# Patient Record
Sex: Female | Born: 1945 | Race: Black or African American | Hispanic: No | Smoking: Former smoker
Health system: Southern US, Community
[De-identification: ages and names within clinical notes are randomized; demographics above are authoritative.]

## PROBLEM LIST (undated history)

## (undated) DIAGNOSIS — I219 Acute myocardial infarction, unspecified: Secondary | ICD-10-CM

## (undated) DIAGNOSIS — I2510 Atherosclerotic heart disease of native coronary artery without angina pectoris: Secondary | ICD-10-CM

## (undated) DIAGNOSIS — I10 Essential (primary) hypertension: Secondary | ICD-10-CM

## (undated) DIAGNOSIS — I5020 Unspecified systolic (congestive) heart failure: Secondary | ICD-10-CM

## (undated) DIAGNOSIS — U071 COVID-19: Secondary | ICD-10-CM

## (undated) DIAGNOSIS — M359 Systemic involvement of connective tissue, unspecified: Secondary | ICD-10-CM

## (undated) DIAGNOSIS — R600 Localized edema: Secondary | ICD-10-CM

## (undated) DIAGNOSIS — I509 Heart failure, unspecified: Secondary | ICD-10-CM

## (undated) DIAGNOSIS — R0600 Dyspnea, unspecified: Secondary | ICD-10-CM

## (undated) DIAGNOSIS — K219 Gastro-esophageal reflux disease without esophagitis: Secondary | ICD-10-CM

## (undated) DIAGNOSIS — J45909 Unspecified asthma, uncomplicated: Secondary | ICD-10-CM

## (undated) DIAGNOSIS — M069 Rheumatoid arthritis, unspecified: Secondary | ICD-10-CM

## (undated) DIAGNOSIS — R7303 Prediabetes: Secondary | ICD-10-CM

## (undated) DIAGNOSIS — G4733 Obstructive sleep apnea (adult) (pediatric): Secondary | ICD-10-CM

## (undated) DIAGNOSIS — I700 Atherosclerosis of aorta: Secondary | ICD-10-CM

## (undated) HISTORY — DX: Heart failure, unspecified: I50.9

## (undated) HISTORY — PX: ESOPHAGOGASTRODUODENOSCOPY: SHX1529

## (undated) HISTORY — PX: TUBAL LIGATION: SHX77

## (undated) HISTORY — PX: COLONOSCOPY: SHX174

## (undated) HISTORY — PX: BREAST SURGERY: SHX581

## (undated) HISTORY — PX: CHOLECYSTECTOMY: SHX55

---

## 2004-09-28 ENCOUNTER — Ambulatory Visit: Payer: Self-pay | Admitting: Unknown Physician Specialty

## 2004-12-06 ENCOUNTER — Emergency Department: Payer: Self-pay | Admitting: Emergency Medicine

## 2005-03-03 ENCOUNTER — Emergency Department: Payer: Self-pay | Admitting: Emergency Medicine

## 2005-04-29 ENCOUNTER — Emergency Department: Payer: Self-pay | Admitting: Emergency Medicine

## 2005-04-29 ENCOUNTER — Other Ambulatory Visit: Payer: Self-pay

## 2005-06-21 ENCOUNTER — Ambulatory Visit: Payer: Self-pay | Admitting: Cardiovascular Disease

## 2005-06-21 HISTORY — PX: CARDIAC CATHETERIZATION: SHX172

## 2006-03-06 ENCOUNTER — Ambulatory Visit: Payer: Self-pay | Admitting: Family Medicine

## 2006-08-01 ENCOUNTER — Ambulatory Visit: Payer: Self-pay | Admitting: Rheumatology

## 2007-04-02 ENCOUNTER — Ambulatory Visit: Payer: Self-pay | Admitting: Specialist

## 2007-10-25 ENCOUNTER — Ambulatory Visit: Payer: Self-pay | Admitting: Family Medicine

## 2007-11-05 ENCOUNTER — Ambulatory Visit: Payer: Self-pay | Admitting: Specialist

## 2007-12-04 ENCOUNTER — Ambulatory Visit: Payer: Self-pay | Admitting: Unknown Physician Specialty

## 2008-05-15 ENCOUNTER — Ambulatory Visit: Payer: Self-pay | Admitting: Specialist

## 2008-05-19 ENCOUNTER — Inpatient Hospital Stay: Payer: Self-pay | Admitting: Internal Medicine

## 2008-05-19 ENCOUNTER — Other Ambulatory Visit: Payer: Self-pay

## 2008-05-20 HISTORY — PX: CARDIAC CATHETERIZATION: SHX172

## 2008-12-16 ENCOUNTER — Ambulatory Visit: Payer: Self-pay | Admitting: Family Medicine

## 2009-12-20 ENCOUNTER — Ambulatory Visit: Payer: Self-pay | Admitting: Family Medicine

## 2010-04-05 ENCOUNTER — Ambulatory Visit: Payer: Self-pay | Admitting: Cardiovascular Disease

## 2010-04-05 ENCOUNTER — Observation Stay: Payer: Self-pay | Admitting: Internal Medicine

## 2010-11-25 ENCOUNTER — Ambulatory Visit: Payer: Self-pay | Admitting: Family Medicine

## 2011-03-05 DEATH — deceased

## 2011-06-09 DIAGNOSIS — I10 Essential (primary) hypertension: Secondary | ICD-10-CM | POA: Insufficient documentation

## 2011-06-09 DIAGNOSIS — M069 Rheumatoid arthritis, unspecified: Secondary | ICD-10-CM | POA: Insufficient documentation

## 2011-06-09 DIAGNOSIS — J449 Chronic obstructive pulmonary disease, unspecified: Secondary | ICD-10-CM | POA: Insufficient documentation

## 2011-07-25 ENCOUNTER — Ambulatory Visit: Payer: Self-pay | Admitting: Family Medicine

## 2012-01-16 ENCOUNTER — Emergency Department: Payer: Self-pay | Admitting: *Deleted

## 2012-01-16 LAB — BASIC METABOLIC PANEL
Anion Gap: 11 (ref 7–16)
Calcium, Total: 8 mg/dL — ABNORMAL LOW (ref 8.5–10.1)
Creatinine: 0.93 mg/dL (ref 0.60–1.30)
EGFR (African American): >60
EGFR (Non-African Amer.): >60
Glucose: 137 mg/dL — ABNORMAL HIGH (ref 65–99)
Osmolality: 288 (ref 275–301)
Sodium: 143 mmol/L (ref 136–145)

## 2012-01-16 LAB — TROPONIN I: Troponin-I: <0.02 ng/mL

## 2012-01-16 LAB — CBC WITH DIFFERENTIAL/PLATELET
Basophil #: 0 10*3/uL (ref 0.0–0.1)
Basophil %: 0.4 %
Eosinophil %: 6 %
HGB: 13.3 g/dL (ref 12.0–16.0)
Lymphocyte #: 1.7 10*3/uL (ref 1.0–3.6)
MCH: 31.2 pg (ref 26.0–34.0)
MCHC: 33.8 g/dL (ref 32.0–36.0)
MCV: 92 fL (ref 80–100)
Monocyte #: 0.7 x10 3/mm (ref 0.2–0.9)
Monocyte %: 11.1 %
Platelet: 240 10*3/uL (ref 150–440)
RBC: 4.27 10*6/uL (ref 3.80–5.20)
RDW: 13.9 % (ref 11.5–14.5)

## 2012-01-16 LAB — URINALYSIS, COMPLETE
Glucose,UR: NEGATIVE mg/dL (ref 0–75)
Leukocyte Esterase: NEGATIVE
Nitrite: NEGATIVE
Ph: 6 (ref 4.5–8.0)
Protein: NEGATIVE
Specific Gravity: 1.009 (ref 1.003–1.030)
Squamous Epithelial: <1

## 2012-01-16 LAB — CK TOTAL AND CKMB (NOT AT ARMC): CK-MB: 1.6 ng/mL (ref 0.5–3.6)

## 2012-08-10 ENCOUNTER — Emergency Department: Payer: Self-pay | Admitting: Internal Medicine

## 2012-08-20 ENCOUNTER — Ambulatory Visit: Payer: Self-pay | Admitting: Family Medicine

## 2013-03-15 ENCOUNTER — Emergency Department: Payer: Self-pay | Admitting: Emergency Medicine

## 2013-03-15 LAB — COMPREHENSIVE METABOLIC PANEL
Albumin: 3.1 g/dL — ABNORMAL LOW (ref 3.4–5.0)
Alkaline Phosphatase: 108 U/L (ref 50–136)
Anion Gap: 4 — ABNORMAL LOW (ref 7–16)
Bilirubin,Total: 0.3 mg/dL (ref 0.2–1.0)
Chloride: 107 mmol/L (ref 98–107)
Creatinine: 1.05 mg/dL (ref 0.60–1.30)
EGFR (Non-African Amer.): 54 — ABNORMAL LOW
Osmolality: 285 (ref 275–301)
Potassium: 3.5 mmol/L (ref 3.5–5.1)
SGOT(AST): 18 U/L (ref 15–37)
SGPT (ALT): 16 U/L (ref 12–78)
Sodium: 141 mmol/L (ref 136–145)
Total Protein: 7.7 g/dL (ref 6.4–8.2)

## 2013-03-15 LAB — SEDIMENTATION RATE: Erythrocyte Sed Rate: 47 mm/hr — ABNORMAL HIGH (ref 0–30)

## 2013-03-15 LAB — TROPONIN I: Troponin-I: <0.02 ng/mL

## 2013-03-15 LAB — CBC
HCT: 38.9 % (ref 35.0–47.0)
HGB: 13.1 g/dL (ref 12.0–16.0)
MCH: 30.2 pg (ref 26.0–34.0)
MCV: 90 fL (ref 80–100)
Platelet: 246 10*3/uL (ref 150–440)
RBC: 4.33 10*6/uL (ref 3.80–5.20)
WBC: 6.5 10*3/uL (ref 3.6–11.0)

## 2013-03-15 LAB — CK TOTAL AND CKMB (NOT AT ARMC): CK-MB: 1.3 ng/mL (ref 0.5–3.6)

## 2013-07-09 ENCOUNTER — Ambulatory Visit: Payer: Self-pay | Admitting: Family Medicine

## 2014-05-21 DIAGNOSIS — R911 Solitary pulmonary nodule: Secondary | ICD-10-CM | POA: Insufficient documentation

## 2015-01-09 ENCOUNTER — Encounter: Payer: Self-pay | Admitting: *Deleted

## 2015-01-09 ENCOUNTER — Emergency Department: Payer: Medicare Other

## 2015-01-09 ENCOUNTER — Other Ambulatory Visit: Payer: Self-pay

## 2015-01-09 DIAGNOSIS — N3000 Acute cystitis without hematuria: Secondary | ICD-10-CM | POA: Insufficient documentation

## 2015-01-09 DIAGNOSIS — Z91040 Latex allergy status: Secondary | ICD-10-CM | POA: Diagnosis not present

## 2015-01-09 DIAGNOSIS — R200 Anesthesia of skin: Secondary | ICD-10-CM | POA: Diagnosis present

## 2015-01-09 DIAGNOSIS — R202 Paresthesia of skin: Secondary | ICD-10-CM | POA: Diagnosis not present

## 2015-01-09 DIAGNOSIS — M25512 Pain in left shoulder: Secondary | ICD-10-CM | POA: Diagnosis not present

## 2015-01-09 DIAGNOSIS — Z87891 Personal history of nicotine dependence: Secondary | ICD-10-CM | POA: Diagnosis not present

## 2015-01-09 DIAGNOSIS — I10 Essential (primary) hypertension: Secondary | ICD-10-CM | POA: Diagnosis not present

## 2015-01-09 DIAGNOSIS — M546 Pain in thoracic spine: Secondary | ICD-10-CM | POA: Diagnosis not present

## 2015-01-09 DIAGNOSIS — M542 Cervicalgia: Secondary | ICD-10-CM | POA: Diagnosis not present

## 2015-01-09 LAB — DIFFERENTIAL
Basophils Absolute: 0 10*3/uL (ref 0–0.1)
Basophils Relative: 1 %
Eosinophils Absolute: 0.3 10*3/uL (ref 0–0.7)
Eosinophils Relative: 5 %
LYMPHS ABS: 1.4 10*3/uL (ref 1.0–3.6)
Lymphocytes Relative: 25 %
MONO ABS: 0.6 10*3/uL (ref 0.2–0.9)
MONOS PCT: 10 %
NEUTROS ABS: 3.5 10*3/uL (ref 1.4–6.5)
NEUTROS PCT: 59 %

## 2015-01-09 LAB — URINALYSIS COMPLETE WITH MICROSCOPIC (ARMC ONLY)
Bilirubin Urine: NEGATIVE
Glucose, UA: NEGATIVE mg/dL
KETONES UR: NEGATIVE mg/dL
NITRITE: NEGATIVE
PROTEIN: 30 mg/dL — AB
SPECIFIC GRAVITY, URINE: 1.025 (ref 1.005–1.030)
pH: 5 (ref 5.0–8.0)

## 2015-01-09 LAB — APTT: aPTT: 28 seconds (ref 24–36)

## 2015-01-09 LAB — COMPREHENSIVE METABOLIC PANEL
ALBUMIN: 3.6 g/dL (ref 3.5–5.0)
ALT: 17 U/L (ref 14–54)
ANION GAP: 8 (ref 5–15)
AST: 21 U/L (ref 15–41)
Alkaline Phosphatase: 113 U/L (ref 38–126)
BILIRUBIN TOTAL: 0.4 mg/dL (ref 0.3–1.2)
BUN: 19 mg/dL (ref 6–20)
CALCIUM: 8.9 mg/dL (ref 8.9–10.3)
CO2: 27 mmol/L (ref 22–32)
Chloride: 104 mmol/L (ref 101–111)
Creatinine, Ser: 0.87 mg/dL (ref 0.44–1.00)
GFR calc Af Amer: >60 mL/min (ref >60)
GFR calc non Af Amer: >60 mL/min (ref >60)
Glucose, Bld: 122 mg/dL — ABNORMAL HIGH (ref 65–99)
Potassium: 3.7 mmol/L (ref 3.5–5.1)
Sodium: 139 mmol/L (ref 135–145)
Total Protein: 7.8 g/dL (ref 6.5–8.1)

## 2015-01-09 LAB — TROPONIN I: Troponin I: <0.03 ng/mL (ref <0.031)

## 2015-01-09 LAB — CBC
HCT: 40.2 % (ref 35.0–47.0)
Hemoglobin: 13.4 g/dL (ref 12.0–16.0)
MCH: 30.3 pg (ref 26.0–34.0)
MCHC: 33.3 g/dL (ref 32.0–36.0)
MCV: 90.9 fL (ref 80.0–100.0)
PLATELETS: 237 10*3/uL (ref 150–440)
RBC: 4.42 MIL/uL (ref 3.80–5.20)
RDW: 14.4 % (ref 11.5–14.5)
WBC: 5.8 10*3/uL (ref 3.6–11.0)

## 2015-01-09 LAB — PROTIME-INR
INR: 0.91
Prothrombin Time: 12.5 seconds (ref 11.4–15.0)

## 2015-01-09 NOTE — ED Notes (Signed)
Pt woke w/ L sided facial numbness x 4 days ago. Pt states numbness has been intermittent until today. Pt's sensation to light pressure intact, smile even, no tongue deviation, grips and push-pulls equal and strong. No evidence of neglect. Speech is clear and sensible.

## 2015-01-10 ENCOUNTER — Emergency Department
Admission: EM | Admit: 2015-01-10 | Discharge: 2015-01-10 | Disposition: A | Discharge status: Home / Self Care | Payer: Medicare Other | Attending: Emergency Medicine | Admitting: Emergency Medicine

## 2015-01-10 DIAGNOSIS — R202 Paresthesia of skin: Secondary | ICD-10-CM

## 2015-01-10 DIAGNOSIS — N3000 Acute cystitis without hematuria: Secondary | ICD-10-CM

## 2015-01-10 HISTORY — DX: Essential (primary) hypertension: I10

## 2015-01-10 HISTORY — DX: Unspecified asthma, uncomplicated: J45.909

## 2015-01-10 HISTORY — DX: Localized edema: R60.0

## 2015-01-10 HISTORY — DX: Gastro-esophageal reflux disease without esophagitis: K21.9

## 2015-01-10 MED ORDER — CIPROFLOXACIN HCL 500 MG PO TABS
ORAL_TABLET | ORAL | Status: AC
  Filled 2015-01-10: qty 1

## 2015-01-10 MED ORDER — CIPROFLOXACIN HCL 500 MG PO TABS
500.0000 mg | ORAL_TABLET | Freq: Once | ORAL | Status: AC
  Administered 2015-01-10: 500 mg via ORAL

## 2015-01-10 MED ORDER — CIPROFLOXACIN HCL 500 MG PO TABS: 500.0000 mg | ORAL_TABLET | Freq: Two times a day (BID) | ORAL | Status: AC

## 2015-01-10 NOTE — Discharge Instructions (Signed)
He did have a urinary tract infection. It is not clear if this is contributing to the numbness near face. The numbness in her face may be directly related to the discomfort in your neck and shoulder. You are neurologically intact on exam here and your CT scan looked okay. Take Cipro for urinary tract infection area follow-up with her regular doctor this coming week.  Paresthesia Paresthesia is an abnormal burning or prickling sensation. This sensation is generally felt in the hands, arms, legs, or feet. However, it may occur in any part of the body. It is usually not painful. The feeling may be described as:  Tingling or numbness.  "Pins and needles."  Skin crawling.  Buzzing.  Limbs "falling asleep."  Itching. Most people experience temporary (transient) paresthesia at some time in their lives. CAUSES  Paresthesia may occur when you breathe too quickly (hyperventilation). It can also occur without any apparent cause. Commonly, paresthesia occurs when pressure is placed on a nerve. The feeling quickly goes away once the pressure is removed. For some people, however, paresthesia is a long-lasting (chronic) condition caused by an underlying disorder. The underlying disorder may be:  A traumatic, direct injury to nerves. Examples include a:  Broken (fractured) neck.  Fractured skull.  A disorder affecting the brain and spinal cord (central nervous system). Examples include:  Transverse myelitis.  Encephalitis.  Transient ischemic attack.  Multiple sclerosis.  Stroke.  Tumor or blood vessel problems, such as an arteriovenous malformation pressing against the brain or spinal cord.  A condition that damages the peripheral nerves (peripheral neuropathy). Peripheral nerves are not part of the brain and spinal cord. These conditions include:  Diabetes.  Peripheral vascular disease.  Nerve entrapment syndromes, such as carpal tunnel  syndrome.  Shingles.  Hypothyroidism.  Vitamin B12 deficiencies.  Alcoholism.  Heavy metal poisoning (lead, arsenic).  Rheumatoid arthritis.  Systemic lupus erythematosus. DIAGNOSIS  Your caregiver will attempt to find the underlying cause of your paresthesia. Your caregiver may:  Take your medical history.  Perform a physical exam.  Order various lab tests.  Order imaging tests. TREATMENT  Treatment for paresthesia depends on the underlying cause. HOME CARE INSTRUCTIONS  Avoid drinking alcohol.  You may consider massage or acupuncture to help relieve your symptoms.  Keep all follow-up appointments as directed by your caregiver. SEEK IMMEDIATE MEDICAL CARE IF:   You feel weak.  You have trouble walking or moving.  You have problems with speech or vision.  You feel confused.  You cannot control your bladder or bowel movements.  You feel numbness after an injury.  You faint.  Your burning or prickling feeling gets worse when walking.  You have pain, cramps, or dizziness.  You develop a rash. MAKE SURE YOU:  Understand these instructions.  Will watch your condition.  Will get help right away if you are not doing well or get worse. Document Released: 08/11/2002 Document Revised: 11/13/2011 Document Reviewed: 05/12/2011 [facility] Patient Information 2015 [place], [place]. This information is not intended to replace advice given to you by your health care provider. Make sure you discuss any questions you have with your health care provider.

## 2015-01-10 NOTE — ED Notes (Signed)
Pt up ambulatory around room without difficulty. Pt continues to deny facial numbness at this time.

## 2015-01-10 NOTE — ED Provider Notes (Signed)
[facility] Emergency Department Provider Note  ____________________________________________  Time seen: 4 AM  I have reviewed the triage vital signs and the nursing notes.   HISTORY  Chief Complaint Numbness  neck pain, left facial numbness    HPI Tina Fields is a 69 y.o. female who presents complaining of a numbness in her face over the past few days. She reports that initially she was having some pain in her left neck and upper back. She denies any weakness in either face or extremities. There is no facial asymmetry per her report. She has not had experiences like this before.     Past Medical History  Diagnosis Date  . Hypertension   . Acid reflux   . Edema of both legs   . Asthma     There are no active problems to display for this patient.   History reviewed. No pertinent past surgical history.  Current Outpatient Rx  Name  Route  Sig  Dispense  Refill  . ciprofloxacin (CIPRO) 500 MG tablet   Oral   Take 1 tablet (500 mg total) by mouth 2 (two) times daily.   14 tablet   0     Allergies Latex  History reviewed. No pertinent family history.  Social History History  Substance Use Topics  . Smoking status: Former Smoker    Quit date: 01/09/1995  . Smokeless tobacco: Never Used  . Alcohol Use: No    Review of Systems Constitutional: Negative for fever. ENT: Negative for sore throat. Notable for numbness of the face. See history of present illness. Cardiovascular: Negative for chest pain. Respiratory: Negative for shortness of breath. Gastrointestinal: Negative for abdominal pain, vomiting and diarrhea. Genitourinary: Negative for dysuria. Musculoskeletal: Upper left back pain and left neck pain. Skin: Negative for rash. Neurological: Numbness of the face. See history of present illness area 10-point ROS otherwise negative.  ____________________________________________   PHYSICAL EXAM:  VITAL SIGNS: ED  Triage Vitals  Enc Vitals Group     BP 01/09/15 2049 147/74 mmHg     Pulse Rate 01/09/15 2049 76     Resp 01/09/15 2049 20     Temp 01/09/15 2049 98.2 F (36.8 C)     Temp Source 01/09/15 2049 Oral     SpO2 01/09/15 2049 96 %     Weight 01/09/15 2049 216 lb 9.6 oz (98.249 kg)     Height 01/09/15 2049 5\' 6"  (1.676 m)     Head Cir --      Peak Flow --      Pain Score 01/09/15 2050 7     Pain Loc --      Pain Edu? --      Excl. in GC? --     Constitutional: Alert and oriented. Well appearing and in no distress. ENT   Head: Normocephalic and atraumatic.   Nose: No congestion/rhinnorhea.   Mouth/Throat: Mucous membranes are moist. Cardiovascular: Normal rate, regular rhythm. Respiratory: Normal respiratory effort without tachypnea. Breath sounds are clear and equal bilaterally. No wheezes/rales/rhonchi. Gastrointestinal: Soft and nontender. No distention.  Back: There is no CVA tenderness. Musculoskeletal: Nontender with normal range of motion in all extremities.  No noted edema. Neurologic:  Normal speech and language. No gross focal neurologic deficits are appreciated. Cranial nerves II through XII are intact. Negative Romberg, negative pronator drift, normal finger to nose movement. Skin:  Skin is warm, dry. No rash noted. Psychiatric: Mood and affect are normal. Speech and behavior are normal.  ____________________________________________     LABS (pertinent positives/negatives)  Blood tests are unremarkable with white blood cell count 5.8. Electrolytes are normal with normal renal function. Urinalysis is abnormal with white blood cells too numerous to count.  ____________________________________________   EKG  EKG at 2105 shows normal sinus rhythm with one PVC at the beginning of the 12 second strip. Intervals are normal axis is normal. T waves are generally flat  ____________________________________________    RADIOLOGY  Head CT was ordered by the nurses  due to their concern for possible CVA. The CT does not show any acute finding. 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  ____________________________________________   PROCEDURES  Procedure(s) performed: None  Critical Care performed: No  ____________________________________________   INITIAL IMPRESSION / ASSESSMENT AND PLAN / ED COURSE  The patient has a notable urinary tract infection. It is unclear if this is contributing to the numbness in her face due to physiological stress or not. I do think that the numbness and pain in her left shoulder and neck are associated. She has a normal neurologic exam without any facial weakness. We will treat the urinary tract infection and have the patient follow up with her regular doctor at [facility] this week.  ____________________________________________   FINAL CLINICAL IMPRESSION(S) / ED DIAGNOSES  Final diagnoses:  Paresthesia  Acute cystitis without hematuria      Darien Ramus, MD 01/10/15 [phone]

## 2015-01-10 NOTE — ED Notes (Signed)
E signature not taking pt's signature. Pt verbalizes understanding of discharge instructions, prescription and follow up care.

## 2016-07-04 ENCOUNTER — Other Ambulatory Visit: Payer: Self-pay | Admitting: Family Medicine

## 2016-07-04 DIAGNOSIS — Z1239 Encounter for other screening for malignant neoplasm of breast: Secondary | ICD-10-CM

## 2016-08-09 ENCOUNTER — Ambulatory Visit: Payer: Medicare Other | Attending: Family Medicine

## 2016-10-02 ENCOUNTER — Ambulatory Visit
Admission: RE | Admit: 2016-10-02 | Discharge: 2016-10-02 | Disposition: A | Discharge status: Home / Self Care | Payer: Medicare Other | Source: Ambulatory Visit | Attending: Family Medicine | Admitting: Family Medicine

## 2016-10-02 ENCOUNTER — Other Ambulatory Visit: Payer: Self-pay | Admitting: Family Medicine

## 2016-10-02 DIAGNOSIS — R05 Cough: Secondary | ICD-10-CM | POA: Diagnosis present

## 2016-10-02 DIAGNOSIS — R059 Cough, unspecified: Secondary | ICD-10-CM

## 2016-10-02 DIAGNOSIS — I700 Atherosclerosis of aorta: Secondary | ICD-10-CM | POA: Diagnosis not present

## 2016-12-19 ENCOUNTER — Encounter: Payer: Self-pay | Admitting: *Deleted

## 2016-12-19 DIAGNOSIS — I10 Essential (primary) hypertension: Secondary | ICD-10-CM | POA: Insufficient documentation

## 2016-12-19 DIAGNOSIS — Z87891 Personal history of nicotine dependence: Secondary | ICD-10-CM | POA: Diagnosis not present

## 2016-12-19 DIAGNOSIS — J45909 Unspecified asthma, uncomplicated: Secondary | ICD-10-CM | POA: Diagnosis not present

## 2016-12-19 DIAGNOSIS — R200 Anesthesia of skin: Secondary | ICD-10-CM | POA: Diagnosis present

## 2016-12-19 DIAGNOSIS — Z79899 Other long term (current) drug therapy: Secondary | ICD-10-CM | POA: Insufficient documentation

## 2016-12-19 NOTE — ED Triage Notes (Signed)
Ambulatory to triage with c/o high blood pressure. Pt says this morning she had an episode of right hand cramping and numbness in the right arm that went through the mid forearm that lasted about 10 minutes. She took her BP and it was 180/90. She took it again this evening and it was 200's/112. Pt says that her physicians have been been changing around her BP meds (Last change to med yesterday). No CP or dizziness. Intermittent headache.

## 2016-12-20 ENCOUNTER — Telehealth: Payer: Self-pay | Admitting: Emergency Medicine

## 2016-12-20 ENCOUNTER — Emergency Department
Admission: EM | Admit: 2016-12-20 | Discharge: 2016-12-20 | Disposition: A | Discharge status: Home / Self Care | Payer: Medicare Other | Attending: Emergency Medicine | Admitting: Emergency Medicine

## 2016-12-20 DIAGNOSIS — I10 Essential (primary) hypertension: Secondary | ICD-10-CM

## 2016-12-20 LAB — BASIC METABOLIC PANEL
ANION GAP: 9 (ref 5–15)
BUN: 17 mg/dL (ref 6–20)
CALCIUM: 9.1 mg/dL (ref 8.9–10.3)
CO2: 25 mmol/L (ref 22–32)
CREATININE: 0.93 mg/dL (ref 0.44–1.00)
Chloride: 105 mmol/L (ref 101–111)
GFR calc non Af Amer: 60 mL/min — ABNORMAL LOW (ref >60)
Glucose, Bld: 182 mg/dL — ABNORMAL HIGH (ref 65–99)
Potassium: 3.5 mmol/L (ref 3.5–5.1)
SODIUM: 139 mmol/L (ref 135–145)

## 2016-12-20 LAB — URINALYSIS, COMPLETE (UACMP) WITH MICROSCOPIC
BILIRUBIN URINE: NEGATIVE
GLUCOSE, UA: NEGATIVE mg/dL
KETONES UR: NEGATIVE mg/dL
NITRITE: NEGATIVE
PH: 5 (ref 5.0–8.0)
Protein, ur: NEGATIVE mg/dL
SPECIFIC GRAVITY, URINE: 1.011 (ref 1.005–1.030)

## 2016-12-20 LAB — CBC
HCT: 38.9 % (ref 35.0–47.0)
Hemoglobin: 13.1 g/dL (ref 12.0–16.0)
MCH: 30.8 pg (ref 26.0–34.0)
MCHC: 33.7 g/dL (ref 32.0–36.0)
MCV: 91.2 fL (ref 80.0–100.0)
PLATELETS: 255 10*3/uL (ref 150–440)
RBC: 4.26 MIL/uL (ref 3.80–5.20)
RDW: 14.6 % — ABNORMAL HIGH (ref 11.5–14.5)
WBC: 7.2 10*3/uL (ref 3.6–11.0)

## 2016-12-20 LAB — TROPONIN I

## 2016-12-20 MED ORDER — CLONIDINE HCL 0.1 MG PO TABS: 0.1000 mg | ORAL_TABLET | Freq: Two times a day (BID) | ORAL | 0 refills | Status: DC | PRN

## 2016-12-20 NOTE — Telephone Encounter (Signed)
Called patient per dr sung request to inform her of urine result and need for antibiotics.  I called in keflex 250 mg po 3 times a day for 7 days to [facility] [address].

## 2016-12-20 NOTE — ED Notes (Signed)
Pt sitting in lobby with no distress noted, no c/o voiced; pt updated on wait times--voices good understanding; vs retaken

## 2016-12-20 NOTE — ED Provider Notes (Signed)
[facility] Emergency Department Provider Note   ____________________________________________   First MD Initiated Contact with Patient 12/20/16 [phone]     (approximate)  I have reviewed the triage vital signs and the nursing notes.   HISTORY  Chief Complaint Hypertension    HPI Tina Fields is a 71 y.o. female who presents to the ED from home with a chief complaint of elevated blood pressure. Patient has a history of high blood pressure. Tells me her cardiologist's took her off of one agent and increased another one back in September 2017. Her PCP added an agent but patient was unsure whether or not she should be taking all 3. Saw her PCP yesterday who did instruct her to take all 3 blood pressure agents. States her blood pressures have been fluctuating since 2017 and that it is not uncommon for her diastolic BP to be 110-120. 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 Felt numbness from her right hand to her right mid forearm. Denies headache, slurred speech, extremity weakness, confusion, vision changes associated with the events. Symptoms resolved after 10 minutes and she has not had recurrent symptoms. Denies fever, chills, headache, vision changes, neck pain, chest pain, shortness of breath, abdominal pain, nausea, vomiting, diarrhea. Denies recent travel or trauma.   Past Medical History:  Diagnosis Date  . Acid reflux   . Asthma   . Edema of both legs   . Hypertension     There are no active problems to display for this patient.   History reviewed. No pertinent surgical history.  Prior to Admission medications   Medication Sig Start Date End Date Taking? Authorizing Provider  diltiazem (CARDIZEM SR) 60 MG 12 hr capsule Take 60 mg by mouth 2 (two) times daily.   Yes Historical Provider, MD  losartan (COZAAR) 100 MG tablet Take 100 mg by mouth daily.   Yes  Historical Provider, MD  metoprolol succinate (TOPROL-XL) 25 MG 24 hr tablet Take 25 mg by mouth daily.   Yes Historical Provider, MD  cloNIDine (CATAPRES) 0.1 MG tablet Take 1 tablet (0.1 mg total) by mouth 2 (two) times daily as needed. 12/20/16   Irean Hong, MD    Allergies Azithromycin and Latex  No family history on file.  Social History Social History  Substance Use Topics  . Smoking status: Former Smoker    Quit date: 01/09/1995  . Smokeless tobacco: Never Used  . Alcohol use No    Review of Systems  Constitutional: No fever/chills. Eyes: No visual changes. ENT: No sore throat. Cardiovascular: Denies chest pain. Respiratory: Denies shortness of breath. Gastrointestinal: No abdominal pain.  No nausea, no vomiting.  No diarrhea.  No constipation. Genitourinary: Negative for dysuria. Musculoskeletal: Negative for back pain. Skin: Negative for rash. Neurological: Negative for headaches or focal weakness. Positive for right hand and mid forearm cramping/numbness.  10-point ROS otherwise negative.  ____________________________________________   PHYSICAL EXAM:  VITAL SIGNS: ED Triage Vitals  Enc Vitals Group     BP 12/19/16 2347 (!) 202/98     Pulse Rate 12/19/16 2347 86     Resp 12/19/16 2347 18     Temp 12/19/16 2347 98.7 F (37.1 C)     Temp Source 12/19/16 2347 Oral     SpO2 12/19/16 2347 97 %     Weight 12/19/16 2352 207 lb (93.9 kg)     Height 12/19/16 2352 5'  6" (1.676 m)     Head Circumference --      Peak Flow --      Pain Score 12/19/16 2352 2     Pain Loc --      Pain Edu? --      Excl. in GC? --     Constitutional: Alert and oriented. Well appearing and in no acute distress. Eyes: Conjunctivae are normal. PERRL. EOMI. Head: Atraumatic. Nose: No congestion/rhinnorhea. Mouth/Throat: Mucous membranes are moist.  Oropharynx non-erythematous. Neck: No stridor.  No carotid bruits. Cardiovascular: Normal rate, regular rhythm. Grossly normal heart  sounds.  Good peripheral circulation. Respiratory: Normal respiratory effort.  No retractions. Lungs CTAB. Gastrointestinal: Soft and nontender. No distention. No abdominal bruits. No CVA tenderness. Musculoskeletal: No lower extremity tenderness nor edema.  No joint effusions. Neurologic:  CN II-XII grossly intact. Normal speech and language. No gross focal neurologic deficits are appreciated. No gait instability. Skin:  Skin is warm, dry and intact. No rash noted. Psychiatric: Mood and affect are normal. Speech and behavior are normal.  ____________________________________________   LABS (all labs ordered are listed, but only abnormal results are displayed)  Labs Reviewed  BASIC METABOLIC PANEL - Abnormal; Notable for the following:       Result Value   Glucose, Bld 182 (*)    GFR calc non Af Amer 60 (*)    All other components within normal limits  CBC - Abnormal; Notable for the following:    RDW 14.6 (*)    All other components within normal limits  URINALYSIS, COMPLETE (UACMP) WITH MICROSCOPIC - Abnormal; Notable for the following:    Color, Urine YELLOW (*)    APPearance CLOUDY (*)    Hgb urine dipstick SMALL (*)    Leukocytes, UA LARGE (*)    Bacteria, UA RARE (*)    Squamous Epithelial / LPF 6-30 (*)    All other components within normal limits  URINE CULTURE  TROPONIN I   ____________________________________________  EKG  ED ECG REPORT I, SUNG,JADE J, the attending physician, personally viewed and interpreted this ECG.   Date: 12/20/2016  EKG Time: 2355  Rate: 83  Rhythm: normal EKG, normal sinus rhythm  Axis: Normal  Intervals:none  ST&T Change: Nonspecific  ____________________________________________  RADIOLOGY  None ____________________________________________   PROCEDURES  Procedure(s) performed: None  Procedures  Critical Care performed: No  ____________________________________________   INITIAL IMPRESSION / ASSESSMENT AND PLAN / ED  COURSE  Pertinent labs & imaging results that were available during my care of the patient were reviewed by me and considered in my medical decision making (see chart for details).  71 year old female who presents with labile blood pressures. Saw her PCP yesterday who clarified instructions for patient to take all 3 blood pressure agents. Patient does not know the names of her antihypertensives. She is inpatient for discharge as she had a long wait time. She will be notified of any positive urinalysis results. She is going to call back to the ED once she gets home to give [place] the names and dosages of her blood pressure agents. As long as she is not already on clonidine, have written her prescription for clonidine 0.1 mg twice daily as needed with clear instructions for use according to blood pressure measurements. I have also encouraged her to keep a blood pressure log and to take this to her doctor next week. She is neurologically intact without focal neurological deficits. Does not sound like her episode yesterday morning was a TIA  but rather hand cramping. Strict return precautions given. Patient verbalizes understanding and agrees with plan of care.  Clinical Course as of Dec 21 618  Wed Dec 20, 2016  0615 Patient call back with her medications which were recorded by the nurse (metoprolol, diltiazem and losartan). Reviewed urinalysis which demonstrates UTI. Urine culture added. Will have nurse navigator call the patient in the morning to notify her and to call in a prescription for Keflex.  [JS]    Clinical Course User Index [JS] Irean Hong, MD     ____________________________________________   FINAL CLINICAL IMPRESSION(S) / ED DIAGNOSES  Final diagnoses:  Essential hypertension      NEW MEDICATIONS STARTED DURING THIS VISIT:  Discharge Medication List as of 12/20/2016  4:09 AM    START taking these medications   Details  cloNIDine (CATAPRES) 0.1 MG tablet Take 1 tablet (0.1 mg  total) by mouth 2 (two) times daily as needed., Starting Wed 12/20/2016, Print         Note:  This document was prepared using Dragon voice recognition software and may include unintentional dictation errors.    Irean Hong, MD 12/20/16 [phone]

## 2016-12-20 NOTE — ED Notes (Signed)
Patient wanting to leave if her pressure is down and give the room to someone that needs it. Patient's pressure is still elevated. EDP aware.

## 2016-12-20 NOTE — Discharge Instructions (Signed)
1. Please call us back with the name and dosage of your blood pressure medicines. Call [phone]. I will then let you know if you can take Clonidine 0.1 mg twice daily as needed for blood pressure top number greater than 180 or bottom number greater than 100. 2. You will be notified of any positive urinalysis results. 3. Return to the ER for worsening symptoms, persistent vomiting, difficulty breathing or other concerns.

## 2017-01-10 ENCOUNTER — Ambulatory Visit: Payer: Medicare Other | Attending: Neurology

## 2017-06-26 ENCOUNTER — Encounter: Payer: Self-pay | Admitting: Emergency Medicine

## 2017-06-26 ENCOUNTER — Emergency Department: Payer: Medicare Other

## 2017-06-26 DIAGNOSIS — I10 Essential (primary) hypertension: Secondary | ICD-10-CM | POA: Insufficient documentation

## 2017-06-26 DIAGNOSIS — Y998 Other external cause status: Secondary | ICD-10-CM | POA: Insufficient documentation

## 2017-06-26 DIAGNOSIS — W01190A Fall on same level from slipping, tripping and stumbling with subsequent striking against furniture, initial encounter: Secondary | ICD-10-CM | POA: Diagnosis not present

## 2017-06-26 DIAGNOSIS — Y92009 Unspecified place in unspecified non-institutional (private) residence as the place of occurrence of the external cause: Secondary | ICD-10-CM | POA: Diagnosis not present

## 2017-06-26 DIAGNOSIS — S0990XA Unspecified injury of head, initial encounter: Secondary | ICD-10-CM | POA: Diagnosis present

## 2017-06-26 DIAGNOSIS — J45909 Unspecified asthma, uncomplicated: Secondary | ICD-10-CM | POA: Insufficient documentation

## 2017-06-26 DIAGNOSIS — Z87891 Personal history of nicotine dependence: Secondary | ICD-10-CM | POA: Diagnosis not present

## 2017-06-26 DIAGNOSIS — Z79899 Other long term (current) drug therapy: Secondary | ICD-10-CM | POA: Insufficient documentation

## 2017-06-26 DIAGNOSIS — Y9389 Activity, other specified: Secondary | ICD-10-CM | POA: Insufficient documentation

## 2017-06-26 DIAGNOSIS — Z91040 Latex allergy status: Secondary | ICD-10-CM | POA: Insufficient documentation

## 2017-06-26 DIAGNOSIS — S0003XA Contusion of scalp, initial encounter: Secondary | ICD-10-CM | POA: Diagnosis not present

## 2017-06-26 NOTE — ED Triage Notes (Signed)
Patient to ER after fall tonight in her kitchen. States she was sweeping and thought she saw a mouse. States she got frightened and tripped and fell. Patient c/o pain to back of head. Patient ambulatory to triage without difficulty. Denies LOC. States she takes ASA sometimes, but not regularly and no other blood thinners. Patient with no neuro deficits at this time.

## 2017-06-27 ENCOUNTER — Emergency Department
Admission: EM | Admit: 2017-06-27 | Discharge: 2017-06-27 | Disposition: A | Discharge status: Home / Self Care | Payer: Medicare Other | Attending: Emergency Medicine | Admitting: Emergency Medicine

## 2017-06-27 DIAGNOSIS — S0990XA Unspecified injury of head, initial encounter: Secondary | ICD-10-CM

## 2017-06-27 DIAGNOSIS — S0003XA Contusion of scalp, initial encounter: Secondary | ICD-10-CM

## 2017-06-27 NOTE — ED Provider Notes (Signed)
[facility] Emergency Department Provider Note   ____________________________________________   First MD Initiated Contact with Patient 06/27/17 0017     (approximate)  I have reviewed the triage vital signs and the nursing notes.   HISTORY  Chief Complaint Fall and Head Injury    HPI Tina Fields is a 71 y.o. female Who comes into the hospital today after a fall. The patient reports that she has a nylon bring him to catch his everything and she wasn't really paying much attention. She was sweeping up some wasps that had gotten into her home and she saw something come at her. She states it was probably just from the broom but she thought it was a mouse. The patient became frightened and turned. When she turned quickly she lost balance and fell and hit her head on a table. The patient reports that she had some head pain and some right hip pain. She reports that she thinks the hip pain is just from falling and she does not need any x-rays. She reports though that she couldn't get up immediately and she got a little dizzy so she decided to come in to get checked out. She reports that a friend of her friend fell and hit his head and then passed away while sleeping so she wanted to get checked out. The patient reports her pain as a 2 out of 10 in intensity. She did not take any pain medicine before coming in. She just wanted to get checked out.   Past Medical History:  Diagnosis Date  . Acid reflux   . Asthma   . Edema of both legs   . Hypertension     There are no active problems to display for this patient.   History reviewed. No pertinent surgical history.  Prior to Admission medications   Medication Sig Start Date End Date Taking? Authorizing Provider  cloNIDine (CATAPRES) 0.1 MG tablet Take 1 tablet (0.1 mg total) by mouth 2 (two) times daily as needed. 12/20/16   Irean Hong, MD  diltiazem (CARDIZEM SR) 60 MG 12 hr capsule Take 60 mg by mouth  2 (two) times daily.    [provider]  losartan (COZAAR) 100 MG tablet Take 100 mg by mouth daily.    [provider]  metoprolol succinate (TOPROL-XL) 25 MG 24 hr tablet Take 25 mg by mouth daily.    [provider]    Allergies Azithromycin and Latex  No family history on file.  Social History Social History  Substance Use Topics  . Smoking status: Former Smoker    Quit date: 01/09/1995  . Smokeless tobacco: Never Used  . Alcohol use No    Review of Systems  Constitutional: No fever/chills Eyes: No visual changes. ENT: No sore throat. Cardiovascular: Denies chest pain. Respiratory: Denies shortness of breath. Gastrointestinal: No abdominal pain.  No nausea, no vomiting.  No diarrhea.  No constipation. Genitourinary: Negative for dysuria. Musculoskeletal: right hip pain Skin: Negative for rash. Neurological: headache, dizziness   ____________________________________________   PHYSICAL EXAM:  VITAL SIGNS: ED Triage Vitals  Enc Vitals Group     BP 06/26/17 2121 (!) 140/93     Pulse Rate 06/26/17 2121 74     Resp 06/26/17 2121 18     Temp 06/26/17 2121 98.1 F (36.7 C)     Temp Source 06/26/17 2121 Oral     SpO2 06/26/17 2121 96 %     Weight 06/26/17 2125 198  lb (89.8 kg)     Height 06/26/17 2125 5\' 6"  (1.676 m)     Head Circumference --      Peak Flow --      Pain Score 06/26/17 2121 8     Pain Loc --      Pain Edu? --      Excl. in GC? --     Constitutional: Alert and oriented. Well appearing and in mild distress. Eyes: Conjunctivae are normal. PERRL. EOMI. Head: small contusion to right occiput. Nose: No congestion/rhinnorhea. Mouth/Throat: Mucous membranes are moist.  Oropharynx non-erythematous. Cardiovascular: Normal rate, regular rhythm. Grossly normal heart sounds.  Good peripheral circulation. Respiratory: Normal respiratory effort.  No retractions. Lungs CTAB. Gastrointestinal: Soft and nontender. No distention.  Positive bowel sounds Musculoskeletal: No lower extremity tenderness nor edema.   Neurologic:  Normal speech and language.  Skin:  Skin is warm, dry and intact.  Psychiatric: Mood and affect are normal.   ____________________________________________   LABS (all labs ordered are listed, but only abnormal results are displayed)  Labs Reviewed - No data to display ____________________________________________  EKG  none ____________________________________________  RADIOLOGY  Ct Head Wo Contrast  Result Date: 06/26/2017 CLINICAL DATA:  Posterior headache after falling tonight in her kitchen. EXAM: CT HEAD WITHOUT CONTRAST CT CERVICAL SPINE WITHOUT CONTRAST TECHNIQUE: Multidetector CT imaging of the head and cervical spine was performed following the standard protocol without intravenous contrast. Multiplanar CT image reconstructions of the cervical spine were also generated. COMPARISON:  Head CT dated 01/09/2015. FINDINGS: CT HEAD FINDINGS Brain: Diffusely enlarged ventricles and subarachnoid spaces. Patchy white matter low density in both cerebral hemispheres. No intracranial hemorrhage, mass lesion or CT evidence of acute infarction. Vascular: No hyperdense vessel or unexpected calcification. Skull: Normal. Negative for fracture or focal lesion. Sinuses/Orbits: Unremarkable. Other: Small right posterior scalp hematoma. CT CERVICAL SPINE FINDINGS Alignment: Reversal of the normal cervical lordosis. No subluxations. Skull base and vertebrae: No acute fracture. No primary bone lesion or focal pathologic process. Soft tissues and spinal canal: No prevertebral fluid or swelling. No visible canal hematoma. Disc levels: Multilevel degenerative changes, including the C1-2 level with stable partially fragmented spur formation. Upper chest: Clear lung apices. Other: Bilateral carotid artery calcifications. Bilateral temporomandibular joint degenerative changes. IMPRESSION: 1. Small right posterior scalp  hematoma without skull fracture or intracranial hemorrhage. 2. No cervical spine fracture or subluxation. 3. Mild diffuse cerebral and cerebellar atrophy with mild progression. 4. Moderate chronic small vessel white matter ischemic changes in both cerebral hemispheres with mild progression. 5. Reversal the normal cervical lordosis and multilevel cervical spine degenerative changes. 6. Bilateral carotid artery atheromatous calcifications. Electronically Signed   By: 03/11/2015 M.D.   On: 06/26/2017 22:00   Ct Cervical Spine Wo Contrast  Result Date: 06/26/2017 CLINICAL DATA:  Posterior headache after falling tonight in her kitchen. EXAM: CT HEAD WITHOUT CONTRAST CT CERVICAL SPINE WITHOUT CONTRAST TECHNIQUE: Multidetector CT imaging of the head and cervical spine was performed following the standard protocol without intravenous contrast. Multiplanar CT image reconstructions of the cervical spine were also generated. COMPARISON:  Head CT dated 01/09/2015. FINDINGS: CT HEAD FINDINGS Brain: Diffusely enlarged ventricles and subarachnoid spaces. Patchy white matter low density in both cerebral hemispheres. No intracranial hemorrhage, mass lesion or CT evidence of acute infarction. Vascular: No hyperdense vessel or unexpected calcification. Skull: Normal. Negative for fracture or focal lesion. Sinuses/Orbits: Unremarkable. Other: Small right posterior scalp hematoma. CT CERVICAL SPINE FINDINGS Alignment: Reversal of the normal cervical lordosis.  No subluxations. Skull base and vertebrae: No acute fracture. No primary bone lesion or focal pathologic process. Soft tissues and spinal canal: No prevertebral fluid or swelling. No visible canal hematoma. Disc levels: Multilevel degenerative changes, including the C1-2 level with stable partially fragmented spur formation. Upper chest: Clear lung apices. Other: Bilateral carotid artery calcifications. Bilateral temporomandibular joint degenerative changes. IMPRESSION: 1.  Small right posterior scalp hematoma without skull fracture or intracranial hemorrhage. 2. No cervical spine fracture or subluxation. 3. Mild diffuse cerebral and cerebellar atrophy with mild progression. 4. Moderate chronic small vessel white matter ischemic changes in both cerebral hemispheres with mild progression. 5. Reversal the normal cervical lordosis and multilevel cervical spine degenerative changes. 6. Bilateral carotid artery atheromatous calcifications. Electronically Signed   By: Beckie Salts M.D.   On: 06/26/2017 22:00    ____________________________________________   PROCEDURES  Procedure(s) performed: None  Procedures  Critical Care performed: No  ____________________________________________   INITIAL IMPRESSION / ASSESSMENT AND PLAN / ED COURSE  As part of my medical decision making, I reviewed the following data within the electronic MEDICAL RECORD NUMBER Notes from prior ED visits and [place] Controlled Substance Database   This is a 71 year old female who comes into the hospital today after a fall. The patient was concerned and wanted to get checked out.  My differential diagnosis is contusion, concussion, skull fracture, intracranial hemorrhage.  We did send the patient for a CT scan of her head and cervical spine. The patient has a small right posterior scalp hematoma but doesn't have any fracture or intracranial hemorrhage. The patient reports her pain as a 2 out of 10 at this time she feels well. I did ask her about her hip and she reports that it does not hurt very much and she does not want [place] to perform an x-ray. As the patient's scans are unremarkable I will discharge her to home to have her follow-up with her primary care physician. The patient a further questions at this time. She will be discharged home.      ____________________________________________   FINAL CLINICAL IMPRESSION(S) / ED DIAGNOSES  Final diagnoses:  Injury of head, initial encounter    Contusion of scalp, initial encounter      NEW MEDICATIONS STARTED DURING THIS VISIT:  New Prescriptions   No medications on file     Note:  This document was prepared using Dragon voice recognition software and may include unintentional dictation errors.    Rebecka Apley, MD 06/27/17 [phone]

## 2017-06-27 NOTE — Discharge Instructions (Signed)
Please follow up with your primary care physician.

## 2017-08-20 ENCOUNTER — Other Ambulatory Visit: Payer: Self-pay

## 2017-08-20 ENCOUNTER — Emergency Department: Payer: Medicare Other

## 2017-08-20 ENCOUNTER — Encounter: Payer: Self-pay | Admitting: Emergency Medicine

## 2017-08-20 ENCOUNTER — Observation Stay
Admission: EM | Admit: 2017-08-20 | Discharge: 2017-08-21 | Disposition: A | Discharge status: Home / Self Care | Payer: Medicare Other | Attending: Specialist | Admitting: Specialist

## 2017-08-20 DIAGNOSIS — Z87891 Personal history of nicotine dependence: Secondary | ICD-10-CM | POA: Insufficient documentation

## 2017-08-20 DIAGNOSIS — I2510 Atherosclerotic heart disease of native coronary artery without angina pectoris: Secondary | ICD-10-CM | POA: Diagnosis not present

## 2017-08-20 DIAGNOSIS — K219 Gastro-esophageal reflux disease without esophagitis: Secondary | ICD-10-CM | POA: Insufficient documentation

## 2017-08-20 DIAGNOSIS — G3189 Other specified degenerative diseases of nervous system: Secondary | ICD-10-CM | POA: Insufficient documentation

## 2017-08-20 DIAGNOSIS — Z79899 Other long term (current) drug therapy: Secondary | ICD-10-CM | POA: Diagnosis not present

## 2017-08-20 DIAGNOSIS — I6523 Occlusion and stenosis of bilateral carotid arteries: Secondary | ICD-10-CM | POA: Diagnosis not present

## 2017-08-20 DIAGNOSIS — I6502 Occlusion and stenosis of left vertebral artery: Secondary | ICD-10-CM | POA: Diagnosis not present

## 2017-08-20 DIAGNOSIS — M069 Rheumatoid arthritis, unspecified: Secondary | ICD-10-CM | POA: Diagnosis not present

## 2017-08-20 DIAGNOSIS — E1151 Type 2 diabetes mellitus with diabetic peripheral angiopathy without gangrene: Secondary | ICD-10-CM | POA: Insufficient documentation

## 2017-08-20 DIAGNOSIS — Z8249 Family history of ischemic heart disease and other diseases of the circulatory system: Secondary | ICD-10-CM | POA: Diagnosis not present

## 2017-08-20 DIAGNOSIS — J45909 Unspecified asthma, uncomplicated: Secondary | ICD-10-CM | POA: Diagnosis not present

## 2017-08-20 DIAGNOSIS — Z91040 Latex allergy status: Secondary | ICD-10-CM | POA: Diagnosis not present

## 2017-08-20 DIAGNOSIS — I10 Essential (primary) hypertension: Secondary | ICD-10-CM | POA: Diagnosis not present

## 2017-08-20 DIAGNOSIS — Z7982 Long term (current) use of aspirin: Secondary | ICD-10-CM | POA: Diagnosis not present

## 2017-08-20 DIAGNOSIS — Z881 Allergy status to other antibiotic agents status: Secondary | ICD-10-CM | POA: Insufficient documentation

## 2017-08-20 DIAGNOSIS — G459 Transient cerebral ischemic attack, unspecified: Secondary | ICD-10-CM

## 2017-08-20 DIAGNOSIS — R600 Localized edema: Secondary | ICD-10-CM | POA: Insufficient documentation

## 2017-08-20 DIAGNOSIS — I959 Hypotension, unspecified: Secondary | ICD-10-CM | POA: Diagnosis present

## 2017-08-20 DIAGNOSIS — R29818 Other symptoms and signs involving the nervous system: Secondary | ICD-10-CM

## 2017-08-20 HISTORY — DX: Rheumatoid arthritis, unspecified: M06.9

## 2017-08-20 HISTORY — DX: Systemic involvement of connective tissue, unspecified: M35.9

## 2017-08-20 LAB — DIFFERENTIAL
BASOS ABS: 0.1 10*3/uL (ref 0–0.1)
BASOS PCT: 1 %
Eosinophils Absolute: 0.2 10*3/uL (ref 0–0.7)
Eosinophils Relative: 3 %
Lymphocytes Relative: 30 %
Lymphs Abs: 2 10*3/uL (ref 1.0–3.6)
MONOS PCT: 10 %
Monocytes Absolute: 0.7 10*3/uL (ref 0.2–0.9)
NEUTROS ABS: 3.8 10*3/uL (ref 1.4–6.5)
Neutrophils Relative %: 56 %

## 2017-08-20 LAB — COMPREHENSIVE METABOLIC PANEL
ALT: 13 U/L — AB (ref 14–54)
AST: 19 U/L (ref 15–41)
Albumin: 3.3 g/dL — ABNORMAL LOW (ref 3.5–5.0)
Alkaline Phosphatase: 82 U/L (ref 38–126)
Anion gap: 10 (ref 5–15)
BUN: 20 mg/dL (ref 6–20)
CHLORIDE: 105 mmol/L (ref 101–111)
CO2: 24 mmol/L (ref 22–32)
CREATININE: 1.09 mg/dL — AB (ref 0.44–1.00)
Calcium: 8.7 mg/dL — ABNORMAL LOW (ref 8.9–10.3)
GFR calc Af Amer: 57 mL/min — ABNORMAL LOW (ref >60)
GFR calc non Af Amer: 49 mL/min — ABNORMAL LOW (ref >60)
GLUCOSE: 115 mg/dL — AB (ref 65–99)
Potassium: 3.3 mmol/L — ABNORMAL LOW (ref 3.5–5.1)
SODIUM: 139 mmol/L (ref 135–145)
Total Bilirubin: 0.7 mg/dL (ref 0.3–1.2)
Total Protein: 7.6 g/dL (ref 6.5–8.1)

## 2017-08-20 LAB — TROPONIN I: Troponin I: <0.03 ng/mL (ref <0.03)

## 2017-08-20 LAB — CBC
HEMATOCRIT: 39.5 % (ref 35.0–47.0)
Hemoglobin: 13.1 g/dL (ref 12.0–16.0)
MCH: 30.7 pg (ref 26.0–34.0)
MCHC: 33.2 g/dL (ref 32.0–36.0)
MCV: 92.7 fL (ref 80.0–100.0)
PLATELETS: 247 10*3/uL (ref 150–440)
RBC: 4.26 MIL/uL (ref 3.80–5.20)
RDW: 14.2 % (ref 11.5–14.5)
WBC: 6.7 10*3/uL (ref 3.6–11.0)

## 2017-08-20 LAB — PROTIME-INR
INR: 1
Prothrombin Time: 13.1 seconds (ref 11.4–15.2)

## 2017-08-20 LAB — APTT: APTT: 28 s (ref 24–36)

## 2017-08-20 MED ORDER — ASPIRIN 81 MG PO CHEW
324.0000 mg | CHEWABLE_TABLET | Freq: Once | ORAL | Status: AC
  Administered 2017-08-20: 324 mg via ORAL
  Filled 2017-08-20: qty 4

## 2017-08-20 NOTE — ED Notes (Signed)
Pt to CT via w/c accomp by nurse

## 2017-08-20 NOTE — ED Notes (Signed)
Ret to room; placed in hosp gown & on card monitor for further eval; report given to Dr Jacqualine Code

## 2017-08-20 NOTE — ED Notes (Signed)
Tele neuro initiated at bedside per Dr Fanny Bien

## 2017-08-20 NOTE — ED Triage Notes (Addendum)
Patient ambulatory to triage with steady gait, without difficulty or distress noted; pt reports at home, taking clothes out of washer and had onset left hand numbness that resolved and numbness to left side of face remains; denies hx of same, denies any c/o pain;A&Ox3, PERRL, speech clear, MAEW, grips + & strong

## 2017-08-20 NOTE — ED Notes (Signed)
Assisted patient to the restroom.  

## 2017-08-20 NOTE — Consult Note (Signed)
[facility] TeleNeurology Consult Services  Asked to see this patient in telemedicine consultation. Consultation was performed with assistance of ancillary/medical staff at bedside.  Comments: Last Known normal 2100 Door Time: 2117 [facility] Contacted: 2155 [facility] first log in: 2202 NIHSS assessment time: 2207 Needle Time: no iv tpa Call back time: 2208  HPI:  74 yof with hx of hypertension, CAD, peripheral edema in lower extremities presents to ED with concern for stroke sxs. She took a nap and woke up around 2100 to do some chores.  She developed numbness of her face and hand on the left. She also complains of mild headache as well.  She denies chest pain, vision change, altered speech, or weakness. CT scan head was negative for acute process. Of note, she takes asa but every other day.  VSS  Gen Wn/Wd in Nad  TeleStroke Assessment: LOC:   0 LOC questions:  0 LOC Commands :   0 Gaze : 0 Visual fields :  0  Facial movements : 0 Upper limb Motor  0 Lower limb Motor  0 Limb Coordination  - 0 Sensory -  - 0 Language -  0 Speech -   0 Neglect / extinction -  0  NIHSS Score: 0    IMPRESSION  TIA RO Stroke   Blood Pressure and Blood Glucose within acceptable parameters..   Medical Decision Making:   Patient is not candidate for alteplase due to non focal / localizing exam  Not an IR candidate as low clinical suspicion for LVO by neurologic assessment.   Recommendations: 1- Daily antithrombotics to initiate now if no contraindication.  2- Further work up with Stroke labs, MRI brain, ECHO, NIVS Carotid will be deferred to inpt neurology service 3-  Needs Inpatient Neurology consultation and follow up  4- Thank you for allowing [place] to participate in the care of your patient, if there are any questions please don't hesitate to contact us  Discussed plan of care with patient/family/hospital staff   Physician: Terrace Arabia, DO    [facility]

## 2017-08-21 ENCOUNTER — Observation Stay: Payer: Medicare Other

## 2017-08-21 ENCOUNTER — Other Ambulatory Visit: Payer: Self-pay

## 2017-08-21 ENCOUNTER — Encounter: Payer: Self-pay | Admitting: Internal Medicine

## 2017-08-21 DIAGNOSIS — G459 Transient cerebral ischemic attack, unspecified: Secondary | ICD-10-CM

## 2017-08-21 DIAGNOSIS — I959 Hypotension, unspecified: Secondary | ICD-10-CM | POA: Diagnosis present

## 2017-08-21 DIAGNOSIS — K219 Gastro-esophageal reflux disease without esophagitis: Secondary | ICD-10-CM | POA: Diagnosis present

## 2017-08-21 DIAGNOSIS — I10 Essential (primary) hypertension: Secondary | ICD-10-CM | POA: Diagnosis present

## 2017-08-21 DIAGNOSIS — R29818 Other symptoms and signs involving the nervous system: Secondary | ICD-10-CM | POA: Diagnosis present

## 2017-08-21 LAB — CBC
HEMATOCRIT: 37.8 % (ref 35.0–47.0)
Hemoglobin: 12.8 g/dL (ref 12.0–16.0)
MCH: 31.3 pg (ref 26.0–34.0)
MCHC: 33.7 g/dL (ref 32.0–36.0)
MCV: 92.9 fL (ref 80.0–100.0)
Platelets: 217 10*3/uL (ref 150–440)
RBC: 4.07 MIL/uL (ref 3.80–5.20)
RDW: 14.3 % (ref 11.5–14.5)
WBC: 6.2 10*3/uL (ref 3.6–11.0)

## 2017-08-21 LAB — LIPID PANEL
CHOLESTEROL: 174 mg/dL (ref 0–200)
HDL: 28 mg/dL — AB (ref >40)
LDL Cholesterol: 108 mg/dL — ABNORMAL HIGH (ref 0–99)
TRIGLYCERIDES: 190 mg/dL — AB (ref <150)
Total CHOL/HDL Ratio: 6.2 RATIO
VLDL: 38 mg/dL (ref 0–40)

## 2017-08-21 LAB — CREATININE, SERUM
Creatinine, Ser: 0.85 mg/dL (ref 0.44–1.00)
GFR calc Af Amer: >60 mL/min (ref >60)
GFR calc non Af Amer: >60 mL/min (ref >60)

## 2017-08-21 MED ORDER — STROKE: EARLY STAGES OF RECOVERY BOOK
Freq: Once | Status: AC
  Administered 2017-08-21: 03:00:00

## 2017-08-21 MED ORDER — ACETAMINOPHEN 325 MG PO TABS: 650.0000 mg | ORAL_TABLET | ORAL | Status: DC | PRN

## 2017-08-21 MED ORDER — ACETAMINOPHEN 650 MG RE SUPP: 650.0000 mg | RECTAL | Status: DC | PRN

## 2017-08-21 MED ORDER — PANTOPRAZOLE SODIUM 40 MG PO TBEC
40.0000 mg | DELAYED_RELEASE_TABLET | Freq: Two times a day (BID) | ORAL | Status: DC
  Administered 2017-08-21 (×2): 40 mg via ORAL
  Filled 2017-08-21 (×2): qty 1

## 2017-08-21 MED ORDER — ASPIRIN EC 81 MG PO TBEC: 81.0000 mg | DELAYED_RELEASE_TABLET | Freq: Every day | ORAL | 1 refills | Status: DC

## 2017-08-21 MED ORDER — METOPROLOL SUCCINATE ER 25 MG PO TB24
25.0000 mg | ORAL_TABLET | Freq: Every day | ORAL | Status: DC
  Administered 2017-08-21: 25 mg via ORAL
  Filled 2017-08-21: qty 1

## 2017-08-21 MED ORDER — DILTIAZEM HCL ER 60 MG PO CP12
60.0000 mg | ORAL_CAPSULE | Freq: Two times a day (BID) | ORAL | Status: DC
  Administered 2017-08-21: 60 mg via ORAL
  Filled 2017-08-21 (×2): qty 1

## 2017-08-21 MED ORDER — IOPAMIDOL (ISOVUE-370) INJECTION 76%
75.0000 mL | Freq: Once | INTRAVENOUS | Status: AC | PRN
  Administered 2017-08-21: 75 mL via INTRAVENOUS

## 2017-08-21 MED ORDER — POTASSIUM CHLORIDE 20 MEQ PO PACK
40.0000 meq | PACK | Freq: Once | ORAL | Status: DC
  Filled 2017-08-21: qty 2

## 2017-08-21 MED ORDER — LOSARTAN POTASSIUM 50 MG PO TABS
100.0000 mg | ORAL_TABLET | Freq: Every day | ORAL | Status: DC
  Administered 2017-08-21: 100 mg via ORAL
  Filled 2017-08-21: qty 2

## 2017-08-21 MED ORDER — ACETAMINOPHEN 160 MG/5ML PO SOLN
650.0000 mg | ORAL | Status: DC | PRN
  Filled 2017-08-21: qty 20.3

## 2017-08-21 MED ORDER — ENOXAPARIN SODIUM 40 MG/0.4ML ~~LOC~~ SOLN
40.0000 mg | SUBCUTANEOUS | Status: DC
  Administered 2017-08-21: 03:00:00 40 mg via SUBCUTANEOUS
  Filled 2017-08-21: qty 0.4

## 2017-08-21 MED ORDER — POTASSIUM CHLORIDE CRYS ER 20 MEQ PO TBCR
40.0000 meq | EXTENDED_RELEASE_TABLET | Freq: Once | ORAL | Status: AC
  Administered 2017-08-21: 40 meq via ORAL
  Filled 2017-08-21: qty 2

## 2017-08-21 NOTE — Progress Notes (Signed)
OT Cancellation Note  Patient Details Name: Jakaiya Netherland MRN: [id number] DOB: 09-15-1945   Cancelled Treatment:    Reason Eval/Treat Not Completed: Patient at procedure or test/ unavailable. On 2nd attempt, pt out of room for testing again. Will re-attempt at later time as pt is available.   Richrd Prime, MPH, MS, OTR/L ascom [phone] 08/21/17, 11:58 AM

## 2017-08-21 NOTE — Discharge Summary (Signed)
[facility] - [place] at [facility]   PATIENT NAME: Tina Fields    MR#:  [id number]  DATE OF BIRTH:  [birth year]  DATE OF ADMISSION:  08/20/2017 ADMITTING PHYSICIAN: Oralia Manis, MD  DATE OF DISCHARGE: 08/21/2017  PRIMARY CARE PHYSICIAN: Hillery Aldo, MD    ADMISSION DIAGNOSIS:  TIA (transient ischemic attack) [G45.9]  DISCHARGE DIAGNOSIS:  Principal Problem:   Focal neurological deficit Active Problems:   HTN (hypertension)   GERD (gastroesophageal reflux disease)   SECONDARY DIAGNOSIS:   Past Medical History:  Diagnosis Date  . Acid reflux   . Asthma   . Edema of both legs   . Hypertension   . RA (rheumatoid arthritis) [facility])     HOSPITAL COURSE:   71 year old female with past medical history of hypertension, rheumatoid arthritis, asthma, GERD who presented to the hospital due to right upper extremity numbness and tingling.  1. TIA/CVA-this was a working diagnosis given patient's transient neurologic symptoms of right upper extremity numbness and tingling which had subsequently resolved prior to admission. Patient underwent extensive neurologic workup in the hospital including CT head, MRI/MRA of the brain which were negative for any acute pathology. A carotid duplex also shows no evidence of hemodynamically significant carotid artery stenosis. -Patient is being discharged on a baby aspirin daily. She does not require any further services. She has no clinical symptoms. Next  2. Essential hypertension-patient will continue her Cardizem, metoprolol, losartan.  3. GERD-patient will continue omeprazole.  DISCHARGE CONDITIONS:   Stable.   CONSULTS OBTAINED:  Treatment Team:  Thana Farr, MD  DRUG ALLERGIES:   Allergies  Allergen Reactions  . Azithromycin Rash  . Latex Hives    DISCHARGE MEDICATIONS:   Allergies as of 08/21/2017      Reactions   Azithromycin Rash   Latex Hives      Medication List    TAKE these medications    aspirin EC 81 MG tablet Take 1 tablet (81 mg total) by mouth daily.   diltiazem 60 MG 12 hr capsule Commonly known as:  CARDIZEM SR Take 60 mg by mouth 2 (two) times daily.   fluticasone 50 MCG/ACT nasal spray Commonly known as:  FLONASE Place 2 sprays into both nostrils daily as needed for congestion.   losartan 100 MG tablet Commonly known as:  COZAAR Take 100 mg by mouth daily.   metoprolol succinate 25 MG 24 hr tablet Commonly known as:  TOPROL-XL Take 25 mg by mouth daily.   omeprazole 20 MG capsule Commonly known as:  PRILOSEC Take 20 mg by mouth 2 (two) times daily.         DISCHARGE INSTRUCTIONS:   DIET:  Cardiac diet  DISCHARGE CONDITION:  Stable  ACTIVITY:  Activity as tolerated  OXYGEN:  Home Oxygen: No.   Oxygen Delivery: room air  DISCHARGE LOCATION:  home   If you experience worsening of your admission symptoms, develop shortness of breath, life threatening emergency, suicidal or homicidal thoughts you must seek medical attention immediately by calling 911 or calling your MD immediately  if symptoms less severe.  You Must read complete instructions/literature along with all the possible adverse reactions/side effects for all the Medicines you take and that have been prescribed to you. Take any new Medicines after you have completely understood and accpet all the possible adverse reactions/side effects.   Please note  You were cared for by a hospitalist during your hospital stay. If you have any questions about your discharge medications or the  care you received while you were in the hospital after you are discharged, you can call the unit and asked to speak with the hospitalist on call if the hospitalist that took care of you is not available. Once you are discharged, your primary care physician will handle any further medical issues. Please note that NO REFILLS for any discharge medications will be authorized once you are discharged, as it is  imperative that you return to your primary care physician (or establish a relationship with a primary care physician if you do not have one) for your aftercare needs so that they can reassess your need for medications and monitor your lab values.     Today   No acute complaints presently. Numbness resolved and no focal neuro deficits.   VITAL SIGNS:  Blood pressure (!) 184/80, pulse 68, temperature 97.7 F (36.5 C), temperature source Oral, resp. rate 18, height 5\' 6"  (1.676 m), weight 92.5 kg (203 lb 14.4 oz), SpO2 96 %.  I/O:    Intake/Output Summary (Last 24 hours) at 08/21/2017 1431 Last data filed at 08/21/2017 1300 Gross per 24 hour  Intake 360 ml  Output -  Net 360 ml    PHYSICAL EXAMINATION:  GENERAL:  71 y.o.-year-old patient lying in the bed with no acute distress.  EYES: Pupils equal, round, reactive to light and accommodation. No scleral icterus. Extraocular muscles intact.  HEENT: Head atraumatic, normocephalic. Oropharynx and nasopharynx clear.  NECK:  Supple, no jugular venous distention. No thyroid enlargement, no tenderness.  LUNGS: Normal breath sounds bilaterally, no wheezing, rales,rhonchi. No use of accessory muscles of respiration.  CARDIOVASCULAR: S1, S2 normal. No murmurs, rubs, or gallops.  ABDOMEN: Soft, non-tender, non-distended. Bowel sounds present. No organomegaly or mass.  EXTREMITIES: No pedal edema, cyanosis, or clubbing.  NEUROLOGIC: Cranial nerves II through XII are intact. No focal motor or sensory defecits b/l.  PSYCHIATRIC: The patient is alert and oriented x 3. Good affect.  SKIN: No obvious rash, lesion, or ulcer.   DATA REVIEW:   CBC Recent Labs  Lab 08/21/17 0546  WBC 6.2  HGB 12.8  HCT 37.8  PLT 217    Chemistries  Recent Labs  Lab 08/20/17 2157 08/21/17 0546  NA 139  --   K 3.3*  --   CL 105  --   CO2 24  --   GLUCOSE 115*  --   BUN 20  --   CREATININE 1.09* 0.85  CALCIUM 8.7*  --   AST 19  --   ALT 13*  --    ALKPHOS 82  --   BILITOT 0.7  --     Cardiac Enzymes Recent Labs  Lab 08/20/17 2157  TROPONINI <0.03    Microbiology Results  No results found for this or any previous visit.  RADIOLOGY:  Ct Head Wo Contrast  Result Date: 08/20/2017 CLINICAL DATA:  Left-sided facial and hand numbness EXAM: CT HEAD WITHOUT CONTRAST TECHNIQUE: Contiguous axial images were obtained from the base of the skull through the vertex without intravenous contrast. COMPARISON:  June 26, 2017 FINDINGS: Brain: There is mild diffuse atrophy. There is no intracranial mass, hemorrhage, extra-axial fluid collection, or midline shift. There is patchy small vessel disease throughout the centra semiovale bilaterally. Elsewhere gray-white compartments appear normal. No acute infarct evident. Vascular: There is no hyperdense vessel. There are foci of calcification in each carotid siphon. There is calcification in the distal left vertebral artery. Skull: The bony calvarium appears intact. Sinuses/Orbits: There is mucosal  thickening in several ethmoid air cells. Other visualized paranasal sinuses are clear. Visualized orbits appear symmetric bilaterally. Other: Mastoid air cells are clear. 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 No acute infarct. No mass or hemorrhage. There are foci of arterial vascular calcification. There is mucosal thickening in several ethmoid air cells. Electronically Signed   By: Bretta Bang III M.D.   On: 08/20/2017 22:05   Mr Brain Wo Contrast  Result Date: 08/21/2017 CLINICAL DATA:  Left hand tingling and left facial numbness beginning yesterday. EXAM: MRI HEAD WITHOUT CONTRAST MRA HEAD WITHOUT CONTRAST TECHNIQUE: Multiplanar, multiecho pulse sequences of the brain and surrounding structures were obtained without intravenous contrast. Angiographic images of the head were obtained using MRA technique without contrast. COMPARISON:  CT 08/20/2017 FINDINGS: MRI  HEAD FINDINGS Brain: Diffusion imaging does not show any acute or subacute infarction. There are extensive chronic small-vessel ischemic changes throughout the pons. No focal cerebellar insult. Cerebral hemispheres show advanced chronic small-vessel ischemic changes affecting the deep and subcortical white matter. No cortical or large vessel territory infarction. Small-vessel changes are present in the basal ganglia and thalami. No mass lesion, hemorrhage, hydrocephalus or extra-axial collection. Vascular: Major vessels at the base of the brain show flow. Skull and upper cervical spine: Negative Sinuses/Orbits: Clear/normal Other: None MRA HEAD FINDINGS Both internal carotid arteries are patent through the skullbase. There is pronounced tortuosity and atherosclerotic irregularity in the siphon regions, right worse than left. Particularly on the right, there could be flow limiting stenosis. The anterior and middle cerebral vessels are patent without proximal stenosis, aneurysm or vascular malformation. More distal branch vessels show some atherosclerotic irregularity. Both vertebral arteries are patent to the basilar. There is 50% stenosis of the distal left vertebral artery. No basilar stenosis. Superior cerebellar and posterior cerebral arteries are patent. There is some distal vessel irregularity of the PCA branches. IMPRESSION: No acute finding by MRI. 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 Possible flow limiting stenosis in the right siphon region. Distal vessel narrowing and irregularity of the intracranial vessels. Electronically Signed   By: Paulina Fusi M.D.   On: 08/21/2017 13:12   US Carotid Bilateral (at [facility] Only)  Result Date: 08/21/2017 CLINICAL DATA:  Focal neurological deficits EXAM: BILATERAL CAROTID DUPLEX ULTRASOUND TECHNIQUE: Wallace Cullens scale imaging, color Doppler and duplex ultrasound were  performed of bilateral carotid and vertebral arteries in the neck. COMPARISON:  None. FINDINGS: Criteria: Quantification of carotid stenosis is based on velocity parameters that correlate the residual internal carotid diameter with NASCET-based stenosis levels, using the diameter of the distal internal carotid lumen as the denominator for stenosis measurement. The following velocity measurements were obtained: RIGHT ICA:  70 cm/sec CCA:  77 cm/sec SYSTOLIC ICA/CCA RATIO:  0.9 DIASTOLIC ICA/CCA RATIO:  1.5 ECA:  75 cm/sec LEFT ICA:  74 cm/sec CCA:  81 cm/sec SYSTOLIC ICA/CCA RATIO:  0.9 DIASTOLIC ICA/CCA RATIO:  0.6 ECA:  70 cm/sec RIGHT CAROTID ARTERY: There is mild calcified plaque in the bulb. Low resistance internal carotid Doppler pattern is preserved. Mild tortuosity in the internal carotid artery. Mild scattered mixed plaque in the internal carotid. RIGHT VERTEBRAL ARTERY:  Antegrade. LEFT CAROTID ARTERY: Mild scattered calcified plaque in the bulb and internal carotid artery. Low resistance internal carotid Doppler pattern is preserved. LEFT VERTEBRAL ARTERY:  Antegrade. IMPRESSION: Less than 50% stenosis in the right and left internal carotid arteries. Electronically Signed   By: Merton Border  Hoss M.D.   On: 08/21/2017 09:49   Mr Maxine Glenn Head/brain GN Cm  Result Date: 08/21/2017 CLINICAL DATA:  Left hand tingling and left facial numbness beginning yesterday. EXAM: MRI HEAD WITHOUT CONTRAST MRA HEAD WITHOUT CONTRAST TECHNIQUE: Multiplanar, multiecho pulse sequences of the brain and surrounding structures were obtained without intravenous contrast. Angiographic images of the head were obtained using MRA technique without contrast. COMPARISON:  CT 08/20/2017 FINDINGS: MRI HEAD FINDINGS Brain: Diffusion imaging does not show any acute or subacute infarction. There are extensive chronic small-vessel ischemic changes throughout the pons. No focal cerebellar insult. Cerebral hemispheres show advanced chronic  small-vessel ischemic changes affecting the deep and subcortical white matter. No cortical or large vessel territory infarction. Small-vessel changes are present in the basal ganglia and thalami. No mass lesion, hemorrhage, hydrocephalus or extra-axial collection. Vascular: Major vessels at the base of the brain show flow. Skull and upper cervical spine: Negative Sinuses/Orbits: Clear/normal Other: None MRA HEAD FINDINGS Both internal carotid arteries are patent through the skullbase. There is pronounced tortuosity and atherosclerotic irregularity in the siphon regions, right worse than left. Particularly on the right, there could be flow limiting stenosis. The anterior and middle cerebral vessels are patent without proximal stenosis, aneurysm or vascular malformation. More distal branch vessels show some atherosclerotic irregularity. Both vertebral arteries are patent to the basilar. There is 50% stenosis of the distal left vertebral artery. No basilar stenosis. Superior cerebellar and posterior cerebral arteries are patent. There is some distal vessel irregularity of the PCA branches. IMPRESSION: No acute finding by MRI. 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 Possible flow limiting stenosis in the right siphon region. Distal vessel narrowing and irregularity of the intracranial vessels. Electronically Signed   By: Paulina Fusi M.D.   On: 08/21/2017 13:12      Management plans discussed with the patient, family and they are in agreement.  CODE STATUS:     Code Status Orders  (From admission, onward)        Start     Ordered   08/21/17 0149  Full code  Continuous     08/21/17 0148    Code Status History    Date Active Date Inactive Code Status Order ID Comments User Context   This patient has a current code status but no historical code status.      TOTAL TIME TAKING CARE OF THIS PATIENT: 40  minutes.    Houston Siren M.D on 08/21/2017 at 2:31 PM  Between 7am to 6pm - Pager - [phone]  After 6pm go to [email or web address] - Social research officer, government  [facility]  Hospitalists  Office  [phone]  CC: Primary care physician; Hillery Aldo, MD

## 2017-08-21 NOTE — Progress Notes (Signed)
OT Cancellation Note  Patient Details Name: Tina Fields MRN: [id number] DOB: 10-18-1945   Cancelled Treatment:    Reason Eval/Treat Not Completed: Patient at procedure or test/ unavailable. Order received, chart reviewed. Pt out of room for testing. Will re-attempt OT evaluation at later date/time as pt is available and medically appropriate.  Richrd Prime, MPH, MS, OTR/L ascom [phone] 08/21/17, 9:25 AM

## 2017-08-21 NOTE — Care Management Obs Status (Signed)
MEDICARE OBSERVATION STATUS NOTIFICATION   Patient Details  Name: Tina Fields MRN: [id number] Date of Birth: 1946/03/20   Medicare Observation Status Notification Given:  Yes    Gwenette Greet, RN 08/21/2017, 10:06 AM

## 2017-08-21 NOTE — Evaluation (Signed)
Physical Therapy Evaluation Patient Details Name: Tina Fields MRN: [id number] DOB: [birth year] Today's Date: 08/21/2017   History of Present Illness  Tina Fields  is a 71 y.o. female who presents with sort of numbness and tingling right hand and forearm, and then subsequently in her right face.    Clinical Impression  Pt presents to PT at baseline functional strength, balance, and gait and has no further s/s of numbness of tingling in face or R UE.  Pt's chief complaint is that she is tired from not sleeping last night.  Pt lives alone and her son is available PRN.  Pt currently works and drives and takes care of all her personal needs independently.  Educated pt on stroke symptoms and to seek medical care if they return.  No further acute PT needs at this time and and post acute needs identified.    Follow Up Recommendations No PT follow up    Equipment Recommendations  None recommended by PT    Recommendations for Other Services       Precautions / Restrictions Precautions Precautions: Fall Precaution Comments: Moderate Restrictions Weight Bearing Restrictions: No      Mobility  Bed Mobility Overal bed mobility: Independent      General bed mobility comments: Transfers supine<>sit without difficulty and without physical assistance.  Transfers Overall transfer level: Independent Equipment used: None      General transfer comment: Sit<>stand indpendently wihtout device or assist and rising on fist attempt easily.  Ambulation/Gait Ambulation/Gait assistance: Independent Ambulation Distance (Feet): 150 Feet Assistive device: None Gait Pattern/deviations: WFL(Within Functional Limits);Step-through pattern     General Gait Details: Steady gait wihtout balance deviations and normal cadence on level surfaces.  Stairs     Wheelchair Mobility    Modified Rankin (Stroke Patients Only) Modified Rankin (Stroke Patients Only) Modified Rankin: No symptoms      Balance Overall balance assessment: Independent          Pertinent Vitals/Pain Pain Assessment: No/denies pain    Home Living Family/patient expects to be discharged to:: Private residence Living Arrangements: Children Available Help at Discharge: Available PRN/intermittently Type of Home: House Home Access: Stairs to enter Entrance Stairs-Rails: None Entrance Stairs-Number of Steps: 2 Home Layout: Two level;Able to live on main level with bedroom/bathroom Home Equipment: None      Prior Function Level of Independence: Independent       Comments: Pt independent with all ADL's and 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     Hand Dominance        Extremity/Trunk Assessment   Upper Extremity Assessment Upper Extremity Assessment: Overall WFL for tasks assessed    Lower Extremity Assessment Lower Extremity Assessment: Overall WFL for tasks assessed    Cervical / Trunk Assessment Cervical / Trunk Assessment: Normal  Communication   Communication: No difficulties  Cognition Arousal/Alertness: Awake/alert Behavior During Therapy: WFL for tasks assessed/performed Overall Cognitive Status: Within Functional Limits for tasks assessed        General Comments: Reports feeling tired from not sleeping last night.      General Comments General comments (skin integrity, edema, etc.): visible areas intact    Exercises     Assessment/Plan    PT Assessment Patent does not need any further PT services  PT Problem List         PT Treatment Interventions      PT Goals (Current goals can be found in the Care  Plan section)  Acute Rehab PT Goals Patient Stated Goal: To go home. PT Goal Formulation: With patient Time For Goal Achievement: 08/21/17 Potential to Achieve Goals: Good    Frequency     Barriers to discharge  none      Co-evaluation    NA    AM-PAC PT "6 Clicks" Daily Activity   Outcome Measure Difficulty turning over in bed (including adjusting bedclothes, sheets and blankets)?: None Difficulty moving from lying on back to sitting on the side of the bed? : None Difficulty sitting down on and standing up from a chair with arms (e.g., wheelchair, bedside commode, etc,.)?: None Help needed moving to and from a bed to chair (including a wheelchair)?: None Help needed walking in hospital room?: None Help needed climbing 3-5 steps with a railing? : None 6 Click Score: 24    End of Session   Activity Tolerance: Patient tolerated treatment well Patient left: in bed;with call bell/phone within reach;with family/visitor present Nurse Communication: Mobility status PT Visit Diagnosis: Other symptoms and signs involving the nervous system (R29.898)    Time: 8119-1478 PT Time Calculation (min) (ACUTE ONLY): 16 min   Charges:   PT Evaluation $PT Eval Low Complexity: 1 Low     PT G Codes:   PT G-Codes **NOT FOR INPATIENT CLASS** Functional Assessment Tool Used: AM-PAC 6 Clicks Basic Mobility Functional Limitation: Mobility: Walking and moving around Mobility: Walking and Moving Around Current Status (G9562): 0 percent impaired, limited or restricted Mobility: Walking and Moving Around Goal Status (Z3086): 0 percent impaired, limited or restricted Mobility: Walking and Moving Around Discharge Status (V7846): 0 percent impaired, limited or restricted    Jadeyn Hargett A Camdyn Laden, PT 08/21/2017, 11:41 AM

## 2017-08-21 NOTE — Progress Notes (Signed)
OT Cancellation Note  Patient Details Name: Tina Fields MRN: [id number] DOB: [birth year]   Cancelled Treatment:    Reason Eval/Treat Not Completed: OT screened, no needs identified, will sign off. On 3rd attempt, pt screened, reports feeling back to baseline independence. No strength/sensory/visual/cognitive deficits noted. Will sign off. Please re-consult if additional needs arise.   Richrd Prime, MPH, MS, OTR/L ascom [phone] 08/21/17, 1:50 PM

## 2017-08-21 NOTE — Consult Note (Signed)
Referring Physician: Cherlynn Kaiser    Chief Complaint: Left sided paresthesias  HPI: Tina Fields is an 71 y.o. 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  The patient reports that she noticed that her left fingers on her hand her left wrist tingly and numb but did not feel weak about an hour prior to her presentation.  She started coming the hospital and the symptoms started to improve, however she then noticed on arrival to the ER that she felt as though her left side of her face had tingling and numbness.  Symptoms resolved.  BP was elevated and patient was admitted for further evaluation. Initial NIHSS of 0.   Date last known well: Date: 08/20/2017 Time last known well: Time: 20:00 tPA Given: No: Symptoms resolved.    Past Medical History:  Diagnosis Date  . Acid reflux   . Asthma   . Edema of both legs   . Hypertension   . RA (rheumatoid arthritis) (HCC)     Past Surgical History:  Procedure Laterality Date  . TUBAL LIGATION      Family History  Problem Relation Age of Onset  . CAD Mother   . Diabetes Mother   . Hypertension Mother   . CAD Father   . Diabetes Father   . Hypertension Father   . Stroke Father    Social History:  reports that she quit smoking about 22 years ago. she has never used smokeless tobacco. She reports that she does not drink alcohol or use drugs.  Allergies:  Allergies  Allergen Reactions  . Azithromycin Rash  . Latex Hives    Medications:  I have reviewed the patient's current medications. Prior to Admission:  Medications Prior to Admission  Medication Sig Dispense Refill Last Dose  . diltiazem (CARDIZEM SR) 60 MG 12 hr capsule Take 60 mg by mouth 2 (two) times daily.   08/20/2017 at Unknown time  . fluticasone (FLONASE) 50 MCG/ACT nasal spray Place 2 sprays into both nostrils daily as needed for congestion.  4 prn at prn  . losartan (COZAAR) 100 MG tablet Take 100 mg by mouth daily.   08/20/2017  at Unknown time  . metoprolol succinate (TOPROL-XL) 25 MG 24 hr tablet Take 25 mg by mouth daily.   08/20/2017 at 1300  . omeprazole (PRILOSEC) 20 MG capsule Take 20 mg by mouth 2 (two) times daily.  1 08/20/2017 at Unknown time   Scheduled: . diltiazem  60 mg Oral Q12H  . enoxaparin (LOVENOX) injection  40 mg Subcutaneous Q24H  . losartan  100 mg Oral Daily  . metoprolol succinate  25 mg Oral Daily  . pantoprazole  40 mg Oral BID    ROS: History obtained from the patient  General ROS: negative for - chills, fatigue, fever, night sweats, weight gain or weight loss Psychological ROS: negative for - behavioral disorder, hallucinations, memory difficulties, mood swings or suicidal ideation Ophthalmic ROS: negative for - blurry vision, double vision, eye pain or loss of vision ENT ROS: negative for - epistaxis, nasal discharge, oral lesions, sore throat, tinnitus or vertigo Allergy and Immunology ROS: negative for - hives or itchy/watery eyes Hematological and Lymphatic ROS: negative for - bleeding problems, bruising or swollen lymph nodes Endocrine ROS: negative for - galactorrhea, hair pattern changes, polydipsia/polyuria or temperature intolerance Respiratory ROS: negative for - cough, hemoptysis, shortness of breath or wheezing Cardiovascular ROS: negative for - chest pain, dyspnea on exertion, edema or irregular heartbeat  Gastrointestinal ROS: negative for - abdominal pain, diarrhea, hematemesis, nausea/vomiting or stool incontinence Genito-Urinary ROS: negative for - dysuria, hematuria, incontinence or urinary frequency/urgency Musculoskeletal ROS: negative for - joint swelling or muscular weakness Neurological ROS: as noted in HPI Dermatological ROS: negative for rash and skin lesion changes  Physical Examination: Blood pressure (!) 175/71, pulse 71, temperature (!) 97.5 F (36.4 C), temperature source Oral, resp. rate 16, height 5\' 6"  (1.676 m), weight 92.5 kg (203 lb 14.4 oz),  SpO2 97 %.  HEENT-  Normocephalic, no lesions, without obvious abnormality.  Normal external eye and conjunctiva.  Normal TM's bilaterally.  Normal auditory canals and external ears. Normal external nose, mucus membranes and septum.  Normal pharynx. Cardiovascular- S1, S2 normal, pulses palpable throughout   Lungs- chest clear, no wheezing, rales, normal symmetric air entry Abdomen- soft, non-tender; bowel sounds normal; no masses,  no organomegaly Extremities- mild LE edema Lymph-no adenopathy palpable Musculoskeletal-no joint tenderness, deformity or swelling Skin-warm and dry, no hyperpigmentation, vitiligo, or suspicious lesions  Neurological Examination   Mental Status: Alert, oriented, thought content appropriate.  Speech fluent without evidence of aphasia.  Able to follow 3 step commands without difficulty. Cranial Nerves: II: Discs flat bilaterally; Visual fields grossly normal, pupils equal, round, reactive to light and accommodation III,IV, VI: ptosis not present, extra-ocular motions intact bilaterally V,VII: smile symmetric, facial light touch sensation normal bilaterally VIII: hearing normal bilaterally IX,X: gag reflex present XI: bilateral shoulder shrug XII: midline tongue extension Motor: Right : Upper extremity   5/5    Left:     Upper extremity   5/5  Lower extremity   5/5     Lower extremity   5/5 Tone and bulk:normal tone throughout; no atrophy noted Sensory: Pinprick and light touch intact throughout, bilaterally Deep Tendon Reflexes: 2+ and symmetric with absent AJ's bilaterally Plantars: Right: downgoing   Left: downgoing Cerebellar: Normal finger-to-nose and normal heel-to-shin testing bilaterally Gait: not tested due to safety concerns    Laboratory Studies:  Basic Metabolic Panel: Recent Labs  Lab 08/20/17 22-Jan-2156 08/21/17 0546  NA 139  --   K 3.3*  --   CL 105  --   CO2 24  --   GLUCOSE 115*  --   BUN 20  --   CREATININE 1.09* 0.85  CALCIUM  8.7*  --     Liver Function Tests: Recent Labs  Lab 08/20/17 2156-01-22  AST 19  ALT 13*  ALKPHOS 82  BILITOT 0.7  PROT 7.6  ALBUMIN 3.3*   No results for input(s): LIPASE, AMYLASE in the last 168 hours. No results for input(s): AMMONIA in the last 168 hours.  CBC: Recent Labs  Lab 08/20/17 01-22-56 08/21/17 0546  WBC 6.7 6.2  NEUTROABS 3.8  --   HGB 13.1 12.8  HCT 39.5 37.8  MCV 92.7 92.9  PLT 247 217    Cardiac Enzymes: Recent Labs  Lab 08/20/17 01-22-56  TROPONINI <0.03    BNP: Invalid input(s): POCBNP  CBG: No results for input(s): GLUCAP in the last 168 hours.  Microbiology: No results found for this or any previous visit.  Coagulation Studies: Recent Labs    08/20/17 22-Jan-2156  LABPROT 13.1  INR 1.00    Urinalysis: No results for input(s): COLORURINE, LABSPEC, PHURINE, GLUCOSEU, HGBUR, BILIRUBINUR, KETONESUR, PROTEINUR, UROBILINOGEN, NITRITE, LEUKOCYTESUR in the last 168 hours.  Invalid input(s): APPERANCEUR  Lipid Panel:    Component Value Date/Time   CHOL 174 08/21/2017 0546   TRIG 190 (H) 08/21/2017  0546   HDL 28 (L) 08/21/2017 0546   CHOLHDL 6.2 08/21/2017 0546   VLDL 38 08/21/2017 0546   LDLCALC 108 (H) 08/21/2017 0546    HgbA1C: No results found for: HGBA1C  Urine Drug Screen:  No results found for: LABOPIA, COCAINSCRNUR, LABBENZ, AMPHETMU, THCU, LABBARB  Alcohol Level: No results for input(s): ETH in the last 168 hours.  Other results: EKG: normal sinus rhythm at 67 bpm.  Imaging: Ct Head Wo Contrast  Result Date: 08/20/2017 CLINICAL DATA:  Left-sided facial and hand numbness EXAM: CT HEAD WITHOUT CONTRAST TECHNIQUE: Contiguous axial images were obtained from the base of the skull through the vertex without intravenous contrast. COMPARISON:  June 26, 2017 FINDINGS: Brain: There is mild diffuse atrophy. There is no intracranial mass, hemorrhage, extra-axial fluid collection, or midline shift. There is patchy small vessel disease throughout  the centra semiovale bilaterally. Elsewhere gray-white compartments appear normal. No acute infarct evident. Vascular: There is no hyperdense vessel. There are foci of calcification in each carotid siphon. There is calcification in the distal left vertebral artery. Skull: The bony calvarium appears intact. Sinuses/Orbits: There is mucosal thickening in several ethmoid air cells. Other visualized paranasal sinuses are clear. Visualized orbits appear symmetric bilaterally. Other: Mastoid air cells are clear. 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 No acute infarct. No mass or hemorrhage. There are foci of arterial vascular calcification. There is mucosal thickening in several ethmoid air cells. Electronically Signed   By: Bretta Bang III M.D.   On: 08/20/2017 22:05   US Carotid Bilateral (at [facility] Only)  Result Date: 08/21/2017 CLINICAL DATA:  Focal neurological deficits EXAM: BILATERAL CAROTID DUPLEX ULTRASOUND TECHNIQUE: Wallace Cullens scale imaging, color Doppler and duplex ultrasound were performed of bilateral carotid and vertebral arteries in the neck. COMPARISON:  None. FINDINGS: Criteria: Quantification of carotid stenosis is based on velocity parameters that correlate the residual internal carotid diameter with NASCET-based stenosis levels, using the diameter of the distal internal carotid lumen as the denominator for stenosis measurement. The following velocity measurements were obtained: RIGHT ICA:  70 cm/sec CCA:  77 cm/sec SYSTOLIC ICA/CCA RATIO:  0.9 DIASTOLIC ICA/CCA RATIO:  1.5 ECA:  75 cm/sec LEFT ICA:  74 cm/sec CCA:  81 cm/sec SYSTOLIC ICA/CCA RATIO:  0.9 DIASTOLIC ICA/CCA RATIO:  0.6 ECA:  70 cm/sec RIGHT CAROTID ARTERY: There is mild calcified plaque in the bulb. Low resistance internal carotid Doppler pattern is preserved. Mild tortuosity in the internal carotid artery. Mild scattered mixed plaque in the internal carotid. RIGHT VERTEBRAL ARTERY:   Antegrade. LEFT CAROTID ARTERY: Mild scattered calcified plaque in the bulb and internal carotid artery. Low resistance internal carotid Doppler pattern is preserved. LEFT VERTEBRAL ARTERY:  Antegrade. IMPRESSION: Less than 50% stenosis in the right and left internal carotid arteries. Electronically Signed   By: Jolaine Click M.D.   On: 08/21/2017 09:49    Assessment: 71 y.o. female presenting with left sided paresthesias that have resolved.  BP elevated.  MRI of the brain reviewed and shows no acute changes.  Extensive small vessel disease present.  TIA likely.  Patient on no antiplatelet therapy due to bruising.  MRA shows possible flow limiting right siphon disease.  Carotid dopplers show no evidence of hemodynamically significant stenosis.  Echocardiogram pending.  A1c pending, LDL 157.  Stroke Risk Factors - hypertension  Plan: 1. CTA of the head and neck 2. Statin for lipid management with target LDL<70. 3. PT consult, OT consult, Speech consult 4.  Echocardiogram pending 5. Prophylactic therapy-Antiplatelet med: Aspirin - dose 81mg  daily 6. NPO until RN stroke swallow screen 7. Telemetry monitoring 8. Frequent neuro checks   Thana FarrLeslie Stephannie Broner, MD Neurology [phone] 08/21/2017, 10:36 AM

## 2017-08-21 NOTE — H&P (Signed)
[facility] [place] [facility]   PATIENT NAME: Tina Fields    MR#:  [id number]  DATE OF BIRTH:  [birth year]  DATE OF ADMISSION:  08/20/2017  PRIMARY CARE PHYSICIAN: Hillery Aldo, MD   REQUESTING/REFERRING PHYSICIAN: Fanny Bien, MD  CHIEF COMPLAINT:   Chief Complaint  Patient presents with  . Code Stroke    HISTORY OF PRESENT ILLNESS:  Tina Fields  is a 71 y.o. female who presents with sort of numbness and tingling right hand and forearm, and then subsequently in her right face.  Patient states that initially she had right distal extremity thumb and forefinger numbness and tingling which slowly progressed up to about the middle of her forearm laterally, and then receded.  This occurred over several minutes.  Then she developed left facial numbness and tingling, as well as some sensation of the same.  This lasted quite a bit longer, all the way up until she was on her way to the ED and then started to subside, but did not completely resolve until after she was already here in the ED.  Initial workup is largely within normal limits, but there is concern for possible neurologic event.  Hospitalist were called for admission and further evaluation  PAST MEDICAL HISTORY:   Past Medical History:  Diagnosis Date  . Acid reflux   . Asthma   . Edema of both legs   . Hypertension   . RA (rheumatoid arthritis) (HCC)     PAST SURGICAL HISTORY:   Past Surgical History:  Procedure Laterality Date  . TUBAL LIGATION      SOCIAL HISTORY:   Social History   Tobacco Use  . Smoking status: Former Smoker    Last attempt to quit: 01/09/1995    Years since quitting: 22.6  . Smokeless tobacco: Never Used  Substance Use Topics  . Alcohol use: No    FAMILY HISTORY:   Family History  Problem Relation Age of Onset  . CAD Mother   . Diabetes Mother   . Hypertension Mother   . CAD Father   . Diabetes Father   . Hypertension Father   . Stroke Father     DRUG  ALLERGIES:   Allergies  Allergen Reactions  . Azithromycin Rash  . Latex Hives    MEDICATIONS AT HOME:   Prior to Admission medications   Medication Sig Start Date End Date Taking? Authorizing Provider  diltiazem (CARDIZEM SR) 60 MG 12 hr capsule Take 60 mg by mouth 2 (two) times daily.    [provider]  fluticasone (FLONASE) 50 MCG/ACT nasal spray Place 2 sprays into both nostrils daily as needed for congestion. 06/06/17   [provider]  losartan (COZAAR) 100 MG tablet Take 100 mg by mouth daily.    [provider]  metoprolol succinate (TOPROL-XL) 25 MG 24 hr tablet Take 25 mg by mouth daily.    [provider]  omeprazole (PRILOSEC) 20 MG capsule Take 20 mg by mouth 2 (two) times daily. 05/26/17   [provider]    REVIEW OF SYSTEMS:  Review of Systems  Constitutional: Negative for chills, fever, malaise/fatigue and weight loss.  HENT: Negative for ear pain, hearing loss and tinnitus.   Eyes: Negative for blurred vision, double vision, pain and redness.  Respiratory: Negative for cough, hemoptysis and shortness of breath.   Cardiovascular: Negative for chest pain, palpitations, orthopnea and leg swelling.  Gastrointestinal: Negative for abdominal pain, constipation, diarrhea, nausea and vomiting.  Genitourinary: Negative for dysuria, frequency and hematuria.  Musculoskeletal: Negative for back pain, joint pain and neck pain.  Skin:       No acne, rash, or lesions  Neurological: Positive for sensory change. Negative for dizziness, tremors, focal weakness and weakness.  Endo/Heme/Allergies: Negative for polydipsia. Does not bruise/bleed easily.  Psychiatric/Behavioral: Negative for depression. The patient is not nervous/anxious and does not have insomnia.      VITAL SIGNS:   Vitals:   08/21/17 0000 08/21/17 0015 08/21/17 0030 08/21/17 0045  BP: (!) 175/81 (!) 174/80 (!) 181/80 (!) 169/61  Pulse: 72 67 (!) 59 (!) 59  Resp: (!)  34 (!) 38 (!) 32 (!) 22  Temp:      TempSrc:      SpO2: 93% 93% 96% 94%  Weight:      Height:       Wt Readings from Last 3 Encounters:  08/20/17 90.7 kg (200 lb)  06/26/17 89.8 kg (198 lb)  12/19/16 93.9 kg (207 lb)    PHYSICAL EXAMINATION:  Physical Exam  Vitals reviewed. Constitutional: She is oriented to person, place, and time. She appears well-developed and well-nourished. No distress.  HENT:  Head: Normocephalic and atraumatic.  Mouth/Throat: Oropharynx is clear and moist.  Eyes: Conjunctivae and EOM are normal. Pupils are equal, round, and reactive to light. No scleral icterus.  Neck: Normal range of motion. Neck supple. No JVD present. No thyromegaly present.  Cardiovascular: Normal rate, regular rhythm and intact distal pulses. Exam reveals no gallop and no friction rub.  No murmur heard. Respiratory: Effort normal and breath sounds normal. No respiratory distress. She has no wheezes. She has no rales.  GI: Soft. Bowel sounds are normal. She exhibits no distension. There is no tenderness.  Musculoskeletal: Normal range of motion. She exhibits no edema.  No arthritis, no gout  Lymphadenopathy:    She has no cervical adenopathy.  Neurological: She is alert and oriented to person, place, and time. No cranial nerve deficit.  Neurologic: Cranial nerves II-XII intact, Sensation intact to light touch/pinprick, 5/5 strength in all extremities, no dysarthria, no aphasia, no dysphagia, memory intact, finger to nose testing showed no abnormality, no pronator drift  Skin: Skin is warm and dry. No rash noted. No erythema.  Psychiatric: She has a normal mood and affect. Her behavior is normal. Judgment and thought content normal.    LABORATORY PANEL:   CBC Recent Labs  Lab 08/20/17 2157  WBC 6.7  HGB 13.1  HCT 39.5  PLT 247   ------------------------------------------------------------------------------------------------------------------  Chemistries  Recent Labs  Lab  08/20/17 2157  NA 139  K 3.3*  CL 105  CO2 24  GLUCOSE 115*  BUN 20  CREATININE 1.09*  CALCIUM 8.7*  AST 19  ALT 13*  ALKPHOS 82  BILITOT 0.7   ------------------------------------------------------------------------------------------------------------------  Cardiac Enzymes Recent Labs  Lab 08/20/17 2157  TROPONINI <0.03   ------------------------------------------------------------------------------------------------------------------  RADIOLOGY:  Ct Head Wo Contrast  Result Date: 08/20/2017 CLINICAL DATA:  Left-sided facial and hand numbness EXAM: CT HEAD WITHOUT CONTRAST TECHNIQUE: Contiguous axial images were obtained from the base of the skull through the vertex without intravenous contrast. COMPARISON:  June 26, 2017 FINDINGS: Brain: There is mild diffuse atrophy. There is no intracranial mass, hemorrhage, extra-axial fluid collection, or midline shift. There is patchy small vessel disease throughout the centra semiovale bilaterally. Elsewhere gray-white compartments appear normal. No acute infarct evident. Vascular: There is no hyperdense vessel. There are foci of calcification in each carotid  siphon. There is calcification in the distal left vertebral artery. Skull: The bony calvarium appears intact. Sinuses/Orbits: There is mucosal thickening in several ethmoid air cells. Other visualized paranasal sinuses are clear. Visualized orbits appear symmetric bilaterally. Other: Mastoid air cells are clear. 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 No acute infarct. No mass or hemorrhage. There are foci of arterial vascular calcification. There is mucosal thickening in several ethmoid air cells. Electronically Signed   By: Bretta Bang III M.D.   On: 08/20/2017 22:05    EKG:   Orders placed or performed during the hospital encounter of 08/20/17  . EKG 12-Lead  . EKG 12-Lead  . ED EKG  . ED EKG    IMPRESSION AND PLAN:  Principal  Problem:   Focal neurological deficit -see HPI for details, admit patient per stroke/TIA admission order set with appropriate imaging, labs, consults Active Problems:   HTN (hypertension) -permissive hypertension for now until MRI is done to either rule in or rule out stroke.  Blood pressure goal less than 220/120   GERD (gastroesophageal reflux disease) -home dose PPI  All the records are reviewed and case discussed with ED provider. Management plans discussed with the patient and/or family.  DVT PROPHYLAXIS: SubQ lovenox  GI PROPHYLAXIS: PPI  ADMISSION STATUS: Observation  CODE STATUS: Full Code Status History    This patient does not have a recorded code status. Please follow your organizational policy for patients in this situation.      TOTAL TIME TAKING CARE OF THIS PATIENT: 40 minutes.   Shakelia Scrivner FIELDING 08/21/2017, 12:57 AM  [facility]  Office  [phone]  CC: Primary care physician; Hillery Aldo, MD  Note:  This document was prepared using Dragon voice recognition software and may include unintentional dictation errors.

## 2017-08-21 NOTE — ED Provider Notes (Signed)
[facility] Emergency Department Provider Note   ____________________________________________   First MD Initiated Contact with Patient 08/20/17 2158     (approximate)  I have reviewed the triage vital signs and the nursing notes.   HISTORY  Chief Complaint Code Stroke    HPI Ruhi Kopke is a 71 y.o. female presents for evaluation of left hand tingling and left facial numbness.  Patient reports about 1 hour prior to arrival she noticed that her left fingers on her hand her left wrist tingly and numb but did not feel weak.  She started coming the hospital and the symptoms started to improve, however she then noticed on arrival to the ER that she felt as though her left side of her face had tingling and numbness which is since gone away in the last several minutes.  She reports no ongoing symptoms.  No trouble breathing, no weakness, no trouble speaking, no ongoing numbness or weakness in the arms or legs.  She reports this is never happened before.  And she is on 3 blood pressure medicines, note her blood pressure is elevated today  Denies previous history of any stroke.  No nausea or vomiting.  She did have a very mild headache which has since gone away.  No neck pain  Past Medical History:  Diagnosis Date  . Acid reflux   . Asthma   . Edema of both legs   . Hypertension     There are no active problems to display for this patient.   History reviewed. No pertinent surgical history.  Prior to Admission medications   Medication Sig Start Date End Date Taking? Authorizing Provider  cloNIDine (CATAPRES) 0.1 MG tablet Take 1 tablet (0.1 mg total) by mouth 2 (two) times daily as needed. 12/20/16   Irean Hong, MD  diltiazem (CARDIZEM SR) 60 MG 12 hr capsule Take 60 mg by mouth 2 (two) times daily.    [provider]  losartan (COZAAR) 100 MG tablet Take 100 mg by mouth daily.    [provider]  metoprolol succinate  (TOPROL-XL) 25 MG 24 hr tablet Take 25 mg by mouth daily.    [provider]    Allergies Azithromycin and Latex  No family history on file.  Social History Social History   Tobacco Use  . Smoking status: Former Smoker    Last attempt to quit: 01/09/1995    Years since quitting: 22.6  . Smokeless tobacco: Never Used  Substance Use Topics  . Alcohol use: No  . Drug use: No    Review of Systems Constitutional: No fever/chills Eyes: No visual changes. ENT: No sore throat. Cardiovascular: Denies chest pain. Respiratory: Denies shortness of breath. Gastrointestinal: No abdominal pain.  No nausea, no vomiting.  No diarrhea.  No constipation. Genitourinary: Negative for dysuria. Musculoskeletal: Negative for back pain. Skin: Negative for rash. Neurological: Negative for headaches or focal weakness.    ____________________________________________   PHYSICAL EXAM:  VITAL SIGNS: ED Triage Vitals  Enc Vitals Group     BP 08/20/17 2123 (!) 194/100     Pulse Rate 08/20/17 2123 73     Resp 08/20/17 2123 18     Temp 08/20/17 2123 (!) 97.5 F (36.4 C)     Temp Source 08/20/17 2123 Oral     SpO2 08/20/17 2123 97 %     Weight 08/20/17 2120 200 lb (90.7 kg)     Height 08/20/17 2120 5\' 6"  (1.676 m)  Head Circumference --      Peak Flow --      Pain Score --      Pain Loc --      Pain Edu? --      Excl. in GC? --     Constitutional: Alert and oriented. Well appearing and in no acute distress. Eyes: Conjunctivae are normal. Head: Atraumatic. Nose: No congestion/rhinnorhea. Mouth/Throat: Mucous membranes are moist. Neck: No stridor.   Cardiovascular: Normal rate, regular rhythm. Grossly normal heart sounds.  Good peripheral circulation. Respiratory: Normal respiratory effort.  No retractions. Lungs CTAB. Gastrointestinal: Soft and nontender. No distention. Musculoskeletal: No lower extremity tenderness nor edema. Neurologic:  Normal speech and language. No  gross focal neurologic deficits are appreciated.   NIH score equals 0, performed by me at bedside. The patient has no pronator drift. The patient has normal cranial nerve exam. Extraocular movements are normal. Visual fields are normal. Patient has 5 out of 5 strength in all extremities. There is no numbness or gross, acute sensory abnormality in the extremities bilaterally. No speech disturbance. No dysarthria. No aphasia. No ataxia. Normal finger nose finger bilat. Patient speaking in full and clear sentences.   Skin:  Skin is warm, dry and intact. No rash noted. Psychiatric: Mood and affect are normal. Speech and behavior are normal.  ____________________________________________   LABS (all labs ordered are listed, but only abnormal results are displayed)  Labs Reviewed  COMPREHENSIVE METABOLIC PANEL - Abnormal; Notable for the following components:      Result Value   Potassium 3.3 (*)    Glucose, Bld 115 (*)    Creatinine, Ser 1.09 (*)    Calcium 8.7 (*)    Albumin 3.3 (*)    ALT 13 (*)    GFR calc non Af Amer 49 (*)    GFR calc Af Amer 57 (*)    All other components within normal limits  PROTIME-INR  APTT  CBC  DIFFERENTIAL  TROPONIN I  CBG MONITORING, ED   ____________________________________________  EKG  Reviewed and interpreted by me at 2130 Heart rate 70 QRS 80 QTC 430 Normal sinus rhythm, minimal nonspecific T wave noted primarily in the lateral precordial leads ____________________________________________  RADIOLOGY  Ct Head Wo Contrast  Result Date: 08/20/2017 CLINICAL DATA:  Left-sided facial and hand numbness EXAM: CT HEAD WITHOUT CONTRAST TECHNIQUE: Contiguous axial images were obtained from the base of the skull through the vertex without intravenous contrast. COMPARISON:  June 26, 2017 FINDINGS: Brain: There is mild diffuse atrophy. There is no intracranial mass, hemorrhage, extra-axial fluid collection, or midline shift. There is  patchy small vessel disease throughout the centra semiovale bilaterally. Elsewhere gray-white compartments appear normal. No acute infarct evident. Vascular: There is no hyperdense vessel. There are foci of calcification in each carotid siphon. There is calcification in the distal left vertebral artery. Skull: The bony calvarium appears intact. Sinuses/Orbits: There is mucosal thickening in several ethmoid air cells. Other visualized paranasal sinuses are clear. Visualized orbits appear symmetric bilaterally. Other: Mastoid air cells are clear. 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 No acute infarct. No mass or hemorrhage. There are foci of arterial vascular calcification. There is mucosal thickening in several ethmoid air cells. Electronically Signed   By: Bretta Bang III M.D.   On: 08/20/2017 22:05    CT of the head reviewed, negative for acute except for some mucosal thickening in the ethmoid ____________________________________________   PROCEDURES  Procedure(s) performed: None  Procedures  Critical Care performed: Yes, see critical care note(s)  CRITICAL CARE Performed by: Sharyn Creamer   Total critical care time: 35 minutes  Critical care time was exclusive of separately billable procedures and treating other patients.  Critical care was necessary to treat or prevent imminent or life-threatening deterioration.  Critical care was time spent personally by me on the following activities: development of treatment plan with patient and/or surrogate as well as nursing, discussions with consultants, evaluation of patient's response to treatment, examination of patient, obtaining history from patient or surrogate, ordering and performing treatments and interventions, ordering and review of laboratory studies, ordering and review of radiographic studies, pulse oximetry and re-evaluation of patient's condition.  Seen and evaluated rapidly due to  left-sided facial numbness proceed with left hand numbness.  Patient initiated as a code stroke ____________________________________________   INITIAL IMPRESSION / ASSESSMENT AND PLAN / ED COURSE  Pertinent labs & imaging results that were available during my care of the patient were reviewed by me and considered in my medical decision making (see chart for details).  Patient seen rapidly, no evidence of objective finding with NIH exam of 0.  Patient does report her symptoms have improved since she arrived in the ER.  She does report unilateral numbness which is since abated.  No other associated symptomatology.  Blood pressure is notably elevated, in discussion with neurologist recommend observation and permissive hypertension as she appears to have had symptoms of a possible TIA.  Will provide aspirin, denies any cardiac or pulmonary symptoms.  No other neurologic symptoms.  Will admit to the hospital for further workup under the hospitalist service.  Case discussed with Dr. Anne Hahn  Clinical Course as of Aug 21 2  Mon Aug 20, 2017  2200 NIH 0 on my exam at this time. Patient reports symptoms have now resolved.  [MQ]  2200 VAN negative.  [MQ]    Clinical Course User Index [MQ] Sharyn Creamer, MD     ____________________________________________   FINAL CLINICAL IMPRESSION(S) / ED DIAGNOSES  Final diagnoses:  TIA (transient ischemic attack)      NEW MEDICATIONS STARTED DURING THIS VISIT:  This SmartLink is deprecated. Use AVSMEDLIST instead to display the medication list for a patient.   Note:  This document was prepared using Dragon voice recognition software and may include unintentional dictation errors.     Sharyn Creamer, MD 08/21/17 [phone]

## 2017-08-21 NOTE — Care Management Note (Signed)
Case Management Note  Patient Details  Name: Tina Fields MRN: [id number] Date of Birth: 30-May-1946  Subjective/Objective: Admitted to [facility] under observation status with the diagnosis of focal neurologic deficit. Lives alone. Orpah Greek  Is sister's cousin [phone]). Prescriptions are filled at [facility] on [facility] [address] [facility]. No home Health. No skilled facility. No home oxygen. No medical equipment. Takes care of all basic and instrumental activities of daily living herself, drives. Last fall was 6 weeks ago. Fair appetite.  Son or friend will transport                   Action/Plan: No needs identified at this time, will continue to follow   Expected Discharge Date:                  Expected Discharge Plan:     In-House Referral:   yes  Discharge planning Services     Post Acute Care Choice:    Choice offered to:     DME Arranged:    DME Agency:     HH Arranged:    HH Agency:     Status of Service:     If discussed at [employer] of Stay Meetings, dates discussed:    Additional Comments:  Gwenette Greet, RN MSN CCM Care Management [phone] 08/21/2017, 10:07 AM

## 2017-08-21 NOTE — Progress Notes (Signed)
Discussed discharge instructions and medications with patient. IV removed. All questions addressed. Patient transported home via car by her sister.  Danielle Stephanye Finnicum, RN 

## 2017-08-21 NOTE — Progress Notes (Signed)
SLP Cancellation Note  Patient Details Name: Tina Fields MRN: [id number] DOB: [birth year]   Cancelled treatment:       Reason Eval/Treat Not Completed: SLP screened, no needs identified, will sign off(chart reviewed; consulted NSG then met w/ patient). Speech Therapy Note: received order, reviewed chart notes. Consulted NSG then pt and family. Pt denied any difficulty swallowing and is currently on a regular diet; tolerates swallowing pills w/ water per NSG. Pt conversed at conversational level w/out deficits noted; pt talking on phone and denied any speech-language deficits.  No further skilled ST services indicated as pt appears at her baseline. Pt agreed. NSG to reconsult if any change in status.    Orinda Kenner, MS, CCC-SLP Watson,Katherine 08/21/2017, 11:09 AM

## 2017-08-21 NOTE — Progress Notes (Signed)
CH responded to an OR for an AD. Patient is awake and alert. CH educated on the document. Pt stated that she would like time to consider before completion. CH is available for follow up as needed.    08/21/17 1100  Clinical Encounter Type  Visited With Patient  Visit Type Initial;Spiritual support  Referral From Nurse  Consult/Referral To Chaplain  Spiritual Encounters  Spiritual Needs Literature

## 2017-08-22 LAB — HEMOGLOBIN A1C
HEMOGLOBIN A1C: 6.6 % — AB (ref 4.8–5.6)
MEAN PLASMA GLUCOSE: 143 mg/dL

## 2018-08-06 ENCOUNTER — Ambulatory Visit: Payer: Self-pay | Admitting: Urology

## 2018-08-08 ENCOUNTER — Other Ambulatory Visit: Payer: Self-pay

## 2018-08-08 ENCOUNTER — Ambulatory Visit: Payer: Medicare HMO | Admitting: Urology

## 2018-08-08 ENCOUNTER — Encounter: Payer: Self-pay | Admitting: Urology

## 2018-08-08 VITALS — BP 127/74 | HR 76 | Ht 66.0 in | Wt 199.0 lb

## 2018-08-08 DIAGNOSIS — R351 Nocturia: Secondary | ICD-10-CM

## 2018-08-08 NOTE — Progress Notes (Signed)
08/08/2018 3:48 PM   Tina Fields 03/10/1946 [id number]  Referring provider: Hillery Aldo, MD [address] [place], [place] [address]  CC: Nocturia  HPI: I had the pleasure of seeing Tina Fields in urology clinic today in consultation for nocturia from Dr. Allena Katz.  She is a 72 year old otherwise relatively healthy female who reports nocturia every 2 hours overnight.  She denies any urinary symptoms at all during the day.  She does have diagnosed sleep apnea, but does not wear her CPAP.  She denies any gross hematuria or history of recurrent urinary tract infections.  She has mild to moderate lower extremity edema at the end of the day.  She does consume mostly soda and tea during the day, including during the evenings.  She also sleeps with a glass of water next to her bed.  There are no aggravating or alleviating factors.  Severity is mild to moderate.  Duration is up to a year.   PMH: Past Medical History:  Diagnosis Date  . Acid reflux   . Asthma   . Collagen vascular disease (HCC)    RA  . Edema of both legs   . Hypertension   . RA (rheumatoid arthritis) (HCC)     Surgical History: Past Surgical History:  Procedure Laterality Date  . TUBAL LIGATION      Allergies:  Allergies  Allergen Reactions  . Azithromycin Rash  . Latex Hives    Family History: Family History  Problem Relation Age of Onset  . CAD Mother   . Diabetes Mother   . Hypertension Mother   . CAD Father   . Diabetes Father   . Hypertension Father   . Stroke Father     Social History:  reports that she quit smoking about 23 years ago. She has never used smokeless tobacco. She reports that she does not drink alcohol or use drugs.  ROS: Please see flowsheet from today's date for complete review of systems.  Physical Exam: BP 127/74   Pulse 76   Ht 5\' 6"  (1.676 m)   Wt 199 lb (90.3 kg)   BMI 32.12 kg/m    Constitutional:  Alert and oriented, No acute  distress. Cardiovascular: No clubbing, cyanosis, or edema. Respiratory: Normal respiratory effort, no increased work of breathing. GI: Abdomen is soft, nontender, nondistended, no abdominal masses GU: No CVA tenderness Lymph: No cervical or inguinal lymphadenopathy. Skin: No rashes, bruises or suspicious lesions. Neurologic: Grossly intact, no focal deficits, moving all 4 extremities. Psychiatric: Normal mood and affect.  Laboratory Data: None to review  Pertinent Imaging: None to review  Assessment & Plan:   In summary, Tina Fields is a healthy 72 year old female with primary complaint of nocturia every 2 hours overnight with no symptoms during the day.  I feel very strongly that her symptoms are primarily related to her untreated sleep apnea, fluid intake in the evenings including sodas and teas as well as water overnight, and mobilization of lower extremity edema in the evenings.  We discussed behavioral strategies at length including minimizing coffee, soda, and tea in the diet as well as minimizing fluids after 6 PM and double voiding prior to bed.  In the future, could consider Nocdurna if her symptoms do not improve with behavioral therapies alone.  I offered her follow-up appointment in 6 weeks, however she would like to follow-up on an as-needed basis.  64, MD  [facility] [address] [place], [place]  [address] [phone]   

## 2018-09-03 DIAGNOSIS — R7303 Prediabetes: Secondary | ICD-10-CM | POA: Insufficient documentation

## 2018-09-12 ENCOUNTER — Emergency Department
Admission: EM | Admit: 2018-09-12 | Discharge: 2018-09-12 | Disposition: A | Discharge status: Home / Self Care | Payer: Medicare Other | Attending: Emergency Medicine | Admitting: Emergency Medicine

## 2018-09-12 ENCOUNTER — Other Ambulatory Visit: Payer: Self-pay

## 2018-09-12 DIAGNOSIS — R42 Dizziness and giddiness: Secondary | ICD-10-CM | POA: Insufficient documentation

## 2018-09-12 DIAGNOSIS — Z5321 Procedure and treatment not carried out due to patient leaving prior to being seen by health care provider: Secondary | ICD-10-CM | POA: Insufficient documentation

## 2018-09-12 LAB — URINALYSIS, COMPLETE (UACMP) WITH MICROSCOPIC
BILIRUBIN URINE: NEGATIVE
Bacteria, UA: NONE SEEN
Glucose, UA: NEGATIVE mg/dL
KETONES UR: NEGATIVE mg/dL
Nitrite: NEGATIVE
PH: 5 (ref 5.0–8.0)
Protein, ur: NEGATIVE mg/dL
SPECIFIC GRAVITY, URINE: 1.01 (ref 1.005–1.030)
WBC, UA: >50 WBC/hpf — ABNORMAL HIGH (ref 0–5)

## 2018-09-12 LAB — CBC WITH DIFFERENTIAL/PLATELET
Abs Immature Granulocytes: 0.04 10*3/uL (ref 0.00–0.07)
BASOS ABS: 0 10*3/uL (ref 0.0–0.1)
Basophils Relative: 0 %
EOS ABS: 0.2 10*3/uL (ref 0.0–0.5)
EOS PCT: 3 %
HEMATOCRIT: 37.9 % (ref 36.0–46.0)
Hemoglobin: 12.5 g/dL (ref 12.0–15.0)
IMMATURE GRANULOCYTES: 1 %
LYMPHS ABS: 2 10*3/uL (ref 0.7–4.0)
Lymphocytes Relative: 30 %
MCH: 30.4 pg (ref 26.0–34.0)
MCHC: 33 g/dL (ref 30.0–36.0)
MCV: 92.2 fL (ref 80.0–100.0)
Monocytes Absolute: 0.6 10*3/uL (ref 0.1–1.0)
Monocytes Relative: 9 %
NEUTROS PCT: 57 %
NRBC: 0 % (ref 0.0–0.2)
Neutro Abs: 3.7 10*3/uL (ref 1.7–7.7)
PLATELETS: 279 10*3/uL (ref 150–400)
RBC: 4.11 MIL/uL (ref 3.87–5.11)
RDW: 13.8 % (ref 11.5–15.5)
Smear Review: NORMAL
WBC MORPHOLOGY: REACTIVE
WBC: 6.5 10*3/uL (ref 4.0–10.5)

## 2018-09-12 LAB — COMPREHENSIVE METABOLIC PANEL
ALT: 11 U/L (ref 0–44)
AST: 17 U/L (ref 15–41)
Albumin: 3.3 g/dL — ABNORMAL LOW (ref 3.5–5.0)
Alkaline Phosphatase: 84 U/L (ref 38–126)
Anion gap: 7 (ref 5–15)
BILIRUBIN TOTAL: 0.5 mg/dL (ref 0.3–1.2)
BUN: 21 mg/dL (ref 8–23)
CHLORIDE: 108 mmol/L (ref 98–111)
CO2: 22 mmol/L (ref 22–32)
Calcium: 8.8 mg/dL — ABNORMAL LOW (ref 8.9–10.3)
Creatinine, Ser: 0.94 mg/dL (ref 0.44–1.00)
GFR calc Af Amer: >60 mL/min (ref >60)
GFR, EST NON AFRICAN AMERICAN: 59 mL/min — AB (ref >60)
Glucose, Bld: 125 mg/dL — ABNORMAL HIGH (ref 70–99)
POTASSIUM: 3.2 mmol/L — AB (ref 3.5–5.1)
Sodium: 137 mmol/L (ref 135–145)
TOTAL PROTEIN: 7.7 g/dL (ref 6.5–8.1)

## 2018-09-12 LAB — TROPONIN I

## 2018-09-12 NOTE — ED Triage Notes (Signed)
See paper chart for downtime charting 

## 2018-09-13 ENCOUNTER — Telehealth: Payer: Self-pay | Admitting: Emergency Medicine

## 2018-09-13 NOTE — Telephone Encounter (Signed)
Called patient due to lwot to inquire about condition and follow up plans. Says she felt better.  She has called her doctor already.

## 2018-12-03 DIAGNOSIS — E782 Mixed hyperlipidemia: Secondary | ICD-10-CM | POA: Insufficient documentation

## 2019-02-18 DIAGNOSIS — Z79899 Other long term (current) drug therapy: Secondary | ICD-10-CM | POA: Insufficient documentation

## 2019-10-04 ENCOUNTER — Emergency Department: Payer: Medicare Other

## 2019-10-04 ENCOUNTER — Encounter: Payer: Self-pay | Admitting: Emergency Medicine

## 2019-10-04 ENCOUNTER — Emergency Department
Admission: EM | Admit: 2019-10-04 | Discharge: 2019-10-04 | Disposition: A | Discharge status: Home / Self Care | Payer: Medicare Other | Attending: Emergency Medicine | Admitting: Emergency Medicine

## 2019-10-04 DIAGNOSIS — Z87891 Personal history of nicotine dependence: Secondary | ICD-10-CM | POA: Diagnosis not present

## 2019-10-04 DIAGNOSIS — J45909 Unspecified asthma, uncomplicated: Secondary | ICD-10-CM | POA: Insufficient documentation

## 2019-10-04 DIAGNOSIS — Z8673 Personal history of transient ischemic attack (TIA), and cerebral infarction without residual deficits: Secondary | ICD-10-CM | POA: Diagnosis not present

## 2019-10-04 DIAGNOSIS — R42 Dizziness and giddiness: Secondary | ICD-10-CM | POA: Insufficient documentation

## 2019-10-04 DIAGNOSIS — Z7982 Long term (current) use of aspirin: Secondary | ICD-10-CM | POA: Diagnosis not present

## 2019-10-04 DIAGNOSIS — R531 Weakness: Secondary | ICD-10-CM | POA: Diagnosis present

## 2019-10-04 DIAGNOSIS — I10 Essential (primary) hypertension: Secondary | ICD-10-CM | POA: Diagnosis not present

## 2019-10-04 LAB — COMPREHENSIVE METABOLIC PANEL
ALT: 10 U/L (ref 0–44)
AST: 15 U/L (ref 15–41)
Albumin: 3.1 g/dL — ABNORMAL LOW (ref 3.5–5.0)
Alkaline Phosphatase: 87 U/L (ref 38–126)
Anion gap: 9 (ref 5–15)
BUN: 16 mg/dL (ref 8–23)
CO2: 24 mmol/L (ref 22–32)
Calcium: 9.2 mg/dL (ref 8.9–10.3)
Chloride: 105 mmol/L (ref 98–111)
Creatinine, Ser: 0.9 mg/dL (ref 0.44–1.00)
GFR calc Af Amer: >60 mL/min (ref >60)
GFR calc non Af Amer: >60 mL/min (ref >60)
Glucose, Bld: 123 mg/dL — ABNORMAL HIGH (ref 70–99)
Potassium: 3.5 mmol/L (ref 3.5–5.1)
Sodium: 138 mmol/L (ref 135–145)
Total Bilirubin: 0.8 mg/dL (ref 0.3–1.2)
Total Protein: 8 g/dL (ref 6.5–8.1)

## 2019-10-04 LAB — CBC
HCT: 38.5 % (ref 36.0–46.0)
Hemoglobin: 12.6 g/dL (ref 12.0–15.0)
MCH: 30.6 pg (ref 26.0–34.0)
MCHC: 32.7 g/dL (ref 30.0–36.0)
MCV: 93.4 fL (ref 80.0–100.0)
Platelets: 319 10*3/uL (ref 150–400)
RBC: 4.12 MIL/uL (ref 3.87–5.11)
RDW: 14.9 % (ref 11.5–15.5)
WBC: 5.7 10*3/uL (ref 4.0–10.5)
nRBC: 0 % (ref 0.0–0.2)

## 2019-10-04 LAB — TROPONIN I (HIGH SENSITIVITY): Troponin I (High Sensitivity): 7 ng/L (ref <18)

## 2019-10-04 MED ORDER — LABETALOL HCL 5 MG/ML IV SOLN
10.0000 mg | Freq: Once | INTRAVENOUS | Status: AC
  Administered 2019-10-04: 10 mg via INTRAVENOUS
  Filled 2019-10-04: qty 4

## 2019-10-04 MED ORDER — LABETALOL HCL 5 MG/ML IV SOLN
20.0000 mg | Freq: Once | INTRAVENOUS | Status: AC
  Administered 2019-10-04: 20 mg via INTRAVENOUS
  Filled 2019-10-04: qty 4

## 2019-10-04 MED ORDER — SODIUM CHLORIDE 0.9 % IV BOLUS
500.0000 mL | Freq: Once | INTRAVENOUS | Status: AC
  Administered 2019-10-04: 09:00:00 500 mL via INTRAVENOUS

## 2019-10-04 NOTE — ED Triage Notes (Signed)
Pt to ED by EMS with c/o of weakness upon waking this morning.

## 2019-10-04 NOTE — ED Provider Notes (Signed)
[facility] Emergency Department Provider Note   ____________________________________________    I have reviewed the triage vital signs and the nursing notes.   HISTORY  Chief Complaint Weakness     HPI Tina Fields is a 74 y.o. female with history of hypertension, TIA who presents today with complaints of dizziness.  Patient reports intermittent episodes of dizziness over the last 6 weeks, has seen her primary care provider for this.  Denies headache.  Denies neuro deficits.  Reports when she got up this morning she felt okay, coming back to the bathroom sat on the bed and began to feel "shaky "and dizzy.  This appears to be similar episodes to what she is having over the last month and a half based on medical records.  No chest pain no palpitations.  No nausea or vomiting.  Feels well currently  Past Medical History:  Diagnosis Date  . Acid reflux   . Asthma   . Collagen vascular disease (HCC)    RA  . Edema of both legs   . Hypertension   . RA (rheumatoid arthritis) [facility])     Patient Active Problem List   Diagnosis Date Noted  . 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 HTN (hypertension) 08/21/2017  . GERD (gastroesophageal reflux disease) 08/21/2017    Past Surgical History:  Procedure Laterality Date  . TUBAL LIGATION      Prior to Admission medications   Medication Sig Start Date End Date Taking? Authorizing Provider  aspirin EC 81 MG tablet Take 1 tablet (81 mg total) by mouth daily. 08/21/17   Henreitta Leber, MD  diltiazem (CARDIZEM SR) 60 MG 12 hr capsule Take 60 mg by mouth 2 (two) times daily.    [provider]  fluticasone (FLONASE) 50 MCG/ACT nasal spray Place 2 sprays into both nostrils daily as needed for congestion. 06/06/17   [provider]  losartan (COZAAR) 100 MG tablet Take 100 mg by mouth daily.    [provider]  metoprolol succinate (TOPROL-XL) 25 MG 24 hr tablet Take 25 mg by  mouth daily.    [provider]  omeprazole (PRILOSEC) 20 MG capsule Take 20 mg by mouth 2 (two) times daily. 05/26/17   [provider]     Allergies Azithromycin and Latex  Family History  Problem Relation Age of Onset  . CAD Mother   . Diabetes Mother   . Hypertension Mother   . CAD Father   . Diabetes Father   . Hypertension Father   . Stroke Father     Social History Social History   Tobacco Use  . Smoking status: Former Smoker    Quit date: 01/09/1995    Years since quitting: 24.7  . Smokeless tobacco: Never Used  Substance Use Topics  . Alcohol use: No  . Drug use: No    Review of Systems  Constitutional: No fever/chills Eyes: No visual changes.  ENT: No neck pain Cardiovascular: Denies chest pain. Respiratory: Denies shortness of breath. Gastrointestinal: No abdominal pain.   Genitourinary: Negative for dysuria. Musculoskeletal: Negative for back pain. Skin: Negative for rash. Neurological: No headache, no neuro deficits   ____________________________________________   PHYSICAL EXAM:  VITAL SIGNS: ED Triage Vitals  Enc Vitals Group     BP 10/04/19 0844 (!) 220/104     Pulse Rate 10/04/19 0844 87     Resp 10/04/19 0844 18     Temp 10/04/19 0844 98.1 F (36.7 C)  Temp src --      SpO2 10/04/19 0844 96 %     Weight --      Height --      Head Circumference --      Peak Flow --      Pain Score 10/04/19 0845 0     Pain Loc --      Pain Edu? --      Excl. in GC? --     Constitutional: Alert and oriented.  Eyes: Conjunctivae are normal.  PERRLA, EOMI Head: Atraumatic. Nose: No congestion/rhinnorhea. Mouth/Throat: Mucous membranes are moist.   Neck:  Painless ROM Cardiovascular: Normal rate, regular rhythm. Grossly normal heart sounds.  Good peripheral circulation. Respiratory: Normal respiratory effort.  No retractions. Lungs CTAB. Gastrointestinal: Soft and nontender. No distention.    Musculoskeletal:  Warm and well  perfused Neurologic:  Normal speech and language. No gross focal neurologic deficits are appreciated.  Cranial nerves II through XII normal, normal strength in all extremities Skin:  Skin is warm, dry and intact. No rash noted. Psychiatric: Mood and affect are normal. Speech and behavior are normal.  ____________________________________________   LABS (all labs ordered are listed, but only abnormal results are displayed)  Labs Reviewed  COMPREHENSIVE METABOLIC PANEL - Abnormal; Notable for the following components:      Result Value   Glucose, Bld 123 (*)    Albumin 3.1 (*)    All other components within normal limits  CBC  TROPONIN I (HIGH SENSITIVITY)   ____________________________________________  EKG  ED ECG REPORT I, Jene Every, the attending physician, personally viewed and interpreted this ECG.  Date: 10/04/2019  Rhythm: normal sinus rhythm QRS Axis: normal Intervals: normal ST/T Wave abnormalities: normal Narrative Interpretation: no evidence of acute ischemia  ____________________________________________  RADIOLOGY CT head unremarkable ____________________________________________   PROCEDURES  Procedure(s) performed: No  Procedures   Critical Care performed:No ____________________________________________   INITIAL IMPRESSION / ASSESSMENT AND PLAN / ED COURSE  Pertinent labs & imaging results that were available during my care of the patient were reviewed by me and considered in my medical decision making (see chart for details).  Patient presents after episode of dizziness, seems to be improved now.  Vital signs remarkable for elevated blood pressure, she does have a history of hypertension.  We will recheck this to see if it is coming down on its own.  No neuro deficits, reassuring neuro exam.  EKG normal.  Pending labs, electrolytes, imaging  ----------------------------------------- 12:14 PM on  10/04/2019 -----------------------------------------  Lab work is unremarkable, imaging is unremarkable.  Patient now feels that her baseline, no dizziness, is ambulating well without issue.  Appropriate for discharge at this time.  We will have her follow-up closely with her PCP    ____________________________________________   FINAL CLINICAL IMPRESSION(S) / ED DIAGNOSES  Final diagnoses:  Dizziness        Note:  This document was prepared using Dragon voice recognition software and may include unintentional dictation errors.   Jene Every, MD 10/04/19 1215

## 2019-10-04 NOTE — ED Notes (Signed)
Gait assessment performed. Pt tolerated well. Able to ambulate around room without assistance or distress.  Uptaded on plan of care/verbalized understanding.  Will continue to monitor/reassess

## 2020-01-14 DIAGNOSIS — R42 Dizziness and giddiness: Secondary | ICD-10-CM | POA: Insufficient documentation

## 2020-01-16 ENCOUNTER — Other Ambulatory Visit: Payer: Self-pay | Admitting: Acute Care

## 2020-01-16 DIAGNOSIS — R42 Dizziness and giddiness: Secondary | ICD-10-CM

## 2020-01-22 ENCOUNTER — Other Ambulatory Visit: Payer: Self-pay | Admitting: Sports Medicine

## 2020-01-22 DIAGNOSIS — M25511 Pain in right shoulder: Secondary | ICD-10-CM

## 2020-01-22 DIAGNOSIS — M47812 Spondylosis without myelopathy or radiculopathy, cervical region: Secondary | ICD-10-CM

## 2020-01-22 DIAGNOSIS — M5030 Other cervical disc degeneration, unspecified cervical region: Secondary | ICD-10-CM

## 2020-01-22 DIAGNOSIS — M542 Cervicalgia: Secondary | ICD-10-CM

## 2020-01-22 DIAGNOSIS — G8929 Other chronic pain: Secondary | ICD-10-CM

## 2020-01-22 DIAGNOSIS — M25512 Pain in left shoulder: Secondary | ICD-10-CM

## 2020-01-23 ENCOUNTER — Ambulatory Visit: Admission: RE | Admit: 2020-01-23 | Payer: Medicare Other | Source: Ambulatory Visit

## 2020-01-26 ENCOUNTER — Ambulatory Visit
Admission: RE | Admit: 2020-01-26 | Discharge: 2020-01-26 | Disposition: A | Discharge status: Home / Self Care | Payer: Medicare Other | Source: Ambulatory Visit | Attending: Sports Medicine | Admitting: Sports Medicine

## 2020-01-26 ENCOUNTER — Other Ambulatory Visit: Payer: Self-pay

## 2020-01-26 DIAGNOSIS — M47812 Spondylosis without myelopathy or radiculopathy, cervical region: Secondary | ICD-10-CM

## 2020-01-26 DIAGNOSIS — M542 Cervicalgia: Secondary | ICD-10-CM

## 2020-01-26 DIAGNOSIS — M25511 Pain in right shoulder: Secondary | ICD-10-CM | POA: Insufficient documentation

## 2020-01-26 DIAGNOSIS — M25512 Pain in left shoulder: Secondary | ICD-10-CM | POA: Insufficient documentation

## 2020-01-26 DIAGNOSIS — G8929 Other chronic pain: Secondary | ICD-10-CM | POA: Insufficient documentation

## 2020-01-26 DIAGNOSIS — M5030 Other cervical disc degeneration, unspecified cervical region: Secondary | ICD-10-CM

## 2020-04-30 ENCOUNTER — Other Ambulatory Visit: Payer: Self-pay | Admitting: Sports Medicine

## 2020-04-30 DIAGNOSIS — M19011 Primary osteoarthritis, right shoulder: Secondary | ICD-10-CM

## 2020-04-30 DIAGNOSIS — G8929 Other chronic pain: Secondary | ICD-10-CM

## 2020-04-30 DIAGNOSIS — M7551 Bursitis of right shoulder: Secondary | ICD-10-CM

## 2020-04-30 DIAGNOSIS — M25511 Pain in right shoulder: Secondary | ICD-10-CM

## 2020-06-16 ENCOUNTER — Ambulatory Visit: Payer: Medicare HMO

## 2020-06-30 ENCOUNTER — Other Ambulatory Visit: Payer: Self-pay

## 2020-06-30 ENCOUNTER — Ambulatory Visit
Admission: RE | Admit: 2020-06-30 | Discharge: 2020-06-30 | Disposition: A | Discharge status: Home / Self Care | Payer: Medicare HMO | Source: Ambulatory Visit | Attending: Sports Medicine | Admitting: Sports Medicine

## 2020-06-30 DIAGNOSIS — G8929 Other chronic pain: Secondary | ICD-10-CM | POA: Insufficient documentation

## 2020-06-30 DIAGNOSIS — M19011 Primary osteoarthritis, right shoulder: Secondary | ICD-10-CM | POA: Diagnosis present

## 2020-06-30 DIAGNOSIS — M7551 Bursitis of right shoulder: Secondary | ICD-10-CM | POA: Diagnosis present

## 2020-06-30 DIAGNOSIS — M25511 Pain in right shoulder: Secondary | ICD-10-CM | POA: Diagnosis present

## 2020-10-08 ENCOUNTER — Ambulatory Visit: Payer: Self-pay | Admitting: *Deleted

## 2020-10-08 NOTE — Telephone Encounter (Signed)
Patient's son called to report patient has had increase pain in her side for the past few days and shoulder pain , arthritis. Called patient to assess symptoms . Patient c/o right side "catch" when she takes a deep breath in and right shoulder pain for a couple of days. Pain comes and goes. Denies difficulty breathing, pain in other areas of body. Patient reports inside her nose burns at times. Denies fever, abdominal pain, painful urination or blood in urine. Patient reports she is trying to take tylenol every 6 hours. Care advise given. Encouraged patient to try warm compress to right side and if pain continues to call PCP. Patient verbalized understanding of care advise and to call back or go to ED if symptoms worsen.   Reason for Disposition . [1] Mild flank pain AND [2] present < 3 days  Answer Assessment - Initial Assessment Questions 1. LOCATION: "Where does it hurt?" (e.g., left, right)     Right flank pain when breathing in 2. ONSET: "When did the pain start?"     A couple of days  3. SEVERITY: "How bad is the pain?" (e.g., Scale 1-10; mild, moderate, or severe)   - MILD (1-3): doesn't interfere with normal activities    - MODERATE (4-7): interferes with normal activities or awakens from sleep    - SEVERE (8-10): excruciating pain and patient unable to do normal activities (stays in bed)       Mild  4. PATTERN: "Does the pain come and go, or is it constant?"      Comes and goes  5. CAUSE: "What do you think is causing the pain?"     Not sure  6. OTHER SYMPTOMS:  "Do you have any other symptoms?" (e.g., fever, abdominal pain, vomiting, leg weakness, burning with urination, blood in urine)     Right shoulder pain 7. PREGNANCY:  "Is there any chance you are pregnant?" "When was your last menstrual period?"     na  Protocols used: FLANK PAIN-A-AH

## 2020-10-11 ENCOUNTER — Emergency Department: Payer: Medicare HMO

## 2020-10-11 ENCOUNTER — Other Ambulatory Visit: Payer: Self-pay

## 2020-10-11 ENCOUNTER — Emergency Department
Admission: EM | Admit: 2020-10-11 | Discharge: 2020-10-11 | Disposition: A | Discharge status: Home / Self Care | Payer: Medicare HMO | Attending: Emergency Medicine | Admitting: Emergency Medicine

## 2020-10-11 ENCOUNTER — Encounter: Payer: Self-pay | Admitting: *Deleted

## 2020-10-11 DIAGNOSIS — Z79899 Other long term (current) drug therapy: Secondary | ICD-10-CM | POA: Insufficient documentation

## 2020-10-11 DIAGNOSIS — R531 Weakness: Secondary | ICD-10-CM | POA: Insufficient documentation

## 2020-10-11 DIAGNOSIS — R55 Syncope and collapse: Secondary | ICD-10-CM | POA: Diagnosis not present

## 2020-10-11 DIAGNOSIS — J45909 Unspecified asthma, uncomplicated: Secondary | ICD-10-CM | POA: Insufficient documentation

## 2020-10-11 DIAGNOSIS — Z7952 Long term (current) use of systemic steroids: Secondary | ICD-10-CM | POA: Diagnosis not present

## 2020-10-11 DIAGNOSIS — Z87891 Personal history of nicotine dependence: Secondary | ICD-10-CM | POA: Insufficient documentation

## 2020-10-11 DIAGNOSIS — R202 Paresthesia of skin: Secondary | ICD-10-CM | POA: Diagnosis not present

## 2020-10-11 DIAGNOSIS — R42 Dizziness and giddiness: Secondary | ICD-10-CM | POA: Insufficient documentation

## 2020-10-11 DIAGNOSIS — Z7982 Long term (current) use of aspirin: Secondary | ICD-10-CM | POA: Insufficient documentation

## 2020-10-11 DIAGNOSIS — R519 Headache, unspecified: Secondary | ICD-10-CM | POA: Diagnosis not present

## 2020-10-11 DIAGNOSIS — I10 Essential (primary) hypertension: Secondary | ICD-10-CM | POA: Insufficient documentation

## 2020-10-11 LAB — CBC
HCT: 40.1 % (ref 36.0–46.0)
Hemoglobin: 13.4 g/dL (ref 12.0–15.0)
MCH: 32.5 pg (ref 26.0–34.0)
MCHC: 33.4 g/dL (ref 30.0–36.0)
MCV: 97.3 fL (ref 80.0–100.0)
Platelets: 285 10*3/uL (ref 150–400)
RBC: 4.12 MIL/uL (ref 3.87–5.11)
RDW: 14.1 % (ref 11.5–15.5)
WBC: 7.5 10*3/uL (ref 4.0–10.5)
nRBC: 0 % (ref 0.0–0.2)

## 2020-10-11 LAB — BASIC METABOLIC PANEL
Anion gap: 11 (ref 5–15)
BUN: 22 mg/dL (ref 8–23)
CO2: 23 mmol/L (ref 22–32)
Calcium: 8.8 mg/dL — ABNORMAL LOW (ref 8.9–10.3)
Chloride: 105 mmol/L (ref 98–111)
Creatinine, Ser: 0.72 mg/dL (ref 0.44–1.00)
GFR, Estimated: >60 mL/min (ref >60)
Glucose, Bld: 136 mg/dL — ABNORMAL HIGH (ref 70–99)
Potassium: 4.1 mmol/L (ref 3.5–5.1)
Sodium: 139 mmol/L (ref 135–145)

## 2020-10-11 LAB — CBG MONITORING, ED: Glucose-Capillary: 128 mg/dL — ABNORMAL HIGH (ref 70–99)

## 2020-10-11 LAB — TROPONIN I (HIGH SENSITIVITY): Troponin I (High Sensitivity): 14 ng/L (ref <18)

## 2020-10-11 NOTE — ED Provider Notes (Signed)
[facility] Emergency Department Provider Note  Time seen: 9:13 PM  I have reviewed the triage vital signs and the nursing notes.   HISTORY  Chief Complaint Weakness and Headache   HPI Tina Fields is a 75 y.o. female with a past medical history of reflux, asthma, hypertension, peripheral edema, presents to the emergency department with complaints of weakness and near syncope as well as tingling in arms and legs earlier.  Patient had a mild headache as well.  Currently patient appears well, denies any symptoms at this time and states she is feeling much better.  Largely negative review of systems including nausea vomiting diarrhea dysuria or recent illness.   Past Medical History:  Diagnosis Date  . Acid reflux   . Asthma   . Collagen vascular disease (HCC)    RA  . Edema of both legs   . Hypertension   . RA (rheumatoid arthritis) [facility])     Patient Active Problem List   Diagnosis Date Noted  . 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 HTN (hypertension) 08/21/2017  . GERD (gastroesophageal reflux disease) 08/21/2017    Past Surgical History:  Procedure Laterality Date  . TUBAL LIGATION      Prior to Admission medications   Medication Sig Start Date End Date Taking? Authorizing Provider  aspirin EC 81 MG tablet Take 1 tablet (81 mg total) by mouth daily. 08/21/17   Houston Siren, MD  diltiazem (CARDIZEM SR) 60 MG 12 hr capsule Take 60 mg by mouth 2 (two) times daily.    [provider]  fluticasone (FLONASE) 50 MCG/ACT nasal spray Place 2 sprays into both nostrils daily as needed for congestion. 06/06/17   [provider]  losartan (COZAAR) 100 MG tablet Take 100 mg by mouth daily.    [provider]  metoprolol succinate (TOPROL-XL) 25 MG 24 hr tablet Take 25 mg by mouth daily.    [provider]  omeprazole (PRILOSEC) 20 MG capsule Take 20 mg by mouth 2 (two) times daily. 05/26/17   [provider]    Allergies  Allergen Reactions  . Azithromycin Rash  . Latex Hives    Family History  Problem Relation Age of Onset  . CAD Mother   . Diabetes Mother   . Hypertension Mother   . CAD Father   . Diabetes Father   . Hypertension Father   . Stroke Father     Social History Social History   Tobacco Use  . Smoking status: Former Smoker    Quit date: 01/09/1995    Years since quitting: 25.7  . Smokeless tobacco: Never Used  Vaping Use  . Vaping Use: Never used  Substance Use Topics  . Alcohol use: No  . Drug use: No    Review of Systems Constitutional: Negative for fever.  Positive for generalized fatigue/weakness earlier today, now resolved Cardiovascular: Negative for chest pain. Respiratory: Negative for shortness of breath.  Negative for cough. Gastrointestinal: Negative for abdominal pain, vomiting and diarrhea. Genitourinary: Negative for urinary compaints Musculoskeletal: Negative for musculoskeletal complaints Neurological: Mild headache earlier today, now resolved All other ROS negative  ____________________________________________   PHYSICAL EXAM:  VITAL SIGNS: ED Triage Vitals  Enc Vitals Group     BP 10/11/20 1921 (!) 153/99     Pulse Rate 10/11/20 1921 99     Resp 10/11/20 1921 16     Temp 10/11/20 1921 98 F (36.7 C)     Temp Source 10/11/20  1921 Oral     SpO2 10/11/20 1921 100 %     Weight 10/11/20 1924 161 lb (73 kg)     Height 10/11/20 1924 5\' 6"  (1.676 m)     Head Circumference --      Peak Flow --      Pain Score 10/11/20 1923 7     Pain Loc --      Pain Edu? --      Excl. in GC? --     Constitutional: Alert and oriented. Well appearing and in no distress. Eyes: Normal exam ENT      Head: Normocephalic and atraumatic.      Mouth/Throat: Mucous membranes are moist. Cardiovascular: Normal rate, regular rhythm.  Respiratory: Normal respiratory effort without tachypnea nor retractions. Breath sounds are clear  Gastrointestinal:  Soft and nontender. No distention.   Musculoskeletal: Nontender with normal range of motion in all extremities. No lower extremity tenderness  Neurologic:  Normal speech and language. No gross focal neurologic deficits  Skin:  Skin is warm, dry and intact.  Psychiatric: Mood and affect are normal.  ____________________________________________    EKG  EKG viewed and interpreted by myself shows normal sinus rhythm at 93 bpm with a narrow QRS, normal axis, normal intervals.  Patient does have inferolateral T wave inversions.  Appears to be changed from 1 year ago prior EKG 10/04/2019.  ____________________________________________    RADIOLOGY  CT pending  ____________________________________________   INITIAL IMPRESSION / ASSESSMENT AND PLAN / ED COURSE  Pertinent labs & imaging results that were available during my care of the patient were reviewed by me and considered in my medical decision making (see chart for details).   Patient presents emergency department for episodes of dizziness she has been having.  States today she was having some tingling in her hands as well and a headache.  I reviewed the patient's record she saw her PCP approximately 2 to 3 weeks ago for similar episodes and had a head CT scheduled but has not yet been completed.  Patient denies any weakness or numbness of her arms or legs confusion or slurred speech at any point.  Denies any recent illnesses fever cough nausea vomiting diarrhea or dysuria.  Patient is EKG today does show inferolateral T wave inversions that appear new from a year ago.  I added on a troponin which has resulted negative.  Patient denies any shortness of breath or chest pain.  The remainder of the patient's lab work is reassuring as well.  Patient has reassuring physical exam.  If the patient CT scan is negative anticipate likely discharge home with outpatient follow-up with both her primary care doctor regarding ongoing episodes of dizziness as  well as cardiology given her change in EKG from 1 year ago.  CT pending, patient care signed out to oncoming provider.  Repeat EKG shows a normal sinus rhythm 82 bpm with a narrow QRS, normal axis, normal intervals, nonspecific ST changes T wave inversions are not as large as original EKG.  CT scan of the head is negative.  Given the patient's reassuring work-up physical exam I believe the patient is safe for discharge home.  Given her somewhat abnormal original EKG I did discuss follow-up with a cardiologist.  Patient agreeable to plan of care.  Shanara Schnieders was evaluated in Emergency Department on 10/11/2020 for the symptoms described in the history of present illness. She was evaluated in the context of the global COVID-19 pandemic, which necessitated consideration that  the patient might be at risk for infection with the SARS-CoV-2 virus that causes COVID-19. Institutional protocols and algorithms that pertain to the evaluation of patients at risk for COVID-19 are in a state of rapid change based on information released by regulatory bodies including the CDC and federal and state organizations. These policies and algorithms were followed during the patient's care in the ED.  ____________________________________________   FINAL CLINICAL IMPRESSION(S) / ED DIAGNOSES  Dizziness    Minna Antis, MD 10/11/20 2154

## 2020-10-11 NOTE — ED Triage Notes (Signed)
Pt comes into the ED via EMS from home with c/o near syncope at 3pm and had BL arm tingling, HA, went home took a nap still has a HA and concerned she my have had a stroke 190/90 #[id number] [id number] 98.3 90HR, a-fib with a hx

## 2020-10-11 NOTE — ED Notes (Signed)
Patient transported to CT 

## 2020-10-11 NOTE — ED Notes (Addendum)
ED Provider at bedside. Patient updated on plan of care for head CT.

## 2020-10-11 NOTE — ED Notes (Addendum)
Per Dr, Erma Heritage pts EKG is abnormal. Acuity changed and charge nurse made aware.

## 2020-10-11 NOTE — ED Triage Notes (Signed)
Pt to ED via EMS for previously documented near syncopal episode, headache and numbness. Pt reports the headache came on gradually. No blood thinner use. No acute neuro symptoms noted in triage. Pt started breathing heavy during EKG and when asked if she felt SOB pt reports intermittent SOB today without chest or back pain. No dizziness.

## 2020-10-21 ENCOUNTER — Other Ambulatory Visit: Payer: Self-pay | Admitting: Physician Assistant

## 2020-10-21 DIAGNOSIS — Z13820 Encounter for screening for osteoporosis: Secondary | ICD-10-CM

## 2020-10-28 DIAGNOSIS — Z8616 Personal history of COVID-19: Secondary | ICD-10-CM

## 2020-10-28 DIAGNOSIS — G4733 Obstructive sleep apnea (adult) (pediatric): Secondary | ICD-10-CM | POA: Insufficient documentation

## 2020-10-28 DIAGNOSIS — I5020 Unspecified systolic (congestive) heart failure: Secondary | ICD-10-CM | POA: Insufficient documentation

## 2020-10-28 HISTORY — DX: Personal history of COVID-19: Z86.16

## 2020-11-12 ENCOUNTER — Emergency Department: Payer: Medicare HMO

## 2020-11-12 ENCOUNTER — Encounter: Payer: Self-pay | Admitting: Emergency Medicine

## 2020-11-12 ENCOUNTER — Emergency Department
Admission: EM | Admit: 2020-11-12 | Discharge: 2020-11-12 | Disposition: A | Discharge status: Home / Self Care | Payer: Medicare HMO | Attending: Emergency Medicine | Admitting: Emergency Medicine

## 2020-11-12 ENCOUNTER — Other Ambulatory Visit: Payer: Self-pay

## 2020-11-12 DIAGNOSIS — H81399 Other peripheral vertigo, unspecified ear: Secondary | ICD-10-CM

## 2020-11-12 DIAGNOSIS — Z91040 Latex allergy status: Secondary | ICD-10-CM | POA: Diagnosis not present

## 2020-11-12 DIAGNOSIS — R519 Headache, unspecified: Secondary | ICD-10-CM | POA: Diagnosis not present

## 2020-11-12 DIAGNOSIS — Z79899 Other long term (current) drug therapy: Secondary | ICD-10-CM | POA: Diagnosis not present

## 2020-11-12 DIAGNOSIS — J45909 Unspecified asthma, uncomplicated: Secondary | ICD-10-CM | POA: Insufficient documentation

## 2020-11-12 DIAGNOSIS — R42 Dizziness and giddiness: Secondary | ICD-10-CM | POA: Diagnosis present

## 2020-11-12 DIAGNOSIS — I10 Essential (primary) hypertension: Secondary | ICD-10-CM | POA: Diagnosis not present

## 2020-11-12 DIAGNOSIS — Z87891 Personal history of nicotine dependence: Secondary | ICD-10-CM | POA: Insufficient documentation

## 2020-11-12 LAB — URINALYSIS, COMPLETE (UACMP) WITH MICROSCOPIC
Bacteria, UA: NONE SEEN
Bilirubin Urine: NEGATIVE
Glucose, UA: NEGATIVE mg/dL
Ketones, ur: NEGATIVE mg/dL
Nitrite: NEGATIVE
Protein, ur: NEGATIVE mg/dL
Specific Gravity, Urine: 1.018 (ref 1.005–1.030)
pH: 6 (ref 5.0–8.0)

## 2020-11-12 LAB — CBC
HCT: 40.2 % (ref 36.0–46.0)
Hemoglobin: 13 g/dL (ref 12.0–15.0)
MCH: 32 pg (ref 26.0–34.0)
MCHC: 32.3 g/dL (ref 30.0–36.0)
MCV: 99 fL (ref 80.0–100.0)
Platelets: 351 10*3/uL (ref 150–400)
RBC: 4.06 MIL/uL (ref 3.87–5.11)
RDW: 14.5 % (ref 11.5–15.5)
WBC: 5.2 10*3/uL (ref 4.0–10.5)
nRBC: 0 % (ref 0.0–0.2)

## 2020-11-12 LAB — BASIC METABOLIC PANEL
Anion gap: 9 (ref 5–15)
BUN: 24 mg/dL — ABNORMAL HIGH (ref 8–23)
CO2: 21 mmol/L — ABNORMAL LOW (ref 22–32)
Calcium: 9 mg/dL (ref 8.9–10.3)
Chloride: 108 mmol/L (ref 98–111)
Creatinine, Ser: 0.82 mg/dL (ref 0.44–1.00)
GFR, Estimated: >60 mL/min (ref >60)
Glucose, Bld: 145 mg/dL — ABNORMAL HIGH (ref 70–99)
Potassium: 3.6 mmol/L (ref 3.5–5.1)
Sodium: 138 mmol/L (ref 135–145)

## 2020-11-12 LAB — TROPONIN I (HIGH SENSITIVITY): Troponin I (High Sensitivity): 6 ng/L (ref <18)

## 2020-11-12 MED ORDER — MECLIZINE HCL 25 MG PO TABS
25.0000 mg | ORAL_TABLET | Freq: Once | ORAL | Status: AC
  Administered 2020-11-12: 25 mg via ORAL
  Filled 2020-11-12: qty 1

## 2020-11-12 MED ORDER — MIDAZOLAM HCL 2 MG/2ML IJ SOLN
2.0000 mg | Freq: Once | INTRAMUSCULAR | Status: AC
  Administered 2020-11-12: 2 mg via INTRAVENOUS
  Filled 2020-11-12: qty 2

## 2020-11-12 MED ORDER — SODIUM CHLORIDE 0.9 % IV BOLUS
500.0000 mL | Freq: Once | INTRAVENOUS | Status: AC
  Administered 2020-11-12: 500 mL via INTRAVENOUS

## 2020-11-12 MED ORDER — MECLIZINE HCL 25 MG PO TABS: 25.0000 mg | ORAL_TABLET | Freq: Three times a day (TID) | ORAL | 0 refills | Status: DC | PRN

## 2020-11-12 NOTE — ED Notes (Signed)
Patient transported to MRI 

## 2020-11-12 NOTE — Discharge Instructions (Signed)
Return to the ER for new, worsening, or persistent severe dizziness, severe headache, vomiting, weakness, inability to walk or keep balance, problems with your speech or vision, or any other new or worsening symptoms that concern you.    Follow-up with your regular doctor within the next 1 to 2 weeks.    You may take the meclizine as needed up to 3 times daily for the vertigo.

## 2020-11-12 NOTE — ED Notes (Signed)
Patient transported to CT 

## 2020-11-12 NOTE — ED Triage Notes (Signed)
Pt comes into the ED via POV c/o dizziness that started when she woke up this morning.  PT states she went to bed feeling fine and sine then has woken up not feeling normal.  Pt states that the room is spinning.  Pt is able to answer all questions at this time appropriately.  Pt states she has a history of vertigo in the past.  Pt also admits she had COVID 3 weeks ago with congestion.  Pt in NAD at this time with even and unlabored respirations.

## 2020-11-12 NOTE — ED Provider Notes (Signed)
[facility] Emergency Department Provider Note ____________________________________________   Event Date/Time   First MD Initiated Contact with Patient 11/12/20 1630     (approximate)  I have reviewed the triage vital signs and the nursing notes.   HISTORY  Chief Complaint Dizziness    HPI Tina Fields is a 75 y.o. female with PMH as noted below including a prior history of vertigo who presents with dizziness, acute onset when she got up this morning, described as a sensation of movement or spinning.  The patient states that she feels off balance and has to hold onto things to walk.  It is associated with nausea but no vomiting.  She reports a mild headache.  The patient states that the symptoms subside when she sits very still.  She was going to an eye doctor appointment being driven by her son when she started to feel more dizzy with each turn and decided to come to the hospital instead.  She denies any vision changes.  She has no acute chest pain or shortness of breath although does report some chronic shortness of breath related to CHF.  She states she had vertigo a few times in the past but states that this is more severe and long-lasting.   Past Medical History:  Diagnosis Date  . Acid reflux   . Asthma   . Collagen vascular disease (HCC)    RA  . Edema of both legs   . Hypertension   . RA (rheumatoid arthritis) [facility])     Patient Active Problem List   Diagnosis Date Noted  . 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 HTN (hypertension) 08/21/2017  . GERD (gastroesophageal reflux disease) 08/21/2017    Past Surgical History:  Procedure Laterality Date  . TUBAL LIGATION      Prior to Admission medications   Medication Sig Start Date End Date Taking? Authorizing Provider  albuterol (VENTOLIN HFA) 108 (90 Base) MCG/ACT inhaler Inhale 1-2 puffs into the lungs every 6 (six) hours as needed. 11/06/20  Yes [provider]  [facility]  VITAMIN B12 1000 MCG tablet Take 1,000 mcg by mouth daily. 08/04/20  Yes [provider]  ENTRESTO 24-26 MG Take 1 tablet by mouth daily. 11/02/20  Yes [provider]  furosemide (LASIX) 40 MG tablet Take as needed for weight gain 10/29/20  Yes [provider]  meclizine (ANTIVERT) 25 MG tablet Take 1 tablet (25 mg total) by mouth 3 (three) times daily as needed for dizziness. 11/12/20  Yes Dionne Bucy, MD  meloxicam (MOBIC) 15 MG tablet Take 15 mg by mouth daily. 11/10/20  Yes [provider]  metoprolol succinate (TOPROL-XL) 25 MG 24 hr tablet Take 25 mg by mouth daily.   Yes [provider]  olmesartan (BENICAR) 40 MG tablet Take 40 mg by mouth daily. 09/02/20  Yes [provider]  spironolactone (ALDACTONE) 25 MG tablet Take 12.5 mg by mouth daily. 10/29/20  Yes [provider]  TIADYLT ER 360 MG 24 hr capsule Take 360 mg by mouth daily. 10/13/20  Yes [provider]  Vitamin D, Ergocalciferol, (DRISDOL) 1.25 MG (50000 UNIT) CAPS capsule Take 50,000 Units by mouth every 7 (seven) days. 09/11/20  Yes [provider]    Allergies Azithromycin and Latex  Family History  Problem Relation Age of Onset  . CAD Mother   . Diabetes Mother   . Hypertension Mother   . CAD Father   . Diabetes Father   . Hypertension  Father   . Stroke Father     Social History Social History   Tobacco Use  . Smoking status: Former Smoker    Quit date: 01/09/1995    Years since quitting: 25.8  . Smokeless tobacco: Never Used  Vaping Use  . Vaping Use: Never used  Substance Use Topics  . Alcohol use: No  . Drug use: No    Review of Systems  Constitutional: No fever. Eyes: No visual changes. ENT: No sore throat. Cardiovascular: Denies chest pain. Respiratory: Denies shortness of breath. Gastrointestinal: Positive for nausea. Genitourinary: Negative for dysuria.  Musculoskeletal: Negative for back pain. Skin: Negative  for rash. Neurological: Negative for focal weakness or numbness.   ____________________________________________   PHYSICAL EXAM:  VITAL SIGNS: ED Triage Vitals  Enc Vitals Group     BP 11/12/20 1454 (!) 155/86     Pulse Rate 11/12/20 1454 99     Resp 11/12/20 1454 18     Temp 11/12/20 1454 (!) 97.5 F (36.4 C)     Temp Source 11/12/20 1454 Oral     SpO2 11/12/20 1454 97 %     Weight 11/12/20 1454 173 lb (78.5 kg)     Height 11/12/20 1454 5\' 6"  (1.676 m)     Head Circumference --      Peak Flow --      Pain Score 11/12/20 1503 0     Pain Loc --      Pain Edu? --      Excl. in GC? --     Constitutional: Alert and oriented. Well appearing and in no acute distress. Eyes: Conjunctivae are normal.  EOMI.  PERRLA.  No nystagmus. Head: Atraumatic.  Bilateral ear canals and TMs normal. Nose: No congestion/rhinnorhea. Mouth/Throat: Mucous membranes are moist.   Neck: Normal range of motion.  Cardiovascular: Normal rate, regular rhythm. Grossly normal heart sounds.  Good peripheral circulation. Respiratory: Normal respiratory effort.  No retractions. Lungs CTAB. Gastrointestinal: No distention.  Musculoskeletal: No lower extremity edema.  Extremities warm and well perfused.  Neurologic:  Normal speech and language.  5/5 motor strength and intact sensation all extremities.  Normal coordination with mild tremor but no ataxia on finger-to-nose.  Normal heel to shin.  No pronator drift.  No facial droop.  Skin:  Skin is warm and dry. No rash noted. Psychiatric: Mood and affect are normal. Speech and behavior are normal.  ____________________________________________   LABS (all labs ordered are listed, but only abnormal results are displayed)  Labs Reviewed  BASIC METABOLIC PANEL - Abnormal; Notable for the following components:      Result Value   CO2 21 (*)    Glucose, Bld 145 (*)    BUN 24 (*)    All other components within normal limits  URINALYSIS, COMPLETE (UACMP) WITH  MICROSCOPIC - Abnormal; Notable for the following components:   Color, Urine YELLOW (*)    APPearance HAZY (*)    Hgb urine dipstick SMALL (*)    Leukocytes,Ua TRACE (*)    All other components within normal limits  CBC  TROPONIN I (HIGH SENSITIVITY)   ____________________________________________  EKG  ED ECG REPORT I, Dionne Bucy, the attending physician, personally viewed and interpreted this ECG.  Date: 11/12/2020 EKG Time: 1500 Rate: 94 Rhythm: normal sinus rhythm QRS Axis: normal Intervals: normal ST/T Wave abnormalities: LVH with nonspecific ST abnormalities Narrative Interpretation: Nonspecific abnormalities with no evidence of acute ischemia; no significant change when compared to EKG of 10/11/2020  ____________________________________________  RADIOLOGY  CT head: No acute abnormality MR brain: No acute abnormality.  Chronic appearing microhemorrhages.  ____________________________________________   PROCEDURES  Procedure(s) performed: No  Procedures  Critical Care performed: No ____________________________________________   INITIAL IMPRESSION / ASSESSMENT AND PLAN / ED COURSE  Pertinent labs & imaging results that were available during my care of the patient were reviewed by me and considered in my medical decision making (see chart for details).  75 year old female with PMH as noted above including a recent diagnosis of CHF and a past history of vertigo presents with dizziness since she got up this morning, described as a spinning or loss of balance sensation and worsened by any movement.  She states it is more severe and long-lasting than when she had vertigo previously.  I reviewed the past medical records in Epic.  The patient was most recently seen in the ED last month with an episode of weakness and near syncope and a negative work-up.  She was seen in January with an episode of dizziness and shakiness that resolved on its own.  She was last  admitted in 2018 with a TIA that presented with right upper extremity numbness.  On exam, the patient is overall well-appearing.  Her vital signs are normal except for mild hypertension.  Neurologic exam is normal except that she has a mild tremor especially in the upper extremities.  She states that she has been feeling shaky since this started today.  However she has no ataxia.  The exam is otherwise unremarkable.  Overall presentation is consistent with peripheral vertigo given the acute onset, severity of symptoms, and the fact that the extinguished when she is still.  However given the patient's age and prior TIA history differential also includes primary CNS cause.  We will obtain a CT, troponin, basic labs, give fluids and meclizine.   ----------------------------------------- 10:35 PM on 11/12/2020 -----------------------------------------  CT and lab work-up were unremarkable.  The patient has some WBCs on the urinalysis but is asymptomatic and has no bacteria and no serum WBC elevation.  There is no clinical evidence for UTI.  MRI was obtained and is also unremarkable for any acute abnormalities.    Presentation is consistent with peripheral vertigo.  The patient is feeling much better.  Her dizziness has resolved.  She was able to eat a meal.  She wants to go home.  She is stable for discharge at this time.  I counseled her on the results of the work-up.  I recommended for her to follow-up with her PMD.  I have prescribed meclizine.  Return precautions given, and she expresses understanding.  ____________________________________________   FINAL CLINICAL IMPRESSION(S) / ED DIAGNOSES  Final diagnoses:  Peripheral vertigo, unspecified laterality      NEW MEDICATIONS STARTED DURING THIS VISIT:  New Prescriptions   MECLIZINE (ANTIVERT) 25 MG TABLET    Take 1 tablet (25 mg total) by mouth 3 (three) times daily as needed for dizziness.     Note:  This document was prepared using  Dragon voice recognition software and may include unintentional dictation errors.    Dionne Bucy, MD 11/12/20 2237

## 2021-01-02 ENCOUNTER — Inpatient Hospital Stay
Admission: EM | Admit: 2021-01-02 | Discharge: 2021-01-05 | DRG: 291 | Disposition: A | Discharge status: Home Health | Payer: Medicare HMO | Attending: Internal Medicine | Admitting: Internal Medicine

## 2021-01-02 ENCOUNTER — Emergency Department: Payer: Medicare HMO

## 2021-01-02 ENCOUNTER — Encounter: Payer: Self-pay | Admitting: Internal Medicine

## 2021-01-02 ENCOUNTER — Other Ambulatory Visit: Payer: Self-pay

## 2021-01-02 DIAGNOSIS — J9601 Acute respiratory failure with hypoxia: Secondary | ICD-10-CM

## 2021-01-02 DIAGNOSIS — J810 Acute pulmonary edema: Secondary | ICD-10-CM | POA: Diagnosis not present

## 2021-01-02 DIAGNOSIS — Z8249 Family history of ischemic heart disease and other diseases of the circulatory system: Secondary | ICD-10-CM

## 2021-01-02 DIAGNOSIS — Z823 Family history of stroke: Secondary | ICD-10-CM

## 2021-01-02 DIAGNOSIS — Z888 Allergy status to other drugs, medicaments and biological substances status: Secondary | ICD-10-CM | POA: Diagnosis not present

## 2021-01-02 DIAGNOSIS — Z833 Family history of diabetes mellitus: Secondary | ICD-10-CM | POA: Diagnosis not present

## 2021-01-02 DIAGNOSIS — I10 Essential (primary) hypertension: Secondary | ICD-10-CM | POA: Diagnosis present

## 2021-01-02 DIAGNOSIS — Z87891 Personal history of nicotine dependence: Secondary | ICD-10-CM | POA: Diagnosis not present

## 2021-01-02 DIAGNOSIS — M069 Rheumatoid arthritis, unspecified: Secondary | ICD-10-CM | POA: Diagnosis present

## 2021-01-02 DIAGNOSIS — R531 Weakness: Secondary | ICD-10-CM | POA: Diagnosis present

## 2021-01-02 DIAGNOSIS — R2689 Other abnormalities of gait and mobility: Secondary | ICD-10-CM | POA: Diagnosis present

## 2021-01-02 DIAGNOSIS — Z20822 Contact with and (suspected) exposure to covid-19: Secondary | ICD-10-CM | POA: Diagnosis present

## 2021-01-02 DIAGNOSIS — Z9114 Patient's other noncompliance with medication regimen: Secondary | ICD-10-CM | POA: Diagnosis not present

## 2021-01-02 DIAGNOSIS — K219 Gastro-esophageal reflux disease without esophagitis: Secondary | ICD-10-CM | POA: Diagnosis present

## 2021-01-02 DIAGNOSIS — I509 Heart failure, unspecified: Secondary | ICD-10-CM

## 2021-01-02 DIAGNOSIS — I959 Hypotension, unspecified: Secondary | ICD-10-CM | POA: Diagnosis present

## 2021-01-02 DIAGNOSIS — E876 Hypokalemia: Secondary | ICD-10-CM | POA: Diagnosis present

## 2021-01-02 DIAGNOSIS — Z79899 Other long term (current) drug therapy: Secondary | ICD-10-CM

## 2021-01-02 DIAGNOSIS — I110 Hypertensive heart disease with heart failure: Secondary | ICD-10-CM | POA: Diagnosis present

## 2021-01-02 DIAGNOSIS — I5043 Acute on chronic combined systolic (congestive) and diastolic (congestive) heart failure: Secondary | ICD-10-CM | POA: Diagnosis present

## 2021-01-02 DIAGNOSIS — J45909 Unspecified asthma, uncomplicated: Secondary | ICD-10-CM | POA: Diagnosis present

## 2021-01-02 DIAGNOSIS — Z91040 Latex allergy status: Secondary | ICD-10-CM | POA: Diagnosis not present

## 2021-01-02 DIAGNOSIS — I2510 Atherosclerotic heart disease of native coronary artery without angina pectoris: Secondary | ICD-10-CM | POA: Diagnosis present

## 2021-01-02 DIAGNOSIS — Z7951 Long term (current) use of inhaled steroids: Secondary | ICD-10-CM

## 2021-01-02 DIAGNOSIS — Z791 Long term (current) use of non-steroidal anti-inflammatories (NSAID): Secondary | ICD-10-CM

## 2021-01-02 LAB — COMPREHENSIVE METABOLIC PANEL
ALT: 7 U/L (ref 0–44)
AST: 16 U/L (ref 15–41)
Albumin: 3.1 g/dL — ABNORMAL LOW (ref 3.5–5.0)
Alkaline Phosphatase: 71 U/L (ref 38–126)
Anion gap: 9 (ref 5–15)
BUN: 16 mg/dL (ref 8–23)
CO2: 24 mmol/L (ref 22–32)
Calcium: 8.9 mg/dL (ref 8.9–10.3)
Chloride: 105 mmol/L (ref 98–111)
Creatinine, Ser: 0.77 mg/dL (ref 0.44–1.00)
GFR, Estimated: >60 mL/min (ref >60)
Glucose, Bld: 112 mg/dL — ABNORMAL HIGH (ref 70–99)
Potassium: 3.6 mmol/L (ref 3.5–5.1)
Sodium: 138 mmol/L (ref 135–145)
Total Bilirubin: 1 mg/dL (ref 0.3–1.2)
Total Protein: 7.6 g/dL (ref 6.5–8.1)

## 2021-01-02 LAB — CBC WITH DIFFERENTIAL/PLATELET
Abs Immature Granulocytes: 0.01 10*3/uL (ref 0.00–0.07)
Basophils Absolute: 0 10*3/uL (ref 0.0–0.1)
Basophils Relative: 1 %
Eosinophils Absolute: 0 10*3/uL (ref 0.0–0.5)
Eosinophils Relative: 0 %
HCT: 38.3 % (ref 36.0–46.0)
Hemoglobin: 12.6 g/dL (ref 12.0–15.0)
Immature Granulocytes: 0 %
Lymphocytes Relative: 28 %
Lymphs Abs: 1.3 10*3/uL (ref 0.7–4.0)
MCH: 32 pg (ref 26.0–34.0)
MCHC: 32.9 g/dL (ref 30.0–36.0)
MCV: 97.2 fL (ref 80.0–100.0)
Monocytes Absolute: 0.4 10*3/uL (ref 0.1–1.0)
Monocytes Relative: 9 %
Neutro Abs: 2.8 10*3/uL (ref 1.7–7.7)
Neutrophils Relative %: 62 %
Platelets: 312 10*3/uL (ref 150–400)
RBC: 3.94 MIL/uL (ref 3.87–5.11)
RDW: 14.2 % (ref 11.5–15.5)
WBC: 4.6 10*3/uL (ref 4.0–10.5)
nRBC: 0 % (ref 0.0–0.2)

## 2021-01-02 LAB — RESP PANEL BY RT-PCR (FLU A&B, COVID) ARPGX2
Influenza A by PCR: NEGATIVE
Influenza B by PCR: NEGATIVE
SARS Coronavirus 2 by RT PCR: NEGATIVE

## 2021-01-02 LAB — TROPONIN I (HIGH SENSITIVITY)
Troponin I (High Sensitivity): 13 ng/L (ref <18)
Troponin I (High Sensitivity): 16 ng/L (ref <18)

## 2021-01-02 LAB — BRAIN NATRIURETIC PEPTIDE: B Natriuretic Peptide: 1821.9 pg/mL — ABNORMAL HIGH (ref 0.0–100.0)

## 2021-01-02 MED ORDER — ALBUTEROL SULFATE HFA 108 (90 BASE) MCG/ACT IN AERS
1.0000 | INHALATION_SPRAY | Freq: Four times a day (QID) | RESPIRATORY_TRACT | Status: DC | PRN
  Filled 2021-01-02: qty 6.7

## 2021-01-02 MED ORDER — SODIUM CHLORIDE 0.9 % IV SOLN: 250.0000 mL | INTRAVENOUS | Status: DC | PRN

## 2021-01-02 MED ORDER — ONDANSETRON HCL 4 MG/2ML IJ SOLN: 4.0000 mg | Freq: Four times a day (QID) | INTRAMUSCULAR | Status: DC | PRN

## 2021-01-02 MED ORDER — METOPROLOL SUCCINATE ER 50 MG PO TB24
25.0000 mg | ORAL_TABLET | Freq: Every day | ORAL | Status: DC
  Administered 2021-01-03 – 2021-01-05 (×3): 25 mg via ORAL
  Filled 2021-01-02 (×3): qty 1

## 2021-01-02 MED ORDER — SODIUM CHLORIDE 0.9% FLUSH
3.0000 mL | INTRAVENOUS | Status: DC | PRN
  Administered 2021-01-03: 3 mL via INTRAVENOUS

## 2021-01-02 MED ORDER — FUROSEMIDE 10 MG/ML IJ SOLN
40.0000 mg | Freq: Once | INTRAMUSCULAR | Status: AC
  Administered 2021-01-02: 40 mg via INTRAVENOUS
  Filled 2021-01-02: qty 4

## 2021-01-02 MED ORDER — VITAMIN B-12 1000 MCG PO TABS
1000.0000 ug | ORAL_TABLET | Freq: Every day | ORAL | Status: DC
  Administered 2021-01-03 – 2021-01-05 (×3): 1000 ug via ORAL
  Filled 2021-01-02 (×3): qty 1

## 2021-01-02 MED ORDER — VITAMIN D (ERGOCALCIFEROL) 1.25 MG (50000 UNIT) PO CAPS: 50000.0000 [IU] | ORAL_CAPSULE | ORAL | Status: DC

## 2021-01-02 MED ORDER — SACUBITRIL-VALSARTAN 24-26 MG PO TABS
1.0000 | ORAL_TABLET | Freq: Every day | ORAL | Status: DC
  Administered 2021-01-03 – 2021-01-05 (×3): 1 via ORAL
  Filled 2021-01-02 (×3): qty 1

## 2021-01-02 MED ORDER — FUROSEMIDE 10 MG/ML IJ SOLN
40.0000 mg | Freq: Two times a day (BID) | INTRAMUSCULAR | Status: DC
  Administered 2021-01-03 – 2021-01-05 (×5): 40 mg via INTRAVENOUS
  Filled 2021-01-02 (×5): qty 4

## 2021-01-02 MED ORDER — SODIUM CHLORIDE 0.9% FLUSH
3.0000 mL | Freq: Two times a day (BID) | INTRAVENOUS | Status: DC
  Administered 2021-01-02 – 2021-01-05 (×6): 3 mL via INTRAVENOUS

## 2021-01-02 MED ORDER — MECLIZINE HCL 25 MG PO TABS
25.0000 mg | ORAL_TABLET | Freq: Three times a day (TID) | ORAL | Status: DC | PRN
  Filled 2021-01-02: qty 1

## 2021-01-02 MED ORDER — SPIRONOLACTONE 25 MG PO TABS
12.5000 mg | ORAL_TABLET | Freq: Every day | ORAL | Status: DC
  Administered 2021-01-03 – 2021-01-05 (×3): 12.5 mg via ORAL
  Filled 2021-01-02: qty 1
  Filled 2021-01-02: qty 0.5
  Filled 2021-01-02 (×2): qty 1
  Filled 2021-01-02 (×2): qty 0.5

## 2021-01-02 MED ORDER — DILTIAZEM HCL ER BEADS 240 MG PO CP24
360.0000 mg | ORAL_CAPSULE | Freq: Every day | ORAL | Status: DC
  Filled 2021-01-02 (×2): qty 1

## 2021-01-02 MED ORDER — ACETAMINOPHEN 325 MG PO TABS
650.0000 mg | ORAL_TABLET | ORAL | Status: DC | PRN
  Administered 2021-01-03: 650 mg via ORAL
  Filled 2021-01-02: qty 2

## 2021-01-02 MED ORDER — ENOXAPARIN SODIUM 40 MG/0.4ML IJ SOSY
40.0000 mg | PREFILLED_SYRINGE | INTRAMUSCULAR | Status: DC
  Administered 2021-01-03 – 2021-01-04 (×2): 40 mg via SUBCUTANEOUS
  Filled 2021-01-02 (×2): qty 0.4

## 2021-01-02 MED ORDER — ASPIRIN EC 81 MG PO TBEC
81.0000 mg | DELAYED_RELEASE_TABLET | Freq: Every day | ORAL | Status: DC
  Administered 2021-01-03 – 2021-01-05 (×3): 81 mg via ORAL
  Filled 2021-01-02 (×3): qty 1

## 2021-01-02 NOTE — ED Triage Notes (Signed)
Pt via Ems from home with Shortness of breath. Pt recently diagnosed with CHF. Pt o2 saturation 90% for Ems placed on 4L O2 increased to 97%. Per Ems pt in and out of Afib Hr 72-104. Pt with increased work of breathing on arrival pt tried off oxygen saturation 90-91% on room air. Pt placed on 2 L o2. Pt denies any chest pain.

## 2021-01-02 NOTE — Plan of Care (Signed)
  Problem: Activity: Goal: Capacity to carry out activities will improve Outcome: Progressing   Problem: Cardiac: Goal: Ability to achieve and maintain adequate cardiopulmonary perfusion will improve Outcome: Progressing   Problem: Elimination: Goal: Will not experience complications related to bowel motility Outcome: Progressing Goal: Will not experience complications related to urinary retention Outcome: Progressing   Problem: Pain Managment: Goal: General experience of comfort will improve Outcome: Progressing   Problem: Safety: Goal: Ability to remain free from injury will improve Outcome: Progressing   Problem: Skin Integrity: Goal: Risk for impaired skin integrity will decrease Outcome: Progressing

## 2021-01-02 NOTE — ED Notes (Signed)
Patient is resting comfortably. Dinner tray offered. Pt states not hungry at this time

## 2021-01-02 NOTE — ED Notes (Signed)
P 

## 2021-01-02 NOTE — H&P (Signed)
History and Physical    Tina Fields JGG:[id number] DOB: 1946-08-08 DOA: 01/02/2021  PCP: Oswaldo Conroy, MD   Patient coming from: Home  I have personally briefly reviewed patient's old medical records in [facility]  Chief Complaint: Shortness of breath  HPI: Tina Fields is a 75 y.o. female with medical history significant for rheumatoid arthritis, hypertension, GERD, chronic combined systolic and diastolic dysfunction CHF who presents to the ER via EMS for evaluation of shortness of breath.  Patient states her symptoms started this morning at rest.  She also has some mild lower extremity swelling but denies having any chest pain, no orthopnea or paroxysmal nocturnal dyspnea.  She admits to dietary indiscretion and has been drinking a lot of sodas recently. When EMS arrived she was found to have room air pulse oximetry of 90% and was placed on 4 L of oxygen with improvement in her pulse oximetry to 97%. She denies having any cough, no fever, no chills, no abdominal pain, no changes in her bowel habits, no headache, no dizziness, no lightheadedness, no blurry vision, no urinary symptoms, no nausea, no vomiting, no diaphoresis or palpitations. Labs show sodium 138, potassium 3.6, chloride 105, bicarb 24, glucose 112, BUN 16, creatinine 0.7, calcium 8.9, alkaline phosphatase 71, albumin 3.1, AST 16, ALT 7, total protein 7.6, BNP 1821, troponin 13, white count 4.6, hemoglobin 12.6, hematocrit 38.3, MCV 97.2, RDW 14.2, platelet count 312 Respiratory viral panel is pending Chest x-ray reviewed by me shows congestive heart failure Twelve-lead EKG reviewed by me shows sinus tachycardia   ED Course: Patient is a 75 year old African-American female who presents to the emergency room via EMS for evaluation of sudden onset shortness of breath which started at rest associated with lower extremity swelling.  She has a known history of CHF and admits to dietary indiscretion.   Patient's blood pressure was elevated upon arrival to the ER with systolic blood pressure in the 170s.  Last 2D echocardiogram which was done 02/22 showed an LVEF of 30 to 35% with grade 2 diastolic dysfunction.  She received a dose of Lasix in the ER and will be admitted to the hospital for further evaluation.  Review of Systems: As per HPI otherwise all other systems reviewed and negative.    Past Medical History:  Diagnosis Date  . Acid reflux   . Asthma   . Collagen vascular disease (HCC)    RA  . Edema of both legs   . Hypertension   . RA (rheumatoid arthritis) (HCC)     Past Surgical History:  Procedure Laterality Date  . TUBAL LIGATION       reports that she quit smoking about 26 years ago. She has never used smokeless tobacco. She reports that she does not drink alcohol and does not use drugs.  Allergies  Allergen Reactions  . Azithromycin Rash  . Latex Hives    Family History  Problem Relation Age of Onset  . CAD Mother   . Diabetes Mother   . Hypertension Mother   . CAD Father   . Diabetes Father   . Hypertension Father   . Stroke Father       Prior to Admission medications   Medication Sig Start Date End Date Taking? Authorizing Provider  albuterol (VENTOLIN HFA) 108 (90 Base) MCG/ACT inhaler Inhale 1-2 puffs into the lungs every 6 (six) hours as needed. 11/06/20   [provider]  [employer] VITAMIN B12 1000 MCG tablet Take 1,000 mcg  by mouth daily. 08/04/20   [provider]  ENTRESTO 24-26 MG Take 1 tablet by mouth daily. 11/02/20   [provider]  furosemide (LASIX) 40 MG tablet Take as needed for weight gain 10/29/20   [provider]  meclizine (ANTIVERT) 25 MG tablet Take 1 tablet (25 mg total) by mouth 3 (three) times daily as needed for dizziness. 11/12/20   Dionne Bucy, MD  meloxicam (MOBIC) 15 MG tablet Take 15 mg by mouth daily. 11/10/20   [provider]  metoprolol succinate (TOPROL-XL) 25 MG 24 hr tablet  Take 25 mg by mouth daily.    [provider]  olmesartan (BENICAR) 40 MG tablet Take 40 mg by mouth daily. 09/02/20   [provider]  spironolactone (ALDACTONE) 25 MG tablet Take 12.5 mg by mouth daily. 10/29/20   [provider]  TIADYLT ER 360 MG 24 hr capsule Take 360 mg by mouth daily. 10/13/20   [provider]  Vitamin D, Ergocalciferol, (DRISDOL) 1.25 MG (50000 UNIT) CAPS capsule Take 50,000 Units by mouth every 7 (seven) days. 09/11/20   [provider]    Physical Exam: Vitals:   01/02/21 1517 01/02/21 1518 01/02/21 1519 01/02/21 1622  BP:  (!) 171/106  (!) 149/94  Pulse:  77  96  Resp:  (!) 22  (!) 28  Temp:  97.9 F (36.6 C)    SpO2: 91% 96%  98%  Weight:   79 kg   Height:   5\' 6"  (1.676 m)      Vitals:   01/02/21 1517 01/02/21 1518 01/02/21 1519 01/02/21 1622  BP:  (!) 171/106  (!) 149/94  Pulse:  77  96  Resp:  (!) 22  (!) 28  Temp:  97.9 F (36.6 C)    SpO2: 91% 96%  98%  Weight:   79 kg   Height:   5\' 6"  (1.676 m)       Constitutional: Alert and oriented x 3 . Not in any apparent distress HEENT:      Head: Normocephalic and atraumatic.         Eyes: PERLA, EOMI, Conjunctivae are normal. Sclera is non-icteric.       Mouth/Throat: Mucous membranes are moist.       Neck: Supple with no signs of meningismus. Cardiovascular:  Tachycardic. No murmurs, gallops, or rubs. 2+ symmetrical distal pulses are present . No JVD.  Trace LE edema Respiratory: Respiratory effort normal .crackles in both lung bases bilaterally. No wheezes or rhonchi.  Gastrointestinal: Soft, non tender, and non distended with positive bowel sounds.  Genitourinary: No CVA tenderness. Musculoskeletal: Nontender with normal range of motion in all extremities. No cyanosis, or erythema of extremities. Neurologic:  Face is symmetric. Moving all extremities. No gross focal neurologic deficits . Skin: Skin is warm, dry.  No rash or ulcers Psychiatric: Mood  and affect are normal   Labs on Admission: I have personally reviewed following labs and imaging studies  CBC: Recent Labs  Lab 01/02/21 1521  WBC 4.6  NEUTROABS 2.8  HGB 12.6  HCT 38.3  MCV 97.2  PLT 312   Basic Metabolic Panel: Recent Labs  Lab 01/02/21 1521  NA 138  K 3.6  CL 105  CO2 24  GLUCOSE 112*  BUN 16  CREATININE 0.77  CALCIUM 8.9   GFR: Estimated Creatinine Clearance: 62.5 mL/min (by C-G formula based on SCr of 0.77 mg/dL). Liver Function Tests: Recent Labs  Lab 01/02/21 1521  AST  16  ALT 7  ALKPHOS 71  BILITOT 1.0  PROT 7.6  ALBUMIN 3.1*   No results for input(s): LIPASE, AMYLASE in the last 168 hours. No results for input(s): AMMONIA in the last 168 hours. Coagulation Profile: No results for input(s): INR, PROTIME in the last 168 hours. Cardiac Enzymes: No results for input(s): CKTOTAL, CKMB, CKMBINDEX, TROPONINI in the last 168 hours. BNP (last 3 results) No results for input(s): PROBNP in the last 8760 hours. HbA1C: No results for input(s): HGBA1C in the last 72 hours. CBG: No results for input(s): GLUCAP in the last 168 hours. Lipid Profile: No results for input(s): CHOL, HDL, LDLCALC, TRIG, CHOLHDL, LDLDIRECT in the last 72 hours. Thyroid Function Tests: No results for input(s): TSH, T4TOTAL, FREET4, T3FREE, THYROIDAB in the last 72 hours. Anemia Panel: No results for input(s): VITAMINB12, FOLATE, FERRITIN, TIBC, IRON, RETICCTPCT in the last 72 hours. Urine analysis:    Component Value Date/Time   COLORURINE YELLOW (A) 11/12/2020 1733   APPEARANCEUR HAZY (A) 11/12/2020 1733   APPEARANCEUR Clear 01/16/2012 0248   LABSPEC 1.018 11/12/2020 1733   LABSPEC 1.009 01/16/2012 0248   PHURINE 6.0 11/12/2020 1733   GLUCOSEU NEGATIVE 11/12/2020 1733   GLUCOSEU Negative 01/16/2012 0248   HGBUR SMALL (A) 11/12/2020 1733   BILIRUBINUR NEGATIVE 11/12/2020 1733   BILIRUBINUR Negative 01/16/2012 0248   KETONESUR NEGATIVE 11/12/2020 1733    PROTEINUR NEGATIVE 11/12/2020 1733   NITRITE NEGATIVE 11/12/2020 1733   LEUKOCYTESUR TRACE (A) 11/12/2020 1733   LEUKOCYTESUR Negative 01/16/2012 0248    Radiological Exams on Admission: DG Chest Port 1 View  Result Date: 01/02/2021 CLINICAL DATA:  Short of breath, congestive heart failure EXAM: PORTABLE CHEST 1 VIEW COMPARISON:  10/02/2016 FINDINGS: Single frontal view of the chest demonstrates an enlarged cardiac silhouette. There is central vascular congestion, with bilateral interstitial prominence. Bibasilar airspace disease with right greater than left pleural effusions. No pneumothorax. No acute bony abnormalities. IMPRESSION: 1. Constellation of findings consistent with congestive heart failure. Electronically Signed   By: Sharlet Salina M.D.   On: 01/02/2021 15:52     Assessment/Plan Principal Problem:   Acute on chronic combined systolic and diastolic CHF, NYHA class 1 (HCC) Active Problems:   HTN (hypertension)   GERD (gastroesophageal reflux disease)     Acute on chronic combined systolic and diastolic dysfunction CHF Patient presents for evaluation of shortness of breath that started at rest associated with lower extremity swelling Blood pressure was significantly elevated with systolic blood pressure greater than Patient's last 2D echocardiogram from 02/22 showed an LVEF of 30 to 35% with grade 2 diastolic dysfunction We will place patient on Lasix 40 mg IV twice daily Optimize blood pressure control Continue metoprolol, Entresto and Aldactone    Hypertension Uncontrolled Continue metoprolol, Entresto, Aldactone and diltiazem Will uptitrate the doses of medications to optimize blood pressure control    DVT prophylaxis: Lovenox Code Status: full code Family Communication: Greater than 50% of time was spent discussing patient's condition and plan of care with her at the bedside.  All questions and concerns have been addressed.  She verbalizes understanding  and agrees with the plan. Disposition Plan: Back to previous home environment Consults called: None Status: At the time of admission, it appears that the appropriate admission status for this patient is inpatient. This is judged to be reasonable and necessary in order to provide the required intensity of service to ensure the patient's safety given the presenting symptoms, physical exam findings, and initial  radiographic and laboratory data in the context of their comorbid conditions. Patient requires inpatient status due to high intensity of service, high risk for further deterioration and high frequency of surveillance required.   Lucile Shutters MD [facility]     01/02/2021, 4:50 PM

## 2021-01-02 NOTE — ED Notes (Signed)
Pt transported off unit by medic.

## 2021-01-03 ENCOUNTER — Encounter: Payer: Self-pay | Admitting: Internal Medicine

## 2021-01-03 DIAGNOSIS — I10 Essential (primary) hypertension: Secondary | ICD-10-CM

## 2021-01-03 LAB — BASIC METABOLIC PANEL
Anion gap: 8 (ref 5–15)
BUN: 18 mg/dL (ref 8–23)
CO2: 26 mmol/L (ref 22–32)
Calcium: 8.4 mg/dL — ABNORMAL LOW (ref 8.9–10.3)
Chloride: 106 mmol/L (ref 98–111)
Creatinine, Ser: 0.78 mg/dL (ref 0.44–1.00)
GFR, Estimated: >60 mL/min (ref >60)
Glucose, Bld: 117 mg/dL — ABNORMAL HIGH (ref 70–99)
Potassium: 3.3 mmol/L — ABNORMAL LOW (ref 3.5–5.1)
Sodium: 140 mmol/L (ref 135–145)

## 2021-01-03 LAB — MAGNESIUM: Magnesium: 1.5 mg/dL — ABNORMAL LOW (ref 1.7–2.4)

## 2021-01-03 MED ORDER — HYDRALAZINE HCL 25 MG PO TABS
25.0000 mg | ORAL_TABLET | Freq: Three times a day (TID) | ORAL | Status: DC
  Administered 2021-01-03 – 2021-01-05 (×7): 25 mg via ORAL
  Filled 2021-01-03 (×8): qty 1

## 2021-01-03 MED ORDER — POTASSIUM CHLORIDE CRYS ER 20 MEQ PO TBCR
40.0000 meq | EXTENDED_RELEASE_TABLET | Freq: Once | ORAL | Status: AC
  Administered 2021-01-03: 40 meq via ORAL
  Filled 2021-01-03: qty 2

## 2021-01-03 MED ORDER — MAGNESIUM SULFATE 2 GM/50ML IV SOLN
2.0000 g | Freq: Once | INTRAVENOUS | Status: AC
  Administered 2021-01-03: 2 g via INTRAVENOUS
  Filled 2021-01-03: qty 50

## 2021-01-03 MED ORDER — ISOSORBIDE MONONITRATE ER 30 MG PO TB24
30.0000 mg | ORAL_TABLET | Freq: Every day | ORAL | Status: DC
  Administered 2021-01-03 – 2021-01-05 (×3): 30 mg via ORAL
  Filled 2021-01-03 (×3): qty 1

## 2021-01-03 NOTE — Consult Note (Signed)
   Heart Failure Nurse Navigator Note  HFrEF 30-35%, grade 2 diastolic dysfunction.  Comorbidities:  Rheumatoid arthritis Hypertension GERD  Medications:  Aspirin 81 mg daily Furosemide 40 mg IV every 12 Hydralazine 25 mg every 8 hours Isosorbide mononitrate 30 mg daily Metoprolol succinate 25 mg daily Potassium chloride 40 mEq x 1 Entresto 24/26 mg 1 tablet twice a day Spironolactone 12 and half milligrams daily  Labs:  Sodium 140, potassium 3.3, chloride 106, CO2 26, BUN 18, creatinine 0.78, BNP 1821, troponin 16 Weight is 78.6 kg BMI 27.9 Blood pressure 126/66  Assessment:  General-she is awake and alert sitting up in the chair at bedside just finishing lunch.  In no acute distress.  HEENT-pupils are equal, normocephalic.  Cardiac-heart tones of regular rate and rhythm no murmurs rubs or gallops appreciated.   Chest-breath sounds initially in the bases.  Abdomen-rounded soft non tender  Musculoskeletal-there is no lower extremity edema noted.  Psych-is pleasant and appropriate, makes good eye contact.  Neurologic-she is awake and alert speech is clear.   Initial meeting with patient.  She states that she does not weigh herself on a daily basis.    Also she feels she does not eat anything that is high in sodium, discussed processed, convenience foods.  She states that she eats a lot of salads.  Does not use ham or bacon.  She denies drinking more than 8 cups of liquid daily.  States she has two 8 ounce sodas daily.  Discussed the importance of weighing daily reporting 2 to 3 pound weight gain or 5 pounds within a week to physician.  Went through living with heart failure teaching booklet, discussed heavily foods that she should be using on a daily basis that are low in sodium.  Very rarely uses canned vegetables but she states when she does that she rinses a and then cook them in plain water.  She was giving an appointment in the heart failure clinic along  with information about the clinic and the zone magnet.  We will continue to follow.   Tresa Endo RN [facility]

## 2021-01-03 NOTE — ED Provider Notes (Signed)
[facility] Emergency Department Provider Note   ____________________________________________   Event Date/Time   First MD Initiated Contact with Patient 01/02/21 1620     (approximate)  I have reviewed the triage vital signs and the nursing notes.   HISTORY  Chief Complaint Shortness of Breath    HPI Tina Fields is a 75 y.o. female with the below stated past medical history as well as a new diagnosis of CHF who presents for worsening bilateral lower extremity edema and shortness of breath.  Patient states that the symptoms have been increasing over the past 3-4 days that she has had increased p.o. fluid intake.  Patient states that she was only told to take her prescribed Lasix if her weights are over a certain number and she states that she has not gained any weight to the point where she needs to take her Lasix.  Patient endorses dyspnea on exertion and was placed on 2 L nasal cannula by EMS prior to arrival.  Patient currently denies any vision changes, tinnitus, difficulty speaking, facial droop, sore throat, chest pain, abdominal pain, nausea/vomiting/diarrhea, dysuria, or weakness/numbness/paresthesias in any extremity         Past Medical History:  Diagnosis Date  . Acid reflux   . Asthma   . Collagen vascular disease (HCC)    RA  . Edema of both legs   . Hypertension   . RA (rheumatoid arthritis) [facility])     Patient Active Problem List   Diagnosis Date Noted  . Acute on chronic combined systolic and diastolic CHF (congestive heart failure) (HCC) 01/02/2021  . 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 HTN (hypertension) 08/21/2017  . GERD (gastroesophageal reflux disease) 08/21/2017    Past Surgical History:  Procedure Laterality Date  . TUBAL LIGATION      Prior to Admission medications   Medication Sig Start Date End Date Taking? Authorizing Provider  albuterol (VENTOLIN HFA) 108 (90 Base) MCG/ACT inhaler Inhale 1-2 puffs  into the lungs every 6 (six) hours as needed. 11/06/20  Yes [provider]  budesonide-formoterol (SYMBICORT) 80-4.5 MCG/ACT inhaler Inhale 2 puffs into the lungs 2 (two) times daily as needed. 12/03/18  Yes [provider]  [employer]S VITAMIN B12 1000 MCG tablet Take 1,000 mcg by mouth daily. 08/04/20  Yes [provider]  ENTRESTO 24-26 MG Take 1 tablet by mouth daily. 11/02/20  Yes [provider]  fluticasone (FLONASE) 50 MCG/ACT nasal spray Place 2 sprays into the nose daily. 12/03/18  Yes [provider]  furosemide (LASIX) 40 MG tablet Take as needed for weight gain 10/29/20  Yes [provider]  meloxicam (MOBIC) 15 MG tablet Take 15 mg by mouth daily. 11/10/20  Yes [provider]  metoprolol succinate (TOPROL-XL) 25 MG 24 hr tablet Take 25 mg by mouth daily.   Yes [provider]  spironolactone (ALDACTONE) 25 MG tablet Take 12.5 mg by mouth daily. 10/29/20  Yes [provider]  Vitamin D, Ergocalciferol, (DRISDOL) 1.25 MG (50000 UNIT) CAPS capsule Take 50,000 Units by mouth every 7 (seven) days. 09/11/20  Yes [provider]  meclizine (ANTIVERT) 25 MG tablet Take 1 tablet (25 mg total) by mouth 3 (three) times daily as needed for dizziness. Patient not taking: No sig reported 11/12/20   Dionne Bucy, MD  olmesartan (BENICAR) 40 MG tablet Take 40 mg by mouth daily. Patient not taking: No sig reported 09/02/20   [provider]  TIADYLT ER 360 MG 24  hr capsule Take 360 mg by mouth daily. Patient not taking: No sig reported 10/13/20   [provider]    Allergies Azithromycin and Latex  Family History  Problem Relation Age of Onset  . CAD Mother   . Diabetes Mother   . Hypertension Mother   . CAD Father   . Diabetes Father   . Hypertension Father   . Stroke Father     Social History Social History   Tobacco Use  . Smoking status: Former Smoker    Quit date: 01/09/1995    Years  since quitting: 26.0  . Smokeless tobacco: Never Used  Vaping Use  . Vaping Use: Never used  Substance Use Topics  . Alcohol use: No  . Drug use: No    Review of Systems Constitutional: No fever/chills Eyes: No visual changes. ENT: No sore throat. Cardiovascular: Denies chest pain. Respiratory: Endorses shortness of breath. Gastrointestinal: No abdominal pain.  No nausea, no vomiting.  No diarrhea. Genitourinary: Negative for dysuria. Musculoskeletal: Negative for acute arthralgias Skin: Negative for rash.  Bilateral lower extremity edema Neurological: Negative for headaches, weakness/numbness/paresthesias in any extremity Psychiatric: Negative for suicidal ideation/homicidal ideation   ____________________________________________   PHYSICAL EXAM:  VITAL SIGNS: ED Triage Vitals  Enc Vitals Group     BP 01/02/21 1518 (!) 171/106     Pulse Rate 01/02/21 1518 77     Resp 01/02/21 1518 (!) 22     Temp 01/02/21 1518 97.9 F (36.6 C)     Temp Source 01/02/21 1844 Oral     SpO2 01/02/21 1515 97 %     Weight 01/02/21 1519 174 lb 2.6 oz (79 kg)     Height 01/02/21 1519 5\' 6"  (1.676 m)     Head Circumference --      Peak Flow --      Pain Score 01/02/21 1518 0     Pain Loc --      Pain Edu? --      Excl. in GC? --    Constitutional: Alert and oriented. Well appearing and in no acute distress. Eyes: Conjunctivae are normal. PERRL. Head: Atraumatic. Nose: No congestion/rhinnorhea. Mouth/Throat: Mucous membranes are moist. Neck: No stridor Cardiovascular: Grossly normal heart sounds.  Good peripheral circulation. Respiratory: Normal respiratory effort.  No retractions.  2 L nasal cannula in place with rales over bilateral lower lung fields Gastrointestinal: Soft and nontender. No distention. Musculoskeletal: No obvious deformities Neurologic:  Normal speech and language. No gross focal neurologic deficits are appreciated. Skin:  Skin is warm and dry. No rash noted.   Bilateral lower extremity edema Psychiatric: Mood and affect are normal. Speech and behavior are normal.  ____________________________________________   LABS (all labs ordered are listed, but only abnormal results are displayed)  Labs Reviewed  COMPREHENSIVE METABOLIC PANEL - Abnormal; Notable for the following components:      Result Value   Glucose, Bld 112 (*)    Albumin 3.1 (*)    All other components within normal limits  BRAIN NATRIURETIC PEPTIDE - Abnormal; Notable for the following components:   B Natriuretic Peptide 1,821.9 (*)    All other components within normal limits  BASIC METABOLIC PANEL - Abnormal; Notable for the following components:   Potassium 3.3 (*)    Glucose, Bld 117 (*)    Calcium 8.4 (*)    All other components within normal limits  MAGNESIUM - Abnormal; Notable for the following components:   Magnesium 1.5 (*)    All  other components within normal limits  RESP PANEL BY RT-PCR (FLU A&B, COVID) ARPGX2  CBC WITH DIFFERENTIAL/PLATELET  BASIC METABOLIC PANEL  TROPONIN I (HIGH SENSITIVITY)  TROPONIN I (HIGH SENSITIVITY)   ____________________________________________  EKG  ED ECG REPORT I, Merwyn Katos, the attending physician, personally viewed and interpreted this ECG.  Date: 01/03/2021 EKG Time: 2129 Rate: 92 Rhythm: normal sinus rhythm QRS Axis: normal Intervals: normal ST/T Wave abnormalities: normal Narrative Interpretation: no evidence of acute ischemia  ____________________________________________  RADIOLOGY  ED MD interpretation: Single view portable chest x-ray shows constellation of findings consistent with congestive heart failure including central vascular congestion with bilateral interstitial prominence  Official radiology report(s): No results found.  ____________________________________________   PROCEDURES  Procedure(s) performed (including Critical Care):  .1-3 Lead EKG Interpretation Performed by: Merwyn Katos, MD Authorized by: Merwyn Katos, MD     Interpretation: normal     ECG rate:  86   ECG rate assessment: normal     Rhythm: sinus rhythm     Ectopy: none     Conduction: normal   .Critical Care Performed by: Merwyn Katos, MD Authorized by: Merwyn Katos, MD   Critical care provider statement:    Critical care time (minutes):  33   Critical care time was exclusive of:  Separately billable procedures and treating other patients   Critical care was necessary to treat or prevent imminent or life-threatening deterioration of the following conditions:  Respiratory failure   Critical care was time spent personally by me on the following activities:  Discussions with consultants, evaluation of patient's response to treatment, examination of patient, ordering and performing treatments and interventions, ordering and review of laboratory studies, ordering and review of radiographic studies, pulse oximetry, re-evaluation of patient's condition, obtaining history from patient or surrogate and review of old charts   I assumed direction of critical care for this patient from another provider in my specialty: no     Care discussed with: admitting provider       ____________________________________________   INITIAL IMPRESSION / ASSESSMENT AND PLAN / ED COURSE  As part of my medical decision making, I reviewed the following data within the electronic MEDICAL RECORD NUMBER Nursing notes reviewed and incorporated, Labs reviewed, EKG interpreted, Old chart reviewed, Radiograph reviewed and Notes from prior ED visits reviewed and incorporated        + dyspnea +LE edema + Non adherence to medication regimen  Workup: ECG, CBC, BMP, Troponin, BNP, CXR Findings: EKG: No STEMI and no evidence of Brugadas sign, delta wave, epsilon wave, significantly prolonged QTc, or malignant arrhythmia. BNP: 1822 CXR: Constellation of findings consistent with CHF Based on history, exam and findings,  presentation most consistent with acute on chronic heart failure. Low suspicion for PNA, ACS, tamponade, aortic dissection. Interventions: Oxygen, Diuresis  Reassessment: Symptoms improved in ED with oxygen and diuresis  Disposition (Stable but not significantly improved): Admit to medicine for further monitoring and for improvement of medication regimen to control symptoms.}       ____________________________________________   FINAL CLINICAL IMPRESSION(S) / ED DIAGNOSES  Final diagnoses:  None     ED Discharge Orders    None       Note:  This document was prepared using Dragon voice recognition software and may include unintentional dictation errors.   Merwyn Katos, MD 01/03/21 [phone]

## 2021-01-03 NOTE — Progress Notes (Addendum)
Progress Note    Tina Fields  CNO:[id number] DOB: [birth year]  DOA: 01/02/2021 PCP: Oswaldo Conroy, MD      Brief Narrative:    Medical records reviewed and are as summarized below:  Tina Fields is a 75 y.o. female with medical history significant for rheumatoid arthritis, hypertension, CAD, chronic combined systolic and diastolic CHF below presented to the hospital because of shortness of breath and bilateral leg edema.  She said she had been drinking a lot of sodas lately.  Reportedly when EMS picked from her house, her oxygen saturation was 90% and she was placed on 4 L/min oxygen.  She was admitted to the hospital for acute exacerbation of chronic systolic and diastolic CHF.  She was treated with IV Lasix.    Assessment/Plan:   Principal Problem:   Acute on chronic combined systolic and diastolic CHF (congestive heart failure) (HCC) Active Problems:   HTN (hypertension)   GERD (gastroesophageal reflux disease)   Body mass index is 27.97 kg/m.    Acute exacerbation of chronic systolic and diastolic CHF: Continue IV Lasix and antihypertensives.  Substitute hydralazine and isosorbide mononitrate for Cardizem.  Monitor BMP, daily weight and urine output.  2D echo done in February 2022 showed EF estimated at 30 to 35% and grade 2 diastolic dysfunction.   Hypokalemia: Replete potassium and monitor levels.  Check magnesium level.  Hypertension: Continue antihypertensives  No documented hypoxia: Discontinue oxygen as able.  Diet Order            Diet 2 gram sodium Room service appropriate? Yes; Fluid consistency: Thin  Diet effective now                    Consultants:  None   Procedures:  None    Medications:   . aspirin EC  81 mg Oral Daily  . enoxaparin (LOVENOX) injection  40 mg Subcutaneous Q24H  . furosemide  40 mg Intravenous Q12H  . hydrALAZINE  25 mg Oral Q8H  . isosorbide mononitrate  30 mg Oral Daily  .  metoprolol succinate  25 mg Oral Daily  . sacubitril-valsartan  1 tablet Oral Daily  . sodium chloride flush  3 mL Intravenous Q12H  . spironolactone  12.5 mg Oral Daily  . cyanocobalamin  1,000 mcg Oral Daily  . Vitamin D (Ergocalciferol)  50,000 Units Oral Q7 days   Continuous Infusions: . sodium chloride       Anti-infectives (From admission, onward)   None             Family Communication/Anticipated D/C date and plan/Code Status   DVT prophylaxis: enoxaparin (LOVENOX) injection 40 mg Start: 01/02/21 1700     Code Status: Full Code  Family Communication: None Disposition Plan:    Status is: Inpatient  Remains inpatient appropriate because:IV treatments appropriate due to intensity of illness or inability to take PO and Inpatient level of care appropriate due to severity of illness   Dispo: The patient is from: Home              Anticipated d/c is to: Home              Patient currently is not medically stable to d/c.   Difficult to place patient No           Subjective:   C/o shortness of breath and generalized weakness.  Objective:    Vitals:   01/02/21 1844 01/02/21 1939 01/03/21 0528 01/03/21  0720  BP: (!) 160/99 (!) 142/88 (!) 141/98 (!) 151/96  Pulse: 96 90 92 98  Resp: 20 20 20 20   Temp: 97.7 F (36.5 C) 97.9 F (36.6 C) 97.9 F (36.6 C) 97.6 F (36.4 C)  TempSrc: Oral   Oral  SpO2: 98% 99% 98% 100%  Weight: 78.3 kg  78.6 kg   Height: 5\' 6"  (1.676 m)      No data found.   Intake/Output Summary (Last 24 hours) at 01/03/2021 0853 Last data filed at 01/03/2021 0603 Gross per 24 hour  Intake --  Output 950 ml  Net -950 ml   Filed Weights   01/02/21 1519 01/02/21 1844 01/03/21 0528  Weight: 79 kg 78.3 kg 78.6 kg    Exam:  GEN: NAD SKIN: Warm and dry EYES: No pallor or icterus ENT: MMM CV: RRR PULM: Bibasilar rales.  No wheezing ABD: soft, ND, NT, +BS CNS: AAO x 3, non focal EXT: Trace edema, no tenderness         Data Reviewed:   I have personally reviewed following labs and imaging studies:  Labs: Labs show the following:   Basic Metabolic Panel: Recent Labs  Lab 01/02/21 1521 01/03/21 0502  NA 138 140  K 3.6 3.3*  CL 105 106  CO2 24 26  GLUCOSE 112* 117*  BUN 16 18  CREATININE 0.77 0.78  CALCIUM 8.9 8.4*   GFR Estimated Creatinine Clearance: 62.3 mL/min (by C-G formula based on SCr of 0.78 mg/dL). Liver Function Tests: Recent Labs  Lab 01/02/21 1521  AST 16  ALT 7  ALKPHOS 71  BILITOT 1.0  PROT 7.6  ALBUMIN 3.1*   No results for input(s): LIPASE, AMYLASE in the last 168 hours. No results for input(s): AMMONIA in the last 168 hours. Coagulation profile No results for input(s): INR, PROTIME in the last 168 hours.  CBC: Recent Labs  Lab 01/02/21 1521  WBC 4.6  NEUTROABS 2.8  HGB 12.6  HCT 38.3  MCV 97.2  PLT 312   Cardiac Enzymes: No results for input(s): CKTOTAL, CKMB, CKMBINDEX, TROPONINI in the last 168 hours. BNP (last 3 results) No results for input(s): PROBNP in the last 8760 hours. CBG: No results for input(s): GLUCAP in the last 168 hours. D-Dimer: No results for input(s): DDIMER in the last 72 hours. Hgb A1c: No results for input(s): HGBA1C in the last 72 hours. Lipid Profile: No results for input(s): CHOL, HDL, LDLCALC, TRIG, CHOLHDL, LDLDIRECT in the last 72 hours. Thyroid function studies: No results for input(s): TSH, T4TOTAL, T3FREE, THYROIDAB in the last 72 hours.  Invalid input(s): FREET3 Anemia work up: No results for input(s): VITAMINB12, FOLATE, FERRITIN, TIBC, IRON, RETICCTPCT in the last 72 hours. Sepsis Labs: Recent Labs  Lab 01/02/21 1521  WBC 4.6    Microbiology Recent Results (from the past 240 hour(s))  Resp Panel by RT-PCR (Flu A&B, Covid) Nasopharyngeal Swab     Status: None   Collection Time: 01/02/21  4:20 PM   Specimen: Nasopharyngeal Swab; Nasopharyngeal(NP) swabs in vial transport medium  Result Value Ref  Range Status   SARS Coronavirus 2 by RT PCR NEGATIVE NEGATIVE Final    Comment: (NOTE) SARS-CoV-2 target nucleic acids are NOT DETECTED.  The SARS-CoV-2 RNA is generally detectable in upper respiratory specimens during the acute phase of infection. The lowest concentration of SARS-CoV-2 viral copies this assay can detect is 138 copies/mL. A negative result does not preclude SARS-Cov-2 infection and should not be used as the sole  basis for treatment or other patient management decisions. A negative result may occur with  improper specimen collection/handling, submission of specimen other than nasopharyngeal swab, presence of viral mutation(s) within the areas targeted by this assay, and inadequate number of viral copies(<138 copies/mL). A negative result must be combined with clinical observations, patient history, and epidemiological information. The expected result is Negative.  Fact Sheet for Patients:  BloggerCourse.com  Fact Sheet for Healthcare Providers:  SeriousBroker.it  This test is no t yet approved or cleared by the [place] FDA and  has been authorized for detection and/or diagnosis of SARS-CoV-2 by FDA under an Emergency Use Authorization (EUA). This EUA will remain  in effect (meaning this test can be used) for the duration of the COVID-19 declaration under Section 564(b)(1) of the Act, 21 U.S.C.section 360bbb-3(b)(1), unless the authorization is terminated  or revoked sooner.       Influenza A by PCR NEGATIVE NEGATIVE Final   Influenza B by PCR NEGATIVE NEGATIVE Final    Comment: (NOTE) The Xpert Xpress SARS-CoV-2/FLU/RSV plus assay is intended as an aid in the diagnosis of influenza from Nasopharyngeal swab specimens and should not be used as a sole basis for treatment. Nasal washings and aspirates are unacceptable for Xpert Xpress SARS-CoV-2/FLU/RSV testing.  Fact Sheet for  Patients: BloggerCourse.com  Fact Sheet for Healthcare Providers: SeriousBroker.it  This test is not yet approved or cleared by the [place] FDA and has been authorized for detection and/or diagnosis of SARS-CoV-2 by FDA under an Emergency Use Authorization (EUA). This EUA will remain in effect (meaning this test can be used) for the duration of the COVID-19 declaration under Section 564(b)(1) of the Act, 21 U.S.C. section 360bbb-3(b)(1), unless the authorization is terminated or revoked.  Performed at [facility], [address] [place], [place] [address]     Procedures and diagnostic studies:  DG Chest [facility] 1 View  Result Date: 01/02/2021 CLINICAL DATA:  Short of breath, congestive heart failure EXAM: PORTABLE CHEST 1 VIEW COMPARISON:  10/02/2016 FINDINGS: Single frontal view of the chest demonstrates an enlarged cardiac silhouette. There is central vascular congestion, with bilateral interstitial prominence. Bibasilar airspace disease with right greater than left pleural effusions. No pneumothorax. No acute bony abnormalities. IMPRESSION: 1. Constellation of findings consistent with congestive heart failure. Electronically Signed   By: Sharlet Salina M.D.   On: 01/02/2021 15:52               LOS: 1 day   Clotilde Loth  [facility]   Pager on [email or web address]. If 7PM-7AM, please contact night-coverage at [email or web address]     01/03/2021, 8:53 AM

## 2021-01-04 LAB — BASIC METABOLIC PANEL
Anion gap: 12 (ref 5–15)
BUN: 20 mg/dL (ref 8–23)
CO2: 24 mmol/L (ref 22–32)
Calcium: 8.7 mg/dL — ABNORMAL LOW (ref 8.9–10.3)
Chloride: 101 mmol/L (ref 98–111)
Creatinine, Ser: 0.77 mg/dL (ref 0.44–1.00)
GFR, Estimated: >60 mL/min (ref >60)
Glucose, Bld: 106 mg/dL — ABNORMAL HIGH (ref 70–99)
Potassium: 3.5 mmol/L (ref 3.5–5.1)
Sodium: 137 mmol/L (ref 135–145)

## 2021-01-04 LAB — MAGNESIUM: Magnesium: 1.9 mg/dL (ref 1.7–2.4)

## 2021-01-04 MED ORDER — POTASSIUM CHLORIDE CRYS ER 20 MEQ PO TBCR
40.0000 meq | EXTENDED_RELEASE_TABLET | Freq: Once | ORAL | Status: AC
  Administered 2021-01-04: 40 meq via ORAL
  Filled 2021-01-04: qty 2

## 2021-01-04 NOTE — Progress Notes (Addendum)
   Heart Failure Nurse Navigator Note   HFrEF 30-35%    Met with patient today and by teach back method discussed how she is going to care for self when she goes home.  She  lists of daily weight,  calling if she gained weight or if she knows creasing shortness of breath or swelling.  Following low sodium diet and sticking with fluid restriction.  States that she feels her breathing is back to normal, she is lying supine in bed with no distress noted.   Tina Riffle RN [facility]

## 2021-01-04 NOTE — Evaluation (Signed)
Physical Therapy Evaluation Patient Details Name: Rane Dumm MRN: [id number] DOB: Dec 23, 1945 Today's Date: 01/04/2021   History of Present Illness  presented to ER secondary to progressive SOB and LE edema; admitted for management of acute/chronic systolic and dialostic CHF.  Clinical Impression  Patient resting in bed upon arrival to session; alert and oriented, follows commands and agreeable to participation with session.  Denies pain.  Bilat UE/LE strength and ROM grossly symmetrical and WFL; no focal weakness appreciated.  Able to complete bed mobility with sup; sit/stand, basic transfers and gait (100') without assist device, min assist.  Demonstrates reciprocal stepping pattern, slightly guarded and intermittently reaching for furniture/rails for external stabilization.  Limited balance reactions evident; limited activity tolerance noted.  Mod SOB with gait efforts, sats >90% on RA; BORG 5/10 after distance.  Additional gait trial (60' with RW, sup) completed with sup; noted improved stability and safety with gait efforts; decreased energy expenditure noted with use of assist device.  Do recommend continued use of assist device for all mobility tasks at this time.  Patient voiced agreement/understanding. Would benefit from skilled PT to address above deficits and promote optimal return to PLOF.; Recommend transition to HHPT upon discharge from acute hospitalization.     Follow Up Recommendations Home health PT    Equipment Recommendations  Rolling walker with 5" wheels    Recommendations for Other Services       Precautions / Restrictions Precautions Precautions: Fall Restrictions Weight Bearing Restrictions: No      Mobility  Bed Mobility Overal bed mobility: Needs Assistance Bed Mobility: Supine to Sit     Supine to sit: Supervision          Transfers Overall transfer level: Needs assistance   Transfers: Sit to/from Stand Sit to Stand: Min assist          General transfer comment: assist for lift off and initial stabilization  Ambulation/Gait Ambulation/Gait assistance: Min assist Gait Distance (Feet): 100 Feet Assistive device: None       General Gait Details: reciprocal stepping pattern, slightly guarded and intermittently reaching for furniture/rails for external stabilization.  Limited balance reactions evident; limited activity tolerance noted.  Mod SOB with gait efforts, sats >90% on RA; BORG 5/10 after distance  Stairs            Wheelchair Mobility    Modified Rankin (Stroke Patients Only)       Balance Overall balance assessment: Needs assistance Sitting-balance support: No upper extremity supported;Feet supported Sitting balance-Leahy Scale: Good     Standing balance support: Bilateral upper extremity supported Standing balance-Leahy Scale: Fair                               Pertinent Vitals/Pain Pain Assessment: No/denies pain    Home Living Family/patient expects to be discharged to:: Private residence Living Arrangements: Children Available Help at Discharge: Family;Available 24 hours/day Type of Home: House Home Access: Stairs to enter Entrance Stairs-Rails: None ("I hold onto my son") Entrance Stairs-Number of Steps: 3 Home Layout: Two level;Able to live on main level with bedroom/bathroom Home Equipment: None      Prior Function Level of Independence: Independent         Comments: Indep with ADLs, household mobilization; endorses very limited mobility outside of the home.  Does endorse 4-5 falls in previous six months.  No home O2.     Hand Dominance  Extremity/Trunk Assessment   Upper Extremity Assessment Upper Extremity Assessment: Overall WFL for tasks assessed    Lower Extremity Assessment Lower Extremity Assessment: Generalized weakness (grossly 4- to 4/5 throughout)       Communication   Communication: No difficulties  Cognition Arousal/Alertness:  Awake/alert Behavior During Therapy: WFL for tasks assessed/performed Overall Cognitive Status: Within Functional Limits for tasks assessed                                        General Comments      Exercises Other Exercises Other Exercises: 29' with RW, cga-improved stability and safety with gait efforts; decreased energy expenditure noted with use of assist device Other Exercises: Toilet transfer, ambulatory with RW, min assist; sit/stand from standard height toilet, mod assist with grab bar (increased assist due to lower surface height)   Assessment/Plan    PT Assessment Patient needs continued PT services  PT Problem List Decreased strength;Decreased activity tolerance;Decreased balance;Decreased mobility;Decreased knowledge of use of DME;Decreased safety awareness;Decreased knowledge of precautions       PT Treatment Interventions DME instruction;Gait training;Functional mobility training;Therapeutic activities;Patient/family education;Balance training;Therapeutic exercise    PT Goals (Current goals can be found in the Care Plan section)  Acute Rehab PT Goals Patient Stated Goal: to return home PT Goal Formulation: With patient Time For Goal Achievement: 01/18/21 Potential to Achieve Goals: Good    Frequency Min 2X/week   Barriers to discharge        Co-evaluation               AM-PAC PT "6 Clicks" Mobility  Outcome Measure Help needed turning from your back to your side while in a flat bed without using bedrails?: None Help needed moving from lying on your back to sitting on the side of a flat bed without using bedrails?: None Help needed moving to and from a bed to a chair (including a wheelchair)?: A Little Help needed standing up from a chair using your arms (e.g., wheelchair or bedside chair)?: A Little Help needed to walk in hospital room?: A Little Help needed climbing 3-5 steps with a railing? : A Little 6 Click Score: 20    End of  Session Equipment Utilized During Treatment: Gait belt Activity Tolerance: Patient tolerated treatment well Patient left: in chair;with call bell/phone within reach Nurse Communication: Mobility status PT Visit Diagnosis: Difficulty in walking, not elsewhere classified (R26.2);Muscle weakness (generalized) (M62.81)    Time: 2947-6546 PT Time Calculation (min) (ACUTE ONLY): 23 min   Charges:   PT Evaluation $PT Eval Moderate Complexity: 1 Mod PT Treatments $Gait Training: 8-22 mins      Chelsa Stout H. Manson Passey, PT, DPT, NCS 01/04/21, 10:45 PM [phone]

## 2021-01-04 NOTE — Progress Notes (Signed)
Progress Note    Tina Fields  OBS:[id number] DOB: 1945/11/30  DOA: 01/02/2021 PCP: Oswaldo Conroy, MD      Brief Narrative:    Medical records reviewed and are as summarized below:  Tina Fields is a 75 y.o. female with medical history significant for rheumatoid arthritis, hypertension, CAD, chronic combined systolic and diastolic CHF below presented to the hospital because of shortness of breath and bilateral leg edema.  She said she had been drinking a lot of sodas lately.  Reportedly when EMS picked from her house, her oxygen saturation was 90% and she was placed on 4 L/min oxygen.  She was admitted to the hospital for acute exacerbation of chronic systolic and diastolic CHF.  She was treated with IV Lasix.    Assessment/Plan:   Principal Problem:   Acute on chronic combined systolic and diastolic CHF (congestive heart failure) (HCC) Active Problems:   HTN (hypertension)   GERD (gastroesophageal reflux disease)   Body mass index is 27.97 kg/m.    Acute exacerbation of chronic systolic and diastolic CHF: Continue IV Lasix and antihypertensives.  Hydralazine and isosorbide mononitrate have been substituted for Cardizem.  Pulse oximetry with ambulation was ordered this morning.  However according to his nurse, she is unsteady on her feet so this cannot be done until PT evaluation.   Monitor BMP, daily weight and urine output.  2D echo done in February 2022 showed EF estimated at 30 to 35% and grade 2 diastolic dysfunction.   Hypokalemia: Improved.  Continue potassium repletion  Hypomagnesemia: Improved  Hypertension: Continue antihypertensives  Unsteady gait/generalized weakness: PT evaluation is pending.   Diet Order            Diet 2 gram sodium Room service appropriate? Yes; Fluid consistency: Thin  Diet effective now                    Consultants:  None   Procedures:  None    Medications:   . aspirin EC  81 mg Oral  Daily  . enoxaparin (LOVENOX) injection  40 mg Subcutaneous Q24H  . furosemide  40 mg Intravenous Q12H  . hydrALAZINE  25 mg Oral Q8H  . isosorbide mononitrate  30 mg Oral Daily  . metoprolol succinate  25 mg Oral Daily  . sacubitril-valsartan  1 tablet Oral Daily  . sodium chloride flush  3 mL Intravenous Q12H  . spironolactone  12.5 mg Oral Daily  . cyanocobalamin  1,000 mcg Oral Daily  . Vitamin D (Ergocalciferol)  50,000 Units Oral Q7 days   Continuous Infusions: . sodium chloride       Anti-infectives (From admission, onward)   None             Family Communication/Anticipated D/C date and plan/Code Status   DVT prophylaxis: enoxaparin (LOVENOX) injection 40 mg Start: 01/02/21 1700     Code Status: Full Code  Family Communication: None Disposition Plan:    Status is: Inpatient  Remains inpatient appropriate because:IV treatments appropriate due to intensity of illness or inability to take PO and Inpatient level of care appropriate due to severity of illness   Dispo: The patient is from: Home              Anticipated d/c is to: Home              Patient currently is not medically stable to d/c.   Difficult to place patient No  Subjective:   C/o generalized weakness.  Breathing is a little better.  Objective:    Vitals:   01/04/21 0925 01/04/21 1147 01/04/21 1533 01/04/21 1559  BP: 139/68 123/80  133/74  Pulse: (!) 108 78  (!) 105  Resp: 17 17  18   Temp: 97.8 F (36.6 C) 97.8 F (36.6 C)  97.8 F (36.6 C)  TempSrc:      SpO2: 95% 91% 95% 95%  Weight:      Height:       No data found.   Intake/Output Summary (Last 24 hours) at 01/04/2021 1625 Last data filed at 01/04/2021 1606 Gross per 24 hour  Intake 420 ml  Output 1100 ml  Net -680 ml   Filed Weights   01/02/21 1844 01/03/21 0528 01/04/21 0516  Weight: 78.3 kg 78.6 kg 78.6 kg    Exam:  GEN: NAD SKIN: No rash EYES: EOMI ENT: MMM CV: RRR PULM: Bibasilar  rales.  No wheezing heard. ABD: soft, ND, NT, +BS CNS: AAO x 3, non focal EXT: No edema or tenderness       Data Reviewed:   I have personally reviewed following labs and imaging studies:  Labs: Labs show the following:   Basic Metabolic Panel: Recent Labs  Lab 01/02/21 1521 01/03/21 0502 01/04/21 0540  NA 138 140 137  K 3.6 3.3* 3.5  CL 105 106 101  CO2 24 26 24   GLUCOSE 112* 117* 106*  BUN 16 18 20   CREATININE 0.77 0.78 0.77  CALCIUM 8.9 8.4* 8.7*  MG  --  1.5* 1.9   GFR Estimated Creatinine Clearance: 62.3 mL/min (by C-G formula based on SCr of 0.77 mg/dL). Liver Function Tests: Recent Labs  Lab 01/02/21 1521  AST 16  ALT 7  ALKPHOS 71  BILITOT 1.0  PROT 7.6  ALBUMIN 3.1*   No results for input(s): LIPASE, AMYLASE in the last 168 hours. No results for input(s): AMMONIA in the last 168 hours. Coagulation profile No results for input(s): INR, PROTIME in the last 168 hours.  CBC: Recent Labs  Lab 01/02/21 1521  WBC 4.6  NEUTROABS 2.8  HGB 12.6  HCT 38.3  MCV 97.2  PLT 312   Cardiac Enzymes: No results for input(s): CKTOTAL, CKMB, CKMBINDEX, TROPONINI in the last 168 hours. BNP (last 3 results) No results for input(s): PROBNP in the last 8760 hours. CBG: No results for input(s): GLUCAP in the last 168 hours. D-Dimer: No results for input(s): DDIMER in the last 72 hours. Hgb A1c: No results for input(s): HGBA1C in the last 72 hours. Lipid Profile: No results for input(s): CHOL, HDL, LDLCALC, TRIG, CHOLHDL, LDLDIRECT in the last 72 hours. Thyroid function studies: No results for input(s): TSH, T4TOTAL, T3FREE, THYROIDAB in the last 72 hours.  Invalid input(s): FREET3 Anemia work up: No results for input(s): VITAMINB12, FOLATE, FERRITIN, TIBC, IRON, RETICCTPCT in the last 72 hours. Sepsis Labs: Recent Labs  Lab 01/02/21 1521  WBC 4.6    Microbiology Recent Results (from the past 240 hour(s))  Resp Panel by RT-PCR (Flu A&B, Covid)  Nasopharyngeal Swab     Status: None   Collection Time: 01/02/21  4:20 PM   Specimen: Nasopharyngeal Swab; Nasopharyngeal(NP) swabs in vial transport medium  Result Value Ref Range Status   SARS Coronavirus 2 by RT PCR NEGATIVE NEGATIVE Final    Comment: (NOTE) SARS-CoV-2 target nucleic acids are NOT DETECTED.  The SARS-CoV-2 RNA is generally detectable in upper respiratory specimens during the acute phase of  infection. The lowest concentration of SARS-CoV-2 viral copies this assay can detect is 138 copies/mL. A negative result does not preclude SARS-Cov-2 infection and should not be used as the sole basis for treatment or other patient management decisions. A negative result may occur with  improper specimen collection/handling, submission of specimen other than nasopharyngeal swab, presence of viral mutation(s) within the areas targeted by this assay, and inadequate number of viral copies(<138 copies/mL). A negative result must be combined with clinical observations, patient history, and epidemiological information. The expected result is Negative.  Fact Sheet for Patients:  BloggerCourse.com  Fact Sheet for Healthcare Providers:  SeriousBroker.it  This test is no t yet approved or cleared by the [place] FDA and  has been authorized for detection and/or diagnosis of SARS-CoV-2 by FDA under an Emergency Use Authorization (EUA). This EUA will remain  in effect (meaning this test can be used) for the duration of the COVID-19 declaration under Section 564(b)(1) of the Act, 21 U.S.C.section 360bbb-3(b)(1), unless the authorization is terminated  or revoked sooner.       Influenza A by PCR NEGATIVE NEGATIVE Final   Influenza B by PCR NEGATIVE NEGATIVE Final    Comment: (NOTE) The Xpert Xpress SARS-CoV-2/FLU/RSV plus assay is intended as an aid in the diagnosis of influenza from Nasopharyngeal swab specimens and should not be  used as a sole basis for treatment. Nasal washings and aspirates are unacceptable for Xpert Xpress SARS-CoV-2/FLU/RSV testing.  Fact Sheet for Patients: BloggerCourse.com  Fact Sheet for Healthcare Providers: SeriousBroker.it  This test is not yet approved or cleared by the [place] FDA and has been authorized for detection and/or diagnosis of SARS-CoV-2 by FDA under an Emergency Use Authorization (EUA). This EUA will remain in effect (meaning this test can be used) for the duration of the COVID-19 declaration under Section 564(b)(1) of the Act, 21 U.S.C. section 360bbb-3(b)(1), unless the authorization is terminated or revoked.  Performed at [facility], [address] [place], [place] [address]     Procedures and diagnostic studies:  No results found.             LOS: 2 days   Lorissa Kishbaugh  [facility]   Pager on [email or web address]. If 7PM-7AM, please contact night-coverage at [email or web address]     01/04/2021, 4:25 PM

## 2021-01-05 DIAGNOSIS — J9601 Acute respiratory failure with hypoxia: Secondary | ICD-10-CM

## 2021-01-05 DIAGNOSIS — J810 Acute pulmonary edema: Secondary | ICD-10-CM

## 2021-01-05 DIAGNOSIS — I509 Heart failure, unspecified: Secondary | ICD-10-CM

## 2021-01-05 LAB — CBC
HCT: 38.1 % (ref 36.0–46.0)
Hemoglobin: 12.6 g/dL (ref 12.0–15.0)
MCH: 31.7 pg (ref 26.0–34.0)
MCHC: 33.1 g/dL (ref 30.0–36.0)
MCV: 95.7 fL (ref 80.0–100.0)
Platelets: 329 10*3/uL (ref 150–400)
RBC: 3.98 MIL/uL (ref 3.87–5.11)
RDW: 14.2 % (ref 11.5–15.5)
WBC: 5.5 10*3/uL (ref 4.0–10.5)
nRBC: 0 % (ref 0.0–0.2)

## 2021-01-05 LAB — MAGNESIUM: Magnesium: 1.8 mg/dL (ref 1.7–2.4)

## 2021-01-05 LAB — BASIC METABOLIC PANEL
Anion gap: 13 (ref 5–15)
BUN: 24 mg/dL — ABNORMAL HIGH (ref 8–23)
CO2: 23 mmol/L (ref 22–32)
Calcium: 8.9 mg/dL (ref 8.9–10.3)
Chloride: 101 mmol/L (ref 98–111)
Creatinine, Ser: 0.93 mg/dL (ref 0.44–1.00)
GFR, Estimated: >60 mL/min (ref >60)
Glucose, Bld: 98 mg/dL (ref 70–99)
Potassium: 3.8 mmol/L (ref 3.5–5.1)
Sodium: 137 mmol/L (ref 135–145)

## 2021-01-05 MED ORDER — FUROSEMIDE 40 MG PO TABS: 40.0000 mg | ORAL_TABLET | Freq: Every day | ORAL | 3 refills | Status: DC

## 2021-01-05 MED ORDER — FUROSEMIDE 40 MG PO TABS: 40.0000 mg | ORAL_TABLET | Freq: Every day | ORAL | Status: DC

## 2021-01-05 MED ORDER — ISOSORBIDE MONONITRATE ER 30 MG PO TB24: 30.0000 mg | ORAL_TABLET | Freq: Every day | ORAL | 3 refills | Status: DC

## 2021-01-05 MED ORDER — FUROSEMIDE 10 MG/ML IJ SOLN
40.0000 mg | Freq: Two times a day (BID) | INTRAMUSCULAR | Status: AC
  Administered 2021-01-05: 40 mg via INTRAVENOUS
  Filled 2021-01-05: qty 4

## 2021-01-05 MED ORDER — ASPIRIN 81 MG PO TBEC: 81.0000 mg | DELAYED_RELEASE_TABLET | Freq: Every day | ORAL | 11 refills | Status: DC

## 2021-01-05 MED ORDER — HYDRALAZINE HCL 25 MG PO TABS: 25.0000 mg | ORAL_TABLET | Freq: Three times a day (TID) | ORAL | 3 refills | Status: DC

## 2021-01-05 NOTE — TOC Transition Note (Signed)
Transition of Care [facility]) - CM/SW Discharge Note   Patient Details  Name: Tina Fields MRN: [id number] Date of Birth: [birth year]  [facility] of [facility]) CM/SW Contact:  Maree Krabbe, LCSW Phone Number: 01/05/2021, 12:36 PM   Clinical Narrative:   HH arranged through [facility]. [employer] will deliver walker to room. No additional needs.    Final next level of care: Home w Home Health Services Barriers to Discharge: No Barriers Identified   Patient Goals and CMS Choice Patient states their goals for this hospitalization and ongoing recovery are:: to go home   Choice offered to / list presented to : Patient  Discharge Placement                    Patient and family notified of of transfer: 01/05/21  Discharge Plan and Services In-house Referral: Clinical Social Work   Post Acute Care Choice: Home Health          DME Arranged: Dan Humphreys DME Agency: [facility] Date DME Agency Contacted: 01/05/21 Time DME Agency Contacted: [phone] Representative spoke with at DME Agency: patricia HH Arranged: PT HH Agency: [facility] Date HH Agency Contacted: 01/05/21 Time HH Agency Contacted: 1233 Representative spoke with at [facility] Agency: brittany  Social Determinants of Health (SDOH) Interventions     Readmission Risk Interventions No flowsheet data found.

## 2021-01-05 NOTE — Care Management Important Message (Signed)
Important Message  Patient Details  Name: Tina Fields MRN: [id number] Date of Birth: Mar 03, 1946   Medicare Important Message Given:  Yes     Johnell Comings 01/05/2021, 11:01 AM

## 2021-01-05 NOTE — TOC Initial Note (Addendum)
Transition of Care [facility]) - Initial/Assessment Note    Patient Details  Name: Tina Fields MRN: [id number] Date of Birth: 06-18-1946  [facility]) CM/SW Contact:    Maree Krabbe, LCSW Phone Number: 01/05/2021, 12:34 PM  Clinical Narrative:      CSW spoke with pt at bedside. Pt is agreeable to [facility]. Pt states she lives alone. Pt did not have a agency preference and would prefer a agency that will accept her insurance. Pt states she will need a walker.  Referral made to [place] with [facility] --they will service. Referral made to [employer]--they will deliver walker to the room.            CSW consulted for heart failure screen. Pt was screened and educated by Heart Failure Navigator [employer] on 5/4.  Expected Discharge Plan: Home w Home Health Services Barriers to Discharge: No Barriers Identified   Patient Goals and CMS Choice Patient states their goals for this hospitalization and ongoing recovery are:: to go home   Choice offered to / list presented to : Patient  Expected Discharge Plan and Services Expected Discharge Plan: Home w Home Health Services In-house Referral: Clinical Social Work   Post Acute Care Choice: Home Health Living arrangements for the past 2 months: Single Family Home Expected Discharge Date: 01/05/21               DME Arranged: Dan Humphreys DME Agency: [facility] Date DME Agency Contacted: 01/05/21 Time DME Agency Contacted: 1232 Representative spoke with at DME Agency: patricia HH Arranged: PT HH Agency: [facility] Date HH Agency Contacted: 01/05/21 Time HH Agency Contacted: 1233 Representative spoke with at [facility] Agency: brittany  Prior Living Arrangements/Services Living arrangements for the past 2 months: Single Family Home Lives with:: Self Patient language and need for interpreter reviewed:: Yes Do you feel safe going back to the place where you live?: Yes      Need for Family Participation in Patient Care: Yes  (Comment) Care giver support system in place?: Yes (comment)   Criminal Activity/Legal Involvement Pertinent to Current Situation/Hospitalization: No - Comment as needed  Activities of Daily Living Home Assistive Devices/Equipment: None ADL Screening (condition at time of admission) Patient's cognitive ability adequate to safely complete daily activities?: Yes Is the patient deaf or have difficulty hearing?: No Does the patient have difficulty seeing, even when wearing glasses/contacts?: No Does the patient have difficulty concentrating, remembering, or making decisions?: No Patient able to express need for assistance with ADLs?: Yes Does the patient have difficulty dressing or bathing?: No Independently performs ADLs?: Yes (appropriate for developmental age) Does the patient have difficulty walking or climbing stairs?: No Weakness of Legs: None Weakness of Arms/Hands: None  Permission Sought/Granted Permission sought to share information with : Family Supports    Share Information with NAME: dianna  Permission granted to share info w AGENCY: [facility]  Permission granted to share info w Relationship: relative     Emotional Assessment Appearance:: Appears stated age Attitude/Demeanor/Rapport: Engaged Affect (typically observed): Accepting Orientation: : Oriented to Self,Oriented to Place,Oriented to  Time,Oriented to Situation Alcohol / Substance Use: Not Applicable Psych Involvement: No (comment)  Admission diagnosis:  Acute on chronic combined systolic and diastolic CHF, NYHA class 1 (HCC) [I50.43] Patient Active Problem List   Diagnosis Date Noted  . Acute on chronic combined systolic and diastolic CHF (congestive heart failure) (HCC) 01/02/2021  . 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 HTN (hypertension) 08/21/2017  . GERD (gastroesophageal reflux  disease) 08/21/2017   PCP:  Oswaldo Conroy, MD Pharmacy:   [facility] [address] [place] - [address]  [address] [place] [address] Phone: [phone] Fax: [phone]     Social Determinants of Health (SDOH) Interventions    Readmission Risk Interventions No flowsheet data found.

## 2021-01-05 NOTE — Discharge Summary (Signed)
Physician Discharge Summary   Patient ID: Tina Fields MRN: [id number] DOB/AGE: Jun 09, 1946 75 y.o.  Admit date: 01/02/2021 Discharge date: 01/05/2021  Primary Care Physician:  Oswaldo Conroy, MD   Recommendations for Outpatient Follow-up:  1. Follow up with PCP in 1-2 weeks 2. Patient started on Lasix 40 mg daily.  Please follow renal panel, titrate lasix down or as needed at the follow-up assessment 3. Appointment with cardiology, Dr Juliann Pares scheduled  Home Health: Home health PT Equipment/Devices: Rolling walker bed 5 inches wheels  Discharge Condition: stable  CODE STATUS: FULL  Diet recommendation: Heart healthy diet   Discharge Diagnoses:    . Acute on chronic combined systolic and diastolic CHF (congestive heart failure) (HCC) . GERD (gastroesophageal reflux disease) . HTN (hypertension) Hypokalemia Hypomagnesemia Generalized debility   Consults: None    Allergies:   Allergies  Allergen Reactions  . Azithromycin Rash  . Latex Hives     DISCHARGE MEDICATIONS: Allergies as of 01/05/2021      Reactions   Azithromycin Rash   Latex Hives      Medication List    STOP taking these medications   meclizine 25 MG tablet Commonly known as: ANTIVERT   meloxicam 15 MG tablet Commonly known as: MOBIC   olmesartan 40 MG tablet Commonly known as: BENICAR   Tiadylt ER 360 MG 24 hr capsule Generic drug: diltiazem     TAKE these medications   albuterol 108 (90 Base) MCG/ACT inhaler Commonly known as: VENTOLIN HFA Inhale 1-2 puffs into the lungs every 6 (six) hours as needed.   aspirin 81 MG EC tablet Take 1 tablet (81 mg total) by mouth daily. Swallow whole. Start taking on: Jan 06, 2021   budesonide-formoterol 80-4.5 MCG/ACT inhaler Commonly known as: SYMBICORT Inhale 2 puffs into the lungs 2 (two) times daily as needed.   [employer] VITAMIN B12 1000 MCG tablet Generic drug: cyanocobalamin Take 1,000 mcg by mouth daily.   Entresto 24-26  MG Generic drug: sacubitril-valsartan Take 1 tablet by mouth daily.   fluticasone 50 MCG/ACT nasal spray Commonly known as: FLONASE Place 2 sprays into the nose daily.   furosemide 40 MG tablet Commonly known as: LASIX Take 1 tablet (40 mg total) by mouth daily. Start taking on: Jan 06, 2021 What changed:   how much to take  how to take this  when to take this  additional instructions   hydrALAZINE 25 MG tablet Commonly known as: APRESOLINE Take 1 tablet (25 mg total) by mouth 3 (three) times daily.   isosorbide mononitrate 30 MG 24 hr tablet Commonly known as: IMDUR Take 1 tablet (30 mg total) by mouth daily. Start taking on: Jan 06, 2021   metoprolol succinate 25 MG 24 hr tablet Commonly known as: TOPROL-XL Take 25 mg by mouth daily.   spironolactone 25 MG tablet Commonly known as: ALDACTONE Take 12.5 mg by mouth daily.   Vitamin D (Ergocalciferol) 1.25 MG (50000 UNIT) Caps capsule Commonly known as: DRISDOL Take 50,000 Units by mouth every 7 (seven) days.            Durable Medical Equipment  (From admission, onward)         Start     Ordered   01/05/21 1220  For home use only DME Walker rolling  Once       Question Answer Comment  Walker: With 5 Inch Wheels   Patient needs a walker to treat with the following condition Gait instability  01/05/21 1219           Brief H and P: For complete details please refer to admission H and P, but in brief Tina Fields is a 75 y.o. female with medical history significant for rheumatoid arthritis, hypertension, CAD, chronic combined systolic and diastolic CHF below presented to the hospital because of shortness of breath and bilateral leg edema.  She said she had been drinking a lot of sodas lately.  Reportedly when EMS picked from her house, her oxygen saturation was 90% and she was placed on 4 L/min oxygen.  She was admitted to the hospital for acute exacerbation of chronic systolic and  diastolic CHF.  She was treated with IV Lasix.   Hospital Course:     Acute on chronic combined systolic and diastolic CHF (congestive heart failure) (HCC) -Patient was placed on IV Lasix 40 mg twice a day for diuresis -continued on Entresto, spironolactone, beta-blocker -Negative balance of 2.6 L.  Weight down from 174.1lbs at the time of admission to 166lbs at the time of discharge -Transition to oral Lasix 40 mg daily, follow renal panel outpatient -Continue Entresto, spironolactone, started on isosorbide, hydralazine -Patient had 2D echo in 2/22 which showed EF of 30 to 35%, grade 2 diastolic dysfunction - outpatient cardiology referral to [facility] cardiology clinic, appointment scheduled     HTN (hypertension) - BP stable, continue Lasix, isosorbide, hydralazine, beta-blocker, Aldactone, Entresto   Hypokalemia, hypomagnesemia Improved, continue potassium replacement  Generalized weakness, gait instability PT evaluation done, recommended home health PT, rolling walker  Day of Discharge S: Doing well, feels she is back to her baseline, hoping to go home.  BP (!) 106/54 (BP Location: Left Arm)   Pulse (!) 102   Temp 97.7 F (36.5 C)   Resp 18   Ht 5\' 6"  (1.676 m)   Wt 75.7 kg   SpO2 95%   BMI 26.94 kg/m   Physical Exam: General: Alert and awake oriented x3 not in any acute distress. HEENT: anicteric sclera, pupils reactive to light and accommodation CVS: S1-S2 clear no murmur rubs or gallops Chest: clear to auscultation bilaterally, no wheezing rales or rhonchi Abdomen: soft nontender, nondistended, normal bowel sounds Extremities: no cyanosis, clubbing or edema noted bilaterally Neuro: Cranial nerves II-XII intact, no focal neurological deficits    Get Medicines reviewed and adjusted: Please take all your medications with you for your next visit with your Primary MD  Please request your Primary MD to go over all hospital tests and procedure/radiological results  at the follow up. Please ask your Primary MD to get all Hospital records sent to his/her office.  If you experience worsening of your admission symptoms, develop shortness of breath, life threatening emergency, suicidal or homicidal thoughts you must seek medical attention immediately by calling 911 or calling your MD immediately  if symptoms less severe.  You must read complete instructions/literature along with all the possible adverse reactions/side effects for all the Medicines you take and that have been prescribed to you. Take any new Medicines after you have completely understood and accept all the possible adverse reactions/side effects.   Do not drive when taking pain medications.   Do not take more than prescribed Pain, Sleep and Anxiety Medications  Special Instructions: If you have smoked or chewed Tobacco  in the last 2 yrs please stop smoking, stop any regular Alcohol  and or any Recreational drug use.  Wear Seat belts while driving.  Please note  You were cared  for by a hospitalist during your hospital stay. Once you are discharged, your primary care physician will handle any further medical issues. Please note that NO REFILLS for any discharge medications will be authorized once you are discharged, as it is imperative that you return to your primary care physician (or establish a relationship with a primary care physician if you do not have one) for your aftercare needs so that they can reassess your need for medications and monitor your lab values.   The results of significant diagnostics from this hospitalization (including imaging, microbiology, ancillary and laboratory) are listed below for reference.      Procedures/Studies:  DG Chest Port 1 View  Result Date: 01/02/2021 CLINICAL DATA:  Short of breath, congestive heart failure EXAM: PORTABLE CHEST 1 VIEW COMPARISON:  10/02/2016 FINDINGS: Single frontal view of the chest demonstrates an enlarged cardiac silhouette.  There is central vascular congestion, with bilateral interstitial prominence. Bibasilar airspace disease with right greater than left pleural effusions. No pneumothorax. No acute bony abnormalities. IMPRESSION: 1. Constellation of findings consistent with congestive heart failure. Electronically Signed   By: Sharlet Salina M.D.   On: 01/02/2021 15:52      LAB RESULTS: Basic Metabolic Panel: Recent Labs  Lab 01/04/21 0540 01/05/21 0327  NA 137 137  K 3.5 3.8  CL 101 101  CO2 24 23  GLUCOSE 106* 98  BUN 20 24*  CREATININE 0.77 0.93  CALCIUM 8.7* 8.9  MG 1.9 1.8   Liver Function Tests: Recent Labs  Lab 01/02/21 1521  AST 16  ALT 7  ALKPHOS 71  BILITOT 1.0  PROT 7.6  ALBUMIN 3.1*   No results for input(s): LIPASE, AMYLASE in the last 168 hours. No results for input(s): AMMONIA in the last 168 hours. CBC: Recent Labs  Lab 01/02/21 1521 01/05/21 0327  WBC 4.6 5.5  NEUTROABS 2.8  --   HGB 12.6 12.6  HCT 38.3 38.1  MCV 97.2 95.7  PLT 312 329   Cardiac Enzymes: No results for input(s): CKTOTAL, CKMB, CKMBINDEX, TROPONINI in the last 168 hours. BNP: Invalid input(s): POCBNP CBG: No results for input(s): GLUCAP in the last 168 hours.     Disposition and Follow-up: Discharge Instructions    (HEART FAILURE PATIENTS) Call MD:  Anytime you have any of the following symptoms: 1) 3 pound weight gain in 24 hours or 5 pounds in 1 week 2) shortness of breath, with or without a dry hacking cough 3) swelling in the hands, feet or stomach 4) if you have to sleep on extra pillows at night in order to breathe.   Complete by: As directed    Diet - low sodium heart healthy   Complete by: As directed    Increase activity slowly   Complete by: As directed        DISPOSITION: Home with home health   DISCHARGE FOLLOW-UP  Follow-up Information    Oswaldo Conroy, MD. Schedule an appointment as soon as possible for a visit on 01/19/2021.   Specialty: Family Medicine Why:  check metabolic panel for potassium and renal function.Marland Kitchen@ 9:40am Contact information: [address] [place] [address] [phone]        Alwyn Pea, MD On 01/18/2021.   Specialties: Cardiology, Internal Medicine Why: @ 8:30am Contact information: [address] [place] [address] [phone]                Time coordinating discharge:  35 mins   Signed:  Thad Ranger M.D. [facility] 01/05/2021, 1:06 PM

## 2021-01-13 ENCOUNTER — Ambulatory Visit: Payer: Medicare HMO | Admitting: Family

## 2021-01-18 ENCOUNTER — Emergency Department: Payer: Medicare HMO

## 2021-01-18 ENCOUNTER — Emergency Department
Admission: EM | Admit: 2021-01-18 | Discharge: 2021-01-18 | Disposition: A | Discharge status: Home / Self Care | Payer: Medicare HMO | Attending: Emergency Medicine | Admitting: Emergency Medicine

## 2021-01-18 ENCOUNTER — Other Ambulatory Visit: Payer: Self-pay

## 2021-01-18 DIAGNOSIS — Z91040 Latex allergy status: Secondary | ICD-10-CM | POA: Insufficient documentation

## 2021-01-18 DIAGNOSIS — R1011 Right upper quadrant pain: Secondary | ICD-10-CM | POA: Diagnosis present

## 2021-01-18 DIAGNOSIS — Z7982 Long term (current) use of aspirin: Secondary | ICD-10-CM | POA: Insufficient documentation

## 2021-01-18 DIAGNOSIS — R110 Nausea: Secondary | ICD-10-CM | POA: Diagnosis not present

## 2021-01-18 DIAGNOSIS — Z79899 Other long term (current) drug therapy: Secondary | ICD-10-CM | POA: Diagnosis not present

## 2021-01-18 DIAGNOSIS — I5043 Acute on chronic combined systolic (congestive) and diastolic (congestive) heart failure: Secondary | ICD-10-CM | POA: Diagnosis not present

## 2021-01-18 DIAGNOSIS — J45909 Unspecified asthma, uncomplicated: Secondary | ICD-10-CM | POA: Diagnosis not present

## 2021-01-18 DIAGNOSIS — K8050 Calculus of bile duct without cholangitis or cholecystitis without obstruction: Secondary | ICD-10-CM

## 2021-01-18 DIAGNOSIS — K219 Gastro-esophageal reflux disease without esophagitis: Secondary | ICD-10-CM | POA: Diagnosis not present

## 2021-01-18 DIAGNOSIS — I110 Hypertensive heart disease with heart failure: Secondary | ICD-10-CM | POA: Diagnosis not present

## 2021-01-18 DIAGNOSIS — Z87891 Personal history of nicotine dependence: Secondary | ICD-10-CM | POA: Diagnosis not present

## 2021-01-18 LAB — COMPREHENSIVE METABOLIC PANEL
ALT: 7 U/L (ref 0–44)
AST: 15 U/L (ref 15–41)
Albumin: 3 g/dL — ABNORMAL LOW (ref 3.5–5.0)
Alkaline Phosphatase: 64 U/L (ref 38–126)
Anion gap: 11 (ref 5–15)
BUN: 31 mg/dL — ABNORMAL HIGH (ref 8–23)
CO2: 23 mmol/L (ref 22–32)
Calcium: 9.1 mg/dL (ref 8.9–10.3)
Chloride: 100 mmol/L (ref 98–111)
Creatinine, Ser: 1.33 mg/dL — ABNORMAL HIGH (ref 0.44–1.00)
GFR, Estimated: 41 mL/min — ABNORMAL LOW (ref >60)
Glucose, Bld: 124 mg/dL — ABNORMAL HIGH (ref 70–99)
Potassium: 3.6 mmol/L (ref 3.5–5.1)
Sodium: 134 mmol/L — ABNORMAL LOW (ref 135–145)
Total Bilirubin: 0.9 mg/dL (ref 0.3–1.2)
Total Protein: 7.8 g/dL (ref 6.5–8.1)

## 2021-01-18 LAB — LIPASE, BLOOD: Lipase: 55 U/L — ABNORMAL HIGH (ref 11–51)

## 2021-01-18 LAB — CBC
HCT: 38.3 % (ref 36.0–46.0)
Hemoglobin: 12.4 g/dL (ref 12.0–15.0)
MCH: 31.1 pg (ref 26.0–34.0)
MCHC: 32.4 g/dL (ref 30.0–36.0)
MCV: 96 fL (ref 80.0–100.0)
Platelets: 329 10*3/uL (ref 150–400)
RBC: 3.99 MIL/uL (ref 3.87–5.11)
RDW: 13.4 % (ref 11.5–15.5)
WBC: 6.8 10*3/uL (ref 4.0–10.5)
nRBC: 0 % (ref 0.0–0.2)

## 2021-01-18 MED ORDER — SODIUM CHLORIDE 0.9 % IV BOLUS: 1000.0000 mL | Freq: Once | INTRAVENOUS | Status: DC

## 2021-01-18 MED ORDER — MORPHINE SULFATE (PF) 4 MG/ML IV SOLN
4.0000 mg | Freq: Once | INTRAVENOUS | Status: AC
Start: 2021-01-18 — End: 2021-01-18
  Administered 2021-01-18: 4 mg via INTRAVENOUS
  Filled 2021-01-18: qty 1

## 2021-01-18 MED ORDER — ONDANSETRON HCL 4 MG/2ML IJ SOLN
4.0000 mg | Freq: Once | INTRAMUSCULAR | Status: AC
  Administered 2021-01-18: 4 mg via INTRAVENOUS
  Filled 2021-01-18: qty 2

## 2021-01-18 NOTE — ED Notes (Signed)
Pt's O2 dropped to 79% after morphine admin with good pleth. Pt placed on 4L/min of O2 and educated to breath through her nose and out through her mouth O2 sats back to 100% at this time.

## 2021-01-18 NOTE — ED Provider Notes (Signed)
[facility] Emergency Department Provider Note  Time seen: 7:34 AM  I have reviewed the triage vital signs and the nursing notes.   HISTORY  Chief Complaint Abdominal Pain and Nausea   HPI Tina Fields is a 75 y.o. female with a past medical history of gastric reflux, hypertension, rheumatoid arthritis, presents to the emergency department for right upper quadrant abdominal pain.  According to the patient for the past 2 days association experiencing pain in the right upper quadrant of her abdomen as well as nausea.  Denies any vomiting or diarrhea.  Denies black or bloody stool.  No dysuria or hematuria, no fever.  Describes her pain as severe 10/10 pain.   Past Medical History:  Diagnosis Date  . Acid reflux   . Asthma   . Collagen vascular disease (HCC)    RA  . Edema of both legs   . Hypertension   . RA (rheumatoid arthritis) [facility])     Patient Active Problem List   Diagnosis Date Noted  . Acute on chronic combined systolic and diastolic CHF (congestive heart failure) (HCC) 01/02/2021  . 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 HTN (hypertension) 08/21/2017  . GERD (gastroesophageal reflux disease) 08/21/2017    Past Surgical History:  Procedure Laterality Date  . TUBAL LIGATION      Prior to Admission medications   Medication Sig Start Date End Date Taking? Authorizing Provider  albuterol (VENTOLIN HFA) 108 (90 Base) MCG/ACT inhaler Inhale 1-2 puffs into the lungs every 6 (six) hours as needed. 11/06/20   [provider]  aspirin EC 81 MG EC tablet Take 1 tablet (81 mg total) by mouth daily. Swallow whole. 01/06/21   Rai, Delene Ruffini, MD  budesonide-formoterol (SYMBICORT) 80-4.5 MCG/ACT inhaler Inhale 2 puffs into the lungs 2 (two) times daily as needed. 12/03/18   [provider]  CVS VITAMIN B12 1000 MCG tablet Take 1,000 mcg by mouth daily. 08/04/20   [provider]  ENTRESTO 24-26 MG Take 1 tablet by mouth daily.  11/02/20   [provider]  fluticasone (FLONASE) 50 MCG/ACT nasal spray Place 2 sprays into the nose daily. 12/03/18   [provider]  furosemide (LASIX) 40 MG tablet Take 1 tablet (40 mg total) by mouth daily. 01/06/21   Rai, Delene Ruffini, MD  hydrALAZINE (APRESOLINE) 25 MG tablet Take 1 tablet (25 mg total) by mouth 3 (three) times daily. 01/05/21   Rai, Delene Ruffini, MD  isosorbide mononitrate (IMDUR) 30 MG 24 hr tablet Take 1 tablet (30 mg total) by mouth daily. 01/06/21   Rai, Delene Ruffini, MD  metoprolol succinate (TOPROL-XL) 25 MG 24 hr tablet Take 25 mg by mouth daily.    [provider]  spironolactone (ALDACTONE) 25 MG tablet Take 12.5 mg by mouth daily. 10/29/20   [provider]  Vitamin D, Ergocalciferol, (DRISDOL) 1.25 MG (50000 UNIT) CAPS capsule Take 50,000 Units by mouth every 7 (seven) days. 09/11/20   [provider]    Allergies  Allergen Reactions  . Azithromycin Rash  . Latex Hives    Family History  Problem Relation Age of Onset  . CAD Mother   . Diabetes Mother   . Hypertension Mother   . CAD Father   . Diabetes Father   . Hypertension Father   . Stroke Father     Social History Social History   Tobacco Use  . Smoking status: Former Smoker    Quit date: 01/09/1995  Years since quitting: 26.0  . Smokeless tobacco: Never Used  Vaping Use  . Vaping Use: Never used  Substance Use Topics  . Alcohol use: No  . Drug use: No    Review of Systems Constitutional: Negative for fever. Cardiovascular: Negative for chest pain. Respiratory: Negative for shortness of breath. Gastrointestinal: Severe aching right upper quadrant abdominal pain.  Positive for nausea but negative for vomiting or diarrhea Genitourinary: Negative for urinary compaints Musculoskeletal: Negative for musculoskeletal complaints Neurological: Negative for headache All other ROS negative  ____________________________________________   PHYSICAL  EXAM:  VITAL SIGNS: ED Triage Vitals [01/18/21 0732]  Enc Vitals Group     BP 136/77     Pulse Rate 84     Resp (!) 21     Temp (!) 97.5 F (36.4 C)     Temp Source Oral     SpO2 99 %     Weight      Height      Head Circumference      Peak Flow      Pain Score      Pain Loc      Pain Edu?      Excl. in GC?    Constitutional: Alert and oriented. Well appearing and in no distress. Eyes: Normal exam ENT      Head: Normocephalic and atraumatic.      Mouth/Throat: Mucous membranes are moist. Cardiovascular: Normal rate, regular rhythm.  Respiratory: Normal respiratory effort without tachypnea nor retractions. Breath sounds are clear  Gastrointestinal: Soft, moderate right upper quadrant abdominal tenderness to palpation.  No rebound or guarding.  No distention. Musculoskeletal: Nontender with normal range of motion in all extremities.  Neurologic:  Normal speech and language. No gross focal neurologic deficits are appreciated. Skin:  Skin is warm, dry and intact.  Psychiatric: Mood and affect are normal. Speech and behavior are normal.   ____________________________________________    EKG  EKG viewed and interpreted by myself shows a normal sinus rhythm 84 bpm with a narrow QRS, normal axis, normal intervals, no concerning ST changes.  ____________________________________________    RADIOLOGY  CT negative for acute abnormality besides gallbladder filled with gallstones. Ultrasound shows cholelithiasis with no signs of acute cholecystitis or bile duct dilation.  ____________________________________________   INITIAL IMPRESSION / ASSESSMENT AND PLAN / ED COURSE  Pertinent labs & imaging results that were available during my care of the patient were reviewed by me and considered in my medical decision making (see chart for details).   Patient presents to the emergency department for right upper quadrant abdominal pain over the past 2 to 3 days along with nausea.   Patient does have moderate tenderness to the right upper quadrant.  States she is not had her gallbladder removed.  We will check labs, right upper quadrant ultrasound, treat pain nausea and IV hydrate.  Differential would include biliary colic, cholecystitis, pancreatitis, diverticulitis UTI or pyelonephritis.  Work-up is overall reassuring.  Patient does have a gallbladder full of gallstones and would likely benefit from cholecystectomy however no sign of cholecystitis or bile duct obstruction.  We will discharge patient with surgery follow-up.  Patient states she feels much better.  Paizlee Kinder was evaluated in Emergency Department on 01/18/2021 for the symptoms described in the history of present illness. She was evaluated in the context of the global COVID-19 pandemic, which necessitated consideration that the patient might be at risk for infection with the SARS-CoV-2 virus that causes COVID-19. Institutional protocols and  algorithms that pertain to the evaluation of patients at risk for COVID-19 are in a state of rapid change based on information released by regulatory bodies including the CDC and federal and state organizations. These policies and algorithms were followed during the patient's care in the ED.  ____________________________________________   FINAL CLINICAL IMPRESSION(S) / ED DIAGNOSES  Right upper quadrant abdominal pain   Minna Antis, MD 01/18/21 1408

## 2021-01-18 NOTE — ED Notes (Signed)
Pt presents to ED with c/o of lower ABD pain that started at 0300 this morning. Pt also has c/o of nausea but denies vomiting at this time. Pt denies fevers or chills. Pt denies chest pain or SOB. Pt denies urinary symptoms.   Pt states being "here recently" but cannot tell this RN why.   Pt is A&Ox4.

## 2021-02-07 ENCOUNTER — Ambulatory Visit: Payer: Self-pay | Admitting: Surgery

## 2021-02-28 ENCOUNTER — Ambulatory Visit: Payer: Self-pay | Admitting: Surgery

## 2021-03-02 ENCOUNTER — Other Ambulatory Visit: Payer: Self-pay

## 2021-03-02 ENCOUNTER — Emergency Department: Payer: Medicare Other

## 2021-03-02 ENCOUNTER — Encounter: Payer: Self-pay | Admitting: Emergency Medicine

## 2021-03-02 ENCOUNTER — Inpatient Hospital Stay
Admission: EM | Admit: 2021-03-02 | Discharge: 2021-03-04 | DRG: 315 | Disposition: A | Discharge status: Home / Self Care | Payer: Medicare Other | Attending: Internal Medicine | Admitting: Internal Medicine

## 2021-03-02 DIAGNOSIS — K219 Gastro-esophageal reflux disease without esophagitis: Secondary | ICD-10-CM | POA: Diagnosis present

## 2021-03-02 DIAGNOSIS — I514 Myocarditis, unspecified: Principal | ICD-10-CM | POA: Diagnosis present

## 2021-03-02 DIAGNOSIS — R42 Dizziness and giddiness: Secondary | ICD-10-CM | POA: Diagnosis present

## 2021-03-02 DIAGNOSIS — I952 Hypotension due to drugs: Secondary | ICD-10-CM

## 2021-03-02 DIAGNOSIS — Z8249 Family history of ischemic heart disease and other diseases of the circulatory system: Secondary | ICD-10-CM | POA: Diagnosis not present

## 2021-03-02 DIAGNOSIS — I110 Hypertensive heart disease with heart failure: Secondary | ICD-10-CM | POA: Diagnosis not present

## 2021-03-02 DIAGNOSIS — U099 Post covid-19 condition, unspecified: Secondary | ICD-10-CM | POA: Diagnosis not present

## 2021-03-02 DIAGNOSIS — Z20822 Contact with and (suspected) exposure to covid-19: Secondary | ICD-10-CM | POA: Diagnosis not present

## 2021-03-02 DIAGNOSIS — J449 Chronic obstructive pulmonary disease, unspecified: Secondary | ICD-10-CM | POA: Diagnosis not present

## 2021-03-02 DIAGNOSIS — Z7982 Long term (current) use of aspirin: Secondary | ICD-10-CM

## 2021-03-02 DIAGNOSIS — I959 Hypotension, unspecified: Secondary | ICD-10-CM | POA: Diagnosis present

## 2021-03-02 DIAGNOSIS — Z823 Family history of stroke: Secondary | ICD-10-CM

## 2021-03-02 DIAGNOSIS — M069 Rheumatoid arthritis, unspecified: Secondary | ICD-10-CM | POA: Diagnosis present

## 2021-03-02 DIAGNOSIS — D649 Anemia, unspecified: Secondary | ICD-10-CM

## 2021-03-02 DIAGNOSIS — Z79899 Other long term (current) drug therapy: Secondary | ICD-10-CM | POA: Diagnosis not present

## 2021-03-02 DIAGNOSIS — E876 Hypokalemia: Secondary | ICD-10-CM | POA: Diagnosis present

## 2021-03-02 DIAGNOSIS — Z833 Family history of diabetes mellitus: Secondary | ICD-10-CM | POA: Diagnosis not present

## 2021-03-02 DIAGNOSIS — I5042 Chronic combined systolic (congestive) and diastolic (congestive) heart failure: Secondary | ICD-10-CM | POA: Diagnosis present

## 2021-03-02 DIAGNOSIS — M2540 Effusion, unspecified joint: Secondary | ICD-10-CM

## 2021-03-02 DIAGNOSIS — I214 Non-ST elevation (NSTEMI) myocardial infarction: Secondary | ICD-10-CM | POA: Diagnosis not present

## 2021-03-02 DIAGNOSIS — N823 Fistula of vagina to large intestine: Secondary | ICD-10-CM | POA: Diagnosis present

## 2021-03-02 DIAGNOSIS — K8020 Calculus of gallbladder without cholecystitis without obstruction: Secondary | ICD-10-CM

## 2021-03-02 DIAGNOSIS — N888 Other specified noninflammatory disorders of cervix uteri: Secondary | ICD-10-CM

## 2021-03-02 DIAGNOSIS — R778 Other specified abnormalities of plasma proteins: Secondary | ICD-10-CM | POA: Diagnosis present

## 2021-03-02 DIAGNOSIS — Z91040 Latex allergy status: Secondary | ICD-10-CM

## 2021-03-02 DIAGNOSIS — Z7951 Long term (current) use of inhaled steroids: Secondary | ICD-10-CM | POA: Diagnosis not present

## 2021-03-02 DIAGNOSIS — Z87891 Personal history of nicotine dependence: Secondary | ICD-10-CM

## 2021-03-02 DIAGNOSIS — R55 Syncope and collapse: Secondary | ICD-10-CM

## 2021-03-02 LAB — CBC WITH DIFFERENTIAL/PLATELET
Abs Immature Granulocytes: 0.02 10*3/uL (ref 0.00–0.07)
Abs Immature Granulocytes: 0.04 10*3/uL (ref 0.00–0.07)
Basophils Absolute: 0 10*3/uL (ref 0.0–0.1)
Basophils Absolute: 0 10*3/uL (ref 0.0–0.1)
Basophils Relative: 0 %
Basophils Relative: 0 %
Eosinophils Absolute: 0 10*3/uL (ref 0.0–0.5)
Eosinophils Absolute: 0 10*3/uL (ref 0.0–0.5)
Eosinophils Relative: 0 %
Eosinophils Relative: 0 %
HCT: 20.5 % — ABNORMAL LOW (ref 36.0–46.0)
HCT: 34.5 % — ABNORMAL LOW (ref 36.0–46.0)
Hemoglobin: 6.6 g/dL — ABNORMAL LOW (ref 12.0–15.0)
Hemoglobin: 11.3 g/dL — ABNORMAL LOW (ref 12.0–15.0)
Immature Granulocytes: 1 %
Immature Granulocytes: 1 %
Lymphocytes Relative: 15 %
Lymphocytes Relative: 14 %
Lymphs Abs: 0.5 10*3/uL — ABNORMAL LOW (ref 0.7–4.0)
Lymphs Abs: 0.8 10*3/uL (ref 0.7–4.0)
MCH: 31.4 pg (ref 26.0–34.0)
MCH: 31.3 pg (ref 26.0–34.0)
MCHC: 32.2 g/dL (ref 30.0–36.0)
MCHC: 32.8 g/dL (ref 30.0–36.0)
MCV: 97.6 fL (ref 80.0–100.0)
MCV: 95.6 fL (ref 80.0–100.0)
Monocytes Absolute: 0.4 10*3/uL (ref 0.1–1.0)
Monocytes Absolute: 0.5 10*3/uL (ref 0.1–1.0)
Monocytes Relative: 11 %
Monocytes Relative: 9 %
Neutro Abs: 2.5 10*3/uL (ref 1.7–7.7)
Neutro Abs: 4.4 10*3/uL (ref 1.7–7.7)
Neutrophils Relative %: 73 %
Neutrophils Relative %: 76 %
Platelets: 216 10*3/uL (ref 150–400)
Platelets: 353 10*3/uL (ref 150–400)
RBC: 2.1 MIL/uL — ABNORMAL LOW (ref 3.87–5.11)
RBC: 3.61 MIL/uL — ABNORMAL LOW (ref 3.87–5.11)
RDW: 14.3 % (ref 11.5–15.5)
RDW: 14.1 % (ref 11.5–15.5)
WBC: 3.5 10*3/uL — ABNORMAL LOW (ref 4.0–10.5)
WBC: 5.9 10*3/uL (ref 4.0–10.5)
nRBC: 0 % (ref 0.0–0.2)
nRBC: 0 % (ref 0.0–0.2)

## 2021-03-02 LAB — IRON AND TIBC
Iron: 24 ug/dL — ABNORMAL LOW (ref 28–170)
Saturation Ratios: 11 % (ref 10.4–31.8)
TIBC: 217 ug/dL — ABNORMAL LOW (ref 250–450)
UIBC: 193 ug/dL

## 2021-03-02 LAB — COMPREHENSIVE METABOLIC PANEL
ALT: 7 U/L (ref 0–44)
AST: 14 U/L — ABNORMAL LOW (ref 15–41)
Albumin: 2.5 g/dL — ABNORMAL LOW (ref 3.5–5.0)
Alkaline Phosphatase: 78 U/L (ref 38–126)
Anion gap: 11 (ref 5–15)
BUN: 17 mg/dL (ref 8–23)
CO2: 24 mmol/L (ref 22–32)
Calcium: 8.5 mg/dL — ABNORMAL LOW (ref 8.9–10.3)
Chloride: 103 mmol/L (ref 98–111)
Creatinine, Ser: 1.05 mg/dL — ABNORMAL HIGH (ref 0.44–1.00)
GFR, Estimated: 55 mL/min — ABNORMAL LOW (ref >60)
Glucose, Bld: 92 mg/dL (ref 70–99)
Potassium: 3.4 mmol/L — ABNORMAL LOW (ref 3.5–5.1)
Sodium: 138 mmol/L (ref 135–145)
Total Bilirubin: 0.7 mg/dL (ref 0.3–1.2)
Total Protein: 7.2 g/dL (ref 6.5–8.1)

## 2021-03-02 LAB — ABO/RH: ABO/RH(D): O POS

## 2021-03-02 LAB — TROPONIN I (HIGH SENSITIVITY)
Troponin I (High Sensitivity): 439 ng/L (ref <18)
Troponin I (High Sensitivity): 576 ng/L (ref <18)

## 2021-03-02 LAB — FERRITIN: Ferritin: 145 ng/mL (ref 11–307)

## 2021-03-02 MED ORDER — SODIUM CHLORIDE 0.9 % IV BOLUS
1000.0000 mL | Freq: Once | INTRAVENOUS | Status: AC
  Administered 2021-03-02: 1000 mL via INTRAVENOUS

## 2021-03-02 MED ORDER — HEPARIN BOLUS VIA INFUSION
4000.0000 [IU] | Freq: Once | INTRAVENOUS | Status: AC
  Administered 2021-03-03: 4000 [IU] via INTRAVENOUS
  Filled 2021-03-02: qty 4000

## 2021-03-02 MED ORDER — SODIUM CHLORIDE 0.9 % IV SOLN: 10.0000 mL/h | Freq: Once | INTRAVENOUS | Status: DC

## 2021-03-02 MED ORDER — HEPARIN (PORCINE) 25000 UT/250ML-% IV SOLN
900.0000 [IU]/h | INTRAVENOUS | Status: DC
  Administered 2021-03-03: 900 [IU]/h via INTRAVENOUS
  Filled 2021-03-02: qty 250

## 2021-03-02 NOTE — ED Triage Notes (Signed)
Brought by EMS from home after reporting dizziness and weakness at home. BP Low en route, 80s/50s. CBG 201. Pt is a/o x 4 a nd answers questions appropriately. Fine crackles in bilateral lung bases per EMS. Pt denies injury.

## 2021-03-02 NOTE — Progress Notes (Signed)
ANTICOAGULATION CONSULT NOTE  Pharmacy Consult for heparin infusion Indication: ACS/nSTEMI  Allergies  Allergen Reactions   Azithromycin Rash   Latex Hives    Patient Measurements: Height: 5\' 6"  (167.6 cm) Weight: 69.4 kg (153 lb) IBW/kg (Calculated) : 59.3 Heparin Dosing Weight: 69.4 kg  Vital Signs: Temp: 96.8 F (36 C) (06/29 2004) Temp Source: Axillary (06/29 2004) BP: 110/66 (06/29 2300) Pulse Rate: 71 (06/29 2300)  Labs: Recent Labs    03/02/21 2037 03/02/21 2157  HGB 6.6* 11.3*  HCT 20.5* 34.5*  PLT 216 353  CREATININE 1.05*  --   TROPONINIHS 439* 576*    Estimated Creatinine Clearance: 42 mL/min (A) (by C-G formula based on SCr of 1.05 mg/dL (H)).   Medical History: Past Medical History:  Diagnosis Date   Acid reflux    Asthma    Collagen vascular disease (HCC)    RA   Edema of both legs    Hypertension    RA (rheumatoid arthritis) (HCC)    Assessment: Pt is 75 yo female presenting to ED c/o generalized malaise, lightheadedness, and presyncope followed by vomiting without bloody or bilious vomitus.  Pt found with mild hypotension and elevated Troponin I, leading to concern for possible nSTEMI.   Goal of Therapy:  Heparin level 0.3-0.7 units/ml Monitor platelets by anticoagulation protocol: Yes   Plan:  Give 4000 units bolus x 1 Start heparin infusion at 900 units/hr Check anti-Xa level in 8 hours and daily while on heparin Continue to monitor H&H and platelets  60, PharmD, [facility] 03/02/2021 11:39 PM

## 2021-03-02 NOTE — ED Provider Notes (Signed)
[facility] Emergency Department Provider Note  ____________________________________________  Time seen: Approximately 11:27 PM  I have reviewed the triage vital signs and the nursing notes.   HISTORY  Chief Complaint Hypotension    HPI Tina Fields is a 75 y.o. female with a past history of GERD, hypertension, CHF who reports being in her usual state of health earlier today when she started having lightheadedness after eating salad for dinner.  She did not fall or hit her head, but family reports that she seemed to be unconscious briefly.  She then vomited and felt better.  EMS was called and noted initial blood pressure of about 80/50.  Patient denies chest pain or shortness of breath.  Feels better now but still very fatigued.  She does note making a roundtrip by car to [place] within the past month.  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 Authorizing Provider  albuterol (VENTOLIN HFA) 108 (90 Base) MCG/ACT inhaler Inhale 1-2 puffs into the lungs every 6 (six) hours as needed. 11/06/20   [provider]  aspirin EC 81 MG EC tablet Take 1 tablet (81 mg total) by mouth daily. Swallow whole. 01/06/21   Rai, Delene Ruffini, MD  budesonide-formoterol (SYMBICORT) 80-4.5 MCG/ACT inhaler Inhale 2 puffs into the lungs 2 (two) times daily as needed. 12/03/18   [provider]  [employer] VITAMIN B12 1000 MCG tablet Take 1,000 mcg by mouth daily. 08/04/20   [provider]  ENTRESTO 24-26 MG Take 1 tablet by mouth daily. 11/02/20   [provider]  fluticasone (FLONASE) 50 MCG/ACT nasal spray Place 2 sprays into the nose daily. 12/03/18   [provider]  furosemide (LASIX) 40 MG tablet Take 1 tablet (40 mg total) by mouth daily. 01/06/21   Rai, Delene Ruffini, MD  hydrALAZINE (APRESOLINE) 25 MG tablet Take 1 tablet (25 mg total) by mouth 3 (three) times daily. 01/05/21   Rai, Delene Ruffini, MD  isosorbide mononitrate (IMDUR) 30 MG 24 hr tablet Take 1 tablet (30 mg total) by mouth daily. 01/06/21   Rai, Delene Ruffini, MD  metoprolol succinate (TOPROL-XL) 25 MG 24 hr tablet Take 25 mg by mouth daily.    [provider]  spironolactone (ALDACTONE) 25 MG tablet Take 12.5 mg by mouth daily. 10/29/20   [provider]  Vitamin D, Ergocalciferol, (DRISDOL) 1.25 MG (50000 UNIT) CAPS capsule Take 50,000 Units by mouth every 7 (seven) days. 09/11/20   [provider]     Allergies Azithromycin and Latex   Family History  Problem Relation Age of Onset   CAD Mother    Diabetes Mother    Hypertension Mother    CAD Father    Diabetes Father    Hypertension Father    Stroke Father     Social History Social  History   Tobacco Use   Smoking status: Former    Pack years: 0.00    Types: Cigarettes    Quit date: 01/09/1995    Years since quitting: 26.1   Smokeless tobacco: Never  Vaping Use   Vaping Use: Never used  Substance Use Topics   Alcohol use: No   Drug use: No    Review of Systems  Constitutional:   No fever or chills.  ENT:   No sore throat. No rhinorrhea. Cardiovascular:   No chest pain, positive Neer syncope. Respiratory:   No dyspnea or cough. Gastrointestinal:   Negative for abdominal pain, vomiting and diarrhea.  Musculoskeletal:   Negative for focal pain or swelling All other systems reviewed and are negative except  as documented above in ROS and HPI.  ____________________________________________   PHYSICAL EXAM:  VITAL SIGNS: ED Triage Vitals  Enc Vitals Group     BP 03/02/21 2004 95/60     Pulse Rate 03/02/21 2004 82     Resp 03/02/21 2004 18     Temp 03/02/21 2004 (!) 96.8 F (36 C)     Temp Source 03/02/21 2004 Axillary     SpO2 03/02/21 2003 100 %     Weight 03/02/21 2005 153 lb (69.4 kg)     Height 03/02/21 2005 5\' 6"  (1.676 m)     Head Circumference --      Peak Flow --      Pain Score 03/02/21 2005 0     Pain Loc --      Pain Edu? --      Excl. in GC? --     Vital signs reviewed, nursing assessments reviewed.   Constitutional:   Alert and oriented. Non-toxic appearance. Eyes:   Conjunctivae are normal. EOMI. PERRL. ENT      Head:   Normocephalic and atraumatic.      Nose:   Wearing a mask.      Mouth/Throat:   Wearing a mask.      Neck:   No meningismus. Full ROM. Hematological/Lymphatic/Immunilogical:   No cervical lymphadenopathy. Cardiovascular:   RRR. Symmetric bilateral radial and DP pulses.  No murmurs. Cap refill less than 2 seconds. Respiratory:   Normal respiratory effort without tachypnea/retractions. Breath sounds are clear and equal bilaterally. No wheezes/rales/rhonchi. Gastrointestinal:   Soft and nontender. Non distended. There is no CVA tenderness.  No rebound, rigidity, or guarding.  Rectal exam reveals brown stool, Hemoccult negative Genitourinary:   deferred Musculoskeletal:   Normal range of motion in all extremities. No joint effusions.  No lower extremity tenderness.  No edema. Neurologic:   Normal speech and language.  Motor grossly intact. No acute focal neurologic deficits are appreciated.  Skin:    Skin is warm, dry and intact. No rash noted.  No petechiae, purpura, or bullae.  ____________________________________________    LABS (pertinent positives/negatives) (all labs ordered are listed, but only abnormal results are displayed) Labs  Reviewed  CBC WITH DIFFERENTIAL/PLATELET - Abnormal; Notable for the following components:      Result Value   WBC 3.5 (*)    RBC 2.10 (*)    Hemoglobin 6.6 (*)    HCT 20.5 (*)    Lymphs Abs 0.5 (*)    All other components within normal limits  IRON AND TIBC - Abnormal; Notable for the following components:   Iron 24 (*)    TIBC 217 (*)    All other components within normal limits  COMPREHENSIVE METABOLIC PANEL -  Abnormal; Notable for the following components:   Potassium 3.4 (*)    Creatinine, Ser 1.05 (*)    Calcium 8.5 (*)    Albumin 2.5 (*)    AST 14 (*)    GFR, Estimated 55 (*)    All other components within normal limits  TROPONIN I (HIGH SENSITIVITY) - Abnormal; Notable for the following components:   Troponin I (High Sensitivity) 439 (*)    All other components within normal limits  TROPONIN I (HIGH SENSITIVITY) - Abnormal; Notable for the following components:   Troponin I (High Sensitivity) 576 (*)    All other components within normal limits  RESP PANEL BY RT-PCR (FLU A&B, COVID) ARPGX2  FERRITIN  URINALYSIS, COMPLETE (UACMP) WITH MICROSCOPIC  CBC WITH DIFFERENTIAL/PLATELET  TYPE AND SCREEN  PREPARE RBC (CROSSMATCH)  ABO/RH   ____________________________________________   EKG  Interpreted by me Sinus rhythm rate of 78, normal axis and intervals.  Normal QRS and ST segments.  Slight T wave inversions in lateral leads.  ____________________________________________    RADIOLOGY  US Venous Img Lower Unilateral Left  Result Date: 03/02/2021 CLINICAL DATA:  Pain, swelling EXAM: LEFT LOWER EXTREMITY VENOUS DOPPLER ULTRASOUND TECHNIQUE: Gray-scale sonography with compression, as well as color and duplex ultrasound, were performed to evaluate the deep venous system(s) from the level of the common femoral vein through the popliteal and proximal calf veins. COMPARISON:  None. FINDINGS: VENOUS Normal compressibility of the common femoral, superficial femoral, and  popliteal veins, as well as the visualized calf veins. Visualized portions of profunda femoral vein and great saphenous vein unremarkable. No filling defects to suggest DVT on grayscale or color Doppler imaging. Doppler waveforms show normal direction of venous flow, normal respiratory plasticity and response to augmentation. Limited views of the contralateral common femoral vein are unremarkable. OTHER Popliteal cyst measures 11.5 x 1.6 x 1.4 cm Limitations: none IMPRESSION: No evidence of left lower extremity DVT. Baker's cyst. Electronically Signed   By: Charlett Nose M.D.   On: 03/02/2021 22:46   DG Chest Portable 1 View  Result Date: 03/02/2021 CLINICAL DATA:  75 year old female with near syncope. EXAM: PORTABLE CHEST 1 VIEW COMPARISON:  Chest radiograph dated 01/02/2021. FINDINGS: There is mild if interstitial coarsening and bibasilar atelectasis/scarring. No focal consolidation, pleural effusion, or pneumothorax. Mild cardiomegaly. Atherosclerotic calcification of the aorta. No acute osseous pathology. Degenerative changes of the spine. IMPRESSION: No acute cardiopulmonary process. Electronically Signed   By: Elgie Collard M.D.   On: 03/02/2021 20:21    ____________________________________________   PROCEDURES .Critical Care  Date/Time: 03/03/2021 12:11 AM Performed by: Sharman Cheek, MD Authorized by: Sharman Cheek, MD   Critical care provider statement:    Critical care time (minutes):  35   Critical care time was exclusive of:  Separately billable procedures and treating other patients   Critical care was necessary to treat or prevent imminent or life-threatening deterioration of the following conditions:  Cardiac failure   Critical care was time spent personally by me on the following activities:  Development of treatment plan with patient or surrogate, discussions with consultants, evaluation of patient's response to treatment, examination of patient, obtaining history from  patient or surrogate, ordering and performing treatments and interventions, ordering and review of laboratory studies, ordering and review of radiographic studies, pulse oximetry, re-evaluation of patient's condition and review of old charts  ____________________________________________  DIFFERENTIAL DIAGNOSIS   Electrolyte abnormality, anemia, dehydration, vagal episode, non-STEMI  CLINICAL IMPRESSION / ASSESSMENT AND PLAN / ED COURSE  Medications ordered in the ED: Medications  0.9 %  sodium chloride infusion (0 mL/hr Intravenous Hold 03/02/21 2145)  sodium chloride 0.9 % bolus 1,000 mL (0 mLs Intravenous Stopped 03/02/21 2303)    Pertinent labs & imaging results that were available during my care of the patient were reviewed by me and considered in my medical decision making (see chart for details).  Tina Fields was evaluated in Emergency Department on 03/02/2021 for the symptoms described in the history of present illness. She was evaluated in the context of the global COVID-19 pandemic, which necessitated consideration that the patient might be at risk for infection with the SARS-CoV-2 virus that causes COVID-19. Institutional protocols and algorithms that pertain to the evaluation of patients at risk for COVID-19 are in a state of rapid change based on information released by regulatory bodies including the CDC and federal and state organizations. These policies and algorithms were followed during the patient's care in the ED.   Patient presents with near syncope and fatigue.  Initial hypotension which quickly resolved with IV fluids.  Found to have rising troponins.  EKG shows mild lateral T wave inversions but no ST changes.  We will start heparin for NSTEMI.  Initial hemoglobin was 6.  However, Hemoccult negative, no evidence of acute blood loss.  Doubt bone marrow failure.  Repeat hemoglobin is 11, no transfusion needed.       ____________________________________________   FINAL CLINICAL IMPRESSION(S) / ED DIAGNOSES    Final diagnoses:  Near syncope  NSTEMI (non-ST elevated myocardial infarction) (HCC)  Symptomatic anemia     ED Discharge Orders     None       Portions of this note were generated with dragon dictation software. Dictation errors may occur despite best attempts at proofreading.   Sharman Cheek, MD 03/03/21 [phone]

## 2021-03-03 ENCOUNTER — Inpatient Hospital Stay
Admit: 2021-03-03 | Discharge: 2021-03-03 | Disposition: A | Discharge status: Home / Self Care | Payer: Medicare Other | Attending: Family Medicine | Admitting: Family Medicine

## 2021-03-03 ENCOUNTER — Inpatient Hospital Stay: Payer: Medicare Other

## 2021-03-03 DIAGNOSIS — I514 Myocarditis, unspecified: Secondary | ICD-10-CM | POA: Diagnosis not present

## 2021-03-03 DIAGNOSIS — R778 Other specified abnormalities of plasma proteins: Secondary | ICD-10-CM | POA: Diagnosis present

## 2021-03-03 DIAGNOSIS — R42 Dizziness and giddiness: Secondary | ICD-10-CM | POA: Diagnosis not present

## 2021-03-03 LAB — ECHOCARDIOGRAM COMPLETE
AR max vel: 1.47 cm2
AV Area VTI: 1.42 cm2
AV Area mean vel: 1.34 cm2
AV Mean grad: 4 mmHg
AV Peak grad: 7.4 mmHg
Ao pk vel: 1.36 m/s
Area-P 1/2: 6.9 cm2
Height: 66 in
MV VTI: 1.82 cm2
S' Lateral: 3.4 cm
Weight: 2448 oz

## 2021-03-03 LAB — CBC
HCT: 32 % — ABNORMAL LOW (ref 36.0–46.0)
Hemoglobin: 10.8 g/dL — ABNORMAL LOW (ref 12.0–15.0)
MCH: 31.7 pg (ref 26.0–34.0)
MCHC: 33.8 g/dL (ref 30.0–36.0)
MCV: 93.8 fL (ref 80.0–100.0)
Platelets: 352 10*3/uL (ref 150–400)
RBC: 3.41 MIL/uL — ABNORMAL LOW (ref 3.87–5.11)
RDW: 13.9 % (ref 11.5–15.5)
WBC: 5.8 10*3/uL (ref 4.0–10.5)
nRBC: 0 % (ref 0.0–0.2)

## 2021-03-03 LAB — TYPE AND SCREEN
ABO/RH(D): O POS
Antibody Screen: NEGATIVE
Unit division: 0
Unit division: 0

## 2021-03-03 LAB — BASIC METABOLIC PANEL
Anion gap: 8 (ref 5–15)
BUN: 20 mg/dL (ref 8–23)
CO2: 27 mmol/L (ref 22–32)
Calcium: 8.6 mg/dL — ABNORMAL LOW (ref 8.9–10.3)
Chloride: 105 mmol/L (ref 98–111)
Creatinine, Ser: 0.89 mg/dL (ref 0.44–1.00)
GFR, Estimated: >60 mL/min (ref >60)
Glucose, Bld: 116 mg/dL — ABNORMAL HIGH (ref 70–99)
Potassium: 3.8 mmol/L (ref 3.5–5.1)
Sodium: 140 mmol/L (ref 135–145)

## 2021-03-03 LAB — URINALYSIS, COMPLETE (UACMP) WITH MICROSCOPIC
Bilirubin Urine: NEGATIVE
Glucose, UA: NEGATIVE mg/dL
Hgb urine dipstick: NEGATIVE
Ketones, ur: NEGATIVE mg/dL
Nitrite: NEGATIVE
Protein, ur: 30 mg/dL — AB
Specific Gravity, Urine: 1.027 (ref 1.005–1.030)
pH: 5 (ref 5.0–8.0)

## 2021-03-03 LAB — BPAM RBC
Blood Product Expiration Date: 202208052359
Blood Product Expiration Date: 202208052359
Unit Type and Rh: 5100
Unit Type and Rh: 5100

## 2021-03-03 LAB — D-DIMER, QUANTITATIVE: D-Dimer, Quant: 3.32 ug/mL-FEU — ABNORMAL HIGH (ref 0.00–0.50)

## 2021-03-03 LAB — RESP PANEL BY RT-PCR (FLU A&B, COVID) ARPGX2
Influenza A by PCR: NEGATIVE
Influenza B by PCR: NEGATIVE
SARS Coronavirus 2 by RT PCR: NEGATIVE

## 2021-03-03 LAB — LIPID PANEL
Cholesterol: 154 mg/dL (ref 0–200)
HDL: 27 mg/dL — ABNORMAL LOW (ref >40)
LDL Cholesterol: 111 mg/dL — ABNORMAL HIGH (ref 0–99)
Total CHOL/HDL Ratio: 5.7 RATIO
Triglycerides: 82 mg/dL (ref <150)
VLDL: 16 mg/dL (ref 0–40)

## 2021-03-03 LAB — PROTIME-INR
INR: 1.2 (ref 0.8–1.2)
INR: 1.2 (ref 0.8–1.2)
Prothrombin Time: 14.9 seconds (ref 11.4–15.2)
Prothrombin Time: 15 seconds (ref 11.4–15.2)

## 2021-03-03 LAB — APTT: aPTT: 77 seconds — ABNORMAL HIGH (ref 24–36)

## 2021-03-03 LAB — PREPARE RBC (CROSSMATCH)

## 2021-03-03 LAB — MAGNESIUM: Magnesium: 1.8 mg/dL (ref 1.7–2.4)

## 2021-03-03 MED ORDER — ASPIRIN 81 MG PO CHEW: 324.0000 mg | CHEWABLE_TABLET | Freq: Once | ORAL | Status: DC

## 2021-03-03 MED ORDER — SACUBITRIL-VALSARTAN 24-26 MG PO TABS
1.0000 | ORAL_TABLET | Freq: Every day | ORAL | Status: DC
  Administered 2021-03-03 – 2021-03-04 (×2): 1 via ORAL
  Filled 2021-03-03 (×2): qty 1

## 2021-03-03 MED ORDER — VITAMIN B-12 1000 MCG PO TABS
1000.0000 ug | ORAL_TABLET | Freq: Every day | ORAL | Status: DC
  Administered 2021-03-03 – 2021-03-04 (×2): 1000 ug via ORAL
  Filled 2021-03-03 (×2): qty 1

## 2021-03-03 MED ORDER — FLUTICASONE PROPIONATE 50 MCG/ACT NA SUSP
2.0000 | Freq: Every day | NASAL | Status: DC
  Administered 2021-03-04: 2 via NASAL
  Filled 2021-03-03: qty 16

## 2021-03-03 MED ORDER — ONDANSETRON HCL 4 MG/2ML IJ SOLN
4.0000 mg | Freq: Four times a day (QID) | INTRAMUSCULAR | Status: DC | PRN
  Administered 2021-03-03 – 2021-03-04 (×3): 4 mg via INTRAVENOUS
  Filled 2021-03-03 (×3): qty 2

## 2021-03-03 MED ORDER — POTASSIUM CHLORIDE 20 MEQ PO PACK
40.0000 meq | PACK | Freq: Once | ORAL | Status: AC
  Administered 2021-03-03: 40 meq via ORAL
  Filled 2021-03-03: qty 2

## 2021-03-03 MED ORDER — ALBUTEROL SULFATE (2.5 MG/3ML) 0.083% IN NEBU: 2.5000 mg | INHALATION_SOLUTION | Freq: Four times a day (QID) | RESPIRATORY_TRACT | Status: DC | PRN

## 2021-03-03 MED ORDER — SODIUM CHLORIDE 0.9 % IV SOLN: INTRAVENOUS | Status: DC

## 2021-03-03 MED ORDER — NITROGLYCERIN 0.4 MG SL SUBL: 0.4000 mg | SUBLINGUAL_TABLET | SUBLINGUAL | Status: DC | PRN

## 2021-03-03 MED ORDER — ISOSORBIDE MONONITRATE ER 60 MG PO TB24
30.0000 mg | ORAL_TABLET | Freq: Every day | ORAL | Status: DC
  Administered 2021-03-04: 30 mg via ORAL
  Filled 2021-03-03: qty 1

## 2021-03-03 MED ORDER — ASPIRIN 300 MG RE SUPP: 300.0000 mg | RECTAL | Status: AC

## 2021-03-03 MED ORDER — ASPIRIN 81 MG PO TBEC: 81.0000 mg | DELAYED_RELEASE_TABLET | Freq: Every day | ORAL | Status: DC

## 2021-03-03 MED ORDER — METOPROLOL SUCCINATE ER 50 MG PO TB24
25.0000 mg | ORAL_TABLET | Freq: Every day | ORAL | Status: DC
  Administered 2021-03-04: 25 mg via ORAL
  Filled 2021-03-03: qty 1

## 2021-03-03 MED ORDER — FUROSEMIDE 40 MG PO TABS
40.0000 mg | ORAL_TABLET | Freq: Every day | ORAL | Status: DC
  Administered 2021-03-03: 40 mg via ORAL
  Filled 2021-03-03: qty 1

## 2021-03-03 MED ORDER — VITAMIN D (ERGOCALCIFEROL) 1.25 MG (50000 UNIT) PO CAPS: 50000.0000 [IU] | ORAL_CAPSULE | ORAL | Status: DC

## 2021-03-03 MED ORDER — ZOLPIDEM TARTRATE 5 MG PO TABS: 5.0000 mg | ORAL_TABLET | Freq: Every evening | ORAL | Status: DC | PRN

## 2021-03-03 MED ORDER — MAGNESIUM HYDROXIDE 400 MG/5ML PO SUSP
30.0000 mL | Freq: Every day | ORAL | Status: DC | PRN
  Administered 2021-03-03: 30 mL via ORAL
  Filled 2021-03-03: qty 30

## 2021-03-03 MED ORDER — ASPIRIN 81 MG PO CHEW
324.0000 mg | CHEWABLE_TABLET | ORAL | Status: AC
  Administered 2021-03-03: 324 mg via ORAL
  Filled 2021-03-03: qty 4

## 2021-03-03 MED ORDER — ATORVASTATIN CALCIUM 80 MG PO TABS
80.0000 mg | ORAL_TABLET | Freq: Every day | ORAL | Status: DC
  Administered 2021-03-03 – 2021-03-04 (×2): 80 mg via ORAL
  Filled 2021-03-03 (×2): qty 1

## 2021-03-03 MED ORDER — SPIRONOLACTONE 25 MG PO TABS
12.5000 mg | ORAL_TABLET | Freq: Every day | ORAL | Status: DC
  Administered 2021-03-04: 12.5 mg via ORAL
  Filled 2021-03-03: qty 0.5
  Filled 2021-03-03: qty 1

## 2021-03-03 MED ORDER — ACETAMINOPHEN 325 MG PO TABS
650.0000 mg | ORAL_TABLET | ORAL | Status: DC | PRN
  Administered 2021-03-03: 650 mg via ORAL
  Filled 2021-03-03: qty 2

## 2021-03-03 MED ORDER — ASPIRIN EC 81 MG PO TBEC
81.0000 mg | DELAYED_RELEASE_TABLET | Freq: Every day | ORAL | Status: DC
  Administered 2021-03-04: 81 mg via ORAL
  Filled 2021-03-03: qty 1

## 2021-03-03 MED ORDER — ALPRAZOLAM 0.25 MG PO TABS: 0.2500 mg | ORAL_TABLET | Freq: Two times a day (BID) | ORAL | Status: DC | PRN

## 2021-03-03 MED ORDER — ALBUTEROL SULFATE HFA 108 (90 BASE) MCG/ACT IN AERS: 1.0000 | INHALATION_SPRAY | Freq: Four times a day (QID) | RESPIRATORY_TRACT | Status: DC | PRN

## 2021-03-03 MED ORDER — HYDRALAZINE HCL 50 MG PO TABS
25.0000 mg | ORAL_TABLET | Freq: Three times a day (TID) | ORAL | Status: DC
  Administered 2021-03-03 – 2021-03-04 (×3): 25 mg via ORAL
  Filled 2021-03-03 (×4): qty 1

## 2021-03-03 NOTE — H&P (Addendum)
[facility]   PATIENT NAME: Tina Fields    MR#:  [id number]  DATE OF BIRTH:  1946/07/14  DATE OF ADMISSION:  03/02/2021  PRIMARY CARE PHYSICIAN: Oswaldo Conroy, MD   Patient is coming from: Home  REQUESTING/REFERRING PHYSICIAN: Alfonse Flavors, MD  CHIEF COMPLAINT:   Chief Complaint  Patient presents with   Hypotension    HISTORY OF PRESENT ILLNESS:  Tina Fields is a 75 y.o. African-American female with medical history significant for asthma, hypertension, rheumatoid arthritis and GERD, who presented to the emergency room with a [place] of generalized malaise and lightheadedness as well as presyncope followed by vomiting without bloody or bilious vomitus.  She denies any chest pain or dyspnea or cough or wheezing or hemoptysis.  No abdominal pain or melena or bright red bleeding per rectum.  No dysuria, oliguria or hematuria or flank pain.  She came back Tuesday from a trip to [place].  ED Course: Upon presentation to the emergency room, blood pressure was 95/60, temperature was 96.8 with otherwise normal vital signs.  Labs revealed high-sensitivity troponin I of 439 and later 576 and mild anemia with hemoglobin of 11.3 and hematocrit 34.5 with low iron of 24 and TIBC was 217.  Ferritin was 145.  CMP was remarkable for mild hypokalemia 3.4. EKG as reviewed by me : Currently is pending. Imaging: Portable chest ray showed no acute cardiopulmonary disease.  The patient was given 4 baby aspirin as well as 1 L bolus of IV normal saline and IV heparin bolus and infusion.  She will be admitted to a progressive unit bed for further evaluation and management. PAST MEDICAL HISTORY:   Past Medical History:  Diagnosis Date   Acid reflux    Asthma    Collagen vascular disease (HCC)    RA   Edema of both legs    Hypertension    RA (rheumatoid arthritis) (HCC)     PAST SURGICAL HISTORY:   Past Surgical History:  Procedure Laterality Date   TUBAL LIGATION       SOCIAL HISTORY:   Social History   Tobacco Use   Smoking status: Former    Pack years: 0.00    Types: Cigarettes    Quit date: 01/09/1995    Years since quitting: 26.1   Smokeless tobacco: Never  Substance Use Topics   Alcohol use: No    FAMILY HISTORY:   Family History  Problem Relation Age of Onset   CAD Mother    Diabetes Mother    Hypertension Mother    CAD Father    Diabetes Father    Hypertension Father    Stroke Father     DRUG ALLERGIES:   Allergies  Allergen Reactions   Azithromycin Rash   Latex Hives    REVIEW OF SYSTEMS:   ROS As per history of present illness. All pertinent systems were reviewed above. Constitutional, HEENT, cardiovascular, respiratory, GI, GU, musculoskeletal, neuro, psychiatric, endocrine, integumentary and hematologic systems were reviewed and are otherwise negative/unremarkable except for positive findings mentioned above in the HPI.   MEDICATIONS AT HOME:   Prior to Admission medications   Medication Sig Start Date End Date Taking? Authorizing Provider  albuterol (VENTOLIN HFA) 108 (90 Base) MCG/ACT inhaler Inhale 1-2 puffs into the lungs every 6 (six) hours as needed. 11/06/20   [provider]  aspirin EC 81 MG EC tablet Take 1 tablet (81 mg total) by mouth daily. Swallow whole. 01/06/21   Rai,  Ripudeep K, MD  budesonide-formoterol (SYMBICORT) 80-4.5 MCG/ACT inhaler Inhale 2 puffs into the lungs 2 (two) times daily as needed. 12/03/18   [provider]  [employer] VITAMIN B12 1000 MCG tablet Take 1,000 mcg by mouth daily. 08/04/20   [provider]  ENTRESTO 24-26 MG Take 1 tablet by mouth daily. 11/02/20   [provider]  fluticasone (FLONASE) 50 MCG/ACT nasal spray Place 2 sprays into the nose daily. 12/03/18   [provider]  furosemide (LASIX) 40 MG tablet Take 1 tablet (40 mg total) by mouth daily. 01/06/21   Rai, Delene Ruffini, MD  hydrALAZINE (APRESOLINE) 25 MG tablet Take 1 tablet (25 mg  total) by mouth 3 (three) times daily. 01/05/21   Rai, Delene Ruffini, MD  isosorbide mononitrate (IMDUR) 30 MG 24 hr tablet Take 1 tablet (30 mg total) by mouth daily. 01/06/21   Rai, Delene Ruffini, MD  metoprolol succinate (TOPROL-XL) 25 MG 24 hr tablet Take 25 mg by mouth daily.    [provider]  spironolactone (ALDACTONE) 25 MG tablet Take 12.5 mg by mouth daily. 10/29/20   [provider]  Vitamin D, Ergocalciferol, (DRISDOL) 1.25 MG (50000 UNIT) CAPS capsule Take 50,000 Units by mouth every 7 (seven) days. 09/11/20   [provider]      VITAL SIGNS:  Blood pressure 110/66, pulse 71, temperature (!) 96.8 F (36 C), temperature source Axillary, resp. rate (!) 33, height 5\' 6"  (1.676 m), weight 69.4 kg, SpO2 97 %.  PHYSICAL EXAMINATION:  Physical Exam  GENERAL:  75 y.o.-year-old African-American female patient lying in the bed with no acute distress.  EYES: Pupils equal, round, reactive to light and accommodation. No scleral icterus. Extraocular muscles intact.  HEENT: Head atraumatic, normocephalic. Oropharynx and nasopharynx clear.  NECK:  Supple, no jugular venous distention. No thyroid enlargement, no tenderness.  LUNGS: Normal breath sounds bilaterally, no wheezing, rales,rhonchi or crepitation. No use of accessory muscles of respiration.  CARDIOVASCULAR: Regular rate and rhythm, S1, S2 normal. No murmurs, rubs, or gallops.  ABDOMEN: Soft, nondistended, nontender. Bowel sounds present. No organomegaly or mass.  EXTREMITIES: No pedal edema, cyanosis, or clubbing.  NEUROLOGIC: Cranial nerves II through XII are intact. Muscle strength 5/5 in all extremities. Sensation intact. Gait not checked.  PSYCHIATRIC: The patient is alert and oriented x 3.  Normal affect and good eye contact. SKIN: No obvious rash, lesion, or ulcer.   LABORATORY PANEL:   CBC Recent Labs  Lab 03/02/21 2157  WBC 5.9  HGB 11.3*  HCT 34.5*  PLT 353    ------------------------------------------------------------------------------------------------------------------  Chemistries  Recent Labs  Lab 03/02/21 2037  NA 138  K 3.4*  CL 103  CO2 24  GLUCOSE 92  BUN 17  CREATININE 1.05*  CALCIUM 8.5*  AST 14*  ALT 7  ALKPHOS 78  BILITOT 0.7   ------------------------------------------------------------------------------------------------------------------  Cardiac Enzymes No results for input(s): TROPONINI in the last 168 hours. ------------------------------------------------------------------------------------------------------------------  RADIOLOGY:  2038 Venous Img Lower Unilateral Left  Result Date: 03/02/2021 CLINICAL DATA:  Pain, swelling EXAM: LEFT LOWER EXTREMITY VENOUS DOPPLER ULTRASOUND TECHNIQUE: Gray-scale sonography with compression, as well as color and duplex ultrasound, were performed to evaluate the deep venous system(s) from the level of the common femoral vein through the popliteal and proximal calf veins. COMPARISON:  None. FINDINGS: VENOUS Normal compressibility of the common femoral, superficial femoral, and popliteal veins, as well as the visualized calf veins. Visualized portions of profunda femoral vein and great saphenous vein unremarkable. No filling  defects to suggest DVT on grayscale or color Doppler imaging. Doppler waveforms show normal direction of venous flow, normal respiratory plasticity and response to augmentation. Limited views of the contralateral common femoral vein are unremarkable. OTHER Popliteal cyst measures 11.5 x 1.6 x 1.4 cm Limitations: none IMPRESSION: No evidence of left lower extremity DVT. Baker's cyst. Electronically Signed   By: Charlett Nose M.D.   On: 03/02/2021 22:46   DG Chest Portable 1 View  Result Date: 03/02/2021 CLINICAL DATA:  75 year old female with near syncope. EXAM: PORTABLE CHEST 1 VIEW COMPARISON:  Chest radiograph dated 01/02/2021. FINDINGS: There is mild if  interstitial coarsening and bibasilar atelectasis/scarring. No focal consolidation, pleural effusion, or pneumothorax. Mild cardiomegaly. Atherosclerotic calcification of the aorta. No acute osseous pathology. Degenerative changes of the spine. IMPRESSION: No acute cardiopulmonary process. Electronically Signed   By: Elgie Collard M.D.   On: 03/02/2021 20:21      IMPRESSION AND PLAN:  Active Problems:   NSTEMI (non-ST elevated myocardial infarction) (HCC)  1.  Presyncope and mild hypotension with significant elevated troponin I concerning for non-STEMI. - The patient will be admitted to a progressive unit bed. - We will follow serial troponin I's. - We will continue aspirin at 325 mg.. - We will place her on high-dose statin therapy as well as continue beta-blocker therapy. - We added D-dimer and is currently pending. - We will check orthostatics and monitor for arrhythmias. - Cardiology consult and 2D Tina will be obtained. - I wrote for Dr. Juliann Pares with about the patient.    2.  Chronic systolic and diastolic CHF without exacerbation. - We will continue her Aldactone, Entresto Toprol-XL and Lasix with holding parameters given initial hypotension.  3.   Asthma and COPD. - We will continue as needed albuterol and stop Symbicort to avoid long-acting beta agonist in the setting of non-STEMI.  4. Mild hypokalemia. - Potassium will be replaced and magnesium level will be checked.  DVT prophylaxis: IV heparin. Code Status: full code. Family Communication:  The plan of care was discussed in details with the patient (and family). I answered all questions. The patient agreed to proceed with the above mentioned plan. Further management will depend upon hospital course. Disposition Plan: Back to previous home environment Consults called: Cardiology consult. All the records are reviewed and case discussed with ED provider.  Status is: Inpatient  Remains inpatient appropriate  because:Ongoing diagnostic testing needed not appropriate for outpatient work up, Unsafe d/c plan, IV treatments appropriate due to intensity of illness or inability to take PO, and Inpatient level of care appropriate due to severity of illness  Dispo: The patient is from: Home              Anticipated d/c is to: Home              Patient currently is not medically stable to d/c.   Difficult to place patient No   TOTAL TIME TAKING CARE OF THIS PATIENT: 55 minutes.    Hannah Beat M.D on 03/03/2021 at 12:44 AM  [facility]   From 7 PM-7 AM, contact night-coverage [email or web address]  CC: Primary care physician; Oswaldo Conroy, MD

## 2021-03-03 NOTE — Progress Notes (Signed)
*  PRELIMINARY RESULTS* Echocardiogram 2D Echocardiogram has been performed.  Tina Fields 03/03/2021, 9:10 AM

## 2021-03-03 NOTE — ED Notes (Signed)
Pt ao x 4, NAD. Denies any complaints at this time. States "Im ready to take a shower, go to sleep and hopefully go home later today".

## 2021-03-03 NOTE — Care Management Important Message (Signed)
Important Message  Patient Details  Name: Tina Fields MRN: [id number] Date of Birth: [birth year]   Medicare Important Message Given:  N/A - LOS <3 / Initial given by admissions  Initial Medicare IM reviewed by Tina Fields, Patient Access Associate on 03/03/2021 at 12:29am.   Johnell Comings 03/03/2021, 1:34 PM

## 2021-03-03 NOTE — Consult Note (Signed)
Cardiology Consultation Note    Patient ID: Tina Fields, MRN: [id number], DOB/AGE: 05/29/1946 75 y.o. Admit date: 03/02/2021   Date of Consult: 03/03/2021 Primary Physician: Oswaldo Conroy, MD Primary Cardiologist: Dr. Juliann Pares  Chief Complaint: nausea and vomiting Reason for Consultation: abnormal troponin Requesting MD: Dr. Chipper Herb  HPI: Tina Fields is a 75 y.o. female with history of no cardiac problems but does have hypertension who presented to emergency room after having malaise nausea and vomiting.  She presented emergency room with no chest pain.  EKG was unremarkable.  She had no abdominal pain.  Per ER protocol, troponin was drawn which showed elevated serum troponin with levels at 439 and 576.  Patient still has no chest pain.  She does complain of some epigastric discomfort which occurred after her vomiting.  She is hemodynamically stable.  She was placed on heparin.  She does not appear to be a candidate for any ischemic work-up at present as her echocardiogram showed preserved LV function with no regional wall motion abnormalities.  Past Medical History:  Diagnosis Date   Acid reflux    Asthma    Collagen vascular disease (HCC)    RA   Edema of both legs    Hypertension    RA (rheumatoid arthritis) (HCC)       Surgical History:  Past Surgical History:  Procedure Laterality Date   TUBAL LIGATION       Home Meds: Prior to Admission medications   Medication Sig Start Date End Date Taking? Authorizing Provider  albuterol (PROVENTIL) (2.5 MG/3ML) 0.083% nebulizer solution Take 2.5 mg by nebulization every 6 (six) hours as needed for wheezing.   Yes [provider]  albuterol (VENTOLIN HFA) 108 (90 Base) MCG/ACT inhaler Inhale 1-2 puffs into the lungs every 6 (six) hours as needed. 11/06/20  Yes [provider]  aspirin EC 81 MG EC tablet Take 1 tablet (81 mg total) by mouth daily. Swallow whole. 01/06/21  Yes Rai, Ripudeep K, MD   budesonide-formoterol (SYMBICORT) 80-4.5 MCG/ACT inhaler Inhale 2 puffs into the lungs 2 (two) times daily as needed. 12/03/18  Yes [provider]  [employer] VITAMIN B12 1000 MCG tablet Take 1,000 mcg by mouth daily. 08/04/20  Yes [provider]  cyclobenzaprine (FLEXERIL) 5 MG tablet Take 5 mg by mouth See admin instructions. Take 5 mg by mouth once daily at bedtime as needed for muscle pain   Yes [provider]  ENTRESTO 24-26 MG Take 1 tablet by mouth daily. 11/02/20  Yes [provider]  fluticasone (FLONASE) 50 MCG/ACT nasal spray Place 2 sprays into the nose daily. 12/03/18  Yes [provider]  furosemide (LASIX) 40 MG tablet Take 1 tablet (40 mg total) by mouth daily. 01/06/21  Yes Rai, Ripudeep K, MD  hydrALAZINE (APRESOLINE) 25 MG tablet Take 1 tablet (25 mg total) by mouth 3 (three) times daily. 01/05/21  Yes Rai, Ripudeep K, MD  isosorbide mononitrate (IMDUR) 30 MG 24 hr tablet Take 1 tablet (30 mg total) by mouth daily. 01/06/21  Yes Rai, Ripudeep K, MD  meclizine (ANTIVERT) 25 MG tablet Take 25 mg by mouth 3 (three) times daily as needed for dizziness.   Yes [provider]  metoprolol succinate (TOPROL-XL) 25 MG 24 hr tablet Take 25 mg by mouth daily.   Yes [provider]  MIRALAX 17 GM/SCOOP powder Take 17 g by mouth See admin instructions. Dissolve 17 gm in 8 ounces of water and drink  once or twice daily. 01/11/21  Yes [provider]  montelukast (SINGULAIR) 10 MG tablet Take 10 mg by mouth at bedtime.   Yes [provider]  olmesartan (BENICAR) 40 MG tablet Take 40 mg by mouth daily.   Yes [provider]  spironolactone (ALDACTONE) 25 MG tablet Take 12.5 mg by mouth daily. 10/29/20  Yes [provider]  Vitamin D, Ergocalciferol, (DRISDOL) 1.25 MG (50000 UNIT) CAPS capsule Take 50,000 Units by mouth every 7 (seven) days. 09/11/20  Yes [provider]  meloxicam (MOBIC) 15 MG tablet Take 15  mg by mouth daily. Patient not taking: No sig reported    [provider]    Inpatient Medications:   [START ON 03/04/2021] aspirin EC  81 mg Oral Daily   atorvastatin  80 mg Oral Daily   fluticasone  2 spray Each Nare Daily   furosemide  40 mg Oral Daily   hydrALAZINE  25 mg Oral TID   [START ON 03/04/2021] isosorbide mononitrate  30 mg Oral Daily   [START ON 03/04/2021] metoprolol succinate  25 mg Oral Daily   sacubitril-valsartan  1 tablet Oral Daily   [START ON 03/04/2021] spironolactone  12.5 mg Oral Daily   cyanocobalamin  1,000 mcg Oral Daily   [START ON 03/06/2021] Vitamin D (Ergocalciferol)  50,000 Units Oral Q7 days    sodium chloride Stopped (03/02/21 2145)   sodium chloride Stopped (03/03/21 0350)    Allergies:  Allergies  Allergen Reactions   Ace Inhibitors Cough    Other reaction(s): Cough Other reaction(s): Unknown    Furosemide Other (See Comments)    Other reaction(s): Unknown    Hydrochlorothiazide Other (See Comments)   Latex Hives   Rofecoxib Other (See Comments)    Other reaction(s): Unknown    Azithromycin Rash    Social History   Socioeconomic History   Marital status: Widowed    Spouse name: Not on file   Number of children: Not on file   Years of education: Not on file   Highest education level: Not on file  Occupational History   Not on file  Tobacco Use   Smoking status: Former    Pack years: 0.00    Types: Cigarettes    Quit date: 01/09/1995    Years since quitting: 26.1   Smokeless tobacco: Never  Vaping Use   Vaping Use: Never used  Substance and Sexual Activity   Alcohol use: No   Drug use: No   Sexual activity: Yes    Birth control/protection: None  Other Topics Concern   Not on file  Social History Narrative   Not on file   Social Determinants of Health   Financial Resource Strain: Not on file  Food Insecurity: Not on file  Transportation Needs: Not on file  Physical Activity: Not on file  Stress: Not on file   Social Connections: Not on file  Intimate Partner Violence: Not on file     Family History  Problem Relation Age of Onset   CAD Mother    Diabetes Mother    Hypertension Mother    CAD Father    Diabetes Father    Hypertension Father    Stroke Father      Review of Systems: A 12-system review of systems was performed and is negative except as noted in the HPI.  Labs: No results for input(s): CKTOTAL, CKMB, TROPONINI in the last 72 hours. Lab Results  Component Value Date   WBC 5.8 03/03/2021  HGB 10.8 (L) 03/03/2021   HCT 32.0 (L) 03/03/2021   MCV 93.8 03/03/2021   PLT 352 03/03/2021    Recent Labs  Lab 03/02/21 2037 03/03/21 0642  NA 138 140  K 3.4* 3.8  CL 103 105  CO2 24 27  BUN 17 20  CREATININE 1.05* 0.89  CALCIUM 8.5* 8.6*  PROT 7.2  --   BILITOT 0.7  --   ALKPHOS 78  --   ALT 7  --   AST 14*  --   GLUCOSE 92 116*   Lab Results  Component Value Date   CHOL 154 03/03/2021   HDL 27 (L) 03/03/2021   LDLCALC 111 (H) 03/03/2021   TRIG 82 03/03/2021   Lab Results  Component Value Date   DDIMER 3.32 (H) 03/03/2021    Radiology/Studies:  US Venous Img Lower Unilateral Left  Result Date: 03/02/2021 CLINICAL DATA:  Pain, swelling EXAM: LEFT LOWER EXTREMITY VENOUS DOPPLER ULTRASOUND TECHNIQUE: Gray-scale sonography with compression, as well as color and duplex ultrasound, were performed to evaluate the deep venous system(s) from the level of the common femoral vein through the popliteal and proximal calf veins. COMPARISON:  None. FINDINGS: VENOUS Normal compressibility of the common femoral, superficial femoral, and popliteal veins, as well as the visualized calf veins. Visualized portions of profunda femoral vein and great saphenous vein unremarkable. No filling defects to suggest DVT on grayscale or color Doppler imaging. Doppler waveforms show normal direction of venous flow, normal respiratory plasticity and response to augmentation. Limited views of the  contralateral common femoral vein are unremarkable. OTHER Popliteal cyst measures 11.5 x 1.6 x 1.4 cm Limitations: none IMPRESSION: No evidence of left lower extremity DVT. Baker's cyst. Electronically Signed   By: Charlett Nose M.D.   On: 03/02/2021 22:46   DG Chest Portable 1 View  Result Date: 03/02/2021 CLINICAL DATA:  75 year old female with near syncope. EXAM: PORTABLE CHEST 1 VIEW COMPARISON:  Chest radiograph dated 01/02/2021. FINDINGS: There is mild if interstitial coarsening and bibasilar atelectasis/scarring. No focal consolidation, pleural effusion, or pneumothorax. Mild cardiomegaly. Atherosclerotic calcification of the aorta. No acute osseous pathology. Degenerative changes of the spine. IMPRESSION: No acute cardiopulmonary process. Electronically Signed   By: Elgie Collard M.D.   On: 03/02/2021 20:21   ECHOCARDIOGRAM COMPLETE  Result Date: 03/03/2021    ECHOCARDIOGRAM REPORT   Patient Name:   Tina Fields Date of Exam: 03/03/2021 Medical Rec #:  [id number]             Height:       66.0 in Accession #:    [id number]            Weight:       153.0 lb Date of Birth:  1946/01/01              BSA:          1.785 m Patient Age:    75 years              BP:           119/69 mmHg Patient Gender: F                     HR:           72 bpm. Exam Location:  [facility] Procedure: 2D Echo, Color Doppler, Cardiac Doppler and Strain Analysis Indications:     I21.4 NSTEMI  History:         Patient has no  prior history of Echocardiogram examinations.                  Signs/Symptoms:Hypotension.  Sonographer:     Humphrey Rolls RDCS (AE) Referring Phys:  [id number] Vernetta Honey MANSY Diagnosing Phys: Harold Hedge MD  Sonographer Comments: Global longitudinal strain was attempted. IMPRESSIONS  1. Left ventricular ejection fraction, by estimation, is 60 to 65%. The left ventricle has normal function. The left ventricle has no regional wall motion abnormalities. There is moderate left ventricular hypertrophy. Left  ventricular diastolic parameters are consistent with Grade I diastolic dysfunction (impaired relaxation).  2. Right ventricular systolic function is normal. The right ventricular size is normal.  3. Left atrial size was mildly dilated.  4. The mitral valve is grossly normal. Trivial mitral valve regurgitation.  5. The aortic valve is tricuspid. Aortic valve regurgitation is trivial. FINDINGS  Left Ventricle: Left ventricular ejection fraction, by estimation, is 60 to 65%. The left ventricle has normal function. The left ventricle has no regional wall motion abnormalities. The left ventricular internal cavity size was normal in size. There is  moderate left ventricular hypertrophy. Left ventricular diastolic parameters are consistent with Grade I diastolic dysfunction (impaired relaxation). Right Ventricle: The right ventricular size is normal. No increase in right ventricular wall thickness. Right ventricular systolic function is normal. Left Atrium: Left atrial size was mildly dilated. Right Atrium: Right atrial size was normal in size. Pericardium: There is no evidence of pericardial effusion. Mitral Valve: The mitral valve is grossly normal. Trivial mitral valve regurgitation. MV peak gradient, 5.3 mmHg. The mean mitral valve gradient is 2.0 mmHg. Tricuspid Valve: The tricuspid valve is grossly normal. Tricuspid valve regurgitation is mild. Aortic Valve: The aortic valve is tricuspid. Aortic valve regurgitation is trivial. Aortic valve mean gradient measures 4.0 mmHg. Aortic valve peak gradient measures 7.4 mmHg. Aortic valve area, by VTI measures 1.42 cm. Pulmonic Valve: The pulmonic valve was grossly normal. Pulmonic valve regurgitation is not visualized. Aorta: The aortic root is normal in size and structure. IAS/Shunts: The atrial septum is grossly normal.  LEFT VENTRICLE PLAX 2D LVIDd:         5.40 cm  Diastology LVIDs:         3.40 cm  LV e' medial:    3.70 cm/s LV PW:         1.00 cm  LV E/e' medial:   11.2 LV IVS:        0.80 cm  LV e' lateral:   5.98 cm/s LVOT diam:     1.80 cm  LV E/e' lateral: 7.0 LV SV:         41 LV SV Index:   23 LVOT Area:     2.54 cm  RIGHT VENTRICLE RV Basal diam:  2.50 cm LEFT ATRIUM             Index       RIGHT ATRIUM           Index LA diam:        4.40 cm 2.47 cm/m  RA Area:     14.30 cm LA Vol (A2C):   73.2 ml 41.02 ml/m RA Volume:   39.00 ml  21.85 ml/m LA Vol (A4C):   55.3 ml 30.99 ml/m LA Biplane Vol: 64.6 ml 36.20 ml/m  AORTIC VALVE                   PULMONIC VALVE AV Area (Vmax):    1.47 cm  PV Vmax:       0.99 m/s AV Area (Vmean):   1.34 cm    PV Vmean:      68.500 cm/s AV Area (VTI):     1.42 cm    PV VTI:        0.216 m AV Vmax:           136.00 cm/s PV Peak grad:  3.9 mmHg AV Vmean:          93.300 cm/s PV Mean grad:  2.0 mmHg AV VTI:            0.286 m AV Peak Grad:      7.4 mmHg AV Mean Grad:      4.0 mmHg LVOT Vmax:         78.30 cm/s LVOT Vmean:        49.300 cm/s LVOT VTI:          0.160 m LVOT/AV VTI ratio: 0.56  AORTA Ao Root diam: 3.00 cm MITRAL VALVE               TRICUSPID VALVE MV Area (PHT): 6.90 cm    TR Peak grad:   39.7 mmHg MV Area VTI:   1.82 cm    TR Vmax:        315.00 cm/s MV Peak grad:  5.3 mmHg MV Mean grad:  2.0 mmHg    SHUNTS MV Vmax:       1.15 m/s    Systemic VTI:  0.16 m MV Vmean:      60.8 cm/s   Systemic Diam: 1.80 cm MV Decel Time: 110 msec MV E velocity: 41.60 cm/s MV A velocity: 90.80 cm/s MV E/A ratio:  0.46 Harold Hedge MD Electronically signed by Harold Hedge MD Signature Date/Time: 03/03/2021/10:23:14 AM    Final     Wt Readings from Last 3 Encounters:  03/03/21 70.1 kg  01/18/21 73.5 kg  01/05/21 75.7 kg    EKG: Normal sinus rhythm with no ischemia  Physical Exam:  Blood pressure 114/74, pulse 68, temperature 97.8 F (36.6 C), temperature source Oral, resp. rate 19, height 5\' 6"  (1.676 m), weight 70.1 kg, SpO2 100 %. Body mass index is 24.94 kg/m. General: Well developed, well nourished, in no acute  distress. Head: Normocephalic, atraumatic, sclera non-icteric, no xanthomas, nares are without discharge.  Neck: Negative for carotid bruits. JVD not elevated. Lungs: Clear bilaterally to auscultation without wheezes, rales, or rhonchi. Breathing is unlabored. Heart: RRR with S1 S2. No murmurs, rubs, or gallops appreciated. Abdomen: Soft, non-tender, non-distended with normoactive bowel sounds. No hepatomegaly. No rebound/guarding. No obvious abdominal masses. Msk:  Strength and tone appear normal for age. Extremities: No clubbing or cyanosis. No edema.  Distal pedal pulses are 2+ and equal bilaterally. Neuro: Alert and oriented X 3. No facial asymmetry. No focal deficit. Moves all extremities spontaneously. Psych:  Responds to questions appropriately with a normal affect.     Assessment and Plan  75 year old female with no prior cardiac history presented with nausea and vomiting.  EKG was unremarkable.  Patient denied chest pain.  Serum troponins were drawn in the ER which were elevated at in the mid 400s to low 500s but flat.  Again she had no chest pain and no EKG ischemic changes.  She has some epigastric discomfort but no upper chest pain.  1.  Abnormal troponin-appears to be likely strain secondary to her nausea and vomiting as well as recent myocardial irritation due to COVID-19.  We will stop heparin.  We will continue with current regimen including metoprolol to succinate 25 mg daily, isosorbide mononitrate 30 mg daily, hydralazine 25 mg 3 times daily, furosemide 40 daily, atorvastatin 80 daily, sacubitril-valsartan 24-26 daily.  We will also continue with spironolactone.  Will give a diet today and follow.  The patient remained stable, consideration for discharge in the morning.  If there are further symptoms or clinical evidence of ischemic changes further evaluation may be warranted.  2.  Hypertension-continue on current regimen.  3.  Nausea and vomiting-is  improving.  Signed, Dalia Heading MD 03/03/2021, 12:31 PM Pager: [phone]

## 2021-03-03 NOTE — Progress Notes (Signed)
PROGRESS NOTE    Tina Fields  AJO:[id number] DOB: 08/27/1946 DOA: 03/02/2021 PCP: Oswaldo Conroy, MD   Chief complaint.  Nausea vomiting. Brief Narrative:  Tina Fields is a 75 y.o. African-American female with medical history significant for asthma, hypertension, rheumatoid arthritis and GERD, who presented to the emergency room with nausea vomiting. Patient had 2 episodes of COVID, with the most recent infection  1 month ago.  Patient had a diarrhea on Sunday, she had a poor appetite since then.  She started have nausea vomiting on Wednesday.  She at bedtime self check her blood pressure, was a pretty low.  When she arrived in the hospital, she was a found to have elevated troponin at 576.  Cardiology consult is obtained.  She is placed on heparin drip.   Assessment & Plan:   Active Problems:   NSTEMI (non-ST elevated myocardial infarction) (HCC)  #1.  Elevated troponin. Nausea vomiting and recent diarrhea. Hypotension. Consult from cardiology is pending.   Given patient recent COVID, diarrhea and nausea vomiting.  Patient could have acute myocarditis.  Alternatively, patient had a gastroenteritis, elevated troponin was due to hypotension. Will wait for cardiology decision.  2.  Chronic combined systolic and diastolic congestive heart failure. Condition still stable, resumed all home medicines.  3.  COPD. Stable.      DVT prophylaxis: IV heparin Code Status: full Family Communication:  Disposition Plan:    Status is: Inpatient  Remains inpatient appropriate because:Inpatient level of care appropriate due to severity of illness  Dispo: The patient is from: Home              Anticipated d/c is to: Home              Patient currently is not medically stable to d/c.   Difficult to place patient No        I/O last 3 completed shifts: In: 1001.1 [IV Piggyback:1001.1] Out: -  No intake/output data recorded.     Consultants:   Cardiology  Procedures: None  Antimicrobials: None  Subjective: Patient no longer feels nausea vomiting, no dizziness. Denies any short of breath or cough. No chest pain palpitation   Objective: Vitals:   03/03/21 0700 03/03/21 0730 03/03/21 0849 03/03/21 1058  BP: 114/66 119/69 110/68 114/74  Pulse: 73 80 68   Resp: (!) 23 20  19   Temp:  97.9 F (36.6 C) 97.9 F (36.6 C) 97.8 F (36.6 C)  TempSrc:   Oral Oral  SpO2: 95% 96% 100% 100%  Weight:    70.1 kg  Height:        Intake/Output Summary (Last 24 hours) at 03/03/2021 1150 Last data filed at 03/02/2021 2303 Gross per 24 hour  Intake 1001.06 ml  Output --  Net 1001.06 ml   Filed Weights   03/02/21 2005 03/03/21 1058  Weight: 69.4 kg 70.1 kg    Examination:  General exam: Appears calm and comfortable  Respiratory system: Clear to auscultation. Respiratory effort normal. Cardiovascular system: S1 & S2 heard, RRR. No JVD, murmurs, rubs, gallops or clicks. No pedal edema. Gastrointestinal system: Abdomen is nondistended, soft and nontender. No organomegaly or masses felt. Normal bowel sounds heard. Central nervous system: Alert and oriented. No focal neurological deficits. Extremities: Symmetric 5 x 5 power. Skin: No rashes, lesions or ulcers Psychiatry: Judgement and insight appear normal. Mood & affect appropriate.     Data Reviewed: I have personally reviewed following labs and imaging studies  CBC: Recent Labs  Lab 03/02/21 2037 03/02/21 2157 03/03/21 0642  WBC 3.5* 5.9 5.8  NEUTROABS 2.5 4.4  --   HGB 6.6* 11.3* 10.8*  HCT 20.5* 34.5* 32.0*  MCV 97.6 95.6 93.8  PLT 216 353 352   Basic Metabolic Panel: Recent Labs  Lab 03/02/21 2037 03/03/21 0642  NA 138 140  K 3.4* 3.8  CL 103 105  CO2 24 27  GLUCOSE 92 116*  BUN 17 20  CREATININE 1.05* 0.89  CALCIUM 8.5* 8.6*  MG  --  1.8   GFR: Estimated Creatinine Clearance: 49.6 mL/min (by C-G formula based on SCr of 0.89 mg/dL). Liver  Function Tests: Recent Labs  Lab 03/02/21 2037  AST 14*  ALT 7  ALKPHOS 78  BILITOT 0.7  PROT 7.2  ALBUMIN 2.5*   No results for input(s): LIPASE, AMYLASE in the last 168 hours. No results for input(s): AMMONIA in the last 168 hours. Coagulation Profile: Recent Labs  Lab 03/03/21 0642  INR 1.2  1.2   Cardiac Enzymes: No results for input(s): CKTOTAL, CKMB, CKMBINDEX, TROPONINI in the last 168 hours. BNP (last 3 results) No results for input(s): PROBNP in the last 8760 hours. HbA1C: No results for input(s): HGBA1C in the last 72 hours. CBG: No results for input(s): GLUCAP in the last 168 hours. Lipid Profile: Recent Labs    03/03/21 0642  CHOL 154  HDL 27*  LDLCALC 111*  TRIG 82  CHOLHDL 5.7   Thyroid Function Tests: No results for input(s): TSH, T4TOTAL, FREET4, T3FREE, THYROIDAB in the last 72 hours. Anemia Panel: Recent Labs    03/02/21 2157  FERRITIN 145  TIBC 217*  IRON 24*   Sepsis Labs: No results for input(s): PROCALCITON, LATICACIDVEN in the last 168 hours.  No results found for this or any previous visit (from the past 240 hour(s)).       Radiology Studies: US Venous Img Lower Unilateral Left  Result Date: 03/02/2021 CLINICAL DATA:  Pain, swelling EXAM: LEFT LOWER EXTREMITY VENOUS DOPPLER ULTRASOUND TECHNIQUE: Gray-scale sonography with compression, as well as color and duplex ultrasound, were performed to evaluate the deep venous system(s) from the level of the common femoral vein through the popliteal and proximal calf veins. COMPARISON:  None. FINDINGS: VENOUS Normal compressibility of the common femoral, superficial femoral, and popliteal veins, as well as the visualized calf veins. Visualized portions of profunda femoral vein and great saphenous vein unremarkable. No filling defects to suggest DVT on grayscale or color Doppler imaging. Doppler waveforms show normal direction of venous flow, normal respiratory plasticity and response to  augmentation. Limited views of the contralateral common femoral vein are unremarkable. OTHER Popliteal cyst measures 11.5 x 1.6 x 1.4 cm Limitations: none IMPRESSION: No evidence of left lower extremity DVT. Baker's cyst. Electronically Signed   By: Charlett Nose M.D.   On: 03/02/2021 22:46   DG Chest Portable 1 View  Result Date: 03/02/2021 CLINICAL DATA:  75 year old female with near syncope. EXAM: PORTABLE CHEST 1 VIEW COMPARISON:  Chest radiograph dated 01/02/2021. FINDINGS: There is mild if interstitial coarsening and bibasilar atelectasis/scarring. No focal consolidation, pleural effusion, or pneumothorax. Mild cardiomegaly. Atherosclerotic calcification of the aorta. No acute osseous pathology. Degenerative changes of the spine. IMPRESSION: No acute cardiopulmonary process. Electronically Signed   By: Elgie Collard M.D.   On: 03/02/2021 20:21   ECHOCARDIOGRAM COMPLETE  Result Date: 03/03/2021    ECHOCARDIOGRAM REPORT   Patient Name:   Tina Fields Date of Exam: 03/03/2021 Medical Rec #:  [id number]             Height:       66.0 in Accession #:    [id number]            Weight:       153.0 lb Date of Birth:  September 14, 1945              BSA:          1.785 m Patient Age:    75 years              BP:           119/69 mmHg Patient Gender: F                     HR:           72 bpm. Exam Location:  [facility] Procedure: 2D Echo, Color Doppler, Cardiac Doppler and Strain Analysis Indications:     I21.4 NSTEMI  History:         Patient has no prior history of Echocardiogram examinations.                  Signs/Symptoms:Hypotension.  Sonographer:     Humphrey Rolls RDCS (AE) Referring Phys:  [id number] Vernetta Honey MANSY Diagnosing Phys: Harold Hedge MD  Sonographer Comments: Global longitudinal strain was attempted. IMPRESSIONS  1. Left ventricular ejection fraction, by estimation, is 60 to 65%. The left ventricle has normal function. The left ventricle has no regional wall motion abnormalities. There is moderate left  ventricular hypertrophy. Left ventricular diastolic parameters are consistent with Grade I diastolic dysfunction (impaired relaxation).  2. Right ventricular systolic function is normal. The right ventricular size is normal.  3. Left atrial size was mildly dilated.  4. The mitral valve is grossly normal. Trivial mitral valve regurgitation.  5. The aortic valve is tricuspid. Aortic valve regurgitation is trivial. FINDINGS  Left Ventricle: Left ventricular ejection fraction, by estimation, is 60 to 65%. The left ventricle has normal function. The left ventricle has no regional wall motion abnormalities. The left ventricular internal cavity size was normal in size. There is  moderate left ventricular hypertrophy. Left ventricular diastolic parameters are consistent with Grade I diastolic dysfunction (impaired relaxation). Right Ventricle: The right ventricular size is normal. No increase in right ventricular wall thickness. Right ventricular systolic function is normal. Left Atrium: Left atrial size was mildly dilated. Right Atrium: Right atrial size was normal in size. Pericardium: There is no evidence of pericardial effusion. Mitral Valve: The mitral valve is grossly normal. Trivial mitral valve regurgitation. MV peak gradient, 5.3 mmHg. The mean mitral valve gradient is 2.0 mmHg. Tricuspid Valve: The tricuspid valve is grossly normal. Tricuspid valve regurgitation is mild. Aortic Valve: The aortic valve is tricuspid. Aortic valve regurgitation is trivial. Aortic valve mean gradient measures 4.0 mmHg. Aortic valve peak gradient measures 7.4 mmHg. Aortic valve area, by VTI measures 1.42 cm. Pulmonic Valve: The pulmonic valve was grossly normal. Pulmonic valve regurgitation is not visualized. Aorta: The aortic root is normal in size and structure. IAS/Shunts: The atrial septum is grossly normal.  LEFT VENTRICLE PLAX 2D LVIDd:         5.40 cm  Diastology LVIDs:         3.40 cm  LV e' medial:    3.70 cm/s LV PW:          1.00 cm  LV E/e' medial:  11.2 LV IVS:  0.80 cm  LV e' lateral:   5.98 cm/s LVOT diam:     1.80 cm  LV E/e' lateral: 7.0 LV SV:         41 LV SV Index:   23 LVOT Area:     2.54 cm  RIGHT VENTRICLE RV Basal diam:  2.50 cm LEFT ATRIUM             Index       RIGHT ATRIUM           Index LA diam:        4.40 cm 2.47 cm/m  RA Area:     14.30 cm LA Vol (A2C):   73.2 ml 41.02 ml/m RA Volume:   39.00 ml  21.85 ml/m LA Vol (A4C):   55.3 ml 30.99 ml/m LA Biplane Vol: 64.6 ml 36.20 ml/m  AORTIC VALVE                   PULMONIC VALVE AV Area (Vmax):    1.47 cm    PV Vmax:       0.99 m/s AV Area (Vmean):   1.34 cm    PV Vmean:      68.500 cm/s AV Area (VTI):     1.42 cm    PV VTI:        0.216 m AV Vmax:           136.00 cm/s PV Peak grad:  3.9 mmHg AV Vmean:          93.300 cm/s PV Mean grad:  2.0 mmHg AV VTI:            0.286 m AV Peak Grad:      7.4 mmHg AV Mean Grad:      4.0 mmHg LVOT Vmax:         78.30 cm/s LVOT Vmean:        49.300 cm/s LVOT VTI:          0.160 m LVOT/AV VTI ratio: 0.56  AORTA Ao Root diam: 3.00 cm MITRAL VALVE               TRICUSPID VALVE MV Area (PHT): 6.90 cm    TR Peak grad:   39.7 mmHg MV Area VTI:   1.82 cm    TR Vmax:        315.00 cm/s MV Peak grad:  5.3 mmHg MV Mean grad:  2.0 mmHg    SHUNTS MV Vmax:       1.15 m/s    Systemic VTI:  0.16 m MV Vmean:      60.8 cm/s   Systemic Diam: 1.80 cm MV Decel Time: 110 msec MV E velocity: 41.60 cm/s MV A velocity: 90.80 cm/s MV E/A ratio:  0.46 Harold Hedge MD Electronically signed by Harold Hedge MD Signature Date/Time: 03/03/2021/10:23:14 AM    Final         Scheduled Meds:  [START ON 03/04/2021] aspirin EC  81 mg Oral Daily   atorvastatin  80 mg Oral Daily   fluticasone  2 spray Each Nare Daily   furosemide  40 mg Oral Daily   hydrALAZINE  25 mg Oral TID   [START ON 03/04/2021] isosorbide mononitrate  30 mg Oral Daily   [START ON 03/04/2021] metoprolol succinate  25 mg Oral Daily   sacubitril-valsartan  1 tablet Oral Daily    [START ON 03/04/2021] spironolactone  12.5 mg Oral Daily   cyanocobalamin  1,000 mcg Oral Daily   [  START ON 03/06/2021] Vitamin D (Ergocalciferol)  50,000 Units Oral Q7 days   Continuous Infusions:  sodium chloride Stopped (03/02/21 2145)   sodium chloride Stopped (03/03/21 0350)   heparin 900 Units/hr (03/03/21 0422)     LOS: 1 day    Time spent: no charge    Marrion Coy, MD [facility]   To contact the attending provider between 7A-7P or the covering provider during after hours 7P-7A, please log into the web site [email or web address] and access using universal  password for that web site. If you do not have the password, please call the hospital operator.  03/03/2021, 11:50 AM

## 2021-03-04 ENCOUNTER — Observation Stay: Payer: Medicare Other

## 2021-03-04 DIAGNOSIS — K8020 Calculus of gallbladder without cholecystitis without obstruction: Secondary | ICD-10-CM

## 2021-03-04 DIAGNOSIS — E876 Hypokalemia: Secondary | ICD-10-CM | POA: Diagnosis not present

## 2021-03-04 DIAGNOSIS — M069 Rheumatoid arthritis, unspecified: Secondary | ICD-10-CM | POA: Diagnosis not present

## 2021-03-04 DIAGNOSIS — I959 Hypotension, unspecified: Secondary | ICD-10-CM | POA: Diagnosis not present

## 2021-03-04 DIAGNOSIS — U099 Post covid-19 condition, unspecified: Secondary | ICD-10-CM | POA: Diagnosis not present

## 2021-03-04 DIAGNOSIS — Z823 Family history of stroke: Secondary | ICD-10-CM | POA: Diagnosis not present

## 2021-03-04 DIAGNOSIS — Z79899 Other long term (current) drug therapy: Secondary | ICD-10-CM | POA: Diagnosis not present

## 2021-03-04 DIAGNOSIS — R778 Other specified abnormalities of plasma proteins: Secondary | ICD-10-CM | POA: Diagnosis not present

## 2021-03-04 DIAGNOSIS — Z91040 Latex allergy status: Secondary | ICD-10-CM | POA: Diagnosis not present

## 2021-03-04 DIAGNOSIS — I514 Myocarditis, unspecified: Secondary | ICD-10-CM | POA: Diagnosis not present

## 2021-03-04 DIAGNOSIS — Z87891 Personal history of nicotine dependence: Secondary | ICD-10-CM | POA: Diagnosis not present

## 2021-03-04 DIAGNOSIS — I5042 Chronic combined systolic (congestive) and diastolic (congestive) heart failure: Secondary | ICD-10-CM | POA: Diagnosis not present

## 2021-03-04 DIAGNOSIS — Z7982 Long term (current) use of aspirin: Secondary | ICD-10-CM | POA: Diagnosis not present

## 2021-03-04 DIAGNOSIS — Z7951 Long term (current) use of inhaled steroids: Secondary | ICD-10-CM | POA: Diagnosis not present

## 2021-03-04 DIAGNOSIS — Z8249 Family history of ischemic heart disease and other diseases of the circulatory system: Secondary | ICD-10-CM | POA: Diagnosis not present

## 2021-03-04 DIAGNOSIS — Z20822 Contact with and (suspected) exposure to covid-19: Secondary | ICD-10-CM | POA: Diagnosis not present

## 2021-03-04 DIAGNOSIS — R42 Dizziness and giddiness: Secondary | ICD-10-CM | POA: Diagnosis present

## 2021-03-04 DIAGNOSIS — J449 Chronic obstructive pulmonary disease, unspecified: Secondary | ICD-10-CM | POA: Diagnosis not present

## 2021-03-04 DIAGNOSIS — N823 Fistula of vagina to large intestine: Secondary | ICD-10-CM | POA: Diagnosis not present

## 2021-03-04 DIAGNOSIS — K219 Gastro-esophageal reflux disease without esophagitis: Secondary | ICD-10-CM | POA: Diagnosis not present

## 2021-03-04 DIAGNOSIS — Z833 Family history of diabetes mellitus: Secondary | ICD-10-CM | POA: Diagnosis not present

## 2021-03-04 DIAGNOSIS — I110 Hypertensive heart disease with heart failure: Secondary | ICD-10-CM | POA: Diagnosis not present

## 2021-03-04 LAB — BASIC METABOLIC PANEL
Anion gap: 5 (ref 5–15)
BUN: 16 mg/dL (ref 8–23)
CO2: 27 mmol/L (ref 22–32)
Calcium: 8.7 mg/dL — ABNORMAL LOW (ref 8.9–10.3)
Chloride: 106 mmol/L (ref 98–111)
Creatinine, Ser: 0.8 mg/dL (ref 0.44–1.00)
GFR, Estimated: >60 mL/min (ref >60)
Glucose, Bld: 128 mg/dL — ABNORMAL HIGH (ref 70–99)
Potassium: 3.8 mmol/L (ref 3.5–5.1)
Sodium: 138 mmol/L (ref 135–145)

## 2021-03-04 LAB — MAGNESIUM: Magnesium: 1.9 mg/dL (ref 1.7–2.4)

## 2021-03-04 MED ORDER — MORPHINE SULFATE (PF) 2 MG/ML IV SOLN: 2.0000 mg | INTRAVENOUS | Status: DC | PRN

## 2021-03-04 MED ORDER — ATORVASTATIN CALCIUM 80 MG PO TABS: 80.0000 mg | ORAL_TABLET | Freq: Every day | ORAL | 0 refills | Status: DC

## 2021-03-04 MED ORDER — IOHEXOL 300 MG/ML  SOLN
100.0000 mL | Freq: Once | INTRAMUSCULAR | Status: AC | PRN
  Administered 2021-03-04: 100 mL via INTRAVENOUS

## 2021-03-04 MED ORDER — HYDROMORPHONE HCL 1 MG/ML IJ SOLN
1.0000 mg | INTRAMUSCULAR | Status: DC | PRN
  Administered 2021-03-04 (×2): 1 mg via INTRAVENOUS
  Filled 2021-03-04 (×2): qty 1

## 2021-03-04 NOTE — Progress Notes (Addendum)
Pt is compalining of abdominal pain 9/10 and moaning and pt described as " it hurts" by pt and was nauseous. IV zofran 4 mg given PRN. On assesment pt was guarding her abdomen. VSS. MD Mansy made aware. Will continue to monitor.  Update 0329: MD Mansy states will place order. Will continue to  monitor.  Update 0336: Dilaudid 1 mg IV given. Per pt report at 0427 no pain at this time. Will continue to monitor.  Update 0420: Pt just got back from CT. MD Mansy made awre. Will continue to monitor.  Update 0444: MD Mansy ordered US pelvic complete with Transvaginal. Will notify incoming shift. Will continue to monitor.

## 2021-03-04 NOTE — TOC Progression Note (Addendum)
Transition of Care [facility]) - Progression Note    Patient Details  Name: Tina Fields MRN: [id number] Date of Birth: Apr 25, 1946  [facility]) CM/SW Contact  Maree Krabbe, LCSW Phone Number: 03/04/2021, 10:57 AM  Clinical Narrative:    CSW presented at bedside to present Code 44 however pt not in room.  CSW attempted to provided letter x2--pt using the rest room with RN at bedside.   No signature obtained.        Expected Discharge Plan and Services                                                 Social Determinants of Health (SDOH) Interventions    Readmission Risk Interventions No flowsheet data found.

## 2021-03-04 NOTE — Discharge Summary (Signed)
Physician Discharge Summary  Patient ID: Tina Fields MRN: [id number] DOB/AGE: [birth year] 77 y.o.  Admit date: 03/02/2021 Discharge date: 03/04/2021  Admission Diagnoses:  Discharge Diagnoses:  Active Problems:   NSTEMI (non-ST elevated myocardial infarction) (HCC)   Elevated troponin   Discharged Condition: good  Hospital Course:  Tina Fields is a 75 y.o. African-American female with medical history significant for asthma, hypertension, rheumatoid arthritis and GERD, who presented to the emergency room with nausea vomiting. Patient had 2 episodes of COVID, with the most recent infection  1 month ago.  Patient had a diarrhea on Sunday, she had a poor appetite since then.  She started have nausea vomiting on Wednesday.  She at bedtime self check her blood pressure, was a pretty low.  When she arrived in the hospital, she was a found to have elevated troponin at 576.  Cardiology consult is obtained.  She is placed on heparin drip.  #1.  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 Nausea vomiting and recent diarrhea. Hypotension. Echocardiogram was performed, showed normal ejection fraction.  Seen by cardiology, condition most likely due to myocarditis due to recent COVID infection.  Patient does not have any chest pain short of breath.  Nausea vomiting has resolved.  At this point, she is medically stable to be discharged.  2.  Chronic combined systolic and diastolic congestive heart failure. Condition still stable, resumed all home medicines. Repeat echocardiogram showed ejection fraction 60 to 65%.  3.  COPD. Stable.  4.  Cholelithiasis. Patient has mild right upper quadrant pain, ultrasound showed cholelithiasis.  No clinical evidence of cholecystitis, no sonographic evidence of cholecystitis.  At this point, she will be followed by general surgery for future cholecystectomy.  5.  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 Initial CT scan suspect a mass in the uterus,  ultrasound did not show.  There is a possibility of colovaginal fistula.  Please refer patient to OB/GYN after seen by PCP.  Consults: cardiology  Significant Diagnostic Studies:  Echo: Left ventricular ejection fraction, by estimation, is 60 to 65%. The left ventricle has normal function. The left ventricle has no regional wall motion abnormalities. There is moderate left ventricular hypertrophy. Left ventricular diastolic parameters are consistent with Grade I diastolic dysfunction (impaired relaxation). 2. Right ventricular systolic function is normal. The right ventricular size is normal. 3. Left atrial size was mildly dilated. 4. The mitral valve is grossly normal. Trivial mitral valve regurgitation. 5. The aortic valve is tricuspid. Aortic valve regurgitation is trivial.  ULTRASOUND ABDOMEN LIMITED RIGHT UPPER QUADRANT   COMPARISON:  Same day.  Jan 18, 2021.   FINDINGS: Gallbladder:   Sludge and cholelithiasis is noted. No definite gallbladder wall thickening is noted; I believe the measured image includes peritoneal fat. No sonographic Murphy's sign is noted. There is the suggestion of pericholecystic fluid.   Common bile duct:   Diameter: 4 mm which is within normal limits.   Liver:   Multiple hepatic cysts are noted, the largest measuring 12 mm. Within normal limits in parenchymal echogenicity. Portal vein is patent on color Doppler imaging with normal direction of blood flow towards the liver.   Other: None.   IMPRESSION: Multiple hepatic cysts.   Cholelithiasis and gallbladder sludge is noted, although no definite gallbladder wall thickening is noted when compared to prior CT scan. However, pericholecystic fluid may be present. No sonographic Murphy's sign is noted. If there is clinical concern for cholecystitis, HIDA scan may be performed for further evaluation.     Electronically Signed  By: Lupita Raider M.D.   On: 03/04/2021 11:40    TRANSABDOMINAL ULTRASOUND OF PELVIS   TECHNIQUE: Transabdominal ultrasound examination of the pelvis was performed including evaluation of the uterus, ovaries, adnexal regions, and pelvic cul-de-sac. Patient was unable to tolerate transvaginal imaging.   COMPARISON:  CT from earlier in the same day as well as 01/18/2021   FINDINGS: Uterus   Measurements: 12.8 x 5.3 x 6.1 cm. = volume: 216 mL. Multiple calcifications are identified consistent with uterine fibroids similar to that seen on prior CT examination.   Endometrium   Not well visualized   Right ovary   Not well visualized   Left ovary   Not well visualized   Other findings: Increased echoes are noted in the region of the cervix consistent with the air pocket seen within the vaginal vault on recent CT examination. These changes are stable in appearance from a prior CT from May of 2022.   IMPRESSION: Multiple uterine fibroids.   Increased echogenicity in the region of the cervix and vaginal vault related to air seen on prior CT. The overall appearance is stable from 01/18/2021. This likely represents air in the vaginal vault. The possibility of a colovaginal fistula deserves consideration although no clinical symptomatology to correspond with this is noted. No findings on recent CT to suggest fistulization are noted either. Direct visualization is again recommended to assess for possible fistula or underlying inflammatory change.     Electronically Signed   By: Alcide Clever M.D.   On: 03/04/2021 11:51   CT ABDOMEN AND PELVIS WITH CONTRAST   TECHNIQUE: Multidetector CT imaging of the abdomen and pelvis was performed using the standard protocol following bolus administration of intravenous contrast.   CONTRAST:  OMNIPAQUE IOHEXOL 300 MG/ML  SOLN   COMPARISON:  CT Abdomen and Pelvis 01/18/2021. CT chest 11/05/2007.   FINDINGS: Lower chest: Stable cardiomegaly. No pericardial effusion.  Mildly improved lung base ventilation from May with underlying chronic subpleural scarring, possible mild lower lobe bronchiectasis. No pleural effusion.   Hepatobiliary: Multiple small benign hepatic cysts are redemonstrated and were present on a 2009 chest CT. Gallbladder appears within normal limits by CT. Bile ducts remain within normal limits.   Pancreas: Negative.   Spleen: Negative.   Adrenals/Urinary Tract: Multiple low-density benign appearing renal cysts. This includes a large 11.5 cm exophytic right upper pole cyst with simple fluid density which was much smaller but present in 2009. Nonobstructed kidneys. Symmetric renal enhancement and contrast excretion with decompressed ureters. No pararenal space inflammation or hemorrhage. Diminutive and unremarkable bladder. Incidental pelvic phleboliths.   Stomach/Bowel: Negative large bowel. Normal appendix on series 2, image 69. No dilated small bowel. Terminal ileum appears negative. Decompressed stomach. Small gastric hiatal hernia versus phrenic ampulla. No free air. No free fluid.   Vascular/Lymphatic: Extensive Aortoiliac calcified atherosclerosis. Major arterial structures in the abdomen and pelvis are patent. Portal venous system appears patent.   Reproductive: Unusual pattern of circumferential gas in the vagina or lower uterine segment surrounding central soft tissue (series 2, image 84). Otherwise stable small calcified uterine fibroids.   Other: No pelvic free fluid.   Musculoskeletal: No acute osseous abnormality identified. Lower lumbar facet degeneration.   IMPRESSION: 1. Unusual pattern of circumferential gas in the vagina or lower uterine segment surrounding central soft tissue suspicious for a Cervical or Vaginal Mass. Recommend Pelvic Exam.   2. No other acute or inflammatory process identified in the abdomen or pelvis. Normal appendix. Benign  hepatic and renal cysts. Mild lung base  fibrosis. Cardiomegaly. Aortic Atherosclerosis (ICD10-I70.0).     Electronically Signed   By: Odessa Fleming M.D.   On: 03/04/2021 04:30    Treatments: observation  Discharge Exam: Blood pressure (!) 119/93, pulse 67, temperature 98 F (36.7 C), resp. rate 18, height 5\' 6"  (1.676 m), weight 70.1 kg, SpO2 98 %. General appearance: alert and cooperative Resp: clear to auscultation bilaterally Cardio: regular rate and rhythm, S1, S2 normal, no murmur, click, rub or gallop GI: soft, RUQ tender, no rebound Extremities: extremities normal, atraumatic, no cyanosis or edema  Disposition: Discharge disposition: 01-Home or Self Care       Discharge Instructions     Diet - low sodium heart healthy   Complete by: As directed    Increase activity slowly   Complete by: As directed       Allergies as of 03/04/2021       Reactions   Ace Inhibitors Cough   Other reaction(s): Cough Other reaction(s): Unknown   Furosemide Other (See Comments)   Other reaction(s): Unknown   Hydrochlorothiazide Other (See Comments)   Latex Hives   Rofecoxib Other (See Comments)   Other reaction(s): Unknown   Azithromycin Rash        Medication List     STOP taking these medications    meloxicam 15 MG tablet Commonly known as: MOBIC   olmesartan 40 MG tablet Commonly known as: BENICAR       TAKE these medications    albuterol (2.5 MG/3ML) 0.083% nebulizer solution Commonly known as: PROVENTIL Take 2.5 mg by nebulization every 6 (six) hours as needed for wheezing.   albuterol 108 (90 Base) MCG/ACT inhaler Commonly known as: VENTOLIN HFA Inhale 1-2 puffs into the lungs every 6 (six) hours as needed.   aspirin 81 MG EC tablet Take 1 tablet (81 mg total) by mouth daily. Swallow whole.   atorvastatin 80 MG tablet Commonly known as: LIPITOR Take 1 tablet (80 mg total) by mouth daily. Start taking on: March 05, 2021   budesonide-formoterol 80-4.5 MCG/ACT inhaler Commonly known as:  SYMBICORT Inhale 2 puffs into the lungs 2 (two) times daily as needed.   [employer] VITAMIN B12 1000 MCG tablet Generic drug: cyanocobalamin Take 1,000 mcg by mouth daily.   cyclobenzaprine 5 MG tablet Commonly known as: FLEXERIL Take 5 mg by mouth See admin instructions. Take 5 mg by mouth once daily at bedtime as needed for muscle pain   Entresto 24-26 MG Generic drug: sacubitril-valsartan Take 1 tablet by mouth daily.   fluticasone 50 MCG/ACT nasal spray Commonly known as: FLONASE Place 2 sprays into the nose daily.   furosemide 40 MG tablet Commonly known as: LASIX Take 1 tablet (40 mg total) by mouth daily.   hydrALAZINE 25 MG tablet Commonly known as: APRESOLINE Take 1 tablet (25 mg total) by mouth 3 (three) times daily.   isosorbide mononitrate 30 MG 24 hr tablet Commonly known as: IMDUR Take 1 tablet (30 mg total) by mouth daily.   meclizine 25 MG tablet Commonly known as: ANTIVERT Take 25 mg by mouth 3 (three) times daily as needed for dizziness.   metoprolol succinate 25 MG 24 hr tablet Commonly known as: TOPROL-XL Take 25 mg by mouth daily.   MiraLax 17 GM/SCOOP powder Generic drug: polyethylene glycol powder Take 17 g by mouth See admin instructions. Dissolve 17 gm in 8 ounces of water and drink once or twice daily.   montelukast 10 MG  tablet Commonly known as: SINGULAIR Take 10 mg by mouth at bedtime.   spironolactone 25 MG tablet Commonly known as: ALDACTONE Take 12.5 mg by mouth daily.   Vitamin D (Ergocalciferol) 1.25 MG (50000 UNIT) Caps capsule Commonly known as: DRISDOL Take 50,000 Units by mouth every 7 (seven) days.        Follow-up Information     Bender, Earl Lagos, MD Follow up in 1 week(s).   Specialty: Family Medicine Contact information: [address] [place] [address] [phone]         Dalia Heading, MD Follow up in 3 week(s).   Specialty: Cardiology Contact information: [address] [place] [address] [phone]         Sung Amabile, DO Follow up in 2 week(s).   Specialty: Surgery Contact information: [address] [place] [address] [phone]                 Signed: Marrion Coy 03/04/2021, 1:19 PM

## 2021-03-04 NOTE — Plan of Care (Signed)

## 2021-03-04 NOTE — Consult Note (Signed)
Patient Name: Tina Fields Date of Encounter: 03/04/2021  Hospital Problem List     Active Problems:   NSTEMI (non-ST elevated myocardial infarction) [facility])   Elevated troponin    Patient Profile     75 y.o. female with history of no cardiac problems but does have hypertension who presented to emergency room after having malaise nausea and vomiting.  She presented emergency room with no chest pain.  EKG was unremarkable.  She had no abdominal pain.  Per ER protocol, troponin was drawn which showed elevated serum troponin with levels at 439 and 576.  Patient still has no chest pain.  She does complain of some epigastric discomfort which occurred after her vomiting.  She is hemodynamically stable.  She was placed on heparin.  She does not appear to be a candidate for any ischemic work-up at present as her echocardiogram showed preserved LV function with no regional wall motion abnormalities.  Subjective   Complaints of abdominal pain.  Inpatient Medications     aspirin EC  81 mg Oral Daily   atorvastatin  80 mg Oral Daily   fluticasone  2 spray Each Nare Daily   furosemide  40 mg Oral Daily   hydrALAZINE  25 mg Oral TID   isosorbide mononitrate  30 mg Oral Daily   metoprolol succinate  25 mg Oral Daily   sacubitril-valsartan  1 tablet Oral Daily   spironolactone  12.5 mg Oral Daily   cyanocobalamin  1,000 mcg Oral Daily   [START ON 03/06/2021] Vitamin D (Ergocalciferol)  50,000 Units Oral Q7 days    Vital Signs    Vitals:   03/03/21 2114 03/04/21 0302 03/04/21 0414 03/04/21 0417  BP: 105/68  125/61   Pulse:      Resp:      Temp:  97.6 F (36.4 C) 97.8 F (36.6 C)   TempSrc:  Oral Oral   SpO2:  100% (!) 85% 98%  Weight:      Height:        Intake/Output Summary (Last 24 hours) at 03/04/2021 0737 Last data filed at 03/04/2021 0555 Gross per 24 hour  Intake 720 ml  Output 400 ml  Net 320 ml   Filed Weights   03/02/21 2005 03/03/21 1058  Weight: 69.4 kg 70.1 kg     Physical Exam    GEN: Well nourished, well developed, in no acute distress.  HEENT: normal.  Neck: Supple, no JVD, carotid bruits, or masses. Cardiac: RRR, no murmurs, rubs, or gallops. No clubbing, cyanosis, edema.  Radials/DP/PT 2+ and equal bilaterally.  Respiratory:  Respirations regular and unlabored, clear to auscultation bilaterally. GI: Soft, nontender, nondistended, BS + x 4. MS: no deformity or atrophy. Skin: warm and dry, no rash. Neuro:  Strength and sensation are intact. Psych: Normal affect.  Labs    CBC Recent Labs    03/02/21 2037 03/02/21 2157 03/03/21 0642  WBC 3.5* 5.9 5.8  NEUTROABS 2.5 4.4  --   HGB 6.6* 11.3* 10.8*  HCT 20.5* 34.5* 32.0*  MCV 97.6 95.6 93.8  PLT 216 353 352   Basic Metabolic Panel Recent Labs    45/40/98 2037 03/03/21 0642  NA 138 140  K 3.4* 3.8  CL 103 105  CO2 24 27  GLUCOSE 92 116*  BUN 17 20  CREATININE 1.05* 0.89  CALCIUM 8.5* 8.6*  MG  --  1.8   Liver Function Tests Recent Labs    03/02/21 2037  AST 14*  ALT  7  ALKPHOS 78  BILITOT 0.7  PROT 7.2  ALBUMIN 2.5*   No results for input(s): LIPASE, AMYLASE in the last 72 hours. Cardiac Enzymes No results for input(s): CKTOTAL, CKMB, CKMBINDEX, TROPONINI in the last 72 hours. BNP No results for input(s): BNP in the last 72 hours. D-Dimer Recent Labs    03/03/21 0642  DDIMER 3.32*   Hemoglobin A1C No results for input(s): HGBA1C in the last 72 hours. Fasting Lipid Panel Recent Labs    03/03/21 0642  CHOL 154  HDL 27*  LDLCALC 111*  TRIG 82  CHOLHDL 5.7   Thyroid Function Tests No results for input(s): TSH, T4TOTAL, T3FREE, THYROIDAB in the last 72 hours.  Invalid input(s): FREET3  Telemetry    Normal sinus rhythm with PVC occasionally in bigeminal pattern.  ECG    Normal sinus rhythm  Radiology    DG Knee 1-2 Views Left  Result Date: 03/03/2021 CLINICAL DATA:  Knee pain and swelling.  No known injury. EXAM: LEFT KNEE - 1-2 VIEW  COMPARISON:  None. FINDINGS: No evidence of fracture, dislocation, or joint effusion. No evidence of arthropathy or other focal bone abnormality. Mild spurring off the superior and inferior poles of the patella noted. Soft tissues are unremarkable. IMPRESSION: No acute abnormality or finding to explain the patient's symptoms. Electronically Signed   By: Drusilla Kanner M.D.   On: 03/03/2021 13:18   CT ABDOMEN PELVIS W CONTRAST  Result Date: 03/04/2021 CLINICAL DATA:  75 year old female with abdominal pain and hypotension. EXAM: CT ABDOMEN AND PELVIS WITH CONTRAST TECHNIQUE: Multidetector CT imaging of the abdomen and pelvis was performed using the standard protocol following bolus administration of intravenous contrast. CONTRAST:  OMNIPAQUE IOHEXOL 300 MG/ML  SOLN COMPARISON:  CT Abdomen and Pelvis 01/18/2021. CT chest 11/05/2007. FINDINGS: Lower chest: Stable cardiomegaly. No pericardial effusion. Mildly improved lung base ventilation from May with underlying chronic subpleural scarring, possible mild lower lobe bronchiectasis. No pleural effusion. Hepatobiliary: Multiple small benign hepatic cysts are redemonstrated and were present on a 2009 chest CT. Gallbladder appears within normal limits by CT. Bile ducts remain within normal limits. Pancreas: Negative. Spleen: Negative. Adrenals/Urinary Tract: Multiple low-density benign appearing renal cysts. This includes a large 11.5 cm exophytic right upper pole cyst with simple fluid density which was much smaller but present in 2009. Nonobstructed kidneys. Symmetric renal enhancement and contrast excretion with decompressed ureters. No pararenal space inflammation or hemorrhage. Diminutive and unremarkable bladder. Incidental pelvic phleboliths. Stomach/Bowel: Negative large bowel. Normal appendix on series 2, image 69. No dilated small bowel. Terminal ileum appears negative. Decompressed stomach. Small gastric hiatal hernia versus phrenic ampulla. No free  air. No free fluid. Vascular/Lymphatic: Extensive Aortoiliac calcified atherosclerosis. Major arterial structures in the abdomen and pelvis are patent. Portal venous system appears patent. Reproductive: Unusual pattern of circumferential gas in the vagina or lower uterine segment surrounding central soft tissue (series 2, image 84). Otherwise stable small calcified uterine fibroids. Other: No pelvic free fluid. Musculoskeletal: No acute osseous abnormality identified. Lower lumbar facet degeneration. IMPRESSION: 1. Unusual pattern of circumferential gas in the vagina or lower uterine segment surrounding central soft tissue suspicious for a Cervical or Vaginal Mass. Recommend Pelvic Exam. 2. No other acute or inflammatory process identified in the abdomen or pelvis. Normal appendix. Benign hepatic and renal cysts. Mild lung base fibrosis. Cardiomegaly. Aortic Atherosclerosis (ICD10-I70.0). Electronically Signed   By: Odessa Fleming M.D.   On: 03/04/2021 04:30   US Venous Img Lower Unilateral Left  Result Date: 03/02/2021 CLINICAL DATA:  Pain, swelling EXAM: LEFT LOWER EXTREMITY VENOUS DOPPLER ULTRASOUND TECHNIQUE: Gray-scale sonography with compression, as well as color and duplex ultrasound, were performed to evaluate the deep venous system(s) from the level of the common femoral vein through the popliteal and proximal calf veins. COMPARISON:  None. FINDINGS: VENOUS Normal compressibility of the common femoral, superficial femoral, and popliteal veins, as well as the visualized calf veins. Visualized portions of profunda femoral vein and great saphenous vein unremarkable. No filling defects to suggest DVT on grayscale or color Doppler imaging. Doppler waveforms show normal direction of venous flow, normal respiratory plasticity and response to augmentation. Limited views of the contralateral common femoral vein are unremarkable. OTHER Popliteal cyst measures 11.5 x 1.6 x 1.4 cm Limitations: none IMPRESSION: No evidence  of left lower extremity DVT. Baker's cyst. Electronically Signed   By: Charlett Nose M.D.   On: 03/02/2021 22:46   DG Chest Portable 1 View  Result Date: 03/02/2021 CLINICAL DATA:  75 year old female with near syncope. EXAM: PORTABLE CHEST 1 VIEW COMPARISON:  Chest radiograph dated 01/02/2021. FINDINGS: There is mild if interstitial coarsening and bibasilar atelectasis/scarring. No focal consolidation, pleural effusion, or pneumothorax. Mild cardiomegaly. Atherosclerotic calcification of the aorta. No acute osseous pathology. Degenerative changes of the spine. IMPRESSION: No acute cardiopulmonary process. Electronically Signed   By: Elgie Collard M.D.   On: 03/02/2021 20:21   ECHOCARDIOGRAM COMPLETE  Result Date: 03/03/2021    ECHOCARDIOGRAM REPORT   Patient Name:   Tina Fields Date of Exam: 03/03/2021 Medical Rec #:  [id number]             Height:       66.0 in Accession #:    [id number]            Weight:       153.0 lb Date of Birth:  [birth year]              BSA:          1.785 m Patient Age:    75 years              BP:           119/69 mmHg Patient Gender: F                     HR:           72 bpm. Exam Location:  [facility] Procedure: 2D Echo, Color Doppler, Cardiac Doppler and Strain Analysis Indications:     I21.4 NSTEMI  History:         Patient has no prior history of Echocardiogram examinations.                  Signs/Symptoms:Hypotension.  Sonographer:     Humphrey Rolls RDCS (AE) Referring Phys:  [id number] Vernetta Honey MANSY Diagnosing Phys: Harold Hedge MD  Sonographer Comments: Global longitudinal strain was attempted. IMPRESSIONS  1. Left ventricular ejection fraction, by estimation, is 60 to 65%. The left ventricle has normal function. The left ventricle has no regional wall motion abnormalities. There is moderate left ventricular hypertrophy. Left ventricular diastolic parameters are consistent with Grade I diastolic dysfunction (impaired relaxation).  2. Right ventricular systolic function is  normal. The right ventricular size is normal.  3. Left atrial size was mildly dilated.  4. The mitral valve is grossly normal. Trivial mitral valve regurgitation.  5. The aortic valve is tricuspid. Aortic valve regurgitation is trivial. FINDINGS  Left Ventricle: Left ventricular ejection fraction, by estimation, is 60 to 65%. The left ventricle has normal function. The left ventricle has no regional wall motion abnormalities. The left ventricular internal cavity size was normal in size. There is  moderate left ventricular hypertrophy. Left ventricular diastolic parameters are consistent with Grade I diastolic dysfunction (impaired relaxation). Right Ventricle: The right ventricular size is normal. No increase in right ventricular wall thickness. Right ventricular systolic function is normal. Left Atrium: Left atrial size was mildly dilated. Right Atrium: Right atrial size was normal in size. Pericardium: There is no evidence of pericardial effusion. Mitral Valve: The mitral valve is grossly normal. Trivial mitral valve regurgitation. MV peak gradient, 5.3 mmHg. The mean mitral valve gradient is 2.0 mmHg. Tricuspid Valve: The tricuspid valve is grossly normal. Tricuspid valve regurgitation is mild. Aortic Valve: The aortic valve is tricuspid. Aortic valve regurgitation is trivial. Aortic valve mean gradient measures 4.0 mmHg. Aortic valve peak gradient measures 7.4 mmHg. Aortic valve area, by VTI measures 1.42 cm. Pulmonic Valve: The pulmonic valve was grossly normal. Pulmonic valve regurgitation is not visualized. Aorta: The aortic root is normal in size and structure. IAS/Shunts: The atrial septum is grossly normal.  LEFT VENTRICLE PLAX 2D LVIDd:         5.40 cm  Diastology LVIDs:         3.40 cm  LV e' medial:    3.70 cm/s LV PW:         1.00 cm  LV E/e' medial:  11.2 LV IVS:        0.80 cm  LV e' lateral:   5.98 cm/s LVOT diam:     1.80 cm  LV E/e' lateral: 7.0 LV SV:         41 LV SV Index:   23 LVOT Area:      2.54 cm  RIGHT VENTRICLE RV Basal diam:  2.50 cm LEFT ATRIUM             Index       RIGHT ATRIUM           Index LA diam:        4.40 cm 2.47 cm/m  RA Area:     14.30 cm LA Vol (A2C):   73.2 ml 41.02 ml/m RA Volume:   39.00 ml  21.85 ml/m LA Vol (A4C):   55.3 ml 30.99 ml/m LA Biplane Vol: 64.6 ml 36.20 ml/m  AORTIC VALVE                   PULMONIC VALVE AV Area (Vmax):    1.47 cm    PV Vmax:       0.99 m/s AV Area (Vmean):   1.34 cm    PV Vmean:      68.500 cm/s AV Area (VTI):     1.42 cm    PV VTI:        0.216 m AV Vmax:           136.00 cm/s PV Peak grad:  3.9 mmHg AV Vmean:          93.300 cm/s PV Mean grad:  2.0 mmHg AV VTI:            0.286 m AV Peak Grad:      7.4 mmHg AV Mean Grad:      4.0 mmHg LVOT Vmax:         78.30 cm/s LVOT Vmean:  49.300 cm/s LVOT VTI:          0.160 m LVOT/AV VTI ratio: 0.56  AORTA Ao Root diam: 3.00 cm MITRAL VALVE               TRICUSPID VALVE MV Area (PHT): 6.90 cm    TR Peak grad:   39.7 mmHg MV Area VTI:   1.82 cm    TR Vmax:        315.00 cm/s MV Peak grad:  5.3 mmHg MV Mean grad:  2.0 mmHg    SHUNTS MV Vmax:       1.15 m/s    Systemic VTI:  0.16 m MV Vmean:      60.8 cm/s   Systemic Diam: 1.80 cm MV Decel Time: 110 msec MV E velocity: 41.60 cm/s MV A velocity: 90.80 cm/s MV E/A ratio:  0.46 Harold Hedge MD Electronically signed by Harold Hedge MD Signature Date/Time: 03/03/2021/10:23:14 AM    Final     Assessment & Plan      75 year old female with no prior cardiac history presented with nausea and vomiting.  EKG was unremarkable.  Patient denied chest pain.  Serum troponins were drawn in the ER which were elevated at in the mid 400s to low 500s but flat.  Again she had no chest pain and no EKG ischemic changes.  She has some epigastric discomfort but no upper chest pain.  1.  Abnormal troponin-appears to be likely strain secondary to her nausea and vomiting as well as recent myocardial irritation due to COVID-19.   We will continue with current  regimen including metoprolol to succinate 25 mg daily, isosorbide mononitrate 30 mg daily, hydralazine 25 mg 3 times daily, furosemide 40 daily, atorvastatin 80 daily, sacubitril-valsartan 24-26 daily.  We will also continue with spironolactone.  We will continue to follow from a cardiac standpoint.  Remain off heparin.  2.  Hypertension-continue on current regimen.  3.  Abdominal pain with nausea.  We will pattern of circumferential gas in the vagina to the lower uterine segment an unusual suspicious for a cervical or vaginal mass.  No other acute inflammatory process in the abdomen or pelvis.  Normal appendix.  Benign hepatic and renal cysts.  Pelvic ultrasound is pending.   Signed, Darlin Priestly Rhiannon Sassaman MD 03/04/2021, 7:37 AM  Pager: [phone]

## 2021-03-15 DIAGNOSIS — N2889 Other specified disorders of kidney and ureter: Secondary | ICD-10-CM | POA: Insufficient documentation

## 2021-03-15 DIAGNOSIS — I2510 Atherosclerotic heart disease of native coronary artery without angina pectoris: Secondary | ICD-10-CM | POA: Insufficient documentation

## 2021-03-21 ENCOUNTER — Ambulatory Visit (INDEPENDENT_AMBULATORY_CARE_PROVIDER_SITE_OTHER): Payer: Medicare HMO | Admitting: Surgery

## 2021-03-21 ENCOUNTER — Other Ambulatory Visit: Payer: Self-pay

## 2021-03-21 ENCOUNTER — Encounter: Payer: Self-pay | Admitting: Surgery

## 2021-03-21 VITALS — BP 104/68 | HR 104 | Temp 98.7°F | Ht 66.0 in | Wt 160.4 lb

## 2021-03-21 DIAGNOSIS — K8020 Calculus of gallbladder without cholecystitis without obstruction: Secondary | ICD-10-CM

## 2021-03-21 NOTE — Patient Instructions (Addendum)
Follow up with your heart doctor as scheduled. Use your water pill as needed.  Follow up here in about 3-4 weeks.   Biliary Colic, Adult  Biliary colic is severe pain caused by a problem with the gallbladder. The gallbladder is a small organ in the upper right part of the abdomen. The gallbladder stores a digestive fluid produced in the liver (bile) that helps the body break down fat. Bile and other digestive enzymes are carried from the liver to the small intestine through tube-like structurescalled bile ducts. The gallbladder and the bile ducts form the biliary tract. Sometimes, hard deposits of digestive fluids (gallstones) form in the gallbladder and block the flow of bile from the gallbladder, causing biliary colic. This condition is also called a gallbladder attack. Gallstones can be as small as a grain of sand or as big as a golf ball. Therecould be just one gallstone in the gallbladder, or there could be many. What are the causes? This condition is usually caused by gallstones. Less often, a tumor could blockthe flow of bile from the gallbladder and trigger biliary colic. What increases the risk? The following factors may make you more likely to develop this condition: Being female. Having a family history of gallstones. Being obese. Losing weight suddenly or quickly. Eating a diet that is high in calories, low in fiber, and rich in refined carbohydrates, such as white bread and white rice. Having certain health conditions, such as: An intestinal disease that affects nutrient absorption, such as Crohn's disease. A metabolic condition, such as diabetes or metabolic syndrome. Metabolic syndrome occurs when someone has high blood pressure, high cholesterol, and diabetes. A blood condition, such as hemolytic anemia or sickle cell disease. What are the signs or symptoms? The main symptom of this condition is severe pain in the upper right side of the abdomen. You may feel this pain below the  chest but above the hip. This pain often occurs at night or after eating a meal that is high in fat. This pain may get worse for up to an hour and last as long as 12 hours. In most cases, the pain fades (subsides) within 2 hours. Other symptoms of this condition include: Nausea and vomiting. Pain under the right shoulder. How is this diagnosed? This condition is diagnosed based on your medical history, your symptoms, and aphysical exam. You may also have tests, including: Blood tests to rule out infection or inflammation of the bile ducts, gallbladder, pancreas, or liver. Imaging studies, such as: An ultrasound. A CT scan. An MRI. In some cases, you may need to have an imaging study done using a small amount of radioactive material (nuclear medicine) to confirm the diagnosis. How is this treated? This condition may be treated with medicines to: Relieve your pain or nausea. Dissolve the gallstones. It may take months or years before the gallstones are completely gone. If you have gallstones, or if you have a tumor in the gallbladder that is causing biliary colic, you may need surgery to remove the gallbladder (cholecystectomy). Follow these instructions at home: Eating and drinking Drink enough fluid to keep your urine pale yellow. Follow instructions from your health care provider about eating or drinking restrictions. These may include avoiding: Fatty, greasy, and fried foods. Any foods that make the pain worse. Overeating. Having a large meal after not eating for a while. General instructions Take over-the-counter and prescription medicines only as told by your health care provider. Keep all follow-up visits as told by your  health care provider. This is important. How is this prevented? Steps to prevent this condition include: Maintaining a healthy body weight. Getting regular exercise. Eating a healthy diet that is high in fiber and low in fat. Limiting how much sugar and refined  carbohydrates you eat. Contact a health care provider if: Your pain lasts more than 5 hours. You vomit. You have a fever and chills. Your pain gets worse. Get help right away if: Your skin or the whites of your eyes look yellow (jaundice). Your have tea-colored urine and light-colored stools (feces). You are dizzy or you faint. Summary Biliary colic is severe pain caused by a problem with the gallbladder. The gallbladder is a small organ in the upper right part of your abdomen. Treatment for this condition may include medicine to relieve your pain or nausea, or medicine to slowly dissolve the gallstones. If you have gallstones, or if you have a tumor in the gallbladder that is causing biliary colic, you may need surgery to remove the gallbladder (cholecystectomy). This information is not intended to replace advice given to you by your health care provider. Make sure you discuss any questions you have with your healthcare provider. Document Revised: 09/02/2019 Document Reviewed: 06/24/2019 Elsevier Patient Education  2022 [employer].

## 2021-03-22 ENCOUNTER — Encounter: Payer: Self-pay | Admitting: Surgery

## 2021-03-22 NOTE — Progress Notes (Signed)
Patient ID: Tina Fields, female   DOB: 1946/04/15, 75 y.o.   MRN: [id number]  HPI Tina Fields is a 75 y.o. female seen in consultation at the request of Dr. Hessie Diener.  She does have a significant history of coronary artery disease.  Recently was admitted to the hospital couple weeks ago for nausea vomiting failure to thrive and at that time troponins were found to be elevated.  Dr. Lady Gary saw her in consultation and felt to be related to demand ischemia.  Work-up at that time included a CT scan as well as an ultrasound of the right upper quadrant that have personally reviewed showing evidence of cholelithiasis.  There is also some gas lower uterine segment but she was not symptomatic from her pelvis.  CBC was normal and CMP was normal other than a creatinine of 1.33.  She is currently being seen by cardiology and her latest stress test show some evidence of reversible ischemia and she is currently scheduled for a cardiac cath. She reports dyspnea on exertion and fatigue.  She walks with a walker. ECHO showed EF 60-65%  HPI  Past Medical History:  Diagnosis Date   Acid reflux    Asthma    Collagen vascular disease (HCC)    RA   Edema of both legs    Hypertension    RA (rheumatoid arthritis) (HCC)     Past Surgical History:  Procedure Laterality Date   TUBAL LIGATION      Family History  Problem Relation Age of Onset   CAD Mother    Diabetes Mother    Hypertension Mother    CAD Father    Diabetes Father    Hypertension Father    Stroke Father     Social History Social History   Tobacco Use   Smoking status: Former    Types: Cigarettes    Quit date: 01/09/1995    Years since quitting: 26.2   Smokeless tobacco: Never  Vaping Use   Vaping Use: Never used  Substance Use Topics   Alcohol use: No   Drug use: No    Allergies  Allergen Reactions   Ace Inhibitors Cough    Other reaction(s): Cough Other reaction(s): Unknown    Furosemide Other (See Comments)     Other reaction(s): Unknown    Hydrochlorothiazide Other (See Comments)   Latex Hives   Rofecoxib Other (See Comments)    Other reaction(s): Unknown    Azithromycin Rash    Current Outpatient Medications  Medication Sig Dispense Refill   albuterol (PROVENTIL) (2.5 MG/3ML) 0.083% nebulizer solution Take 2.5 mg by nebulization every 6 (six) hours as needed for wheezing.     albuterol (VENTOLIN HFA) 108 (90 Base) MCG/ACT inhaler Inhale 1-2 puffs into the lungs every 6 (six) hours as needed.     aspirin EC 81 MG EC tablet Take 1 tablet (81 mg total) by mouth daily. Swallow whole. 30 tablet 11   atorvastatin (LIPITOR) 40 MG tablet Take 40 mg by mouth daily.     budesonide-formoterol (SYMBICORT) 80-4.5 MCG/ACT inhaler Inhale 2 puffs into the lungs 2 (two) times daily as needed.     [employer] VITAMIN B12 1000 MCG tablet Take 1,000 mcg by mouth daily.     cyclobenzaprine (FLEXERIL) 5 MG tablet Take 5 mg by mouth See admin instructions. Take 5 mg by mouth once daily at bedtime as needed for muscle pain     ENTRESTO 24-26 MG Take 1 tablet by mouth daily.  fluticasone (FLONASE) 50 MCG/ACT nasal spray Place 2 sprays into the nose daily.     metoprolol succinate (TOPROL-XL) 25 MG 24 hr tablet Take 25 mg by mouth daily.     MIRALAX 17 GM/SCOOP powder Take 17 g by mouth See admin instructions. Dissolve 17 gm in 8 ounces of water and drink once or twice daily.     montelukast (SINGULAIR) 10 MG tablet Take 10 mg by mouth at bedtime.     spironolactone (ALDACTONE) 25 MG tablet Take 12.5 mg by mouth daily.     Vitamin D, Ergocalciferol, (DRISDOL) 1.25 MG (50000 UNIT) CAPS capsule Take 50,000 Units by mouth every 7 (seven) days.     No current facility-administered medications for this visit.     Review of Systems Full ROS  was asked and was negative except for the information on the HPI  Physical Exam Blood pressure 104/68, pulse (!) 104, temperature 98.7 F (37.1 C), temperature source Oral, height  5\' 6"  (1.676 m), weight 160 lb 6.4 oz (72.8 kg), SpO2 94 %. CONSTITUTIONAL: NAD , she comes in with a walker. EYES: Pupils are equal, round, Sclera are non-icteric. EARS, NOSE, MOUTH AND THROAT: SHe is wearing a mask. Hearing is intact to voice. LYMPH NODES:  Lymph nodes in the neck are normal. RESPIRATORY:  Lungs are clear. There is normal respiratory effort, with equal breath sounds bilaterally, and without pathologic use of accessory muscles. CARDIOVASCULAR: Heart is regular without murmurs, gallops, or rubs. GI: The abdomen is soft, nontender, and nondistended. There are no palpable masses. There is no hepatosplenomegaly. There are normal bowel sounds in all quadrants. GU: Rectal deferred.   MUSCULOSKELETAL: Normal muscle strength and tone. No cyanosis or edema.   SKIN: Turgor is good and there are no pathologic skin lesions or ulcers. NEUROLOGIC: Motor and sensation is grossly normal. Cranial nerves are grossly intact. PSYCH:  Oriented to person, place and time. Affect is normal.  Data Reviewed  I have personally reviewed the patient's imaging, laboratory findings and medical records.    Assessment/Plan 75 year old female with findings consistent with biliary colic.  She does have significant comorbidities that includes coronary artery disease and a recent positive stress test.  She is currently being worked up by cardiology and has an upcoming cardiac cath.  Given cardiac issues he will be prudent to wait until cardiac issues have been resolved or stabilized.  She is not septic and does not require hospitalization or any urgent surgical interventions. Time spent with the patient was 50 minutes, with more than 50% of the time spent in face-to-face education, counseling and care coordination.     60, MD FACS General Surgeon 03/22/2021, 3:51 PM

## 2021-04-11 NOTE — Progress Notes (Signed)
Request for Cardiology Clearance has been faxed to Phineas Inches, NP at [facility].

## 2021-04-13 ENCOUNTER — Ambulatory Visit: Payer: Medicare HMO | Admitting: Surgery

## 2021-04-20 ENCOUNTER — Ambulatory Visit: Payer: Medicare HMO | Admitting: Surgery

## 2021-04-25 ENCOUNTER — Ambulatory Visit: Payer: Medicare HMO | Admitting: Surgery

## 2021-04-25 ENCOUNTER — Other Ambulatory Visit: Payer: Self-pay

## 2021-04-25 ENCOUNTER — Encounter: Payer: Self-pay | Admitting: Surgery

## 2021-04-25 VITALS — BP 111/70 | HR 76 | Temp 98.0°F | Ht 66.0 in | Wt 155.4 lb

## 2021-04-25 DIAGNOSIS — K8020 Calculus of gallbladder without cholecystitis without obstruction: Secondary | ICD-10-CM

## 2021-04-25 DIAGNOSIS — R42 Dizziness and giddiness: Secondary | ICD-10-CM

## 2021-04-25 NOTE — H&P (View-Only) (Signed)
Outpatient Surgical Follow Up  04/26/2021  Tina Fields is an 75 y.o. female.   Chief Complaint  Patient presents with   Follow-up    HPI: Tina Fields is a 75 y.o. female known to me with a prior history of biliary colic.  She does have significant coronary artery disease and underwent Cardiologic evaluation.  For now they wanted to manage her medically and no need for revascularization procedures at this time. Continues to improve.  She has some intermittent right upper quadrant pain but her last attack was a few weeks ago.  She denies any fevers any chills no evidence of biliary obstruction. She now reports some dizziness.  No recent falls.  No evidence of neurological deficits    Past Medical History:  Diagnosis Date   Acid reflux    Asthma    Collagen vascular disease (HCC)    RA   Edema of both legs    Hypertension    RA (rheumatoid arthritis) (HCC)     Past Surgical History:  Procedure Laterality Date   TUBAL LIGATION      Family History  Problem Relation Age of Onset   CAD Mother    Diabetes Mother    Hypertension Mother    CAD Father    Diabetes Father    Hypertension Father    Stroke Father     Social History:  reports that she quit smoking about 26 years ago. Her smoking use included cigarettes. She has never used smokeless tobacco. She reports that she does not drink alcohol and does not use drugs.  Allergies:  Allergies  Allergen Reactions   Ace Inhibitors Cough    Other reaction(s): Cough Other reaction(s): Unknown    Furosemide Other (See Comments)    Other reaction(s): Unknown    Hydrochlorothiazide Other (See Comments)   Latex Hives   Rofecoxib Other (See Comments)    Other reaction(s): Unknown    Azithromycin Rash    Medications reviewed.    ROS Full ROS performed and is otherwise negative other than what is stated in HPI   BP 111/70   Pulse 76   Temp 98 F (36.7 C)   Ht 5\' 6"  (1.676 m)   Wt 155 lb 6.4 oz  (70.5 kg)   SpO2 96%   BMI 25.08 kg/m   Physical Exam Vitals and nursing note reviewed. Exam conducted with a chaperone present.  Constitutional:      General: She is not in acute distress.    Appearance: Normal appearance. She is normal weight.  Cardiovascular:     Rate and Rhythm: Normal rate and regular rhythm.     Heart sounds: No murmur heard. Pulmonary:     Effort: Pulmonary effort is normal. No respiratory distress.     Breath sounds: Normal breath sounds. No stridor.  Abdominal:     General: Abdomen is flat. There is no distension.     Palpations: Abdomen is soft. There is no mass.     Tenderness: There is no abdominal tenderness. There is no left CVA tenderness, guarding or rebound.     Hernia: No hernia is present.  Musculoskeletal:     Cervical back: Normal range of motion and neck supple. No rigidity or tenderness.  Skin:    General: Skin is warm and dry.     Capillary Refill: Capillary refill takes less than 2 seconds.     Coloration: Skin is not jaundiced.  Neurological:     General: No focal  deficit present.     Mental Status: She is alert and oriented to person, place, and time.  Psychiatric:        Mood and Affect: Mood normal.        Behavior: Behavior normal.        Thought Content: Thought content normal.        Judgment: Judgment normal.      Assessment/Plan: Tina Fields is a 75 year old female with history of coronary artery disease and also symptomatic cholelithiasis.  She will definitely require cholecystectomy at some point in time.  She is also having some dizziness which I cannot have a real good explanation.  She wishes me to get an ENT referral and will ask Dr. Jenne Campus to see her. We have already obtained cardiology optimization and they seem to think that she is a reasonable surgical candidate. I discussed the procedure in detail.  The patient was given Agricultural engineer.  We discussed the risks and benefits of a laparoscopic cholecystectomy  and possible cholangiogram including, but not limited to bleeding, infection, injury to surrounding structures such as the intestine or liver, bile leak, retained gallstones, need to convert to an open procedure, prolonged diarrhea, blood clots such as  DVT, common bile duct injury, anesthesia risks, and possible need for additional procedures.  The likelihood of improvement in symptoms and return to the patient's normal status is good. We discussed the typical post-operative recovery course.   Greater than 50% of the 40 minutes  visit was spent in counseling/coordination of care   Sterling Big, MD [facility] General Surgeon

## 2021-04-25 NOTE — Progress Notes (Signed)
Outpatient Surgical Follow Up  04/26/2021  Tina Fields is an 75 y.o. female.   Chief Complaint  Patient presents with   Follow-up    HPI: Tina Fields is a 75 y.o. female known to me with a prior history of biliary colic.  She does have significant coronary artery disease and underwent Cardiologic evaluation.  For now they wanted to manage her medically and no need for revascularization procedures at this time. Continues to improve.  She has some intermittent right upper quadrant pain but her last attack was a few weeks ago.  She denies any fevers any chills no evidence of biliary obstruction. She now reports some dizziness.  No recent falls.  No evidence of neurological deficits    Past Medical History:  Diagnosis Date   Acid reflux    Asthma    Collagen vascular disease (HCC)    RA   Edema of both legs    Hypertension    RA (rheumatoid arthritis) (HCC)     Past Surgical History:  Procedure Laterality Date   TUBAL LIGATION      Family History  Problem Relation Age of Onset   CAD Mother    Diabetes Mother    Hypertension Mother    CAD Father    Diabetes Father    Hypertension Father    Stroke Father     Social History:  reports that she quit smoking about 26 years ago. Her smoking use included cigarettes. She has never used smokeless tobacco. She reports that she does not drink alcohol and does not use drugs.  Allergies:  Allergies  Allergen Reactions   Ace Inhibitors Cough    Other reaction(s): Cough Other reaction(s): Unknown    Furosemide Other (See Comments)    Other reaction(s): Unknown    Hydrochlorothiazide Other (See Comments)   Latex Hives   Rofecoxib Other (See Comments)    Other reaction(s): Unknown    Azithromycin Rash    Medications reviewed.    ROS Full ROS performed and is otherwise negative other than what is stated in HPI   BP 111/70   Pulse 76   Temp 98 F (36.7 C)   Ht 5' 6" (1.676 m)   Wt 155 lb 6.4 oz  (70.5 kg)   SpO2 96%   BMI 25.08 kg/m   Physical Exam Vitals and nursing note reviewed. Exam conducted with a chaperone present.  Constitutional:      General: She is not in acute distress.    Appearance: Normal appearance. She is normal weight.  Cardiovascular:     Rate and Rhythm: Normal rate and regular rhythm.     Heart sounds: No murmur heard. Pulmonary:     Effort: Pulmonary effort is normal. No respiratory distress.     Breath sounds: Normal breath sounds. No stridor.  Abdominal:     General: Abdomen is flat. There is no distension.     Palpations: Abdomen is soft. There is no mass.     Tenderness: There is no abdominal tenderness. There is no left CVA tenderness, guarding or rebound.     Hernia: No hernia is present.  Musculoskeletal:     Cervical back: Normal range of motion and neck supple. No rigidity or tenderness.  Skin:    General: Skin is warm and dry.     Capillary Refill: Capillary refill takes less than 2 seconds.     Coloration: Skin is not jaundiced.  Neurological:     General: No focal   deficit present.     Mental Status: She is alert and oriented to person, place, and time.  Psychiatric:        Mood and Affect: Mood normal.        Behavior: Behavior normal.        Thought Content: Thought content normal.        Judgment: Judgment normal.      Assessment/Plan: [name] is a 75 year old female with history of coronary artery disease and also symptomatic cholelithiasis.  She will definitely require cholecystectomy at some point in time.  She is also having some dizziness which I cannot have a real good explanation.  She wishes me to get an ENT referral and will ask Dr. Jenne Campus to see her. We have already obtained cardiology optimization and they seem to think that she is a reasonable surgical candidate. I discussed the procedure in detail.  The patient was given Agricultural engineer.  We discussed the risks and benefits of a laparoscopic cholecystectomy  and possible cholangiogram including, but not limited to bleeding, infection, injury to surrounding structures such as the intestine or liver, bile leak, retained gallstones, need to convert to an open procedure, prolonged diarrhea, blood clots such as  DVT, common bile duct injury, anesthesia risks, and possible need for additional procedures.  The likelihood of improvement in symptoms and return to the patient's normal status is good. We discussed the typical post-operative recovery course.   Greater than 50% of the 40 minutes  visit was spent in counseling/coordination of care   Sterling Big, MD [facility] General Surgeon

## 2021-04-25 NOTE — Patient Instructions (Addendum)
We will refer you to [facility]. They will call you to schedule this appointment.  You have requested to have your gallbladder removed. This will be done on 05/17/21 at [facility] with Dr. Aleen Campi.  You will most likely be out of work 1-2 weeks for this surgery. You will return after your post-op appointment with a lifting restriction for approximately 4 more weeks.  You will be able to eat anything you would like to following surgery. But, start by eating a bland diet and advance this as tolerated. The Gallbladder diet is below, please go as closely by this diet as possible prior to surgery to avoid any further attacks.  Please see the (blue)pre-care form that you have been given today. If you have any questions, please call our office. Our surgery scheduler will call you to look a surgery dates and to go over information.   Laparoscopic Cholecystectomy Laparoscopic cholecystectomy is surgery to remove the gallbladder. The gallbladder is located in the upper right part of the abdomen, behind the liver. It is a storage sac for bile, which is produced in the liver. Bile aids in the digestion and absorption of fats. Cholecystectomy is often done for inflammation of the gallbladder (cholecystitis). This condition is usually caused by a buildup of gallstones (cholelithiasis) in the gallbladder. Gallstones can block the flow of bile, and that can result in inflammation and pain. In severe cases, emergency surgery may be required. If emergency surgery is not required, you will have time to prepare for the procedure. Laparoscopic surgery is an alternative to open surgery. Laparoscopic surgery has a shorter recovery time. Your common bile duct may also need to be examined during the procedure. If stones are found in the common bile duct, they may be removed. LET [facility] CARE PROVIDER KNOW ABOUT: Any allergies you have. All medicines you are taking, including vitamins, herbs, eye drops, creams,  and over-the-counter medicines. Previous problems you or members of your family have had with the use of anesthetics. Any blood disorders you have. Previous surgeries you have had.  Any medical conditions you have. RISKS AND COMPLICATIONS Generally, this is a safe procedure. However, problems may occur, including: Infection. Bleeding. Allergic reactions to medicines. Damage to other structures or organs. A stone remaining in the common bile duct. A bile leak from the cyst duct that is clipped when your gallbladder is removed. The need to convert to open surgery, which requires a larger incision in the abdomen. This may be necessary if your surgeon thinks that it is not safe to continue with a laparoscopic procedure. BEFORE THE PROCEDURE Ask your health care provider about: Changing or stopping your regular medicines. This is especially important if you are taking diabetes medicines or blood thinners. Taking medicines such as aspirin and ibuprofen. These medicines can thin your blood. Do not take these medicines before your procedure if your health care provider instructs you not to. Follow instructions from your health care provider about eating or drinking restrictions. Let your health care provider know if you develop a cold or an infection before surgery. Plan to have someone take you home after the procedure. Ask your health care provider how your surgical site will be marked or identified. You may be given antibiotic medicine to help prevent infection. PROCEDURE To reduce your risk of infection: Your health care team will wash or sanitize their hands. Your skin will be washed with soap. An IV tube may be inserted into one of your veins. You will  be given a medicine to make you fall asleep (general anesthetic). A breathing tube will be placed in your mouth. The surgeon will make several small cuts (incisions) in your abdomen. A thin, lighted tube (laparoscope) that has a tiny  camera on the end will be inserted through one of the small incisions. The camera on the laparoscope will send a picture to a TV screen (monitor) in the operating room. This will give the surgeon a good view inside your abdomen. A gas will be pumped into your abdomen. This will expand your abdomen to give the surgeon more room to perform the surgery. Other tools that are needed for the procedure will be inserted through the other incisions. The gallbladder will be removed through one of the incisions. After your gallbladder has been removed, the incisions will be closed with stitches (sutures), staples, or skin glue. Your incisions may be covered with a bandage (dressing). The procedure may vary among health care providers and hospitals. AFTER THE PROCEDURE Your blood pressure, heart rate, breathing rate, and blood oxygen level will be monitored often until the medicines you were given have worn off. You will be given medicines as needed to control your pain.   This information is not intended to replace advice given to you by your health care provider. Make sure you discuss any questions you have with your health care provider.   Document Released: 08/21/2005 Document Revised: 05/12/2015 Document Reviewed: 04/02/2013 Elsevier Interactive Patient Education 2016 Else[employer] Inc.   Low-Fat Diet for Gallbladder Conditions A low-fat diet can be helpful if you have pancreatitis or a gallbladder condition. With these conditions, your pancreas and gallbladder have trouble digesting fats. A healthy eating plan with less fat will help rest your pancreas and gallbladder and reduce your symptoms. WHAT DO I NEED TO KNOW ABOUT THIS DIET? Eat a low-fat diet. Reduce your fat intake to less than 20-30% of your total daily calories. This is less than 50-60 g of fat per day. Remember that you need some fat in your diet. Ask your dietician what your daily goal should be. Choose nonfat and low-fat healthy foods.  Look for the words "nonfat," "low fat," or "fat free." As a guide, look on the label and choose foods with less than 3 g of fat per serving. Eat only one serving. Avoid alcohol. Do not smoke. If you need help quitting, talk with your health care provider. Eat small frequent meals instead of three large heavy meals. WHAT FOODS CAN I EAT? Grains Include healthy grains and starches such as potatoes, wheat bread, fiber-rich cereal, and brown rice. Choose whole grain options whenever possible. In adults, whole grains should account for 45-65% of your daily calories.  Fruits and Vegetables Eat plenty of fruits and vegetables. Fresh fruits and vegetables add fiber to your diet. Meats and Other Protein Sources Eat lean meat such as chicken and pork. Trim any fat off of meat before cooking it. Eggs, fish, and beans are other sources of protein. In adults, these foods should account for 10-35% of your daily calories. Dairy Choose low-fat milk and dairy options. Dairy includes fat and protein, as well as calcium.  Fats and Oils Limit high-fat foods such as fried foods, sweets, baked goods, sugary drinks.  Other Creamy sauces and condiments, such as mayonnaise, can add extra fat. Think about whether or not you need to use them, or use smaller amounts or low fat options. WHAT FOODS ARE NOT RECOMMENDED? High fat foods, such as: [employer]  goods. Ice cream. [place] toast. Sweet rolls. Pizza. Cheese bread. Foods covered with batter, butter, creamy sauces, or cheese. Fried foods. Sugary drinks and desserts. Foods that cause gas or bloating   This information is not intended to replace advice given to you by your health care provider. Make sure you discuss any questions you have with your health care provider.   Document Released: 08/26/2013 Document Reviewed: 08/26/2013 Elsevier Interactive Patient Education [employer]

## 2021-04-26 ENCOUNTER — Telehealth: Payer: Self-pay | Admitting: Surgery

## 2021-04-26 NOTE — Telephone Encounter (Signed)
Outgoing call is made again, this time spoke with patient.  She is aware of all dates and information given regarding her surgery for 05/17/21 and verbalized understanding.

## 2021-04-26 NOTE — Telephone Encounter (Signed)
Outgoing call is made, left message to call.  Please inform patient of the following for  Pre-Admission date/time, COVID Testing date and Surgery date.  Surgery Date: 05/17/21 Preadmission Testing Date: 05/06/21 (phone 1p-5p) Covid Testing Date: Not needed.    Also patient will need to call at [phone], between 1-3:00pm the day before surgery, to find out what time to arrive for surgery.

## 2021-05-06 ENCOUNTER — Other Ambulatory Visit
Admission: RE | Admit: 2021-05-06 | Discharge: 2021-05-06 | Disposition: A | Discharge status: Home / Self Care | Payer: Medicare HMO | Source: Ambulatory Visit | Attending: Surgery | Admitting: Surgery

## 2021-05-06 ENCOUNTER — Other Ambulatory Visit: Payer: Self-pay

## 2021-05-06 HISTORY — DX: Atherosclerotic heart disease of native coronary artery without angina pectoris: I25.10

## 2021-05-06 HISTORY — DX: COVID-19: U07.1

## 2021-05-06 HISTORY — DX: Acute myocardial infarction, unspecified: I21.9

## 2021-05-06 HISTORY — DX: Dyspnea, unspecified: R06.00

## 2021-05-06 NOTE — Patient Instructions (Addendum)
Your procedure is scheduled on: 05/17/21 - Tuesday Report to the Registration Desk on the 1st floor of the [facility]. To find out your arrival time, please call [phone] between 1PM - 3PM on: 05/16/21 - Monday  REMEMBER: Instructions that are not followed completely may result in serious medical risk, up to and including death; or upon the discretion of your surgeon and anesthesiologist your surgery may need to be rescheduled.  Do not eat food after midnight the night before surgery.  No gum chewing, lozengers or hard candies.  You may however, drink CLEAR liquids up to 2 hours before you are scheduled to arrive for your surgery. Do not drink anything within 2 hours of your scheduled arrival time.  Clear liquids include: - water  - apple juice without pulp - gatorade (not RED, PURPLE, OR BLUE) - black coffee or tea (Do NOT add milk or creamers to the coffee or tea) Do NOT drink anything that is not on this list.  TAKE THESE MEDICATIONS THE MORNING OF SURGERY WITH A SIP OF WATER:  - albuterol (PROVENTIL) (2.5 MG/3ML) 0.083% nebulizer solution - isosorbide mononitrate (IMDUR) 30 MG 24 hr tablet - omeprazole (PRILOSEC) 20 MG capsule, take one the night before and one on the morning of surgery - helps to prevent nausea after surgery. - albuterol (VENTOLIN HFA) 108 (90 Base) MCG/ACT inhaler , bring to the hospital.  Follow recommendations from Cardiologist, Pulmonologist or PCP regarding stopping Aspirin, Coumadin, Plavix, Eliquis, Pradaxa, or Pletal.  One week prior to surgery: Stop Anti-inflammatories (NSAIDS) such as Advil, Aleve, Ibuprofen, Motrin, Naproxen, Naprosyn and Aspirin based products such as Excedrin, Goodys Powder, BC Powder.  Stop ANY OVER THE COUNTER supplements until after surgery.  You may however, continue to take Tylenol if needed for pain up until the day of surgery.  No Alcohol for 24 hours before or after surgery.  No Smoking including e-cigarettes for  24 hours prior to surgery.  No chewable tobacco products for at least 6 hours prior to surgery.  No nicotine patches on the day of surgery.  Do not use any "recreational" drugs for at least a week prior to your surgery.  Please be advised that the combination of cocaine and anesthesia may have negative outcomes, up to and including death. If you test positive for cocaine, your surgery will be cancelled.  On the morning of surgery brush your teeth with toothpaste and water, you may rinse your mouth with mouthwash if you wish. Do not swallow any toothpaste or mouthwash.  Do not wear jewelry, make-up, hairpins, clips or nail polish.  Do not wear lotions, powders, or perfumes.   Do not shave body from the neck down 48 hours prior to surgery just in case you cut yourself which could leave a site for infection.  Also, freshly shaved skin may become irritated if using the CHG soap.  Contact lenses, hearing aids and dentures may not be worn into surgery.  Do not bring valuables to the hospital. [facility] is not responsible for any missing/lost belongings or valuables.   Use CHG Soap or wipes as directed on instruction sheet.  Notify your doctor if there is any change in your medical condition (cold, fever, infection).  Wear comfortable clothing (specific to your surgery type) to the hospital.  After surgery, you can help prevent lung complications by doing breathing exercises.  Take deep breaths and cough every 1-2 hours. Your doctor may order a device called an Incentive Spirometer to help  you take deep breaths. When coughing or sneezing, hold a pillow firmly against your incision with both hands. This is called "splinting." Doing this helps protect your incision. It also decreases belly discomfort.  If you are being admitted to the hospital overnight, leave your suitcase in the car. After surgery it may be brought to your room.  If you are being discharged the day of surgery, you will  not be allowed to drive home. You will need a responsible adult (18 years or older) to drive you home and stay with you that night.   If you are taking public transportation, you will need to have a responsible adult (18 years or older) with you. Please confirm with your physician that it is acceptable to use public transportation.   Please call the Pre-admissions Testing Dept. at [phone] if you have any questions about these instructions.  Surgery Visitation Policy:  Patients undergoing a surgery or procedure may have one family member or support person with them as long as that person is not COVID-19 positive or experiencing its symptoms.  That person may remain in the waiting area during the procedure.  Inpatient Visitation:    Visiting hours are 7 a.m. to 8 p.m. Inpatients will be allowed two visitors daily. The visitors may change each day during the patient's stay. No visitors under the age of 42. Any visitor under the age of 75 must be accompanied by an adult. The visitor must pass COVID-19 screenings, use hand sanitizer when entering and exiting the patient's room and wear a mask at all times, including in the patient's room. Patients must also wear a mask when staff or their visitor are in the room. Masking is required regardless of vaccination status.

## 2021-05-12 ENCOUNTER — Encounter: Payer: Self-pay | Admitting: Surgery

## 2021-05-12 ENCOUNTER — Telehealth: Payer: Self-pay

## 2021-05-12 NOTE — Progress Notes (Signed)
Perioperative Services  Pre-Admission/Anesthesia Testing Clinical Review  Date: 05/12/21  Patient Demographics:  Name: Tina Fields DOB:   1945-11-04 MRN:   [id number]  Planned Surgical Procedure(s):    Case: [id number] Date/Time: 05/17/21 1336   Procedure: XI ROBOTIC ASSISTED LAPAROSCOPIC CHOLECYSTECTOMY   Anesthesia type: General   Pre-op diagnosis: biliary colic   Location: [facility] OR ROOM 06 / [facility] ORS FOR ANESTHESIA GROUP   Surgeons: Leafy Ro, MD   NOTE: Available PAT nursing documentation and vital signs have been reviewed. Clinical nursing staff has updated patient's PMH/PSHx, current medication list, and drug allergies/intolerances to ensure comprehensive history available to assist in medical decision making as it pertains to the aforementioned surgical procedure and anticipated anesthetic course. Extensive review of available clinical information performed. [place] PMH and PSHx updated with any diagnoses/procedures that  may have been inadvertently omitted during her intake with the pre-admission testing department's nursing staff.  Clinical Discussion:  Tina Fields is a 75 y.o. female who is submitted for pre-surgical anesthesia review and clearance prior to her undergoing the above procedure. Patient is a Former Smoker (quit 01/1995). Pertinent PMH includes: CAD, MI, HFrEF, aortic atherosclerosis, HTN, HLD, GERD (on daily PPI), OSAH (no nocturnal PAP therapy), asthma, shortness of breath, peripheral edema, RA.  Patient is followed by cardiology Novella Olive, MD). She was last seen in the cardiology clinic on 03/15/2021; notes reviewed.  At the time of her clinic visit, patient was being seen in consult following hospitalization in February and March due to her known heart failure.  Patient clinically improved following hospitalizations.  She denied any episodes of chest pain, increased shortness of breath, PND, orthopnea, significant peripheral edema,  palpitations, vertiginous symptoms, or presyncope/syncope.  PMH significant for cardiovascular diagnoses.  Patient with a remote history of MI in the past (date unknown).  Patient underwent diagnostic left heart catheterization in 06/2005 that revealed moderate ostial LCx and LAD disease.  LVEF reduced to 45%.  Medical management was recommended.  Repeat cardiac catheterization performed in 05/2008 revealing no significant/occlusive CAD.  LVEF had improved to 65%.  Medical management of symptoms recommended.  Myocardial perfusion imaging study performed on 06/10/2014 revealed an LVEF of 62%.  There were no regional wall motion abnormalities.  There was no evidence of stress-induced myocardial ischemia or arrhythmia.  Subsequent TTE revealed globally normal systolic function with an EF of >55%.  TTE performed on 10/28/2020 revealed severely decreased left ventricular systolic function with an EF 30-35%.  Diastolic parameters consistent with G2DD.  Repeat myocardial perfusion imaging study was performed on 02/23/2021 demonstrating a normal left ventricular systolic function with an EF of 58%.  There was significant hypokinesis of the lateral myocardium.  Additionally, there was a large perfusion defect compatible with infarct and minimal peri-infarct ischemia.  Repeat echocardiogram was performed on 05/03/2021 revealing a mildly decreased left ventricular systolic function with an estimated EF of 45-50%.  Diastolic parameters consistent with grade 1 diastolic dysfunction.  There was no evidence of significant valvular regurgitation or stenosis.  No pulmonary hypertension.  HFrEF and blood pressure well controlled on currently prescribed ARB/ARNi, diuretic, and nitrate therapies; blood pressure 116/66.  Patient is on a statin for her HLD.  She is not diabetic.  Patient has and OSAH diagnosis, however she is noncompliant with her prescribed nocturnal PAP therapy. Functional capacity limited by underlying  HFrEF and reported orthopedic pain.  She is currently unable to achieve 4 METS of activity.  During admission for heart failure back in  the early part of 2022, beta-blocker therapy was discontinued due to hypotension in the setting of severely reduced EF.  Recent noninvasive imaging has demonstrated improvement in the patient's EF.  Beta-blocker restarted at previous dose (metoprolol succinate 25 mg daily).  No other changes were made to patient's medication regimen.  Patient to follow-up with outpatient cardiology in 3 months or sooner if needed.  [name] is scheduled to undergo an elective robot-assisted laparoscopic cholecystectomy on 05/17/2021 with Dr. Sterling Big, MD.  Given patient's past medical history significant for cardiovascular disease, presurgical cardiac clearance was sought by the performing surgeon's office and PAT team. Per cardiology, "this patient is optimized for surgery and may proceed with the planned procedural course with a MODERATE risk of significant perioperative cardiovascular complications".  This patient is on daily antiplatelet therapy.  Recommendations from cardiology are for patient to continue this medication throughout the perioperative period.  Patient denies previous perioperative complications with anesthesia in the past.  In review of this patient's EMR, there are no records available for review pertaining to past procedural/anesthetic courses within the [facility] system.  Vitals with BMI 04/25/2021 03/21/2021 03/04/2021  Height 5\' 6"  5\' 6"  -  Weight 155 lbs 6 oz 160 lbs 6 oz -  BMI 25.09 25.9 -  Systolic 111 104 734  Diastolic 70 68 93  Pulse 76 104 67    Providers/Specialists:   NOTE: Primary physician provider listed below. Patient may have been seen by APP or partner within same practice.   PROVIDER ROLE / SPECIALTY LAST Cherly Hensen, MD General Surgery 04/25/2021  Oswaldo Conroy, MD Primary Care Provider 11/09/2020   Lorretta Harp, MD Cardiology 05/03/2021   Allergies:  Ace inhibitors, Hydrochlorothiazide, Latex, and Azithromycin  Current Home Medications:   No current facility-administered medications for this encounter.    albuterol (PROVENTIL) (2.5 MG/3ML) 0.083% nebulizer solution   albuterol (VENTOLIN HFA) 108 (90 Base) MCG/ACT inhaler   aspirin EC 81 MG EC tablet   atorvastatin (LIPITOR) 80 MG tablet   budesonide-formoterol (SYMBICORT) 80-4.5 MCG/ACT inhaler   [employer] VITAMIN B12 1000 MCG tablet   ENTRESTO 24-26 MG   fluticasone (FLONASE) 50 MCG/ACT nasal spray   furosemide (LASIX) 40 MG tablet   isosorbide mononitrate (IMDUR) 30 MG 24 hr tablet   metoprolol succinate (TOPROL-XL) 25 MG 24 hr tablet   MIRALAX 17 GM/SCOOP powder   montelukast (SINGULAIR) 10 MG tablet   omeprazole (PRILOSEC) 20 MG capsule   Vitamin D, Ergocalciferol, (DRISDOL) 1.25 MG (50000 UNIT) CAPS capsule   cyclobenzaprine (FLEXERIL) 5 MG tablet   spironolactone (ALDACTONE) 25 MG tablet   History:   Past Medical History:  Diagnosis Date   Aortic atherosclerosis (HCC)    Asthma    Coronary artery disease    Dyspnea    Edema of both legs    GERD (gastroesophageal reflux disease)    HFrEF (heart failure with reduced ejection fraction) (HCC)    History of 2019 novel coronavirus disease (COVID-19) 10/28/2020   Hypertension    Myocardial infarction (HCC)    OSA (obstructive sleep apnea)    does not use nocturnal PAP therapy   RA (rheumatoid arthritis) (HCC)    Past Surgical History:  Procedure Laterality Date   BREAST SURGERY     CARDIAC CATHETERIZATION Left 06/21/2005   Moderate ostial LCx and LAD disease; LVEF 45%; Location: [facility]; Surgeon: Adrian Blackwater, MD   CARDIAC CATHETERIZATION Left 05/20/2008   No occlusive CAD; LVEF 65%; Location:  [facility]; Surgeon: Rudean Hitt, MD   COLONOSCOPY     ESOPHAGOGASTRODUODENOSCOPY     TUBAL LIGATION     Family History  Problem Relation Age of Onset   CAD Mother     Diabetes Mother    Hypertension Mother    CAD Father    Diabetes Father    Hypertension Father    Stroke Father    Social History   Tobacco Use   Smoking status: Former    Types: Cigarettes    Quit date: 01/09/1995    Years since quitting: 26.3   Smokeless tobacco: Never  Vaping Use   Vaping Use: Never used  Substance Use Topics   Alcohol use: No   Drug use: No    Pertinent Clinical Results:  LABS: Labs reviewed: Acceptable for surgery.  Lab Results  Component Value Date   WBC 5.8 03/03/2021   HGB 10.8 (L) 03/03/2021   HCT 32.0 (L) 03/03/2021   MCV 93.8 03/03/2021   PLT 352 03/03/2021   Lab Results  Component Value Date   NA 138 03/04/2021   K 3.8 03/04/2021   CO2 27 03/04/2021   GLUCOSE 128 (H) 03/04/2021   BUN 16 03/04/2021   CREATININE 0.80 03/04/2021   CALCIUM 8.7 (L) 03/04/2021   GFRNONAA >60 03/04/2021   GFRAA >60 10/04/2019    ECG: Date: 03/04/2021 Time ECG obtained: 0524 AM Rate: 78 bpm Rhythm:  Sinus rhythm with marked sinus arrhythmia and occasional PVCs Axis (leads I and aVF): Normal Intervals: PR 150 ms. QRS 88 ms. QTc 465 ms. ST segment and T wave changes: Nonspecific ST and T wave abnormalities  Comparison: Similar to previous tracing obtained on 01/18/2021   IMAGING / PROCEDURES: TRANSTHORACIC ECHOCARDIOGRAM performed on 05/03/2021 LVEF 45-50% Left ventricular systolic function mildly decreased Left ventricle is normal in size with normal wall thickness There is decreased contractile function involving the apical, mid anterolateral, mid inferolateral, and mid inferior segments There is grade 1 diastolic dysfunction The mitral valve leaflets are mildly thickened with normal leaflet mobility The aortic valve is trileaflet with mildly thickened leaflets with normal excursion Left atrium is moderately dilated in size The right ventricle is normal in size with normal systolic function There is no pulmonary hypertension  LEXISCAN performed  on 02/23/2021 The left ventricular ejection fraction is calculated at stress at 58% Lateral left ventricular wall is significantly hypokinetic Significant coronary and aortic calcifications Large in size, severe, minimal defect involving the apical, apical anterior, apical inferior, apical lateral, mid anterolateral, mid inferolateral, basal and lateral, and basal inferolateral segments compatible with infarct and minimal peri-infarct ischemia Large RIGHT renal mass Smaller LEFT renal masses Small hypodense liver lesions Grossly unchanged RIGHT upper lobe pleural-based density with subcentimeter RIGHT apical lung nodule when compared to CTA dated 08/05/2016.  TRANSTHORACIC ECHOCARDIOGRAM performed on 10/28/2020 Left ventricular systolic function severely decreased with an EF of 30-35% Left ventricle is normal in size with normal wall thickness There is grade 2 diastolic dysfunction The aortic valve is trileaflet with mildly thickened leaflets with mildly reduced excursion The left atrium is mildly dilated in size The right ventricle is normal in size with normal systolic function  Impression and Plan:  Raissa Dam has been referred for pre-anesthesia review and clearance prior to her undergoing the planned anesthetic and procedural courses. Available labs, pertinent testing, and imaging results were personally reviewed by me. This patient has been appropriately cleared by cardiology with an overall MODERATE risk of significant perioperative cardiovascular complications.  Based on clinical review performed today (05/12/21), barring any significant acute changes in the patient's overall condition, it is anticipated that she will be able to proceed with the planned surgical intervention. Any acute changes in clinical condition may necessitate her procedure being postponed and/or cancelled. Patient will meet with anesthesia team (MD and/or CRNA) on the day of her procedure for preoperative  evaluation/assessment. Questions regarding anesthetic course will be fielded at that time.   Pre-surgical instructions were reviewed with the patient during her PAT appointment and questions were fielded by PAT clinical staff. Patient was advised that if any questions or concerns arise prior to her procedure then she should return a call to PAT and/or her surgeon's office to discuss.  Quentin Mulling, MSN, APRN, FNP-C, CEN [facility]  Peri-operative Services Nurse Practitioner Phone: [phone] Fax: [phone] 05/12/21 9:56 AM  NOTE: This note has been prepared using Dragon dictation software. Despite my best ability to proofread, there is always the potential that unintentional transcriptional errors may still occur from this process.

## 2021-05-12 NOTE — Telephone Encounter (Signed)
Cardiac Clearance received from Dr.Cassandra Ramm-she is intermediate risk surgery.She is on appropriate medical therapy.Should continue Aspirin,Toprol throughout perioperative period. Her EF has improved from 30-35% to now 45-50%.

## 2021-05-17 ENCOUNTER — Encounter: Payer: Self-pay | Admitting: Surgery

## 2021-05-17 ENCOUNTER — Other Ambulatory Visit: Payer: Self-pay

## 2021-05-17 ENCOUNTER — Ambulatory Visit
Admission: RE | Admit: 2021-05-17 | Discharge: 2021-05-17 | Disposition: A | Discharge status: Home / Self Care | Payer: Medicare HMO | Attending: Surgery | Admitting: Surgery

## 2021-05-17 ENCOUNTER — Encounter
Admission: RE | Disposition: A | Discharge status: Home / Self Care | Payer: Self-pay | Source: Home / Self Care | Attending: Surgery

## 2021-05-17 ENCOUNTER — Ambulatory Visit: Payer: Medicare HMO | Admitting: Urgent Care

## 2021-05-17 DIAGNOSIS — E785 Hyperlipidemia, unspecified: Secondary | ICD-10-CM | POA: Diagnosis not present

## 2021-05-17 DIAGNOSIS — M069 Rheumatoid arthritis, unspecified: Secondary | ICD-10-CM | POA: Diagnosis not present

## 2021-05-17 DIAGNOSIS — Z888 Allergy status to other drugs, medicaments and biological substances status: Secondary | ICD-10-CM | POA: Diagnosis not present

## 2021-05-17 DIAGNOSIS — K8020 Calculus of gallbladder without cholecystitis without obstruction: Secondary | ICD-10-CM

## 2021-05-17 DIAGNOSIS — Z8616 Personal history of COVID-19: Secondary | ICD-10-CM | POA: Diagnosis not present

## 2021-05-17 DIAGNOSIS — I5022 Chronic systolic (congestive) heart failure: Secondary | ICD-10-CM | POA: Diagnosis not present

## 2021-05-17 DIAGNOSIS — Z881 Allergy status to other antibiotic agents status: Secondary | ICD-10-CM | POA: Insufficient documentation

## 2021-05-17 DIAGNOSIS — I2510 Atherosclerotic heart disease of native coronary artery without angina pectoris: Secondary | ICD-10-CM | POA: Diagnosis not present

## 2021-05-17 DIAGNOSIS — K8044 Calculus of bile duct with chronic cholecystitis without obstruction: Secondary | ICD-10-CM

## 2021-05-17 DIAGNOSIS — Z79899 Other long term (current) drug therapy: Secondary | ICD-10-CM | POA: Insufficient documentation

## 2021-05-17 DIAGNOSIS — Z91040 Latex allergy status: Secondary | ICD-10-CM | POA: Diagnosis not present

## 2021-05-17 DIAGNOSIS — Z87891 Personal history of nicotine dependence: Secondary | ICD-10-CM | POA: Insufficient documentation

## 2021-05-17 DIAGNOSIS — I252 Old myocardial infarction: Secondary | ICD-10-CM | POA: Insufficient documentation

## 2021-05-17 DIAGNOSIS — K8060 Calculus of gallbladder and bile duct with cholecystitis, unspecified, without obstruction: Secondary | ICD-10-CM | POA: Diagnosis present

## 2021-05-17 DIAGNOSIS — I110 Hypertensive heart disease with heart failure: Secondary | ICD-10-CM | POA: Diagnosis not present

## 2021-05-17 HISTORY — DX: Obstructive sleep apnea (adult) (pediatric): G47.33

## 2021-05-17 HISTORY — DX: Atherosclerosis of aorta: I70.0

## 2021-05-17 HISTORY — DX: Gastro-esophageal reflux disease without esophagitis: K21.9

## 2021-05-17 HISTORY — DX: Unspecified systolic (congestive) heart failure: I50.20

## 2021-05-17 SURGERY — CHOLECYSTECTOMY, ROBOT-ASSISTED, LAPAROSCOPIC
Anesthesia: General

## 2021-05-17 MED ORDER — HYDROCODONE-ACETAMINOPHEN 5-325 MG PO TABS: 1.0000 | ORAL_TABLET | ORAL | 0 refills | Status: DC | PRN

## 2021-05-17 MED ORDER — OXYCODONE HCL 5 MG PO TABS
5.0000 mg | ORAL_TABLET | Freq: Once | ORAL | Status: AC | PRN
  Administered 2021-05-17: 5 mg via ORAL

## 2021-05-17 MED ORDER — ACETAMINOPHEN 500 MG PO TABS: 1000.0000 mg | ORAL_TABLET | ORAL | Status: AC

## 2021-05-17 MED ORDER — DEXMEDETOMIDINE (PRECEDEX) IN NS 20 MCG/5ML (4 MCG/ML) IV SYRINGE
PREFILLED_SYRINGE | INTRAVENOUS | Status: DC | PRN
  Administered 2021-05-17: 8 ug via INTRAVENOUS

## 2021-05-17 MED ORDER — ORAL CARE MOUTH RINSE: 15.0000 mL | Freq: Once | OROMUCOSAL | Status: AC

## 2021-05-17 MED ORDER — FENTANYL CITRATE (PF) 100 MCG/2ML IJ SOLN
INTRAMUSCULAR | Status: AC
  Filled 2021-05-17: qty 2

## 2021-05-17 MED ORDER — CEFAZOLIN SODIUM-DEXTROSE 2-4 GM/100ML-% IV SOLN
INTRAVENOUS | Status: AC
  Filled 2021-05-17: qty 100

## 2021-05-17 MED ORDER — FENTANYL CITRATE (PF) 100 MCG/2ML IJ SOLN
25.0000 ug | INTRAMUSCULAR | Status: DC | PRN
  Administered 2021-05-17: 25 ug via INTRAVENOUS

## 2021-05-17 MED ORDER — OXYCODONE HCL 5 MG/5ML PO SOLN
5.0000 mg | Freq: Once | ORAL | Status: AC | PRN
Start: 2021-05-17 — End: 2021-05-17

## 2021-05-17 MED ORDER — CHLORHEXIDINE GLUCONATE CLOTH 2 % EX PADS
6.0000 | MEDICATED_PAD | Freq: Once | CUTANEOUS | Status: AC
  Administered 2021-05-17: 6 via TOPICAL

## 2021-05-17 MED ORDER — PROPOFOL 10 MG/ML IV BOLUS
INTRAVENOUS | Status: AC
  Filled 2021-05-17: qty 20

## 2021-05-17 MED ORDER — 0.9 % SODIUM CHLORIDE (POUR BTL) OPTIME
TOPICAL | Status: DC | PRN
  Administered 2021-05-17: 100 mL

## 2021-05-17 MED ORDER — BUPIVACAINE-EPINEPHRINE 0.25% -1:200000 IJ SOLN
INTRAMUSCULAR | Status: DC | PRN
  Administered 2021-05-17: 50 mL

## 2021-05-17 MED ORDER — SUGAMMADEX SODIUM 200 MG/2ML IV SOLN
INTRAVENOUS | Status: DC | PRN
  Administered 2021-05-17: 200 mg via INTRAVENOUS

## 2021-05-17 MED ORDER — LIDOCAINE HCL (CARDIAC) PF 100 MG/5ML IV SOSY
PREFILLED_SYRINGE | INTRAVENOUS | Status: DC | PRN
  Administered 2021-05-17: 50 mg via INTRAVENOUS

## 2021-05-17 MED ORDER — INDOCYANINE GREEN 25 MG IV SOLR
25.0000 mg | Freq: Once | INTRAVENOUS | Status: DC
  Filled 2021-05-17: qty 10

## 2021-05-17 MED ORDER — ROCURONIUM BROMIDE 10 MG/ML (PF) SYRINGE
PREFILLED_SYRINGE | INTRAVENOUS | Status: AC
  Filled 2021-05-17: qty 10

## 2021-05-17 MED ORDER — CEFAZOLIN SODIUM-DEXTROSE 2-4 GM/100ML-% IV SOLN
2.0000 g | INTRAVENOUS | Status: AC
  Administered 2021-05-17: 2 g via INTRAVENOUS

## 2021-05-17 MED ORDER — LACTATED RINGERS IV SOLN: INTRAVENOUS | Status: DC

## 2021-05-17 MED ORDER — GLYCOPYRROLATE 0.2 MG/ML IJ SOLN
INTRAMUSCULAR | Status: DC | PRN
  Administered 2021-05-17: .2 mg via INTRAVENOUS

## 2021-05-17 MED ORDER — DEXAMETHASONE SODIUM PHOSPHATE 10 MG/ML IJ SOLN
INTRAMUSCULAR | Status: DC | PRN
  Administered 2021-05-17: 10 mg via INTRAVENOUS

## 2021-05-17 MED ORDER — DEXAMETHASONE SODIUM PHOSPHATE 10 MG/ML IJ SOLN
INTRAMUSCULAR | Status: AC
  Filled 2021-05-17: qty 1

## 2021-05-17 MED ORDER — ACETAMINOPHEN 500 MG PO TABS
ORAL_TABLET | ORAL | Status: AC
  Administered 2021-05-17: 1000 mg via ORAL
  Filled 2021-05-17: qty 2

## 2021-05-17 MED ORDER — FENTANYL CITRATE (PF) 100 MCG/2ML IJ SOLN
INTRAMUSCULAR | Status: DC | PRN
  Administered 2021-05-17 (×2): 50 ug via INTRAVENOUS

## 2021-05-17 MED ORDER — DEXMEDETOMIDINE (PRECEDEX) IN NS 20 MCG/5ML (4 MCG/ML) IV SYRINGE
PREFILLED_SYRINGE | INTRAVENOUS | Status: AC
  Filled 2021-05-17: qty 5

## 2021-05-17 MED ORDER — ONDANSETRON HCL 4 MG/2ML IJ SOLN
INTRAMUSCULAR | Status: DC | PRN
Start: 2021-05-17 — End: 2021-05-17
  Administered 2021-05-17: 4 mg via INTRAVENOUS

## 2021-05-17 MED ORDER — LIDOCAINE HCL (PF) 2 % IJ SOLN
INTRAMUSCULAR | Status: AC
  Filled 2021-05-17: qty 5

## 2021-05-17 MED ORDER — BUPIVACAINE-EPINEPHRINE (PF) 0.25% -1:200000 IJ SOLN
INTRAMUSCULAR | Status: AC
  Filled 2021-05-17: qty 30

## 2021-05-17 MED ORDER — INDOCYANINE GREEN 25 MG IV SOLR
2.5000 mg | Freq: Once | INTRAVENOUS | Status: AC
  Administered 2021-05-17: 2.5 mg via INTRAVENOUS
  Filled 2021-05-17: qty 1

## 2021-05-17 MED ORDER — PROPOFOL 10 MG/ML IV BOLUS
INTRAVENOUS | Status: DC | PRN
  Administered 2021-05-17: 100 mg via INTRAVENOUS

## 2021-05-17 MED ORDER — GLYCOPYRROLATE 0.2 MG/ML IJ SOLN
INTRAMUSCULAR | Status: AC
  Filled 2021-05-17: qty 1

## 2021-05-17 MED ORDER — BUPIVACAINE LIPOSOME 1.3 % IJ SUSP
INTRAMUSCULAR | Status: AC
  Filled 2021-05-17: qty 20

## 2021-05-17 MED ORDER — ROCURONIUM BROMIDE 100 MG/10ML IV SOLN
INTRAVENOUS | Status: DC | PRN
  Administered 2021-05-17: 50 mg via INTRAVENOUS

## 2021-05-17 MED ORDER — GABAPENTIN 300 MG PO CAPS
ORAL_CAPSULE | ORAL | Status: AC
  Administered 2021-05-17: 300 mg via ORAL
  Filled 2021-05-17: qty 1

## 2021-05-17 MED ORDER — CHLORHEXIDINE GLUCONATE 0.12 % MT SOLN
15.0000 mL | Freq: Once | OROMUCOSAL | Status: AC
  Administered 2021-05-17: 15 mL via OROMUCOSAL

## 2021-05-17 MED ORDER — CHLORHEXIDINE GLUCONATE 0.12 % MT SOLN
OROMUCOSAL | Status: AC
  Filled 2021-05-17: qty 15

## 2021-05-17 MED ORDER — OXYCODONE HCL 5 MG PO TABS
ORAL_TABLET | ORAL | Status: AC
  Filled 2021-05-17: qty 1

## 2021-05-17 MED ORDER — PHENYLEPHRINE HCL (PRESSORS) 10 MG/ML IV SOLN
INTRAVENOUS | Status: AC
  Filled 2021-05-17: qty 1

## 2021-05-17 MED ORDER — GABAPENTIN 300 MG PO CAPS: 300.0000 mg | ORAL_CAPSULE | ORAL | Status: AC

## 2021-05-17 SURGICAL SUPPLY — 50 items
ADH SKN CLS APL DERMABOND .7 (GAUZE/BANDAGES/DRESSINGS) ×1
APL PRP STRL LF DISP 70% ISPRP (MISCELLANEOUS) ×1
BAG SPEC RTRVL LRG 6X4 10 (ENDOMECHANICALS) ×1
CANNULA REDUC XI 12-8 STAPL (CANNULA) ×1
CANNULA REDUCER 12-8 DVNC XI (CANNULA) ×1 IMPLANT
CHLORAPREP W/TINT 26 (MISCELLANEOUS) ×2 IMPLANT
CLIP LIGATING HEMO O LOK GREEN (MISCELLANEOUS) ×2 IMPLANT
DEFOGGER SCOPE WARMER CLEARIFY (MISCELLANEOUS) ×2 IMPLANT
DERMABOND ADVANCED (GAUZE/BANDAGES/DRESSINGS) ×1
DERMABOND ADVANCED .7 DNX12 (GAUZE/BANDAGES/DRESSINGS) ×1 IMPLANT
DRAPE ARM DVNC X/XI (DISPOSABLE) ×4 IMPLANT
DRAPE COLUMN DVNC XI (DISPOSABLE) ×1 IMPLANT
DRAPE DA VINCI XI ARM (DISPOSABLE) ×4
DRAPE DA VINCI XI COLUMN (DISPOSABLE) ×1
ELECT CAUTERY BLADE 6.4 (BLADE) ×2 IMPLANT
ELECT REM PT RETURN 9FT ADLT (ELECTROSURGICAL) ×2
ELECTRODE REM PT RTRN 9FT ADLT (ELECTROSURGICAL) ×1 IMPLANT
GLOVE SURG ENC MOIS LTX SZ7 (GLOVE) ×4 IMPLANT
GOWN STRL REUS W/ TWL LRG LVL3 (GOWN DISPOSABLE) ×4 IMPLANT
GOWN STRL REUS W/TWL LRG LVL3 (GOWN DISPOSABLE) ×8
IRRIGATION STRYKERFLOW (MISCELLANEOUS) IMPLANT
IRRIGATOR STRYKERFLOW (MISCELLANEOUS)
IV NS 1000ML (IV SOLUTION)
IV NS 1000ML BAXH (IV SOLUTION) IMPLANT
KIT PINK PAD W/HEAD ARE REST (MISCELLANEOUS) ×2
KIT PINK PAD W/HEAD ARM REST (MISCELLANEOUS) ×1 IMPLANT
LABEL OR SOLS (LABEL) ×2 IMPLANT
MANIFOLD NEPTUNE II (INSTRUMENTS) ×2 IMPLANT
NEEDLE HYPO 22GX1.5 SAFETY (NEEDLE) ×2 IMPLANT
NS IRRIG 500ML POUR BTL (IV SOLUTION) ×2 IMPLANT
OBTURATOR OPTICAL STANDARD 8MM (TROCAR) ×1
OBTURATOR OPTICAL STND 8 DVNC (TROCAR) ×1
OBTURATOR OPTICALSTD 8 DVNC (TROCAR) ×1 IMPLANT
PACK LAP CHOLECYSTECTOMY (MISCELLANEOUS) ×2 IMPLANT
PENCIL ELECTRO HAND CTR (MISCELLANEOUS) ×2 IMPLANT
POUCH SPECIMEN RETRIEVAL 10MM (ENDOMECHANICALS) ×2 IMPLANT
SEAL CANN UNIV 5-8 DVNC XI (MISCELLANEOUS) ×3 IMPLANT
SEAL XI 5MM-8MM UNIVERSAL (MISCELLANEOUS) ×3
SET TUBE SMOKE EVAC HIGH FLOW (TUBING) ×2 IMPLANT
SOLUTION ELECTROLUBE (MISCELLANEOUS) ×2 IMPLANT
SPONGE T-LAP 18X18 ~~LOC~~+RFID (SPONGE) ×2 IMPLANT
STAPLER CANNULA SEAL DVNC XI (STAPLE) ×1 IMPLANT
STAPLER CANNULA SEAL XI (STAPLE) ×1
SUT MNCRL AB 4-0 PS2 18 (SUTURE) ×2 IMPLANT
SUT VICRYL 0 AB UR-6 (SUTURE) ×4 IMPLANT
SYR 20ML LL LF (SYRINGE) ×2 IMPLANT
SYR 30ML LL (SYRINGE) ×2 IMPLANT
TAPE TRANSPORE STRL 2 31045 (GAUZE/BANDAGES/DRESSINGS) ×2 IMPLANT
TROCAR BALLN GELPORT 12X130M (ENDOMECHANICALS) ×2 IMPLANT
WATER STERILE IRR 500ML POUR (IV SOLUTION) ×2 IMPLANT

## 2021-05-17 NOTE — Op Note (Signed)
Robotic assisted laparoscopic Cholecystectomy  Pre-operative Diagnosis: biliary colic  Post-operative Diagnosis: same  Procedure:  Robotic assisted laparoscopic Cholecystectomy  Surgeon: Sterling Big, MD FACS  Anesthesia: Gen. with endotracheal tube  Findings: Chronic Cholecystitis   Estimated Blood Loss:5 cc       Specimens: Gallbladder           Complications: none   Procedure Details  The patient was seen again in the Holding Room. The benefits, complications, treatment options, and expected outcomes were discussed with the patient. The risks of bleeding, infection, recurrence of symptoms, failure to resolve symptoms, bile duct damage, bile duct leak, retained common bile duct stone, bowel injury, any of which could require further surgery and/or ERCP, stent, or papillotomy were reviewed with the patient. The likelihood of improving the patient's symptoms with return to their baseline status is good.  The patient and/or family concurred with the proposed plan, giving informed consent.  The patient was taken to Operating Room, identified  and the procedure verified as Laparoscopic Cholecystectomy.  A Time Out was held and the above information confirmed.  Prior to the induction of general anesthesia, antibiotic prophylaxis was administered. VTE prophylaxis was in place. General endotracheal anesthesia was then administered and tolerated well. After the induction, the abdomen was prepped with Chloraprep and draped in the sterile fashion. The patient was positioned in the supine position.  Cut down technique was used to enter the abdominal cavity and a Hasson trochar was placed after two vicryl stitches were anchored to the fascia. Pneumoperitoneum was then created with CO2 and tolerated well without any adverse changes in the patient's vital signs.  Three 8-mm ports were placed under direct vision. All skin incisions  were infiltrated with a local anesthetic agent before making the  incision and placing the trocars.   The patient was positioned  in reverse Trendelenburg, robot was brought to the surgical field and docked in the standard fashion.  We made sure all the instrumentation was kept indirect view at all times and that there were no collision between the arms. I scrubbed out and went to the console.  The gallbladder was identified, the fundus grasped and retracted cephalad. Adhesions were lysed bluntly. The infundibulum was grasped and retracted laterally, exposing the peritoneum overlying the triangle of Calot. This was then divided and exposed in a blunt fashion. An extended critical view of the cystic duct and cystic artery was obtained.  The cystic duct was clearly identified and bluntly dissected.   Artery and duct were double clipped and divided. Using ICG cholangiography we visualize the cystic duct and CBD no evidence of bile injuries were encountered. The gallbladder was taken from the gallbladder fossa in a retrograde fashion with the electrocautery.  Hemostasis was achieved with the electrocautery. nspection of the right upper quadrant was performed. No bleeding, bile duct injury or leak, or bowel injury was noted. Robotic instruments and robotic arms were undocked in the standard fashion.  I scrubbed back in.  The gallbladder was removed and placed in an Endocatch bag.   Pneumoperitoneum was released.  The periumbilical port site was closed with interrumpted 0 Vicryl sutures. 4-0 subcuticular Monocryl was used to close the skin. Dermabond was  applied.  The patient was then extubated and brought to the recovery room in stable condition. Sponge, lap, and needle counts were correct at closure and at the conclusion of the case.               Sterling Big, MD,  FACS

## 2021-05-17 NOTE — Anesthesia Procedure Notes (Signed)
Procedure Name: Intubation Date/Time: 05/17/2021 10:06 AM Performed by: Rolla Plate, CRNA Pre-anesthesia Checklist: Patient identified, Patient being monitored, Timeout performed, Emergency Drugs available and Suction available Patient Re-evaluated:Patient Re-evaluated prior to induction Oxygen Delivery Method: Circle system utilized Preoxygenation: Pre-oxygenation with 100% oxygen Induction Type: IV induction Ventilation: Mask ventilation without difficulty Laryngoscope Size: Mac and 3 Grade View: Grade I Tube type: Oral Tube size: 7.0 mm Number of attempts: 1 Airway Equipment and Method: Stylet Placement Confirmation: ETT inserted through vocal cords under direct vision, positive ETCO2 and breath sounds checked- equal and bilateral Secured at: 21 cm Tube secured with: Tape Dental Injury: Teeth and Oropharynx as per pre-operative assessment

## 2021-05-17 NOTE — Transfer of Care (Addendum)
Immediate Anesthesia Transfer of Care Note  Patient: Tina Fields  Procedure(s) Performed: XI ROBOTIC ASSISTED LAPAROSCOPIC CHOLECYSTECTOMY  Patient Location: PACU  Anesthesia Type:General  Level of Consciousness: drowsy  Airway & Oxygen Therapy: Patient Spontanous Breathing and Patient connected to face mask oxygen  Post-op Assessment: Report given to RN and Post -op Vital signs reviewed and stable  Post vital signs: Reviewed  Last Vitals:  Vitals Value Taken Time  BP 138/81 05/17/21 1100  Temp    Pulse 102 05/17/21 1101  Resp 45 05/17/21 1101  SpO2 98 % 05/17/21 1101  Vitals shown include unvalidated device data.  Last Pain:  Vitals:   05/17/21 0849  TempSrc: Tympanic  PainSc: 0-No pain      Patients Stated Pain Goal: 0 (05/17/21 0849)  Complications: No notable events documented.

## 2021-05-17 NOTE — Interval H&P Note (Signed)
History and Physical Interval Note:  05/17/2021 9:21 AM  Tina Fields  has presented today for surgery, with the diagnosis of biliary colie.  The various methods of treatment have been discussed with the patient and family. After consideration of risks, benefits and other options for treatment, the patient has consented to  Procedure(s): XI ROBOTIC ASSISTED LAPAROSCOPIC CHOLECYSTECTOMY (N/A) as a surgical intervention.  The patient's history has been reviewed, patient examined, no change in status, stable for surgery.  I have reviewed the patient's chart and labs.  Questions were answered to the patient's satisfaction.     Lashannon Bresnan F Bayley Hurn

## 2021-05-17 NOTE — Anesthesia Preprocedure Evaluation (Signed)
Anesthesia Evaluation  Patient identified by MRN, date of birth, ID band Patient awake    Reviewed: Allergy & Precautions, NPO status , Patient's Chart, lab work & pertinent test results  History of Anesthesia Complications Negative for: history of anesthetic complications  Airway Mallampati: III  TM Distance: <3 FB Neck ROM: limited    Dental  (+) Chipped, Poor Dentition, Missing   Pulmonary shortness of breath and with exertion, asthma , sleep apnea , COPD, former smoker,    Pulmonary exam normal        Cardiovascular hypertension, (-) angina+ CAD, + Past MI and +CHF  Normal cardiovascular exam     Neuro/Psych negative neurological ROS  negative psych ROS   GI/Hepatic Neg liver ROS, GERD  Medicated and Controlled,  Endo/Other  negative endocrine ROS  Renal/GU      Musculoskeletal  (+) Arthritis ,   Abdominal   Peds  Hematology negative hematology ROS (+)   Anesthesia Other Findings Patient has cardiac clearance for this procedure.   Past Medical History: No date: Aortic atherosclerosis (HCC) No date: Asthma No date: Coronary artery disease No date: Dyspnea No date: Edema of both legs No date: GERD (gastroesophageal reflux disease) No date: HFrEF (heart failure with reduced ejection fraction) (HCC) 10/28/2020: History of 2019 novel coronavirus disease (COVID-19) No date: Hypertension No date: Myocardial infarction (HCC) No date: OSA (obstructive sleep apnea)     Comment:  noncompliant with nocturnal PAP therapy No date: RA (rheumatoid arthritis) (HCC)  Past Surgical History: No date: BREAST SURGERY 06/21/2005: CARDIAC CATHETERIZATION; Left     Comment:  Moderate ostial LCx and LAD disease; LVEF 45%; Location:              [facility]; Surgeon: Adrian Blackwater, MD 05/20/2008: CARDIAC CATHETERIZATION; Left     Comment:  No occlusive CAD; LVEF 65%; Location: [facility]; Surgeon:               Rudean Hitt, MD No  date: COLONOSCOPY No date: ESOPHAGOGASTRODUODENOSCOPY No date: TUBAL LIGATION  BMI    Body Mass Index: 26.31 kg/m      Reproductive/Obstetrics negative OB ROS                             Anesthesia Physical Anesthesia Plan  ASA: 3  Anesthesia Plan: General ETT   Post-op Pain Management:    Induction: Intravenous  PONV Risk Score and Plan: Ondansetron, Dexamethasone, Midazolam and Treatment may vary due to age or medical condition  Airway Management Planned: Oral ETT  Additional Equipment:   Intra-op Plan:   Post-operative Plan: Extubation in OR  Informed Consent: I have reviewed the patients History and Physical, chart, labs and discussed the procedure including the risks, benefits and alternatives for the proposed anesthesia with the patient or authorized representative who has indicated his/her understanding and acceptance.     Dental Advisory Given  Plan Discussed with: Anesthesiologist, CRNA and Surgeon  Anesthesia Plan Comments: (Patient consented for risks of anesthesia including but not limited to:  - adverse reactions to medications - damage to eyes, teeth, lips or other oral mucosa - nerve damage due to positioning  - sore throat or hoarseness - Damage to heart, brain, nerves, lungs, other parts of body or loss of life  Patient voiced understanding.)        Anesthesia Quick Evaluation

## 2021-05-17 NOTE — Progress Notes (Signed)
X4  port sites c/d/I with dermabond

## 2021-05-17 NOTE — Discharge Instructions (Signed)

## 2021-05-17 NOTE — Progress Notes (Signed)
RN reviewed information on chart with patient.  Patient denies any reason for blood refusal to be flagged in chart.  Patient states, "I've never refused blood for any reason".  This RN removed flag per patient request.

## 2021-05-18 LAB — SURGICAL PATHOLOGY

## 2021-05-18 NOTE — Anesthesia Postprocedure Evaluation (Signed)
Anesthesia Post Note  Patient: Tina Fields  Procedure(s) Performed: XI ROBOTIC ASSISTED LAPAROSCOPIC CHOLECYSTECTOMY  Patient location during evaluation: PACU Anesthesia Type: General Level of consciousness: awake and alert and oriented Pain management: pain level controlled Vital Signs Assessment: post-procedure vital signs reviewed and stable Respiratory status: spontaneous breathing, nonlabored ventilation and respiratory function stable Cardiovascular status: blood pressure returned to baseline and stable Postop Assessment: no signs of nausea or vomiting Anesthetic complications: no   No notable events documented.   Last Vitals:  Vitals:   05/17/21 1145 05/17/21 1217  BP: 132/74 (!) 144/86  Pulse: 74 74  Resp: (!) 22 18  Temp:  36.8 C  SpO2: 96% 95%    Last Pain:  Vitals:   05/17/21 1217  TempSrc: Temporal  PainSc:                  Esma Kilts

## 2021-06-08 ENCOUNTER — Ambulatory Visit (INDEPENDENT_AMBULATORY_CARE_PROVIDER_SITE_OTHER): Payer: Medicare HMO | Admitting: Physician Assistant

## 2021-06-08 ENCOUNTER — Other Ambulatory Visit: Payer: Self-pay

## 2021-06-08 ENCOUNTER — Encounter: Payer: Self-pay | Admitting: Physician Assistant

## 2021-06-08 VITALS — BP 111/71 | HR 93 | Ht 66.0 in | Wt 159.0 lb

## 2021-06-08 DIAGNOSIS — Z09 Encounter for follow-up examination after completed treatment for conditions other than malignant neoplasm: Secondary | ICD-10-CM

## 2021-06-08 DIAGNOSIS — K8020 Calculus of gallbladder without cholecystitis without obstruction: Secondary | ICD-10-CM

## 2021-06-08 NOTE — Progress Notes (Signed)
[facility] POST-OP OFFICE VISIT  06/08/2021  HPI: Tina Fields is a 75 y.o. female 23 days s/p robotic assisted laparoscopic cholecystectomy for biliary colic with Dr Everlene Farrier  She has done very well at home No issues with pain; only taking tylenol No fever, chills, nausea, emesis She is tolerating PO without issues No diarrhea No issues with her incisions No other complaints   Vital signs: BP 111/71   Pulse 93   Ht 5\' 6"  (1.676 m)   Wt 159 lb (72.1 kg)   SpO2 92%   BMI 25.66 kg/m    Physical Exam: Constitutional: Well appearing female, NAD, ambulating with walker Abdomen: Soft, non-tender, non-distended, no rebound/guarding Skin: Laparoscopic incisions are all well healed, no erythema or drainage   Assessment/Plan: This is a 75 y.o. female 23 days s/p robotic assisted laparoscopic cholecystectomy for biliary colic    - Pain control prn  - Reviewed lifting restrictions; 1 more week  - Reviewed wound care  - Reviewed surgical pathology: CCC  - She can follow up on as needed basis   -- 60, PA-C [facility] 06/08/2021, 3:13 PM [phone] M-F: 7am - 4pm

## 2021-06-08 NOTE — Patient Instructions (Addendum)
If you have any concerns or questions, please feel free to call our office.   GENERAL POST-OPERATIVE PATIENT INSTRUCTIONS   WOUND CARE INSTRUCTIONS:  Keep a dry clean dressing on the wound if there is drainage. The initial bandage may be removed after 24 hours.  Once the wound has quit draining you may leave it open to air.  If clothing rubs against the wound or causes irritation and the wound is not draining you may cover it with a dry dressing during the daytime.  Try to keep the wound dry and avoid ointments on the wound unless directed to do so.  If the wound becomes bright red and painful or starts to drain infected material that is not clear, please contact your physician immediately.  If the wound is mildly pink and has a thick firm ridge underneath it, this is normal, and is referred to as a healing ridge.  This will resolve over the next 4-6 weeks.  BATHING: You may shower if you have been informed of this by your surgeon. However, Please do not submerge in a tub, hot tub, or pool until incisions are completely sealed or have been told by your surgeon that you may do so.  DIET:  You may eat any foods that you can tolerate.  It is a good idea to eat a high fiber diet and take in plenty of fluids to prevent constipation.  If you do become constipated you may want to take a mild laxative or take ducolax tablets on a daily basis until your bowel habits are regular.  Constipation can be very uncomfortable, along with straining, after recent surgery.  ACTIVITY:  You are encouraged to cough and deep breath or use your incentive spirometer if you were given one, every 15-30 minutes when awake.  This will help prevent respiratory complications and low grade fevers post-operatively if you had a general anesthetic.  You may want to hug a pillow when coughing and sneezing to add additional support to the surgical area, if you had abdominal or chest surgery, which will decrease pain during these times.  You  are encouraged to walk and engage in light activity for the next two weeks.  You should not lift more than 10-15 pounds, until 06/14/2021 as it could put you at increased risk for complications.  Twenty pounds is roughly equivalent to a plastic bag of groceries. At that time- Listen to your body when lifting, if you have pain when lifting, stop and then try again in a few days. Soreness after doing exercises or activities of daily living is normal as you get back in to your normal routine.  MEDICATIONS:  Try to take narcotic medications and anti-inflammatory medications, such as tylenol, ibuprofen, naprosyn, etc., with food.  This will minimize stomach upset from the medication.  Should you develop nausea and vomiting from the pain medication, or develop a rash, please discontinue the medication and contact your physician.  You should not drive, make important decisions, or operate machinery when taking narcotic pain medication.  SUNBLOCK Use sun block to incision area over the next year if this area will be exposed to sun. This helps decrease scarring and will allow you avoid a permanent darkened area over your incision.06/14/2021   Gallbladder Eating Plan  If you have a gallbladder condition, you may have trouble digesting fats. Eating a low-fat diet can help reduce your symptoms, and may be helpful before and after having surgery to remove your gallbladder (cholecystectomy). Your health  care provider may recommend that you work with a diet and nutrition specialist (dietitian) to help you reduce the amount of fat in your diet. What are tips for following this plan? General guidelines Limit your fat intake to less than 30% of your total daily calories. If you eat around 1,800 calories each day, this is less than 60 grams (g) of fat per day. Fat is an important part of a healthy diet. Eating a low-fat diet can make it hard to maintain a healthy body weight. Ask your dietitian how much fat, calories,  and other nutrients you need each day. Eat small, frequent meals throughout the day instead of three large meals. Drink at least 8-10 cups of fluid a day. Drink enough fluid to keep your urine clear or pale yellow. Limit alcohol intake to no more than 1 drink a day for nonpregnant women and 2 drinks a day for men. One drink equals 12 oz of beer, 5 oz of wine, or 1 oz of hard liquor. Reading food labels  Check Nutrition Facts on food labels for the amount of fat per serving. Choose foods with less than 3 grams of fat per serving. Shopping Choose nonfat and low-fat healthy foods. Look for the words "nonfat," "low fat," or "fat free." Avoid buying processed or prepackaged foods. Cooking Cook using low-fat methods, such as baking, broiling, grilling, or boiling. Cook with small amounts of healthy fats, such as olive oil, grapeseed oil, canola oil, or sunflower oil. What foods are recommended? All fresh, frozen, or canned fruits and vegetables. Whole grains. Low-fat or non-fat (skim) milk and yogurt. Lean meat, skinless poultry, fish, eggs, and beans. Low-fat protein supplement powders or drinks. Spices and herbs. What foods are not recommended? High-fat foods. These include baked goods, fast food, fatty cuts of meat, ice cream, french toast, sweet rolls, pizza, cheese bread, foods covered with butter, creamy sauces, or cheese. Fried foods. These include french fries, tempura, battered fish, breaded chicken, fried breads, and sweets. Foods with strong odors. Foods that cause bloating and gas. Summary A low-fat diet can be helpful if you have a gallbladder condition, or before and after gallbladder surgery. Limit your fat intake to less than 30% of your total daily calories. This is about 60 g of fat if you eat 1,800 calories each day. Eat small, frequent meals throughout the day instead of three large meals. This information is not intended to replace advice given to you by your health care  provider. Make sure you discuss any questions you have with your health care provider. Document Revised: 04/08/2020 Document Reviewed: 04/08/2020 Elsevier Patient Education  2022 [employer].

## 2021-09-27 ENCOUNTER — Emergency Department
Admission: EM | Admit: 2021-09-27 | Discharge: 2021-09-27 | Disposition: A | Discharge status: Home / Self Care | Payer: Medicare HMO | Attending: Emergency Medicine | Admitting: Emergency Medicine

## 2021-09-27 ENCOUNTER — Emergency Department: Payer: Medicare HMO

## 2021-09-27 ENCOUNTER — Encounter: Payer: Self-pay | Admitting: Emergency Medicine

## 2021-09-27 ENCOUNTER — Other Ambulatory Visit: Payer: Self-pay

## 2021-09-27 DIAGNOSIS — I110 Hypertensive heart disease with heart failure: Secondary | ICD-10-CM | POA: Insufficient documentation

## 2021-09-27 DIAGNOSIS — R0602 Shortness of breath: Secondary | ICD-10-CM | POA: Diagnosis present

## 2021-09-27 DIAGNOSIS — Z20822 Contact with and (suspected) exposure to covid-19: Secondary | ICD-10-CM | POA: Insufficient documentation

## 2021-09-27 DIAGNOSIS — J45909 Unspecified asthma, uncomplicated: Secondary | ICD-10-CM | POA: Insufficient documentation

## 2021-09-27 DIAGNOSIS — I509 Heart failure, unspecified: Secondary | ICD-10-CM | POA: Diagnosis not present

## 2021-09-27 LAB — CBC
HCT: 39.7 % (ref 36.0–46.0)
Hemoglobin: 12.5 g/dL (ref 12.0–15.0)
MCH: 30.9 pg (ref 26.0–34.0)
MCHC: 31.5 g/dL (ref 30.0–36.0)
MCV: 98.3 fL (ref 80.0–100.0)
Platelets: 268 10*3/uL (ref 150–400)
RBC: 4.04 MIL/uL (ref 3.87–5.11)
RDW: 14.6 % (ref 11.5–15.5)
WBC: 7.2 10*3/uL (ref 4.0–10.5)
nRBC: 0 % (ref 0.0–0.2)

## 2021-09-27 LAB — RESP PANEL BY RT-PCR (FLU A&B, COVID) ARPGX2
Influenza A by PCR: NEGATIVE
Influenza B by PCR: NEGATIVE
SARS Coronavirus 2 by RT PCR: NEGATIVE

## 2021-09-27 LAB — BASIC METABOLIC PANEL
Anion gap: 7 (ref 5–15)
BUN: 21 mg/dL (ref 8–23)
CO2: 25 mmol/L (ref 22–32)
Calcium: 8.2 mg/dL — ABNORMAL LOW (ref 8.9–10.3)
Chloride: 108 mmol/L (ref 98–111)
Creatinine, Ser: 0.7 mg/dL (ref 0.44–1.00)
GFR, Estimated: >60 mL/min (ref >60)
Glucose, Bld: 123 mg/dL — ABNORMAL HIGH (ref 70–99)
Potassium: 2.9 mmol/L — ABNORMAL LOW (ref 3.5–5.1)
Sodium: 140 mmol/L (ref 135–145)

## 2021-09-27 LAB — TROPONIN I (HIGH SENSITIVITY): Troponin I (High Sensitivity): 15 ng/L (ref <18)

## 2021-09-27 LAB — BRAIN NATRIURETIC PEPTIDE: B Natriuretic Peptide: 843 pg/mL — ABNORMAL HIGH (ref 0.0–100.0)

## 2021-09-27 MED ORDER — POTASSIUM CHLORIDE ER 20 MEQ PO TBCR: 10.0000 meq | EXTENDED_RELEASE_TABLET | Freq: Two times a day (BID) | ORAL | 0 refills | Status: DC

## 2021-09-27 MED ORDER — FUROSEMIDE 10 MG/ML IJ SOLN
40.0000 mg | Freq: Once | INTRAMUSCULAR | Status: AC
  Administered 2021-09-27: 18:00:00 40 mg via INTRAVENOUS
  Filled 2021-09-27: qty 4

## 2021-09-27 MED ORDER — POTASSIUM CHLORIDE CRYS ER 20 MEQ PO TBCR
20.0000 meq | EXTENDED_RELEASE_TABLET | Freq: Once | ORAL | Status: AC
  Administered 2021-09-27: 20:00:00 20 meq via ORAL
  Filled 2021-09-27: qty 1

## 2021-09-27 NOTE — Discharge Instructions (Signed)
Continue take your Lasix as prescribed.  Your potassium level is slightly low.  Take the potassium prescribed today for the next 5 days.  Follow-up with your regular doctor within the next week.  Return to the ER immediately for new, worsening, or persistent shortness of breath, chest pain, weakness or lightheadedness, increased swelling or significant weight gain, or any other new or worsening symptoms that concern you.

## 2021-09-27 NOTE — ED Triage Notes (Signed)
Pt to ED via [facility] with c/o [facility] that began today while she was lying in bed, she sat up in the bed it did get better. She has HX of CHF and HTN, she is currently on Prednisone for her RA. Pt is able to speak in complete clear sentences. Bilat upper lung sounds Exp Wheezing. Bilat lower lung sounds clear.

## 2021-09-27 NOTE — ED Provider Notes (Signed)
[facility] Provider Note    Event Date/Time   First MD Initiated Contact with Patient 09/27/21 1729     (approximate)   History   Shortness of Breath   HPI  Tina Fields is a 76 y.o. female with history of asthma, hypertension, RA, and GERD who presents with shortness of breath, acute onset this morning when she was eating breakfast, and now resolved.  The patient states that she felt like her lungs were crackling.  She used her inhaler and it did not get better immediately, but has since resolved.  She denies orthopnea or nocturnal dyspnea.  She denies any fever or chills.  She did have some cough earlier that has resolved.  She has no chest pain.  She denies any acute leg swelling.      Physical Exam   Triage Vital Signs: ED Triage Vitals  Enc Vitals Group     BP 09/27/21 1434 (!) 158/102     Pulse Rate 09/27/21 1434 82     Resp 09/27/21 1434 18     Temp 09/27/21 1434 (!) 97 F (36.1 C)     Temp Source 09/27/21 1434 Oral     SpO2 09/27/21 1434 94 %     Weight 09/27/21 1652 158 lb 15.2 oz (72.1 kg)     Height 09/27/21 1407 5\' 6"  (1.676 m)     Head Circumference --      Peak Flow --      Pain Score --      Pain Loc --      Pain Edu? --      Excl. in GC? --     Most recent vital signs: Vitals:   09/27/21 1434 09/27/21 1813  BP: (!) 158/102 (!) 162/85  Pulse: 82 84  Resp: 18 17  Temp: (!) 97 F (36.1 C) (!) 97.4 F (36.3 C)  SpO2: 94% 96%     General: Awake, no distress.  CV:  Good peripheral perfusion.  Resp:  Normal effort.  Lungs CTAB, slightly diminished in bases. Abd:  No distention.  Other:  1+ bilateral lower extremity edema.   ED Results / Procedures / Treatments   Labs (all labs ordered are listed, but only abnormal results are displayed) Labs Reviewed  BASIC METABOLIC PANEL - Abnormal; Notable for the following components:      Result Value   Potassium 2.9 (*)    Glucose, Bld 123 (*)    Calcium 8.2 (*)     All other components within normal limits  BRAIN NATRIURETIC PEPTIDE - Abnormal; Notable for the following components:   B Natriuretic Peptide 843.0 (*)    All other components within normal limits  RESP PANEL BY RT-PCR (FLU A&B, COVID) ARPGX2  CBC  TROPONIN I (HIGH SENSITIVITY)  TROPONIN I (HIGH SENSITIVITY)     EKG  ED ECG REPORT I, Dionne Bucy, the attending physician, personally viewed and interpreted this ECG.  Date: 09/27/2021 EKG Time: 1439 Rate: 85 Rhythm: normal sinus rhythm QRS Axis: normal Intervals: normal ST/T Wave abnormalities: Nonspecific T wave abnormality Narrative Interpretation: no evidence of acute ischemia; no significant change when compared to EKG of 03/04/2021    RADIOLOGY  Chest x-ray interpreted by me shows bilateral interstitial infiltrates  PROCEDURES:  Critical Care performed: No  Procedures   MEDICATIONS ORDERED IN ED: Medications  potassium chloride SA (KLOR-CON M) CR tablet 20 mEq (has no administration in time range)  furosemide (LASIX) injection 40 mg (40  mg Intravenous Given 09/27/21 1814)     IMPRESSION / MDM / ASSESSMENT AND PLAN / ED COURSE  I reviewed the triage vital signs and the nursing notes.  76 year old female with PMH as noted above presents with an episode of shortness of breath earlier today that has subsequently resolved.  She had some associated cough.  She reports chronic leg swelling and states that she is on Lasix, but this has not worsened recently.  I reviewed the past medical records in epic.  The patient had an echocardiogram showing a relatively normal LVEF on 6/30, but [facility] cardiology note from 03/15/2021 confirms diagnosis of heart failure and CAD.  On exam the patient is well-appearing and her vital signs are normal except for hypertension.  O2 saturation is in the high 90s on room air and the patient does not demonstrate any increased work of breathing or respiratory distress.  On labs today,  troponin is normal, BMP shows borderline hypokalemia, and CBC is normal.  Chest x-ray shows evidence of CHF.  Differential diagnosis includes, but is not limited to, mild CHF exacerbation, reactive airway/bronchospasm, acute bronchitis, pneumonia, COVID-19, influenza, other viral etiology.  There is no clinical evidence of ACS.  The patient has had no chest pain and has no EKG changes.  Based on this and the timing of the patient's symptoms there is no indication for repeat troponin.  We will give a dose of IV Lasix, obtain a BMP and a respiratory panel, and reassess.  The patient is on the cardiac monitor to evaluate for evidence of arrhythmia and/or significant heart rate changes.  ----------------------------------------- 7:50 PM on 09/27/2021 -----------------------------------------  BNP is somewhat elevated.  Respiratory panel is negative.  On reassessment, the patient remains asymptomatic.  Overall presentation is consistent with mild CHF exacerbation.  I did consider whether the patient may require admission, however given her lack of hypoxia or sustained shortness of breath, reassuring lab work-up and overall well appearance on exam, the patient is stable for discharge home.  She does report some leg cramps.  Given the borderline low potassium and the Lasix dose today I will give a 5-day course of potassium repletion.  I counseled the patient on the results of the work-up.  Return precautions given, and she expresses understanding.  She will follow-up with her primary care doctor.    FINAL CLINICAL IMPRESSION(S) / ED DIAGNOSES   Final diagnoses:  Acute on chronic congestive heart failure, unspecified heart failure type (HCC)  Shortness of breath     Rx / DC Orders   ED Discharge Orders          Ordered    potassium chloride 20 MEQ TBCR  2 times daily        09/27/21 1948             Note:  This document was prepared using Dragon voice recognition software and may  include unintentional dictation errors.    Dionne Bucy, MD 09/27/21 [phone]

## 2021-12-02 ENCOUNTER — Other Ambulatory Visit: Payer: Self-pay

## 2021-12-02 ENCOUNTER — Emergency Department: Payer: Medicare HMO

## 2021-12-02 ENCOUNTER — Observation Stay
Admission: EM | Admit: 2021-12-02 | Discharge: 2021-12-05 | Disposition: A | Discharge status: Home Health | Payer: Medicare HMO | Attending: Internal Medicine | Admitting: Internal Medicine

## 2021-12-02 DIAGNOSIS — R7989 Other specified abnormal findings of blood chemistry: Secondary | ICD-10-CM | POA: Insufficient documentation

## 2021-12-02 DIAGNOSIS — K219 Gastro-esophageal reflux disease without esophagitis: Secondary | ICD-10-CM | POA: Diagnosis not present

## 2021-12-02 DIAGNOSIS — M6281 Muscle weakness (generalized): Secondary | ICD-10-CM | POA: Diagnosis not present

## 2021-12-02 DIAGNOSIS — I509 Heart failure, unspecified: Secondary | ICD-10-CM

## 2021-12-02 DIAGNOSIS — I252 Old myocardial infarction: Secondary | ICD-10-CM | POA: Insufficient documentation

## 2021-12-02 DIAGNOSIS — R2689 Other abnormalities of gait and mobility: Secondary | ICD-10-CM | POA: Insufficient documentation

## 2021-12-02 DIAGNOSIS — Z66 Do not resuscitate: Secondary | ICD-10-CM | POA: Diagnosis not present

## 2021-12-02 DIAGNOSIS — I5033 Acute on chronic diastolic (congestive) heart failure: Secondary | ICD-10-CM | POA: Insufficient documentation

## 2021-12-02 DIAGNOSIS — R9431 Abnormal electrocardiogram [ECG] [EKG]: Secondary | ICD-10-CM | POA: Diagnosis not present

## 2021-12-02 DIAGNOSIS — Z7982 Long term (current) use of aspirin: Secondary | ICD-10-CM | POA: Diagnosis not present

## 2021-12-02 DIAGNOSIS — R079 Chest pain, unspecified: Secondary | ICD-10-CM | POA: Insufficient documentation

## 2021-12-02 DIAGNOSIS — E876 Hypokalemia: Secondary | ICD-10-CM | POA: Insufficient documentation

## 2021-12-02 DIAGNOSIS — I493 Ventricular premature depolarization: Secondary | ICD-10-CM | POA: Diagnosis not present

## 2021-12-02 DIAGNOSIS — M069 Rheumatoid arthritis, unspecified: Secondary | ICD-10-CM | POA: Diagnosis present

## 2021-12-02 DIAGNOSIS — R269 Unspecified abnormalities of gait and mobility: Secondary | ICD-10-CM | POA: Insufficient documentation

## 2021-12-02 DIAGNOSIS — J45909 Unspecified asthma, uncomplicated: Secondary | ICD-10-CM | POA: Diagnosis not present

## 2021-12-02 DIAGNOSIS — I10 Essential (primary) hypertension: Secondary | ICD-10-CM | POA: Diagnosis present

## 2021-12-02 DIAGNOSIS — R5381 Other malaise: Secondary | ICD-10-CM | POA: Diagnosis not present

## 2021-12-02 DIAGNOSIS — R0600 Dyspnea, unspecified: Secondary | ICD-10-CM | POA: Insufficient documentation

## 2021-12-02 DIAGNOSIS — I2510 Atherosclerotic heart disease of native coronary artery without angina pectoris: Secondary | ICD-10-CM | POA: Diagnosis present

## 2021-12-02 DIAGNOSIS — Z8249 Family history of ischemic heart disease and other diseases of the circulatory system: Secondary | ICD-10-CM | POA: Diagnosis not present

## 2021-12-02 DIAGNOSIS — I959 Hypotension, unspecified: Secondary | ICD-10-CM | POA: Diagnosis present

## 2021-12-02 DIAGNOSIS — I110 Hypertensive heart disease with heart failure: Secondary | ICD-10-CM | POA: Diagnosis not present

## 2021-12-02 DIAGNOSIS — G4733 Obstructive sleep apnea (adult) (pediatric): Secondary | ICD-10-CM | POA: Diagnosis present

## 2021-12-02 DIAGNOSIS — Z91199 Patient's noncompliance with other medical treatment and regimen due to unspecified reason: Secondary | ICD-10-CM | POA: Insufficient documentation

## 2021-12-02 DIAGNOSIS — Z79899 Other long term (current) drug therapy: Secondary | ICD-10-CM | POA: Insufficient documentation

## 2021-12-02 DIAGNOSIS — R2681 Unsteadiness on feet: Secondary | ICD-10-CM | POA: Insufficient documentation

## 2021-12-02 DIAGNOSIS — I5032 Chronic diastolic (congestive) heart failure: Principal | ICD-10-CM | POA: Insufficient documentation

## 2021-12-02 DIAGNOSIS — J811 Chronic pulmonary edema: Secondary | ICD-10-CM | POA: Insufficient documentation

## 2021-12-02 DIAGNOSIS — I5031 Acute diastolic (congestive) heart failure: Secondary | ICD-10-CM

## 2021-12-02 DIAGNOSIS — R0602 Shortness of breath: Secondary | ICD-10-CM

## 2021-12-02 LAB — CBC WITH DIFFERENTIAL/PLATELET
Abs Immature Granulocytes: 0.01 10*3/uL (ref 0.00–0.07)
Basophils Absolute: 0 10*3/uL (ref 0.0–0.1)
Basophils Relative: 1 %
Eosinophils Absolute: 0 10*3/uL (ref 0.0–0.5)
Eosinophils Relative: 0 %
HCT: 39.3 % (ref 36.0–46.0)
Hemoglobin: 12.7 g/dL (ref 12.0–15.0)
Immature Granulocytes: 0 %
Lymphocytes Relative: 20 %
Lymphs Abs: 0.8 10*3/uL (ref 0.7–4.0)
MCH: 31.4 pg (ref 26.0–34.0)
MCHC: 32.3 g/dL (ref 30.0–36.0)
MCV: 97 fL (ref 80.0–100.0)
Monocytes Absolute: 0.4 10*3/uL (ref 0.1–1.0)
Monocytes Relative: 9 %
Neutro Abs: 2.8 10*3/uL (ref 1.7–7.7)
Neutrophils Relative %: 70 %
Platelets: 237 10*3/uL (ref 150–400)
RBC: 4.05 MIL/uL (ref 3.87–5.11)
RDW: 14.8 % (ref 11.5–15.5)
WBC: 4 10*3/uL (ref 4.0–10.5)
nRBC: 0 % (ref 0.0–0.2)

## 2021-12-02 LAB — BASIC METABOLIC PANEL
Anion gap: 9 (ref 5–15)
BUN: 17 mg/dL (ref 8–23)
CO2: 24 mmol/L (ref 22–32)
Calcium: 8.5 mg/dL — ABNORMAL LOW (ref 8.9–10.3)
Chloride: 108 mmol/L (ref 98–111)
Creatinine, Ser: 0.9 mg/dL (ref 0.44–1.00)
GFR, Estimated: >60 mL/min (ref >60)
Glucose, Bld: 119 mg/dL — ABNORMAL HIGH (ref 70–99)
Potassium: 4.1 mmol/L (ref 3.5–5.1)
Sodium: 141 mmol/L (ref 135–145)

## 2021-12-02 LAB — BRAIN NATRIURETIC PEPTIDE: B Natriuretic Peptide: 826.8 pg/mL — ABNORMAL HIGH (ref 0.0–100.0)

## 2021-12-02 LAB — TROPONIN I (HIGH SENSITIVITY)
Troponin I (High Sensitivity): 10 ng/L (ref <18)
Troponin I (High Sensitivity): 10 ng/L (ref <18)

## 2021-12-02 LAB — MAGNESIUM: Magnesium: 1.7 mg/dL (ref 1.7–2.4)

## 2021-12-02 MED ORDER — POLYETHYLENE GLYCOL 3350 17 G PO PACK
17.0000 g | PACK | Freq: Every day | ORAL | Status: DC | PRN
  Filled 2021-12-02: qty 1

## 2021-12-02 MED ORDER — SODIUM CHLORIDE 0.9% FLUSH
3.0000 mL | Freq: Two times a day (BID) | INTRAVENOUS | Status: DC
  Administered 2021-12-02 – 2021-12-05 (×5): 3 mL via INTRAVENOUS

## 2021-12-02 MED ORDER — MONTELUKAST SODIUM 10 MG PO TABS
10.0000 mg | ORAL_TABLET | Freq: Every day | ORAL | Status: DC
  Administered 2021-12-02 – 2021-12-04 (×3): 10 mg via ORAL
  Filled 2021-12-02 (×3): qty 1

## 2021-12-02 MED ORDER — HYDROCODONE-ACETAMINOPHEN 5-325 MG PO TABS: 1.0000 | ORAL_TABLET | ORAL | Status: DC | PRN

## 2021-12-02 MED ORDER — PANTOPRAZOLE SODIUM 40 MG PO TBEC: 40.0000 mg | DELAYED_RELEASE_TABLET | Freq: Every day | ORAL | Status: DC | PRN

## 2021-12-02 MED ORDER — VITAMIN B-12 1000 MCG PO TABS
1000.0000 ug | ORAL_TABLET | Freq: Every day | ORAL | Status: DC
  Administered 2021-12-02 – 2021-12-05 (×4): 1000 ug via ORAL
  Filled 2021-12-02 (×4): qty 1

## 2021-12-02 MED ORDER — FOLIC ACID 1 MG PO TABS
1.0000 mg | ORAL_TABLET | Freq: Every day | ORAL | Status: DC
  Administered 2021-12-02 – 2021-12-05 (×4): 1 mg via ORAL
  Filled 2021-12-02 (×4): qty 1

## 2021-12-02 MED ORDER — METHOTREXATE 2.5 MG PO TABS: 17.5000 mg | ORAL_TABLET | ORAL | Status: DC

## 2021-12-02 MED ORDER — FLUTICASONE PROPIONATE 50 MCG/ACT NA SUSP: 2.0000 | Freq: Every day | NASAL | Status: DC | PRN

## 2021-12-02 MED ORDER — SPIRONOLACTONE 25 MG PO TABS: 12.5000 mg | ORAL_TABLET | Freq: Every day | ORAL | Status: DC

## 2021-12-02 MED ORDER — MOMETASONE FURO-FORMOTEROL FUM 100-5 MCG/ACT IN AERO
2.0000 | INHALATION_SPRAY | Freq: Two times a day (BID) | RESPIRATORY_TRACT | Status: DC
Start: 2021-12-02 — End: 2021-12-02

## 2021-12-02 MED ORDER — SODIUM CHLORIDE 0.9% FLUSH: 3.0000 mL | INTRAVENOUS | Status: DC | PRN

## 2021-12-02 MED ORDER — ACETAMINOPHEN 325 MG PO TABS
650.0000 mg | ORAL_TABLET | Freq: Four times a day (QID) | ORAL | Status: DC | PRN
  Administered 2021-12-03: 650 mg via ORAL
  Filled 2021-12-02: qty 2

## 2021-12-02 MED ORDER — ISOSORBIDE MONONITRATE ER 30 MG PO TB24
30.0000 mg | ORAL_TABLET | Freq: Every day | ORAL | Status: DC
  Administered 2021-12-02 – 2021-12-05 (×4): 30 mg via ORAL
  Filled 2021-12-02 (×4): qty 1

## 2021-12-02 MED ORDER — FUROSEMIDE 10 MG/ML IJ SOLN
40.0000 mg | Freq: Every day | INTRAMUSCULAR | Status: DC
  Administered 2021-12-03 – 2021-12-05 (×3): 40 mg via INTRAVENOUS
  Filled 2021-12-02 (×3): qty 4

## 2021-12-02 MED ORDER — METOPROLOL SUCCINATE ER 25 MG PO TB24
25.0000 mg | ORAL_TABLET | Freq: Every day | ORAL | Status: DC
  Filled 2021-12-02 (×2): qty 1

## 2021-12-02 MED ORDER — SODIUM CHLORIDE 0.9 % IV SOLN: 250.0000 mL | INTRAVENOUS | Status: DC | PRN

## 2021-12-02 MED ORDER — ATORVASTATIN CALCIUM 20 MG PO TABS
40.0000 mg | ORAL_TABLET | Freq: Every morning | ORAL | Status: DC
  Administered 2021-12-02 – 2021-12-05 (×4): 40 mg via ORAL
  Filled 2021-12-02 (×4): qty 2

## 2021-12-02 MED ORDER — ACETAMINOPHEN 650 MG RE SUPP: 650.0000 mg | Freq: Four times a day (QID) | RECTAL | Status: DC | PRN

## 2021-12-02 MED ORDER — ALBUTEROL SULFATE (2.5 MG/3ML) 0.083% IN NEBU: 2.5000 mg | INHALATION_SOLUTION | Freq: Four times a day (QID) | RESPIRATORY_TRACT | Status: DC | PRN

## 2021-12-02 MED ORDER — ENOXAPARIN SODIUM 40 MG/0.4ML IJ SOSY
40.0000 mg | PREFILLED_SYRINGE | INTRAMUSCULAR | Status: DC
  Administered 2021-12-02 – 2021-12-04 (×3): 40 mg via SUBCUTANEOUS
  Filled 2021-12-02 (×3): qty 0.4

## 2021-12-02 MED ORDER — ASPIRIN EC 81 MG PO TBEC
81.0000 mg | DELAYED_RELEASE_TABLET | Freq: Every day | ORAL | Status: DC
  Administered 2021-12-02 – 2021-12-05 (×4): 81 mg via ORAL
  Filled 2021-12-02 (×4): qty 1

## 2021-12-02 MED ORDER — FUROSEMIDE 10 MG/ML IJ SOLN
40.0000 mg | Freq: Once | INTRAMUSCULAR | Status: AC
  Administered 2021-12-02: 40 mg via INTRAVENOUS
  Filled 2021-12-02: qty 4

## 2021-12-02 MED ORDER — CYCLOBENZAPRINE HCL 10 MG PO TABS: 5.0000 mg | ORAL_TABLET | Freq: Every evening | ORAL | Status: DC | PRN

## 2021-12-02 MED ORDER — SACUBITRIL-VALSARTAN 24-26 MG PO TABS
1.0000 | ORAL_TABLET | Freq: Every day | ORAL | Status: DC
  Administered 2021-12-02 – 2021-12-05 (×4): 1 via ORAL
  Filled 2021-12-02 (×4): qty 1

## 2021-12-02 MED ORDER — POTASSIUM CHLORIDE ER 20 MEQ PO TBCR: 10.0000 meq | EXTENDED_RELEASE_TABLET | Freq: Two times a day (BID) | ORAL | Status: DC

## 2021-12-02 NOTE — ED Notes (Signed)
Contacted pharmacy to verify medications. Pending med rec to be completed. ?

## 2021-12-02 NOTE — Assessment & Plan Note (Signed)
Blood pressure is well controlled ?Continue beta-blockers and nitrates ?

## 2021-12-02 NOTE — ED Triage Notes (Signed)
Was awakened with chest pain and shortness of breath that resolved before EMS arrival. ?

## 2021-12-02 NOTE — Progress Notes (Signed)
Admission profile updated. ?

## 2021-12-02 NOTE — Assessment & Plan Note (Signed)
Stable

## 2021-12-02 NOTE — Assessment & Plan Note (Signed)
Stable.  Continue PPI. 

## 2021-12-02 NOTE — H&P (Signed)
?History and Physical  ? ? ?Patient: Tina Fields PYK:[id number] DOB: Jan 03, 1946 ?DOA: 12/02/2021 ?DOS: the patient was seen and examined on 12/02/2021 ?PCP: Oswaldo Conroy, MD  ?Patient coming from: Home ? ?Chief Complaint:  ?Chief Complaint  ?Patient presents with  ? Chest Pain  ? ?HPI: Tina Fields is a 76 y.o. female with medical history significant for rheumatoid arthritis, GERD, hypertension, CHF, coronary artery disease, obstructive sleep apnea who presents to the ER for evaluation of a 1 week history of progressively worsening weakness, fatigue and shortness of breath. ?Patient states that her symptoms started about a week ago and have progressively worsened prompting her visit to her primary care provider who asked her to make an appointment to see her cardiologist. ?She has not been able to see her cardiologist and has an appointment for December 07, 2021. ?She presented to the ER because her shortness of breath has continued to worsen and is present both at rest and with exertion.  She also has lower extremity swelling, a cough productive of clear phlegm and sore throat. ?She denies having any fever, no chills, no chest pain, no dizziness, no lightheadedness, no abdominal pain, no urinary symptoms, no blurred vision no focal deficit. ? ?Review of Systems: As mentioned in the history of present illness. All other systems reviewed and are negative. ?Past Medical History:  ?Diagnosis Date  ? Aortic atherosclerosis (HCC)   ? Asthma   ? Coronary artery disease   ? Dyspnea   ? Edema of both legs   ? GERD (gastroesophageal reflux disease)   ? HFrEF (heart failure with reduced ejection fraction) (HCC)   ? History of 2019 novel coronavirus disease (COVID-19) 10/28/2020  ? Hypertension   ? Myocardial infarction [facility])   ? OSA (obstructive sleep apnea)   ? noncompliant with nocturnal PAP therapy  ? RA (rheumatoid arthritis) (HCC)   ? ?Past Surgical History:  ?Procedure Laterality Date  ? BREAST  SURGERY    ? CARDIAC CATHETERIZATION Left 06/21/2005  ? Moderate ostial LCx and LAD disease; LVEF 45%; Location: [facility]; Surgeon: Adrian Blackwater, MD  ? CARDIAC CATHETERIZATION Left 05/20/2008  ? No occlusive CAD; LVEF 65%; Location: [facility]; Surgeon: Rudean Hitt, MD  ? COLONOSCOPY    ? ESOPHAGOGASTRODUODENOSCOPY    ? TUBAL LIGATION    ? ?Social History:  reports that she quit smoking about 26 years ago. Her smoking use included cigarettes. She has never used smokeless tobacco. She reports that she does not drink alcohol and does not use drugs. ? ?Allergies  ?Allergen Reactions  ? Ace Inhibitors Cough  ?  Other reaction(s): Cough ?  ? Hydrochlorothiazide Other (See Comments)  ?  Cramps  ? Latex Hives  ? Azithromycin Rash  ? ? ?Family History  ?Problem Relation Age of Onset  ? CAD Mother   ? Diabetes Mother   ? Hypertension Mother   ? CAD Father   ? Diabetes Father   ? Hypertension Father   ? Stroke Father   ? ? ?Prior to Admission medications   ?Medication Sig Start Date End Date Taking? Authorizing Provider  ?albuterol (PROVENTIL) (2.5 MG/3ML) 0.083% nebulizer solution Take 2.5 mg by nebulization every 6 (six) hours as needed for wheezing or shortness of breath.    [provider]  ?albuterol (VENTOLIN HFA) 108 (90 Base) MCG/ACT inhaler Inhale 1-2 puffs into the lungs every 6 (six) hours as needed for wheezing or shortness of breath. 11/06/20   [provider]  ?aspirin EC 81  MG EC tablet Take 1 tablet (81 mg total) by mouth daily. Swallow whole. 01/06/21   Rai, Delene Ruffini, MD  ?atorvastatin (LIPITOR) 40 MG tablet Take 40 mg by mouth every morning. 09/13/21   [provider]  ?atorvastatin (LIPITOR) 80 MG tablet Take 80 mg by mouth daily. 03/15/21   [provider]  ?budesonide-formoterol (SYMBICORT) 80-4.5 MCG/ACT inhaler Inhale 2 puffs into the lungs 2 (two) times daily as needed (asthma). 12/03/18   [provider]  ?[employer] VITAMIN B12 1000 MCG tablet Take 1,000 mcg by mouth daily.  08/04/20   [provider]  ?cyclobenzaprine (FLEXERIL) 5 MG tablet Take 5 mg by mouth See admin instructions. Take 5 mg by mouth once daily at bedtime as needed for muscle pain    [provider]  ?ENTRESTO 24-26 MG Take 1 tablet by mouth daily. 11/02/20   [provider]  ?fluticasone (FLONASE) 50 MCG/ACT nasal spray Place 2 sprays into the nose daily as needed for allergies. 12/03/18   [provider]  ?folic acid (FOLVITE) 1 MG tablet Take 1 mg by mouth daily. 09/19/21   [provider]  ?furosemide (LASIX) 40 MG tablet Take 40 mg by mouth daily.    [provider]  ?HUMIRA PEN 40 MG/0.4ML PNKT Inject 40 mg into the skin every 14 (fourteen) days. 11/26/21   [provider]  ?HYDROcodone-acetaminophen (NORCO/VICODIN) 5-325 MG tablet Take 1 tablet by mouth every 4 (four) hours as needed for moderate pain. 05/17/21   Pabon, Diego F, MD  ?isosorbide mononitrate (IMDUR) 30 MG 24 hr tablet Take 30 mg by mouth daily.    [provider]  ?methotrexate (RHEUMATREX) 2.5 MG tablet Take 7 tablets by mouth once a week. 10/15/21   [provider]  ?metoprolol succinate (TOPROL-XL) 25 MG 24 hr tablet Take 25 mg by mouth daily.    [provider]  ?MIRALAX 17 GM/SCOOP powder Take 17 g by mouth daily as needed for moderate constipation. Dissolve 17 gm in 8 ounces of water and drink 01/11/21   [provider]  ?montelukast (SINGULAIR) 10 MG tablet Take 10 mg by mouth at bedtime.    [provider]  ?omeprazole (PRILOSEC) 20 MG capsule Take 20 mg by mouth 2 (two) times daily before a meal.    [provider]  ?potassium chloride 20 MEQ TBCR Take 10 mEq by mouth 2 (two) times daily for 5 days. 09/27/21 10/02/21  Dionne Bucy, MD  ?spironolactone (ALDACTONE) 25 MG tablet Take 12.5 mg by mouth daily.    [provider]  ?Vitamin D, Ergocalciferol, (DRISDOL) 1.25 MG (50000 UNIT) CAPS capsule Take 50,000 Units by  mouth every Sunday. 09/11/20   [provider]  ? ? ?Physical Exam: ?Vitals:  ? 12/02/21 0500 12/02/21 0630 12/02/21 0741 12/02/21 0745  ?BP: (!) 153/89 (!) 152/87    ?Pulse: 75 71  88  ?Resp: 18 (!) 27  18  ?Temp:      ?TempSrc:      ?SpO2: 95% 97%  98%  ?Weight:   77.1 kg   ?Height:    (1.676 m)   ? ?Physical Exam ?Vitals and nursing note reviewed.  ?Constitutional:   ?   Appearance: She is well-developed.  ?HENT:  ?   Head: Normocephalic and atraumatic.  ?Cardiovascular:  ?   Rate and Rhythm: Normal rate and regular rhythm.  ?   Heart sounds: Normal heart sounds.  ?Pulmonary:  ?   Effort: Pulmonary effort  is normal.  ?   Breath sounds: Examination of the right-lower field reveals rales. Examination of the left-lower field reveals rales. Rales present.  ?Abdominal:  ?   General: Bowel sounds are normal.  ?   Palpations: Abdomen is soft.  ?Musculoskeletal:  ?   Cervical back: Normal range of motion and neck supple.  ?   Right lower leg: Edema present.  ?   Left lower leg: Edema present.  ?Skin: ?   General: Skin is warm and dry.  ?Neurological:  ?   General: No focal deficit present.  ?   Mental Status: She is alert and oriented to person, place, and time.  ?Psychiatric:     ?   Mood and Affect: Mood normal.     ?   Behavior: Behavior normal.  ? ? ?Data Reviewed: ?Relevant notes from primary care and specialist visits, past discharge summaries as available in EHR, including Care Everywhere. ?Prior diagnostic testing as pertinent to current admission diagnoses ?Updated medications and problem lists for reconciliation ?ED course, including vitals, labs, imaging, treatment and response to treatment ?Triage notes, nursing and pharmacy notes and ED provider's notes ?Notable results as noted in HPI ?Labs reviewed by me essentially negative except for elevated BNP at 826.8 ?Chest x-ray reviewed by me shows Diffusely increased pulmonary interstitial markings  ?Twelve-lead EKG reviewed by me shows sinus rhythm,  PVCs and prolonged QT interval. ?There are no new results to review at this time. ? ?Assessment and Plan: ?* Acute diastolic CHF (congestive heart failure) (HCC) ?Patient presents for evaluation of fatigue a

## 2021-12-02 NOTE — Assessment & Plan Note (Signed)
Patient presents for evaluation of fatigue and worsening shortness of breath and imaging shows findings suggestive of pulmonary edema. ?BNP is also elevated ?We will place patient on Lasix 40 mg IV daily ?Continue Entresto, spironolactone and metoprolol ?

## 2021-12-02 NOTE — ED Notes (Signed)
Pt sitting up eating lunch at this time. Denies any needs. Call bell in reach. Bed in lowest locked position.  ?

## 2021-12-02 NOTE — Assessment & Plan Note (Signed)
Stable ?Continue methotrexate ?

## 2021-12-02 NOTE — Assessment & Plan Note (Signed)
Stable ?Continue Toprol, Imdur, aspirin and statins ?

## 2021-12-02 NOTE — ED Notes (Signed)
Pt unhooked from monitoring equipment to use the [facility]. Pt ambulated independently and with a steady gait  ?

## 2021-12-02 NOTE — ED Provider Notes (Signed)
? ?[facility] ?Provider Note ? ? ? Event Date/Time  ? First MD Initiated Contact with Patient 12/02/21 [phone]   ?  (approximate) ? ? ?History  ? ?Chest Pain ? ? ?HPI ? ?Tina Fields is a 76 y.o. female with history of CAD, CHF, hypertension, rheumatoid arthritis, asthma who presents to the emergency department EMS for concerns for chest discomfort and shortness of breath.  States that she was sleeping and was having a dream about having chest tightness which she states woke her from sleep but when she woke up she was not having any chest pain.  She states she has been  more short of breath than normal over the past week.  No lower extremity swelling or pain.  No fever.  Has had a nonproductive cough.  No nausea, vomiting, dizziness or diaphoresis.  Hemodynamically stable in route with EMS.  No ST changes noted on their twelve-lead.  EMS reports he did have occasional PVCs at 1 point was in bigeminy. ? ? ?History provided by patient and EMS. ? ? ? ?Past Medical History:  ?Diagnosis Date  ? Aortic atherosclerosis ([place])   ? Asthma   ? Coronary artery disease   ? Dyspnea   ? Edema of both legs   ? GERD (gastroesophageal reflux disease)   ? HFrEF (heart failure with reduced ejection fraction) ([place])   ? History of 2019 novel coronavirus disease (COVID-19) 10/28/2020  ? Hypertension   ? Myocardial infarction [facility])   ? OSA (obstructive sleep apnea)   ? noncompliant with nocturnal PAP therapy  ? RA (rheumatoid arthritis) ([place])   ? ? ?Past Surgical History:  ?Procedure Laterality Date  ? BREAST SURGERY    ? CARDIAC CATHETERIZATION Left 06/21/2005  ? Moderate ostial LCx and LAD disease; LVEF 45%; Location: [place]; Surgeon: Neoma Laming, MD  ? CARDIAC CATHETERIZATION Left 05/20/2008  ? No occlusive CAD; LVEF 65%; Location: [place]; Surgeon: Katrine Coho, MD  ? COLONOSCOPY    ? ESOPHAGOGASTRODUODENOSCOPY    ? TUBAL LIGATION    ? ? ?MEDICATIONS:  ?Prior to Admission medications   ?Medication Sig Start  Date End Date Taking? Authorizing Provider  ?albuterol (PROVENTIL) (2.5 MG/3ML) 0.083% nebulizer solution Take 2.5 mg by nebulization every 6 (six) hours as needed for wheezing or shortness of breath.    [provider]  ?albuterol (VENTOLIN HFA) 108 (90 Base) MCG/ACT inhaler Inhale 1-2 puffs into the lungs every 6 (six) hours as needed for wheezing or shortness of breath. 11/06/20   [provider]  ?aspirin EC 81 MG EC tablet Take 1 tablet (81 mg total) by mouth daily. Swallow whole. 01/06/21   Rai, Vernelle Emerald, MD  ?atorvastatin (LIPITOR) 80 MG tablet Take 80 mg by mouth daily. 03/15/21   [provider]  ?budesonide-formoterol (SYMBICORT) 80-4.5 MCG/ACT inhaler Inhale 2 puffs into the lungs 2 (two) times daily as needed (asthma). 12/03/18   [provider]  ?[employer] VITAMIN B12 1000 MCG tablet Take 1,000 mcg by mouth daily. 08/04/20   [provider]  ?cyclobenzaprine (FLEXERIL) 5 MG tablet Take 5 mg by mouth See admin instructions. Take 5 mg by mouth once daily at bedtime as needed for muscle pain    [provider]  ?ENTRESTO 24-26 MG Take 1 tablet by mouth daily. 11/02/20   [provider]  ?fluticasone (FLONASE) 50 MCG/ACT nasal spray Place 2 sprays into the nose daily as needed for allergies. 12/03/18   [provider]  ?furosemide (LASIX) 40  MG tablet Take 40 mg by mouth daily.    [provider]  ?HYDROcodone-acetaminophen (NORCO/VICODIN) 5-325 MG tablet Take 1 tablet by mouth every 4 (four) hours as needed for moderate pain. 05/17/21   Pabon, Diego F, MD  ?isosorbide mononitrate (IMDUR) 30 MG 24 hr tablet Take 30 mg by mouth daily.    [provider]  ?metoprolol succinate (TOPROL-XL) 25 MG 24 hr tablet Take 25 mg by mouth daily.    [provider]  ?MIRALAX 17 GM/SCOOP powder Take 17 g by mouth daily as needed for moderate constipation. Dissolve 17 gm in 8 ounces of water and drink 01/11/21   [provider]   ?montelukast (SINGULAIR) 10 MG tablet Take 10 mg by mouth at bedtime.    [provider]  ?omeprazole (PRILOSEC) 20 MG capsule Take 20 mg by mouth 2 (two) times daily before a meal.    [provider]  ?potassium chloride 20 MEQ TBCR Take 10 mEq by mouth 2 (two) times daily for 5 days. 09/27/21 10/02/21  Arta Silence, MD  ?spironolactone (ALDACTONE) 25 MG tablet Take 12.5 mg by mouth daily.    [provider]  ?Vitamin D, Ergocalciferol, (DRISDOL) 1.25 MG (50000 UNIT) CAPS capsule Take 50,000 Units by mouth every Sunday. 09/11/20   [provider]  ? ? ?Physical Exam  ? ?Triage Vital Signs: ?ED Triage Vitals [12/02/21 0424]  ?Enc Vitals Group  ?   BP   ?   Pulse   ?   Resp   ?   Temp   ?   Temp src   ?   SpO2 98 %  ?   Weight   ?   Height   ?   Head Circumference   ?   Peak Flow   ?   Pain Score   ?   Pain Loc   ?   Pain Edu?   ?   Excl. in [place]?   ? ? ?Most recent vital signs: ?Vitals:  ? 12/02/21 0428 12/02/21 0500  ?BP: (!) 156/97 (!) 153/89  ?Pulse: 88 75  ?Resp: 16 18  ?Temp: (!) 97.5 ?F (36.4 ?C)   ?SpO2: 95% 95%  ? ? ?CONSTITUTIONAL: Alert and oriented and responds appropriately to questions. Well-appearing; well-nourished, elderly ?HEAD: Normocephalic, atraumatic ?EYES: Conjunctivae clear, pupils appear equal, sclera nonicteric ?ENT: normal nose; moist mucous membranes ?NECK: Supple, normal ROM ?CARD: RRR; S1 and S2 appreciated; no murmurs, no clicks, no rubs, no gallops ?RESP: Normal chest excursion without splinting or tachypnea; breath sounds clear and equal bilaterally; no wheezes, no rhonchi, no rales, no hypoxia or respiratory distress, speaking full sentences ?ABD/GI: Normal bowel sounds; non-distended; soft, non-tender, no rebound, no guarding, no peritoneal signs ?BACK: The back appears normal ?EXT: Normal ROM in all joints; no deformity noted, no edema; no cyanosis, changes in the joints of her hands consistent with rheumatoid arthritis, no calf tenderness or  calf swelling ?SKIN: Normal color for age and race; warm; no rash on exposed skin ?NEURO: Moves all extremities equally, normal speech ?PSYCH: The patient's mood and manner are appropriate. ? ? ?ED Results / Procedures / Treatments  ? ?LABS: ?(all labs ordered are listed, but only abnormal results are displayed) ?Labs Reviewed  ?BASIC METABOLIC PANEL - Abnormal; Notable for the following components:  ?    Result Value  ? Glucose, Bld 119 (*)   ? Calcium 8.5 (*)   ? All other components within normal limits  ?BRAIN NATRIURETIC PEPTIDE -  Abnormal; Notable for the following components:  ? B Natriuretic Peptide 826.8 (*)   ? All other components within normal limits  ?MAGNESIUM  ?CBC WITH DIFFERENTIAL/PLATELET  ?CBC WITH DIFFERENTIAL/PLATELET  ?TROPONIN I (HIGH SENSITIVITY)  ?TROPONIN I (HIGH SENSITIVITY)  ? ? ? ?EKG: ? EKG Interpretation ? ?Date/Time:  Friday December 02 2021 04:38:18 EDT ?Ventricular Rate:  83 ?PR Interval:  58 ?QRS Duration: 90 ?QT Interval:  472 ?QTC Calculation: 535 ?R Axis:   8 ?Text Interpretation: Sinus rhythm Multiple premature complexes, vent & supraven Short PR interval Left atrial enlargement Nonspecific T abnormalities, lateral leads Prolonged QT interval Baseline wander in lead(s) V6 Confirmed by Pryor Curia [phone]) on 12/02/2021 4:41:20 AM ?  ? ?  ? ? ? ? EKG Interpretation ? ?Date/Time:  Friday December 02 2021 04:44:07 EDT ?Ventricular Rate:  67 ?PR Interval:  154 ?QRS Duration: 102 ?QT Interval:  548 ?QTC Calculation: 579 ?R Axis:   9 ?Text Interpretation: Sinus rhythm Probable left atrial enlargement Nonspecific T abnormalities, diffuse leads Prolonged QT interval 12 Lead; [place] Confirmed by Pryor Curia [phone]) on 12/02/2021 4:45:13 AM ?  ? ?  ? ? ? ?RADIOLOGY: ?My personal review and interpretation of imaging: Chest x-ray shows pulmonary edema. ? ?I have personally reviewed all radiology reports.   ?DG Chest Portable 1 View ? ?Result Date: 12/02/2021 ?CLINICAL DATA:  76 year old  female woke with chest pain and shortness of breath. EXAM: PORTABLE CHEST 1 VIEW COMPARISON:  09/27/2021 chest radiographs and earlier. FINDINGS: Portable AP upright view at 0459 hours. Stable lung volumes and

## 2021-12-03 DIAGNOSIS — I5031 Acute diastolic (congestive) heart failure: Secondary | ICD-10-CM | POA: Diagnosis not present

## 2021-12-03 LAB — CBC
HCT: 41.1 % (ref 36.0–46.0)
Hemoglobin: 13.2 g/dL (ref 12.0–15.0)
MCH: 31.1 pg (ref 26.0–34.0)
MCHC: 32.1 g/dL (ref 30.0–36.0)
MCV: 96.9 fL (ref 80.0–100.0)
Platelets: 246 10*3/uL (ref 150–400)
RBC: 4.24 MIL/uL (ref 3.87–5.11)
RDW: 14.9 % (ref 11.5–15.5)
WBC: 3.9 10*3/uL — ABNORMAL LOW (ref 4.0–10.5)
nRBC: 0 % (ref 0.0–0.2)

## 2021-12-03 LAB — BASIC METABOLIC PANEL
Anion gap: 7 (ref 5–15)
BUN: 20 mg/dL (ref 8–23)
CO2: 27 mmol/L (ref 22–32)
Calcium: 8.8 mg/dL — ABNORMAL LOW (ref 8.9–10.3)
Chloride: 105 mmol/L (ref 98–111)
Creatinine, Ser: 0.91 mg/dL (ref 0.44–1.00)
GFR, Estimated: >60 mL/min (ref >60)
Glucose, Bld: 111 mg/dL — ABNORMAL HIGH (ref 70–99)
Potassium: 3.5 mmol/L (ref 3.5–5.1)
Sodium: 139 mmol/L (ref 135–145)

## 2021-12-03 NOTE — Progress Notes (Signed)
? ?PROGRESS NOTE ? ?Tina Fields  ZOX:[id number]: DOB: 02/25/1946 DOA: 12/02/2021 ?PCP: Oswaldo ConroyBender, Abby Daneele, MD ? ?HPI/Recap of past 24 hours: ?Tina Fields  is a 76 y.o. female with medical history significant for rheumatoid arthritis, GERD, hypertension, CHF, coronary artery disease, obstructive sleep apnea who presents to the ER for evaluation of a 1 week history of progressively worsening weakness, fatigue and shortness of breath. ?Patient states that her symptoms started about a week ago and have progressively worsened prompting her visit to her primary care provider who asked her to make an appointment to see her cardiologist. ?She has not been able to see her cardiologist and has an appointment for December 07, 2021. ?She presented to the ER because her shortness of breath has continued to worsen and is present both at rest and with exertion.  She also has lower extremity swelling, a cough productive of clear phlegm and sore throat.  She was admitted for acute on chronic diastolic CHF and was started on IV diuretics. ? ?12/03/2021: Patient was seen and examined at bedside.  Endorses dyspnea with minimal exertion.  No chest pain. ? ? ? ?Assessment/Plan: ?Principal Problem: ?  Acute diastolic CHF (congestive heart failure) (HCC) ?Active Problems: ?  HTN (hypertension) ?  GERD (gastroesophageal reflux disease) ?  Rheumatoid arthritis, unspecified (HCC) ?  Coronary artery disease involving native coronary artery of native heart without angina pectoris ?  OSA (obstructive sleep apnea) ? ?Acute diastolic CHF (congestive heart failure) (HCC) ?Patient presents for evaluation of fatigue and worsening shortness of breath and imaging shows findings suggestive of pulmonary edema. ?BNP greater than 800.  Post reviewed chest x-ray done on admission which shows increase in pulmonary vascularity suggestive of pulmonary edema. ?Continue IV diuretics, IV Lasix 40 mg daily.  States she stopped taking Toprol-XL,  discontinued. ?Continue Entresto and spironolactone ?  ?OSA (obstructive sleep apnea) ?Stable ?CPAP nightly ?  ?Coronary artery disease involving native coronary artery of native heart without angina pectoris ?Stable, she denies any anginal symptoms. ?Continue Imdur, aspirin and statins ?  ?Rheumatoid arthritis, unspecified (HCC) ?Stable ?Continue home methotrexate ?  ?GERD (gastroesophageal reflux disease) ?Stable ?Continue home PPI ?  ?HTN (hypertension) ?Blood pressure is at goal less than 130/80, at 125/65. ?Continue home regimen. ?  ?  ?  ?  ? Advance Care Planning:   Code Status: DNR  ?  ?Consults: Cardiology. ?  ?Family Communication: None at bedside. ? ? ?Status is: Inpatient ?Patient requires at least 2 midnights for further evaluation and treatment of present condition. ? ? ? ?Objective: ?Vitals:  ? 12/02/21 2352 12/03/21 0337 12/03/21 0819 12/03/21 1132  ?BP: (!) 104/53 92/64 131/63 125/65  ?Pulse: 75 66 68 72  ?Resp: 20 18 20 18   ?Temp: 97.8 ?F (36.6 ?C) 98.2 ?F (36.8 ?C) 98.4 ?F (36.9 ?C) 98.4 ?F (36.9 ?C)  ?TempSrc:      ?SpO2: 93% 93% 96% 95%  ?Weight:  76.1 kg    ?Height:      ? ? ?Intake/Output Summary (Last 24 hours) at 12/03/2021 1541 ?Last data filed at 12/03/2021 1500 ?Gross per 24 hour  ?Intake 477 ml  ?Output 1000 ml  ?Net -523 ml  ? ?Filed Weights  ? 12/02/21 0741 12/02/21 1609 12/03/21 0337  ?Weight: 77.1 kg 76.2 kg 76.1 kg  ? ? ?Exam: ? ?General: 76 y.o. year-old female well developed well nourished in no acute distress.  Alert and oriented x3. ?Cardiovascular: Regular rate and rhythm with no rubs or gallops.  No  thyromegaly or JVD noted.   ?Respiratory: Mild diffuse rales bilaterally.  Poor inspiratory effort. ?Abdomen: Soft nontender nondistended with normal bowel sounds x4 quadrants. ?Musculoskeletal: 1+ pitting edema in lower extremities bilaterally.   ?Skin: No ulcerative lesions noted or rashes, ?Psychiatry: Mood is appropriate for condition and setting ? ? ?Data Reviewed: ?CBC: ?Recent  Labs  ?Lab 12/02/21 ?1121 12/03/21 ?6244  ?WBC 4.0 3.9*  ?NEUTROABS 2.8  --   ?HGB 12.7 13.2  ?HCT 39.3 41.1  ?MCV 97.0 96.9  ?PLT 237 246  ? ?Basic Metabolic Panel: ?Recent Labs  ?Lab 12/02/21 ?6950 12/03/21 ?7225  ?NA 141 139  ?K 4.1 3.5  ?CL 108 105  ?CO2 24 27  ?GLUCOSE 119* 111*  ?BUN 17 20  ?CREATININE 0.90 0.91  ?CALCIUM 8.5* 8.8*  ?MG 1.7  --   ? ?GFR: ?Estimated Creatinine Clearance: 53.1 mL/min (by C-G formula based on SCr of 0.91 mg/dL). ?Liver Function Tests: ?No results for input(s): AST, ALT, ALKPHOS, BILITOT, PROT, ALBUMIN in the last 168 hours. ?No results for input(s): LIPASE, AMYLASE in the last 168 hours. ?No results for input(s): AMMONIA in the last 168 hours. ?Coagulation Profile: ?No results for input(s): INR, PROTIME in the last 168 hours. ?Cardiac Enzymes: ?No results for input(s): CKTOTAL, CKMB, CKMBINDEX, TROPONINI in the last 168 hours. ?BNP (last 3 results) ?No results for input(s): PROBNP in the last 8760 hours. ?HbA1C: ?No results for input(s): HGBA1C in the last 72 hours. ?CBG: ?No results for input(s): GLUCAP in the last 168 hours. ?Lipid Profile: ?No results for input(s): CHOL, HDL, LDLCALC, TRIG, CHOLHDL, LDLDIRECT in the last 72 hours. ?Thyroid Function Tests: ?No results for input(s): TSH, T4TOTAL, FREET4, T3FREE, THYROIDAB in the last 72 hours. ?Anemia Panel: ?No results for input(s): VITAMINB12, FOLATE, FERRITIN, TIBC, IRON, RETICCTPCT in the last 72 hours. ?Urine analysis: ?   ?Component Value Date/Time  ? COLORURINE AMBER (A) 03/03/2021 0500  ? APPEARANCEUR TURBID (A) 03/03/2021 0500  ? APPEARANCEUR Clear 01/16/2012 0248  ? LABSPEC 1.027 03/03/2021 0500  ? LABSPEC 1.009 01/16/2012 0248  ? PHURINE 5.0 03/03/2021 0500  ? GLUCOSEU NEGATIVE 03/03/2021 0500  ? GLUCOSEU Negative 01/16/2012 0248  ? HGBUR NEGATIVE 03/03/2021 0500  ? BILIRUBINUR NEGATIVE 03/03/2021 0500  ? BILIRUBINUR Negative 01/16/2012 0248  ? KETONESUR NEGATIVE 03/03/2021 0500  ? PROTEINUR 30 (A) 03/03/2021 0500  ?  NITRITE NEGATIVE 03/03/2021 0500  ? LEUKOCYTESUR MODERATE (A) 03/03/2021 0500  ? LEUKOCYTESUR Negative 01/16/2012 0248  ? ?Sepsis Labs: ?@LABRCNTIP (procalcitonin:4,lacticidven:4) ? ?)No results found for this or any previous visit (from the past 240 hour(s)).  ? ? ?Studies: ?No results found. ? ?Scheduled Meds: ? aspirin EC  81 mg Oral Daily  ? atorvastatin  40 mg Oral q morning  ? enoxaparin (LOVENOX) injection  40 mg Subcutaneous Q24H  ? folic acid  1 mg Oral Daily  ? furosemide  40 mg Intravenous Daily  ? isosorbide mononitrate  30 mg Oral Daily  ? [START ON 12/06/2021] methotrexate  17.5 mg Oral Weekly  ? montelukast  10 mg Oral QHS  ? sacubitril-valsartan  1 tablet Oral Daily  ? sodium chloride flush  3 mL Intravenous Q12H  ? cyanocobalamin  1,000 mcg Oral Daily  ? ? ?Continuous Infusions: ? sodium chloride    ? ? ? LOS: 1 day  ? ? ? ?Darlin Drop, MD ?[facility] ?Pager [phone] ? ?If 7PM-7AM, please contact night-coverage [email or web address] ?Password [email or web address] ?12/03/2021, 3:41 PM  ?  ?

## 2021-12-03 NOTE — Progress Notes (Signed)
Pt refused CPAP tonight. Pt stated she has not wore a cpap in years.  ?

## 2021-12-04 DIAGNOSIS — I5031 Acute diastolic (congestive) heart failure: Secondary | ICD-10-CM | POA: Diagnosis not present

## 2021-12-04 LAB — BASIC METABOLIC PANEL
Anion gap: 6 (ref 5–15)
BUN: 22 mg/dL (ref 8–23)
CO2: 28 mmol/L (ref 22–32)
Calcium: 8.5 mg/dL — ABNORMAL LOW (ref 8.9–10.3)
Chloride: 103 mmol/L (ref 98–111)
Creatinine, Ser: 0.92 mg/dL (ref 0.44–1.00)
GFR, Estimated: >60 mL/min (ref >60)
Glucose, Bld: 104 mg/dL — ABNORMAL HIGH (ref 70–99)
Potassium: 3.2 mmol/L — ABNORMAL LOW (ref 3.5–5.1)
Sodium: 137 mmol/L (ref 135–145)

## 2021-12-04 LAB — CBC
HCT: 40.2 % (ref 36.0–46.0)
Hemoglobin: 13.1 g/dL (ref 12.0–15.0)
MCH: 31.4 pg (ref 26.0–34.0)
MCHC: 32.6 g/dL (ref 30.0–36.0)
MCV: 96.4 fL (ref 80.0–100.0)
Platelets: 262 10*3/uL (ref 150–400)
RBC: 4.17 MIL/uL (ref 3.87–5.11)
RDW: 14.8 % (ref 11.5–15.5)
WBC: 4.4 10*3/uL (ref 4.0–10.5)
nRBC: 0 % (ref 0.0–0.2)

## 2021-12-04 LAB — MAGNESIUM: Magnesium: 1.7 mg/dL (ref 1.7–2.4)

## 2021-12-04 LAB — PHOSPHORUS: Phosphorus: 4.1 mg/dL (ref 2.5–4.6)

## 2021-12-04 MED ORDER — POTASSIUM CHLORIDE CRYS ER 20 MEQ PO TBCR
40.0000 meq | EXTENDED_RELEASE_TABLET | Freq: Two times a day (BID) | ORAL | Status: AC
  Administered 2021-12-04 (×2): 40 meq via ORAL
  Filled 2021-12-04 (×2): qty 2

## 2021-12-04 MED ORDER — PANTOPRAZOLE SODIUM 40 MG PO TBEC
40.0000 mg | DELAYED_RELEASE_TABLET | Freq: Every day | ORAL | Status: DC
  Administered 2021-12-04 – 2021-12-05 (×2): 40 mg via ORAL
  Filled 2021-12-04 (×2): qty 1

## 2021-12-04 MED ORDER — MAGNESIUM OXIDE -MG SUPPLEMENT 400 (240 MG) MG PO TABS
800.0000 mg | ORAL_TABLET | Freq: Two times a day (BID) | ORAL | Status: AC
  Administered 2021-12-04 (×2): 800 mg via ORAL
  Filled 2021-12-04 (×2): qty 2

## 2021-12-04 NOTE — Progress Notes (Incomplete)
Assumed care of pt at 1900. A&O x4. RA. C/o arthritis pain relieved w/ tylenol overnight. Medication administration per MAR. Full assessment as documented. Call bell within reach, pt making needs known. Comfort and safety maintained.  ?

## 2021-12-04 NOTE — TOC Progression Note (Addendum)
Transition of Care (TOC) - Progression Note  ? ? ?Patient Details  ?Name: Tina Fields ?MRN: [id number] ?Date of Birth: 12-29-1945 ? ?Transition of Care (TOC) CM/SW Contact  ?Izola Price, RN ?Phone Number: ?12/04/2021, 1:56 PM ? ?Clinical Narrative:   4/2: PT recommends HH for balance, mobility issues. Spoke with patient and did not have a preference for [facility] agency. Has used one in the past but does not recall what agency. [facility] has tentatively accepted. Requested to add RN to follow for CHF issues. No DME recommendations in evaluation. DC 12/05/21. Simmie Davies RN CM  ? ? ? ?  ?  ? ?Expected Discharge Plan and Services ?  ?  ?  ?  ?  ?                ?  ?  ?  ?  ?  ?  ?  ?  ?  ?  ? ? ?Social Determinants of Health (SDOH) Interventions ?  ? ?Readmission Risk Interventions ?   ? View : No data to display.  ?  ?  ?  ? ? ?

## 2021-12-04 NOTE — Progress Notes (Signed)
? ?PROGRESS NOTE ? ?Jenifer Crossett AUQ:[id number] DOB: Apr 06, 1946 DOA: 12/02/2021 ?PCP: Oswaldo Conroy, MD ? ?HPI/Recap of past 24 hours: ?Marabeth Lemire is a 76 y.o. female with medical history significant for rheumatoid arthritis, GERD, hypertension, chronic diastolic CHF, coronary artery disease, obstructive sleep apnea noncompliant with CPAP, who presented to the ER for evaluation of a 1 week history of progressively worsening weakness, fatigue and dyspnea.  Associated with bilateral lower extremity edema. ? ?Work-up revealed acute on chronic diastolic CHF, was started on IV diuretics and cardiology was consulted. ? ?12/04/2021: Seen by cardiology, no new complaints.  Ongoing diuresing with improvement of symptomatology.  Plan to discharge on 12/22/2021 on p.o. Lasix 20 mg daily, with follow-up with cardiology, Dr. Darrold Junker, in 1 to 2 weeks. ? ? ?Assessment/Plan: ?Principal Problem: ?  Acute diastolic CHF (congestive heart failure) (HCC) ?Active Problems: ?  HTN (hypertension) ?  GERD (gastroesophageal reflux disease) ?  Rheumatoid arthritis, unspecified (HCC) ?  Coronary artery disease involving native coronary artery of native heart without angina pectoris ?  OSA (obstructive sleep apnea) ? ?Acute on chronic diastolic CHF (congestive heart failure) (HCC) ?Patient presents for evaluation of fatigue and worsening shortness of breath. ?BNP elevated greater than 800 on admission.  Chest x-ray with findings suggestive of pulmonary edema.  Bilateral lower extremity pitting edema. ?Started on IV diuretics, continue IV Lasix 40 mg daily. ?Continue Imdur 30 mg daily, Entresto 24-26, 1 tablet daily as recommended by cardiology. ?Net I&O -843 cc ?  ?OSA (obstructive sleep apnea) ?Patient declined CPAP at night, states she has not worn CPAP in years. ? ?Coronary artery disease involving native coronary artery of native heart without angina pectoris ?Stable, she denies any anginal symptoms. ?Continue Imdur,  aspirin and statins ?  ?Rheumatoid arthritis, unspecified (HCC) ?Stable ?Continue home methotrexate ?  ?GERD (gastroesophageal reflux disease) ?Stable ?Continue home PPI ?  ?HTN (hypertension) ?Blood pressure is at goal less than 130/80, at 115/76. ?Continue regimen as recommended by cardiology. ? ?Hypokalemia ?Serum potassium 3.2, goal greater than 4.0. ?Repleted orally with 40 mEq twice daily x2 doses. ?Magnesium 1.7, repleted, goal greater than 2.0. ? ?Physical debility/ambulatory dysfunction ?PT assessed and recommended PT OT ?Continue PT OT with assistance and fall precautions. ?  ?  ?  ? Advance Care Planning:   Code Status: DNR  ?  ?Consults: Cardiology. ?  ?Family Communication: None at bedside. ? ? ?Status is: Inpatient ?Patient requires at least 2 midnights for further evaluation and treatment of present condition. ? ? ? ?Objective: ?Vitals:  ? 12/04/21 0538 12/04/21 0759 12/04/21 1152 12/04/21 1630  ?BP:  122/83 96/71 115/76  ?Pulse:  70 91 75  ?Resp:  18 18 16   ?Temp:  98.4 ?F (36.9 ?C) 97.9 ?F (36.6 ?C) 97.6 ?F (36.4 ?C)  ?TempSrc:  Oral    ?SpO2:  98% 96% 96%  ?Weight: 75.6 kg     ?Height:      ? ? ?Intake/Output Summary (Last 24 hours) at 12/04/2021 1703 ?Last data filed at 12/04/2021 1501 ?Gross per 24 hour  ?Intake 480 ml  ?Output 800 ml  ?Net -320 ml  ? ?Filed Weights  ? 12/02/21 1609 12/03/21 0337 12/04/21 0538  ?Weight: 76.2 kg 76.1 kg 75.6 kg  ? ? ?Exam: ? ?General: 76 y.o. year-old female well-developed well-nourished in no process.  She is alert and oriented x3. ?Cardiovascular: Regular rate and rhythm no rubs or gallops.   ?Respiratory: Clear to auscultation with no wheezes or rales.  Poor inspiratory effort. ?Abdomen: Soft nontender normal bowel sounds present. ?Musculoskeletal: 1+ pitting edema in lower extremities bilaterally.   ?Skin: No rashes or ulcerative lesions noted. ?Psychiatry: Mood is appropriate for condition and setting. ?Neuro: Awake and alert.  Nonfocal. ? ? ?Data  Reviewed: ?CBC: ?Recent Labs  ?Lab 12/02/21 ?5465 12/03/21 ?6812 12/04/21 ?7517  ?WBC 4.0 3.9* 4.4  ?NEUTROABS 2.8  --   --   ?HGB 12.7 13.2 13.1  ?HCT 39.3 41.1 40.2  ?MCV 97.0 96.9 96.4  ?PLT 237 246 262  ? ?Basic Metabolic Panel: ?Recent Labs  ?Lab 12/02/21 ?0017 12/03/21 ?4944 12/04/21 ?9675  ?NA 141 139 137  ?K 4.1 3.5 3.2*  ?CL 108 105 103  ?CO2 24 27 28   ?GLUCOSE 119* 111* 104*  ?BUN 17 20 22   ?CREATININE 0.90 0.91 0.92  ?CALCIUM 8.5* 8.8* 8.5*  ?MG 1.7  --  1.7  ?PHOS  --   --  4.1  ? ?GFR: ?Estimated Creatinine Clearance: 52.3 mL/min (by C-G formula based on SCr of 0.92 mg/dL). ?Liver Function Tests: ?No results for input(s): AST, ALT, ALKPHOS, BILITOT, PROT, ALBUMIN in the last 168 hours. ?No results for input(s): LIPASE, AMYLASE in the last 168 hours. ?No results for input(s): AMMONIA in the last 168 hours. ?Coagulation Profile: ?No results for input(s): INR, PROTIME in the last 168 hours. ?Cardiac Enzymes: ?No results for input(s): CKTOTAL, CKMB, CKMBINDEX, TROPONINI in the last 168 hours. ?BNP (last 3 results) ?No results for input(s): PROBNP in the last 8760 hours. ?HbA1C: ?No results for input(s): HGBA1C in the last 72 hours. ?CBG: ?No results for input(s): GLUCAP in the last 168 hours. ?Lipid Profile: ?No results for input(s): CHOL, HDL, LDLCALC, TRIG, CHOLHDL, LDLDIRECT in the last 72 hours. ?Thyroid Function Tests: ?No results for input(s): TSH, T4TOTAL, FREET4, T3FREE, THYROIDAB in the last 72 hours. ?Anemia Panel: ?No results for input(s): VITAMINB12, FOLATE, FERRITIN, TIBC, IRON, RETICCTPCT in the last 72 hours. ?Urine analysis: ?   ?Component Value Date/Time  ? COLORURINE AMBER (A) 03/03/2021 0500  ? APPEARANCEUR TURBID (A) 03/03/2021 0500  ? APPEARANCEUR Clear 01/16/2012 0248  ? LABSPEC 1.027 03/03/2021 0500  ? LABSPEC 1.009 01/16/2012 0248  ? PHURINE 5.0 03/03/2021 0500  ? GLUCOSEU NEGATIVE 03/03/2021 0500  ? GLUCOSEU Negative 01/16/2012 0248  ? HGBUR NEGATIVE 03/03/2021 0500  ? BILIRUBINUR  NEGATIVE 03/03/2021 0500  ? BILIRUBINUR Negative 01/16/2012 0248  ? KETONESUR NEGATIVE 03/03/2021 0500  ? PROTEINUR 30 (A) 03/03/2021 0500  ? NITRITE NEGATIVE 03/03/2021 0500  ? LEUKOCYTESUR MODERATE (A) 03/03/2021 0500  ? LEUKOCYTESUR Negative 01/16/2012 0248  ? ?Sepsis Labs: ?@LABRCNTIP (procalcitonin:4,lacticidven:4) ? ?)No results found for this or any previous visit (from the past 240 hour(s)).  ? ? ?Studies: ?No results found. ? ?Scheduled Meds: ? aspirin EC  81 mg Oral Daily  ? atorvastatin  40 mg Oral q morning  ? enoxaparin (LOVENOX) injection  40 mg Subcutaneous Q24H  ? folic acid  1 mg Oral Daily  ? furosemide  40 mg Intravenous Daily  ? isosorbide mononitrate  30 mg Oral Daily  ? magnesium oxide  800 mg Oral BID  ? [START ON 12/06/2021] methotrexate  17.5 mg Oral Weekly  ? montelukast  10 mg Oral QHS  ? potassium chloride  40 mEq Oral BID  ? sacubitril-valsartan  1 tablet Oral Daily  ? sodium chloride flush  3 mL Intravenous Q12H  ? cyanocobalamin  1,000 mcg Oral Daily  ? ? ?Continuous Infusions: ? sodium chloride    ? ? ?  LOS: 2 days  ? ? ? ?Darlin Drop, MD ?[facility] ?Pager [phone] ? ?If 7PM-7AM, please contact night-coverage ?[email or web address] ?Password [email or web address] ?12/04/2021, 5:03 PM  ?  ?

## 2021-12-04 NOTE — Consult Note (Signed)
[facility] Cardiology ? ?CARDIOLOGY CONSULT NOTE  ?Patient ID: ?Tina Fields ?MRN: [id number] ?DOB/AGE: 1945-12-06 76 y.o. ? ?Admit date: 12/02/2021 ?Referring Physician Margo Aye ?Primary Physician Bender ?Primary Cardiologist  ?Reason for Consultation acute diastolic congestive heart failure ? ?HPI: 76 year old female referred for evaluation of acute on chronic diastolic congestive heart failure.  The patient has a history of chronic diastolic congestive heart failure, insignificant coronary artery disease by prior cardiac catheterization 06/11/2005 and 05/20/2008, essential hypertension and obstructive sleep apnea.  2D echocardiogram 03/03/2021 revealed LV EF 60-65%.  The patient had a very similar presentation 09/27/2021 to [facility] ED which resolved after 1 dose of IV furosemide and was not admitted.  She presents to [facility] ED/09/2021 with 1 week history of worsening exertional dyspnea with lower extremity edema.  Admission labs notable for normal high sensitive troponin (10, 10), and mildly elevated BNP 826.8.  ECG revealed sinus rhythm with nonspecific T wave abnormality laterally.  Chest x-ray revealed mild pulmonary edema.  Patient was treated with intravenous Lasix with diuresis and overall clinical improvement. ? ?Review of systems complete and found to be negative unless listed above  ? ? ? ?Past Medical History:  ?Diagnosis Date  ? Aortic atherosclerosis (HCC)   ? Asthma   ? Coronary artery disease   ? Dyspnea   ? Edema of both legs   ? GERD (gastroesophageal reflux disease)   ? HFrEF (heart failure with reduced ejection fraction) (HCC)   ? History of 2019 novel coronavirus disease (COVID-19) 10/28/2020  ? Hypertension   ? Myocardial infarction [facility])   ? OSA (obstructive sleep apnea)   ? noncompliant with nocturnal PAP therapy  ? RA (rheumatoid arthritis) (HCC)   ?  ?Past Surgical History:  ?Procedure Laterality Date  ? BREAST SURGERY    ? CARDIAC CATHETERIZATION Left 06/21/2005  ? Moderate ostial LCx and LAD disease;  LVEF 45%; Location: [facility]; Surgeon: Adrian Blackwater, MD  ? CARDIAC CATHETERIZATION Left 05/20/2008  ? No occlusive CAD; LVEF 65%; Location: [facility]; Surgeon: Rudean Hitt, MD  ? COLONOSCOPY    ? ESOPHAGOGASTRODUODENOSCOPY    ? TUBAL LIGATION    ?  ?Medications Prior to Admission  ?Medication Sig Dispense Refill Last Dose  ? acetaminophen (TYLENOL) 650 MG CR tablet Take 1,300 mg by mouth as needed for pain.   prn at prn  ? albuterol (PROVENTIL) (2.5 MG/3ML) 0.083% nebulizer solution Take 2.5 mg by nebulization every 6 (six) hours as needed for wheezing or shortness of breath.   prn at prn  ? albuterol (VENTOLIN HFA) 108 (90 Base) MCG/ACT inhaler Inhale 1-2 puffs into the lungs every 6 (six) hours as needed for wheezing or shortness of breath.   prn at prn  ? atorvastatin (LIPITOR) 40 MG tablet Take 40 mg by mouth every morning.   12/01/2021 at 0900  ? [employer] VITAMIN B12 1000 MCG tablet Take 1,000 mcg by mouth daily.   12/01/2021 at 0900  ? ENTRESTO 24-26 MG Take 1 tablet by mouth daily.   12/01/2021 at 0900  ? fluticasone (FLONASE) 50 MCG/ACT nasal spray Place 2 sprays into the nose daily as needed for allergies.   prn at prn  ? folic acid (FOLVITE) 1 MG tablet Take 1 mg by mouth daily.   12/01/2021 at 0900  ? furosemide (LASIX) 40 MG tablet Take 40 mg by mouth daily.   12/01/2021 at 0900  ? ibuprofen (ADVIL) 200 MG tablet Take 200 mg by mouth as needed.   prn at prn  ? isosorbide  mononitrate (IMDUR) 30 MG 24 hr tablet Take 30 mg by mouth daily.   12/01/2021 at 0900  ? methotrexate (RHEUMATREX) 2.5 MG tablet Take 7 tablets by mouth every Tuesday.   11/29/2021 at unk  ? MIRALAX 17 GM/SCOOP powder Take 17 g by mouth daily as needed for moderate constipation. Dissolve 17 gm in 8 ounces of water and drink   prn at prn  ? montelukast (SINGULAIR) 10 MG tablet Take 10 mg by mouth at bedtime.   12/01/2021 at 0900  ? omeprazole (PRILOSEC) 20 MG capsule Take 20 mg by mouth 2 (two) times daily before a meal.   prn at prn  ? aspirin EC 81 MG  EC tablet Take 1 tablet (81 mg total) by mouth daily. Swallow whole. (Patient not taking: Reported on 12/02/2021) 30 tablet 11 Not Taking  ? atorvastatin (LIPITOR) 80 MG tablet Take 80 mg by mouth daily. (Patient not taking: Reported on 12/02/2021)   Not Taking  ? budesonide-formoterol (SYMBICORT) 80-4.5 MCG/ACT inhaler Inhale 2 puffs into the lungs 2 (two) times daily as needed (asthma). (Patient not taking: Reported on 12/02/2021)   Not Taking  ? cyclobenzaprine (FLEXERIL) 5 MG tablet Take 5 mg by mouth See admin instructions. Take 5 mg by mouth once daily at bedtime as needed for muscle pain (Patient not taking: Reported on 12/02/2021)   Not Taking  ? HUMIRA PEN 40 MG/0.4ML PNKT Inject 40 mg into the skin every 14 (fourteen) days.     ? HYDROcodone-acetaminophen (NORCO/VICODIN) 5-325 MG tablet Take 1 tablet by mouth every 4 (four) hours as needed for moderate pain. (Patient not taking: Reported on 12/02/2021) 20 tablet 0 Completed Course  ? metoprolol succinate (TOPROL-XL) 25 MG 24 hr tablet Take 25 mg by mouth daily. (Patient not taking: Reported on 12/02/2021)   Not Taking  ? potassium chloride 20 MEQ TBCR Take 10 mEq by mouth 2 (two) times daily for 5 days. (Patient not taking: Reported on 12/02/2021) 10 tablet 0 Completed Course  ? spironolactone (ALDACTONE) 25 MG tablet Take 12.5 mg by mouth daily. (Patient not taking: Reported on 12/02/2021)   Not Taking  ? Vitamin D, Ergocalciferol, (DRISDOL) 1.25 MG (50000 UNIT) CAPS capsule Take 50,000 Units by mouth every Sunday. (Patient not taking: Reported on 12/02/2021)   Not Taking  ? ?Social History  ? ?Socioeconomic History  ? Marital status: Widowed  ?  Spouse name: Not on file  ? Number of children: Not on file  ? Years of education: Not on file  ? Highest education level: Not on file  ?Occupational History  ? Not on file  ?Tobacco Use  ? Smoking status: Former  ?  Types: Cigarettes  ?  Quit date: 01/09/1995  ?  Years since quitting: 26.9  ? Smokeless tobacco: Never   ?Vaping Use  ? Vaping Use: Never used  ?Substance and Sexual Activity  ? Alcohol use: No  ? Drug use: No  ? Sexual activity: Yes  ?  Birth control/protection: None  ?Other Topics Concern  ? Not on file  ?Social History Narrative  ? Adult son  ? ?Social Determinants of Health  ? ?Financial Resource Strain: Not on file  ?Food Insecurity: Not on file  ?Transportation Needs: Not on file  ?Physical Activity: Not on file  ?Stress: Not on file  ?Social Connections: Not on file  ?Intimate Partner Violence: Not on file  ?  ?Family History  ?Problem Relation Age of Onset  ? CAD Mother   ?  Diabetes Mother   ? Hypertension Mother   ? CAD Father   ? Diabetes Father   ? Hypertension Father   ? Stroke Father   ?  ? ? ?Review of systems complete and found to be negative unless listed above  ? ? ? ? ?PHYSICAL EXAM ? ?General: Well developed, well nourished, in no acute distress ?HEENT:  Normocephalic and atramatic ?Neck:  No JVD.  ?Lungs: Clear bilaterally to auscultation and percussion. ?Heart: HRRR . Normal S1 and S2 without gallops or murmurs.  ?Abdomen: Bowel sounds are positive, abdomen soft and non-tender  ?Msk:  Back normal, normal gait. Normal strength and tone for age. ?Extremities: No clubbing, cyanosis or edema.   ?Neuro: Alert and oriented X 3. ?Psych:  Good affect, responds appropriately ? ?Labs: ?  ?Lab Results  ?Component Value Date  ? WBC 4.4 12/04/2021  ? HGB 13.1 12/04/2021  ? HCT 40.2 12/04/2021  ? MCV 96.4 12/04/2021  ? PLT 262 12/04/2021  ?  ?Recent Labs  ?Lab 12/04/21 ?0141  ?NA 137  ?K 3.2*  ?CL 103  ?CO2 28  ?BUN 22  ?CREATININE 0.92  ?CALCIUM 8.5*  ?GLUCOSE 104*  ? ?Lab Results  ?Component Value Date  ? CKTOTAL 150 03/15/2013  ? CKMB 1.3 03/15/2013  ? TROPONINI <0.03 09/12/2018  ?  ?Lab Results  ?Component Value Date  ? CHOL 154 03/03/2021  ? CHOL 174 08/21/2017  ? ?Lab Results  ?Component Value Date  ? HDL 27 (L) 03/03/2021  ? HDL 28 (L) 08/21/2017  ? ?Lab Results  ?Component Value Date  ? LDLCALC 111 (H)  03/03/2021  ? LDLCALC 108 (H) 08/21/2017  ? ?Lab Results  ?Component Value Date  ? TRIG 82 03/03/2021  ? TRIG 190 (H) 08/21/2017  ? ?Lab Results  ?Component Value Date  ? CHOLHDL 5.7 03/03/2021  ? CHOLHDL 6.2

## 2021-12-04 NOTE — Evaluation (Signed)
Physical Therapy Evaluation ?Patient Details ?Name: Tina GroveJackie Fields  ?MRN: [id number] ?DOB: 11/06/1945 ?Today's Date: 12/04/2021 ? ?History of Present Illness ? Pt is a 76 y.o. female who presents to the ED w/ concerns for chest pain and SOB. Currently admitted for Acute CHF. PmHx: Asthma, CAD, Dyspnea, GERD, HFrEF, HTN, MI, RA. ?  ?Clinical Impression ? Pt awake and alert resting in bed upon PT entrance into room for evaluation today. Pt denies any complaints of pain and is willing to work w/ PT today. She reports prior to hospitalization she lives w/ her son in a 2-story home; states she lives on the main level and uses a RW for community ambulation. Is independent w/ all ADLs, but does receive assistance for her son PRN. ? ?Pt is able to perform all bed mobility and progress to standing w/ MODI. She is able to ambulate w/o AD ~5950ft, notable staggering gait pattern. Per Pt she does stagger at home, and relies on furniture to get around for extra assistance; states she does use RW out in community. Pt was given RW to use for the remainder of ambulation, significant improvement w/ stability, balance, and gait speed. Pt will benefit from continued skilled PT in order to increase LE strength, improve functional balance/coordination, and restore PLOF. Current discharge recommendation to HHPT is appropriate due to the level of assistance required by the patient to ensure safety and improve overall function. ?   ?   ? ?Recommendations for follow up therapy are one component of a multi-disciplinary discharge planning process, led by the attending physician.  Recommendations may be updated based on patient status, additional functional criteria and insurance authorization. ? ?Follow Up Recommendations Home health PT ? ?  ?Assistance Recommended at Discharge Frequent or constant Supervision/Assistance  ?Patient can return home with the following ? A little help with walking and/or transfers;A little help with  bathing/dressing/bathroom;Assistance with cooking/housework;Assist for transportation;Help with stairs or ramp for entrance ? ?  ?Equipment Recommendations    ?Recommendations for Other Services ?    ?  ?Functional Status Assessment    ? ?  ?Precautions / Restrictions Precautions ?Precautions: Fall ?Restrictions ?Weight Bearing Restrictions: No  ? ?  ? ?Mobility ? Bed Mobility ?Overal bed mobility: Modified Independent ?  ?  ?  ?  ?  ?  ?  ?  ? ?Transfers ?Overall transfer level: Modified independent ?  ?  ?  ?  ?  ?  ?  ?  ?  ?  ? ?Ambulation/Gait ?Ambulation/Gait assistance: Supervision ?Gait Distance (Feet): 160 Feet (7050ft w/ DME; 15110ft w/ RW) ?Assistive device: Rolling walker (2 wheels) ?Gait Pattern/deviations: Step-through pattern, Decreased step length - right, Decreased step length - left, Decreased stride length ?Gait velocity: decreased ?  ?  ?General Gait Details: staggering w/o AD; pt reports she is wobbly/staggers at baseline; uses RW for community ambulation. ? ?Stairs ?  ?  ?  ?  ?  ? ?Wheelchair Mobility ?  ? ?Modified Rankin (Stroke Patients Only) ?  ? ?  ? ?Balance Overall balance assessment: Modified Independent ?  ?  ?  ?  ?  ?  ?  ?  ?  ?  ?  ?  ?  ?  ?  ?  ?  ?  ?   ? ? ? ?Pertinent Vitals/Pain Pain Assessment ?Pain Assessment: No/denies pain  ? ? ?Home Living Family/patient expects to be discharged to:: Private residence ?Living Arrangements: Children ?Available Help at Discharge: Family;Available 24 hours/day ?  Type of Home: House ?Home Access: Stairs to enter ?Entrance Stairs-Rails: None ?Entrance Stairs-Number of Steps: 3 ?  ?Home Layout: Two level;Able to live on main level with bedroom/bathroom ?Home Equipment: Agricultural consultant (2 wheels) ?   ?  ?Prior Function Prior Level of Function : Independent/Modified Independent ?  ?  ?  ?  ?  ?  ?  ?  ?  ? ? ?Hand Dominance  ?   ? ?  ?Extremity/Trunk Assessment  ? Upper Extremity Assessment ?Upper Extremity Assessment: Overall WFL for tasks  assessed ?  ? ?Lower Extremity Assessment ?Lower Extremity Assessment: Overall WFL for tasks assessed;Generalized weakness ?  ? ?   ?Communication  ?    ?Cognition Arousal/Alertness: Awake/alert ?Behavior During Therapy: [facility] for tasks assessed/performed ?Overall Cognitive Status: Within Functional Limits for tasks assessed ?  ?  ?  ?  ?  ?  ?  ?  ?  ?  ?  ?  ?  ?  ?  ?  ?  ?  ?  ? ?  ?General Comments   ? ?  ?Exercises    ? ?Assessment/Plan  ?  ?PT Assessment Patient needs continued PT services  ?PT Problem List Decreased strength;Decreased mobility;Decreased range of motion;Decreased coordination;Decreased activity tolerance;Decreased balance;Cardiopulmonary status limiting activity ? ?   ?  ?PT Treatment Interventions DME instruction;Therapeutic exercise;Gait training;Balance training;Neuromuscular re-education;Stair training;Functional mobility training;Therapeutic activities;Patient/family education   ? ?PT Goals (Current goals can be found in the Care Plan section)  ?Acute Rehab PT Goals ?PT Goal Formulation: With patient ?Time For Goal Achievement: 12/18/21 ?Potential to Achieve Goals: Good ? ?  ?Frequency Min 2X/week ?  ? ? ?Co-evaluation   ?  ?  ?  ?  ? ? ?  ?AM-PAC PT "6 Clicks" Mobility  ?Outcome Measure Help needed turning from your back to your side while in a flat bed without using bedrails?: None ?Help needed moving from lying on your back to sitting on the side of a flat bed without using bedrails?: None ?Help needed moving to and from a bed to a chair (including a wheelchair)?: None ?Help needed standing up from a chair using your arms (e.g., wheelchair or bedside chair)?: None ?Help needed to walk in hospital room?: None ?Help needed climbing 3-5 steps with a railing? : None ?6 Click Score: 24 ? ?  ?End of Session Equipment Utilized During Treatment: Gait belt ?Activity Tolerance: Patient tolerated treatment well ?Patient left: in chair;with call bell/phone within reach ?Nurse Communication:  Mobility status ?PT Visit Diagnosis: Muscle weakness (generalized) (M62.81);Other abnormalities of gait and mobility (R26.89);Unsteadiness on feet (R26.81) ?  ? ?Time: 9735-3299 ?PT Time Calculation (min) (ACUTE ONLY): 14 min ? ? ?Charges:     ?  ?  ?   ?Jeralyn Bennett, SPT ?12/04/2021, 1:37 PM ? ?

## 2021-12-04 NOTE — TOC Initial Note (Signed)
Transition of Care (TOC) - Initial/Assessment Note  ? ? ?Patient Details  ?Name: Tina Fields ?MRN: [id number] ?Date of Birth: 1945/10/25 ? ?Transition of Care (TOC) CM/SW Contact:    ?Izola Price, RN ?Phone Number: ?12/04/2021, 2:18 PM ? ?Clinical Narrative: 4/2: Spoke with patient and TOC participation and discharge planning. Patient lives with adult son full time. Does not have issues obtaining or paying for medications. Cousin transports to appointments/shopping. A few steps into house which PT worked with on steps per patient. PCP and RX in chart confirmed as current. PT recommendations for [facility] and RN added to follow CHF issues. Patient agreed. Simmie Davies RN CM                 ? ? ?Expected Discharge Plan: [place] ?  ? ? ?Patient Goals and CMS Choice ?  ?  ?Choice offered to / list presented to : Patient ? ?Expected Discharge Plan and Services ?Expected Discharge Plan: [place] ?  ?Discharge Planning Services: CM Consult ?  ?Living arrangements for the past 2 months: [place] ?                ?DME Arranged: N/A ?DME Agency: NA ?  ?  ?  ?HH Arranged: PT, RN ?[place] Agency: [facility] ?Date HH Agency Contacted: 12/04/21 ?Time [place]: [id number] spoke with at [place]: Merleen Nicely ? ?Prior Living Arrangements/Services ?Living arrangements for the past 2 months: [place] ?Lives with:: Adult Children ?Patient language and need for interpreter reviewed:: No ?Do you feel safe going back to the place where you live?: Yes      ?Need for Family Participation in Patient Care: Yes (Comment) ?Care giver support system in place?: Yes (comment) ?  ?Criminal Activity/Legal Involvement Pertinent to Current Situation/Hospitalization: No - Comment as needed ? ?Activities of Daily Living ?Home Assistive Devices/Equipment: Gilford Rile (specify type), Eyeglasses ?ADL Screening (condition at time of admission) ?Patient's cognitive ability adequate to  safely complete daily activities?: Yes ?Is the patient deaf or have difficulty hearing?: No ?Does the patient have difficulty seeing, even when wearing glasses/contacts?: No ?Does the patient have difficulty concentrating, remembering, or making decisions?: No ?Patient able to express need for assistance with ADLs?: Yes ?Does the patient have difficulty dressing or bathing?: No ?Independently performs ADLs?: Yes (appropriate for developmental age) ?Does the patient have difficulty walking or climbing stairs?: No ?Weakness of Legs: None ?Weakness of Arms/Hands: None ? ?Permission Sought/Granted ?Permission sought to share information with : Facility Sport and exercise psychologist, Case Manager ?Permission granted to share information with : Yes, Verbal Permission Granted ?   ? Permission granted to share info w AGENCY: [facility] ?   ?   ? ?Emotional Assessment ?Appearance:: Appears stated age ?Attitude/Demeanor/Rapport: Engaged ?Affect (typically observed): Accepting ?Orientation: : Oriented to Self, Oriented to Place, Oriented to  Time, Oriented to Situation ?Alcohol / Substance Use: Not Applicable ?  ? ?Admission diagnosis:  Shortness of breath [R06.02] ?Prolonged Q-T interval on ECG [R94.31] ?Acute diastolic CHF (congestive heart failure) ([place]) [I50.31] ?Nonspecific chest pain [R07.9] ?Acute on chronic congestive heart failure, unspecified heart failure type ([place]) [I50.9] ?Patient Active Problem List  ? Diagnosis Date Noted  ? Acute diastolic CHF (congestive heart failure) ([place]) 12/02/2021  ? Coronary artery disease involving native coronary artery of native heart without angina pectoris 03/15/2021  ? Renal mass 03/15/2021  ? Elevated troponin 03/03/2021  ? NSTEMI (non-ST  elevated myocardial infarction) ([place]) 03/02/2021  ? Acute on chronic combined systolic and diastolic CHF (congestive heart failure) ([place]) 01/02/2021  ? Heart failure with reduced ejection fraction ([place]) 10/28/2020  ? OSA (obstructive  sleep apnea) 10/28/2020  ? Vertigo 01/14/2020  ? Encounter for long-term (current) use of high-risk medication 02/18/2019  ? Mixed hyperlipidemia 12/03/2018  ? Prediabetes 09/03/2018  ? 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  ? HTN (hypertension) 08/21/2017  ? GERD (gastroesophageal reflux disease) 08/21/2017  ? Nodule of right lung 05/21/2014  ? Rheumatoid arthritis, unspecified ([place]) 06/09/2011  ? 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 ?PCP:  Letta Median, MD ?Pharmacy:   ?[id number] - [place], [place] - [address] ?[address] ?[place] [address] ?Phone: [phone] Fax: [phone] ? ? ? ? ?Social Determinants of Health (SDOH) Interventions ?  ? ?Readmission Risk Interventions ? ?  12/04/2021  ?  2:04 PM  ?Readmission Risk Prevention Plan  ?Transportation Screening Complete  ?PCP or Specialist Appt within 5-7 Days Complete  ?Home Care Screening Complete  ?Medication Review (RN CM) Referral to Pharmacy  ? ? ? ?

## 2021-12-05 DIAGNOSIS — I5031 Acute diastolic (congestive) heart failure: Secondary | ICD-10-CM | POA: Diagnosis not present

## 2021-12-05 LAB — BASIC METABOLIC PANEL
Anion gap: 6 (ref 5–15)
BUN: 27 mg/dL — ABNORMAL HIGH (ref 8–23)
CO2: 26 mmol/L (ref 22–32)
Calcium: 8.6 mg/dL — ABNORMAL LOW (ref 8.9–10.3)
Chloride: 107 mmol/L (ref 98–111)
Creatinine, Ser: 0.88 mg/dL (ref 0.44–1.00)
GFR, Estimated: >60 mL/min (ref >60)
Glucose, Bld: 119 mg/dL — ABNORMAL HIGH (ref 70–99)
Potassium: 4.4 mmol/L (ref 3.5–5.1)
Sodium: 139 mmol/L (ref 135–145)

## 2021-12-05 LAB — MAGNESIUM: Magnesium: 2.1 mg/dL (ref 1.7–2.4)

## 2021-12-05 MED ORDER — ENTRESTO 24-26 MG PO TABS: 1.0000 | ORAL_TABLET | Freq: Every day | ORAL | 0 refills | Status: DC

## 2021-12-05 MED ORDER — ATORVASTATIN CALCIUM 40 MG PO TABS: 40.0000 mg | ORAL_TABLET | Freq: Every morning | ORAL | 0 refills | Status: AC

## 2021-12-05 MED ORDER — ASPIRIN 81 MG PO TBEC: 81.0000 mg | DELAYED_RELEASE_TABLET | Freq: Every day | ORAL | 0 refills | Status: DC

## 2021-12-05 MED ORDER — FUROSEMIDE 20 MG PO TABS: 20.0000 mg | ORAL_TABLET | Freq: Every day | ORAL | 0 refills | Status: AC

## 2021-12-05 NOTE — Progress Notes (Signed)
Discharge instructions explained to pt/ verbalized an understanding/ iv and tele removed/ will transport off unit via wheelchair.  

## 2021-12-05 NOTE — TOC Transition Note (Signed)
Transition of Care (TOC) - CM/SW Discharge Note ? ? ?Patient Details  ?Name: Tina Fields ?MRN: [id number] ?Date of Birth: [birth year] ? ?Transition of Care (TOC) CM/SW Contact:  ?Alberteen Sam, LCSW ?Phone Number: ?12/05/2021, 11:50 AM ? ? ?Clinical Narrative:    ? ?Patient to discharge home today with [facility] home health, kelsey with [facility] informed of patients discharge.  ? ?No further needs identified.  ? ? ? ?Final next level of care: [place] ?Barriers to Discharge: No Barriers Identified ? ? ?Patient Goals and CMS Choice ?Patient states their goals for this hospitalization and ongoing recovery are:: to go home ?CMS Medicare.gov Compare Post Acute Care list provided to:: Patient ?Choice offered to / list presented to : Patient ? ?Discharge Placement ?  ?           ?  ?  ?  ?  ? ?Discharge Plan and Services ?  ?Discharge Planning Services: CM Consult ?           ?DME Arranged: N/A ?DME Agency: NA ?  ?  ?  ?HH Arranged: PT, OT, RN, Nurse's Aide ?[place] Agency: [facility] ?Date HH Agency Contacted: 12/05/21 ?Time [place]: 1150 ?Representative spoke with at [place]: Merleen Nicely ? ?Social Determinants of Health (SDOH) Interventions ?  ? ? ?Readmission Risk Interventions ? ?  12/04/2021  ?  2:04 PM  ?Readmission Risk Prevention Plan  ?Transportation Screening Complete  ?PCP or Specialist Appt within 5-7 Days Complete  ?Home Care Screening Complete  ?Medication Review (RN CM) Referral to Pharmacy  ? ? ? ? ? ?

## 2021-12-05 NOTE — Care Management Important Message (Signed)
Important Message ? ?Patient Details  ?Name: Tina Fields ?MRN: [id number] ?Date of Birth: 08/06/1946 ? ? ?Medicare Important Message Given:  Yes ? ? ? ? ?Dannette Barbara ?12/05/2021, 11:51 AM ?

## 2021-12-05 NOTE — Discharge Summary (Signed)
?Discharge Summary ? ?Tina Fields [id number] DOB: 12/08/1945 ? ?PCP: Oswaldo Conroy, MD ? ?Admit date: 12/02/2021 ?Discharge date: 12/05/2021 ? ?Time spent: 35 minutes. ? ?Recommendations for Outpatient Follow-up:  ?Follow-up with your cardiologist Dr. Darrold Junker in 1 to 2 weeks. ?Follow-up with your primary care provider in 1 to 2 weeks. ?Take your medications as prescribed. ?Continue PT OT with assistance and fall precautions. ? ?Discharge Diagnoses:  ?Active Hospital Problems  ? Diagnosis Date Noted  ? Acute diastolic CHF (congestive heart failure) (HCC) 12/02/2021  ?  Priority: 1.  ? Coronary artery disease involving native coronary artery of native heart without angina pectoris 03/15/2021  ? OSA (obstructive sleep apnea) 10/28/2020  ? HTN (hypertension) 08/21/2017  ? GERD (gastroesophageal reflux disease) 08/21/2017  ? Rheumatoid arthritis, unspecified (HCC) 06/09/2011  ?  ?Resolved Hospital Problems  ?No resolved problems to display.  ? ? ?Discharge Condition: Stable. ? ?Diet recommendation: Resume previous diet.  Heart healthy diet. ? ?Vitals:  ? 12/05/21 0826 12/05/21 1140  ?BP: 124/89 108/73  ?Pulse: 99 76  ?Resp: 18 17  ?Temp: 98.2 ?F (36.8 ?C)   ?SpO2: 99% 99%  ? ? ?History of present illness:  ?Tina Fields is a 76 y.o. female with medical history significant for rheumatoid arthritis, GERD, hypertension, chronic diastolic CHF, coronary artery disease, obstructive sleep apnea noncompliant with CPAP, who presented to [facility] ED for evaluation of 1 week history of progressively worsening weakness, fatigue and dyspnea.  Associated with bilateral lower extremity edema. ?  ?Work-up revealed acute on chronic diastolic CHF.  She received IV diuresing and was seen by cardiology.  ?  ?12/05/2021: Seen at her bedside.  There were no acute events overnight.  She has no new complaints.  No anginal symptoms.  No chest pain, no dyspnea or palpitations.  She is eager to go home.   ? ?Hospital Course:   ?Principal Problem: ?  Acute diastolic CHF (congestive heart failure) (HCC) ?Active Problems: ?  HTN (hypertension) ?  GERD (gastroesophageal reflux disease) ?  Rheumatoid arthritis, unspecified (HCC) ?  Coronary artery disease involving native coronary artery of native heart without angina pectoris ?  OSA (obstructive sleep apnea) ? ?Acute on chronic diastolic CHF (congestive heart failure) (HCC) ?Patient presents for evaluation of fatigue and worsening shortness of breath. ?BNP elevated greater than 800 on admission.  Chest x-ray with findings suggestive of pulmonary edema.  Bilateral lower extremity pitting edema. ?Received IV diuretics, IV Lasix 40 mg daily. ?Continue Imdur 30 mg daily, Entresto 24-26, 1 tablet daily as recommended by cardiology. ?Follow-up with your cardiologist in 1 to 2 weeks. ?  ?OSA (obstructive sleep apnea) ?Patient declined CPAP at night, states she has not worn CPAP in years. ?  ?Coronary artery disease involving native coronary artery of native heart without angina pectoris ?Stable, she denies any anginal symptoms. ?Continue Imdur, aspirin and statins ?  ?Rheumatoid arthritis, unspecified (HCC) ?Continue home methotrexate ?  ?GERD (gastroesophageal reflux disease) ?Continue home PPI ?  ?HTN (hypertension) ?Blood pressure is at goal less than 130/80, at 115/76. ?Continue regimen as recommended by cardiology. ?  ?Hypokalemia ?Serum potassium 3.2, goal greater than 4.0. ?Repleted orally with 40 mEq twice daily x2 doses. ?Magnesium 1.7, repleted, goal greater than 2.0. ?  ?Physical debility/ambulatory dysfunction ?PT assessed and recommended PT OT ?Continue PT OT with assistance and fall precautions. ?  ?  ?  ?Code Status: DNR  ?  ?Procedures: ?None. ? ?Consultations: ?Cardiology. ? ?Discharge Exam: ?BP 108/73 (BP Location:  Left Arm)   Pulse 76   Temp 98.2 ?F (36.8 ?C) (Oral)   Resp 17   Ht  (1.676 m)   Wt 75.6 kg   SpO2 99%   BMI 26.90 kg/m?  ?General: 76 y.o. year-old female  well developed well nourished in no acute distress.  Alert and oriented x3. ?Cardiovascular: Regular rate and rhythm with no rubs or gallops.  No thyromegaly or JVD noted.   ?Respiratory: Clear to auscultation with no wheezes or rales. Good inspiratory effort. ?Abdomen: Soft nontender nondistended with normal bowel sounds x4 quadrants. ?Musculoskeletal: Trace lower extremity edema bilaterally. ?Skin: No ulcerative lesions noted or rashes, ?Psychiatry: Mood is appropriate for condition and setting ? ?Discharge Instructions ?You were cared for by a hospitalist during your hospital stay. If you have any questions about your discharge medications or the care you received while you were in the hospital after you are discharged, you can call the unit and asked to speak with the hospitalist on call if the hospitalist that took care of you is not available. Once you are discharged, your primary care physician will handle any further medical issues. Please note that NO REFILLS for any discharge medications will be authorized once you are discharged, as it is imperative that you return to your primary care physician (or establish a relationship with a primary care physician if you do not have one) for your aftercare needs so that they can reassess your need for medications and monitor your lab values. ? ? ?Allergies as of 12/05/2021   ? ?   Reactions  ? Ace Inhibitors Cough  ? Other reaction(s): Cough  ? Hydrochlorothiazide Other (See Comments)  ? Cramps  ? Latex Hives  ? Azithromycin Rash  ? ?  ? ?  ?Medication List  ?  ? ?STOP taking these medications   ? ?budesonide-formoterol 80-4.5 MCG/ACT inhaler ?Commonly known as: SYMBICORT ?  ?cyclobenzaprine 5 MG tablet ?Commonly known as: FLEXERIL ?  ?HYDROcodone-acetaminophen 5-325 MG tablet ?Commonly known as: NORCO/VICODIN ?  ?metoprolol succinate 25 MG 24 hr tablet ?Commonly known as: TOPROL-XL ?  ?Potassium Chloride ER 20 MEQ Tbcr ?  ?spironolactone 25 MG tablet ?Commonly known  as: ALDACTONE ?  ?Vitamin D (Ergocalciferol) 1.25 MG (50000 UNIT) Caps capsule ?Commonly known as: DRISDOL ?  ? ?  ? ?TAKE these medications   ? ?acetaminophen 650 MG CR tablet ?Commonly known as: TYLENOL ?Take 1,300 mg by mouth as needed for pain. ?  ?albuterol (2.5 MG/3ML) 0.083% nebulizer solution ?Commonly known as: PROVENTIL ?Take 2.5 mg by nebulization every 6 (six) hours as needed for wheezing or shortness of breath. ?  ?albuterol 108 (90 Base) MCG/ACT inhaler ?Commonly known as: VENTOLIN HFA ?Inhale 1-2 puffs into the lungs every 6 (six) hours as needed for wheezing or shortness of breath. ?  ?aspirin 81 MG EC tablet ?Take 1 tablet (81 mg total) by mouth daily. Swallow whole. ?  ?atorvastatin 40 MG tablet ?Commonly known as: LIPITOR ?Take 1 tablet (40 mg total) by mouth every morning. ?What changed: Another medication with the same name was removed. Continue taking this medication, and follow the directions you see here. ?  ?[facility] VITAMIN B12 1000 MCG tablet ?Generic drug: cyanocobalamin ?Take 1,000 mcg by mouth daily. ?  ?Entresto 24-26 MG ?Generic drug: sacubitril-valsartan ?Take 1 tablet by mouth daily. ?Start taking on: December 06, 2021 ?  ?fluticasone 50 MCG/ACT nasal spray ?Commonly known as: FLONASE ?Place 2 sprays into the nose daily as needed for  allergies. ?  ?folic acid 1 MG tablet ?Commonly known as: FOLVITE ?Take 1 mg by mouth daily. ?  ?furosemide 20 MG tablet ?Commonly known as: LASIX ?Take 1 tablet (20 mg total) by mouth daily. ?What changed:  ?medication strength ?how much to take ?  ?Humira Pen 40 MG/0.4ML Pnkt ?Generic drug: Adalimumab ?Inject 40 mg into the skin every 14 (fourteen) days. ?  ?ibuprofen 200 MG tablet ?Commonly known as: ADVIL ?Take 200 mg by mouth as needed. ?  ?isosorbide mononitrate 30 MG 24 hr tablet ?Commonly known as: IMDUR ?Take 30 mg by mouth daily. ?  ?methotrexate 2.5 MG tablet ?Commonly known as: RHEUMATREX ?Take 7 tablets by mouth every Tuesday. ?  ?MiraLax 17  GM/SCOOP powder ?Generic drug: polyethylene glycol powder ?Take 17 g by mouth daily as needed for moderate constipation. Dissolve 17 gm in 8 ounces of water and drink ?  ?montelukast 10 MG tablet ?Commonly known as:

## 2021-12-05 NOTE — Care Management CC44 (Signed)
Condition Code 44 Documentation Completed ? ?Patient Details  ?Name: Alyssa GroveJackie Vincent  ?MRN: [id number] ?Date of Birth: 10/09/1945 ? ? ?Condition Code 44 given:  Yes ?Patient signature on Condition Code 44 notice:  Yes ?Documentation of 2 MD's agreement:  Yes ?Code 44 added to claim:  Yes ? ? ? ?Gildardo GriffesAshley M Mackenzie Groom, LCSW ?12/05/2021, 12:21 PM ? ?

## 2021-12-05 NOTE — Progress Notes (Addendum)
[facility] ? ?CARDIOLOGY CONSULT NOTE  ?Patient ID: ?Tina Fields ?MRN: [id number] ?DOB/AGE: 78-Mar-1947 78 y.o. ? ?Admit date: 12/02/2021 ?Referring Physician Margo Aye ?Primary Physician B[place] ?Primary Cardiologist  ?Reason for Consultation acute diastolic congestive heart failure ? ?HPI: 76 year old female referred for evaluation of acute on chronic diastolic congestive heart failure.  The patient has a history of chronic diastolic congestive heart failure, insignificant coronary artery disease by prior cardiac catheterization 06/11/2005 and 05/20/2008, essential hypertension and obstructive sleep apnea.  2D echocardiogram 03/03/2021 revealed LV EF 60-65%.  The patient had a very similar presentation 09/27/2021 to [facility] ED which resolved after 1 dose of IV furosemide and was not admitted.  She presents to [facility] ED/09/2021 with 1 week history of worsening exertional dyspnea with lower extremity edema.  Admission labs notable for normal high sensitive troponin (10, 10), and mildly elevated BNP 826.8.  ECG revealed sinus rhythm with nonspecific T wave abnormality laterally.  Chest x-ray revealed mild pulmonary edema.  Patient was treated with intravenous Lasix with diuresis and overall clinical improvement. ? ?Interval History: ?-feels well today, states prior to admission she cut back on her lasix from 40mg  to 20mg  on her own and wasn't watching her salt.  ?-Denies chest pain, worsening sob, palpitations. Has ambulated in the room without difficulty.  ? ?Review of systems complete and found to be negative unless listed above  ? ? ? ?Past Medical History:  ?Diagnosis Date  ? Aortic atherosclerosis (HCC)   ? Asthma   ? Coronary artery disease   ? Dyspnea   ? Edema of both legs   ? GERD (gastroesophageal reflux disease)   ? HFrEF (heart failure with reduced ejection fraction) (HCC)   ? History of 2019 novel coronavirus disease (COVID-19) 10/28/2020  ? Hypertension   ? Myocardial infarction [facility])   ? OSA (obstructive sleep  apnea)   ? noncompliant with nocturnal PAP therapy  ? RA (rheumatoid arthritis) (HCC)   ?  ?Past Surgical History:  ?Procedure Laterality Date  ? BREAST SURGERY    ? CARDIAC CATHETERIZATION Left 06/21/2005  ? Moderate ostial LCx and LAD disease; LVEF 45%; Location: [facility]; Surgeon: Adrian Blackwater, MD  ? CARDIAC CATHETERIZATION Left 05/20/2008  ? No occlusive CAD; LVEF 65%; Location: [facility]; Surgeon: Rudean Hitt, MD  ? COLONOSCOPY    ? ESOPHAGOGASTRODUODENOSCOPY    ? TUBAL LIGATION    ?  ?Medications Prior to Admission  ?Medication Sig Dispense Refill Last Dose  ? acetaminophen (TYLENOL) 650 MG CR tablet Take 1,300 mg by mouth as needed for pain.   prn at prn  ? albuterol (PROVENTIL) (2.5 MG/3ML) 0.083% nebulizer solution Take 2.5 mg by nebulization every 6 (six) hours as needed for wheezing or shortness of breath.   prn at prn  ? albuterol (VENTOLIN HFA) 108 (90 Base) MCG/ACT inhaler Inhale 1-2 puffs into the lungs every 6 (six) hours as needed for wheezing or shortness of breath.   prn at prn  ? atorvastatin (LIPITOR) 40 MG tablet Take 40 mg by mouth every morning.   12/01/2021 at 0900  ? CVS VITAMIN B12 1000 MCG tablet Take 1,000 mcg by mouth daily.   12/01/2021 at 0900  ? ENTRESTO 24-26 MG Take 1 tablet by mouth daily.   12/01/2021 at 0900  ? fluticasone (FLONASE) 50 MCG/ACT nasal spray Place 2 sprays into the nose daily as needed for allergies.   prn at prn  ? folic acid (FOLVITE) 1 MG tablet Take 1 mg by mouth daily.  12/01/2021 at 0900  ? furosemide (LASIX) 40 MG tablet Take 40 mg by mouth daily.   12/01/2021 at 0900  ? ibuprofen (ADVIL) 200 MG tablet Take 200 mg by mouth as needed.   prn at prn  ? isosorbide mononitrate (IMDUR) 30 MG 24 hr tablet Take 30 mg by mouth daily.   12/01/2021 at 0900  ? methotrexate (RHEUMATREX) 2.5 MG tablet Take 7 tablets by mouth every Tuesday.   11/29/2021 at unk  ? MIRALAX 17 GM/SCOOP powder Take 17 g by mouth daily as needed for moderate constipation. Dissolve 17 gm in 8 ounces of  water and drink   prn at prn  ? montelukast (SINGULAIR) 10 MG tablet Take 10 mg by mouth at bedtime.   12/01/2021 at 0900  ? omeprazole (PRILOSEC) 20 MG capsule Take 20 mg by mouth 2 (two) times daily before a meal.   prn at prn  ? aspirin EC 81 MG EC tablet Take 1 tablet (81 mg total) by mouth daily. Swallow whole. (Patient not taking: Reported on 12/02/2021) 30 tablet 11 Not Taking  ? atorvastatin (LIPITOR) 80 MG tablet Take 80 mg by mouth daily. (Patient not taking: Reported on 12/02/2021)   Not Taking  ? budesonide-formoterol (SYMBICORT) 80-4.5 MCG/ACT inhaler Inhale 2 puffs into the lungs 2 (two) times daily as needed (asthma). (Patient not taking: Reported on 12/02/2021)   Not Taking  ? cyclobenzaprine (FLEXERIL) 5 MG tablet Take 5 mg by mouth See admin instructions. Take 5 mg by mouth once daily at bedtime as needed for muscle pain (Patient not taking: Reported on 12/02/2021)   Not Taking  ? HUMIRA PEN 40 MG/0.4ML PNKT Inject 40 mg into the skin every 14 (fourteen) days.     ? HYDROcodone-acetaminophen (NORCO/VICODIN) 5-325 MG tablet Take 1 tablet by mouth every 4 (four) hours as needed for moderate pain. (Patient not taking: Reported on 12/02/2021) 20 tablet 0 Completed Course  ? metoprolol succinate (TOPROL-XL) 25 MG 24 hr tablet Take 25 mg by mouth daily. (Patient not taking: Reported on 12/02/2021)   Not Taking  ? potassium chloride 20 MEQ TBCR Take 10 mEq by mouth 2 (two) times daily for 5 days. (Patient not taking: Reported on 12/02/2021) 10 tablet 0 Completed Course  ? spironolactone (ALDACTONE) 25 MG tablet Take 12.5 mg by mouth daily. (Patient not taking: Reported on 12/02/2021)   Not Taking  ? Vitamin D, Ergocalciferol, (DRISDOL) 1.25 MG (50000 UNIT) CAPS capsule Take 50,000 Units by mouth every Sunday. (Patient not taking: Reported on 12/02/2021)   Not Taking  ? ?Social History  ? ?Socioeconomic History  ? Marital status: Widowed  ?  Spouse name: Not on file  ? Number of children: Not on file  ? Years of  education: Not on file  ? Highest education level: Not on file  ?Occupational History  ? Not on file  ?Tobacco Use  ? Smoking status: Former  ?  Types: Cigarettes  ?  Quit date: 01/09/1995  ?  Years since quitting: 26.9  ? Smokeless tobacco: Never  ?Vaping Use  ? Vaping Use: Never used  ?Substance and Sexual Activity  ? Alcohol use: No  ? Drug use: No  ? Sexual activity: Yes  ?  Birth control/protection: None  ?Other Topics Concern  ? Not on file  ?Social History Narrative  ? Adult son  ? ?Social Determinants of Health  ? ?Financial Resource Strain: Not on file  ?Food Insecurity: Not on file  ?Transportation Needs: Not on  file  ?Physical Activity: Not on file  ?Stress: Not on file  ?Social Connections: Not on file  ?Intimate Partner Violence: Not on file  ?  ?Family History  ?Problem Relation Age of Onset  ? CAD Mother   ? Diabetes Mother   ? Hypertension Mother   ? CAD Father   ? Diabetes Father   ? Hypertension Father   ? Stroke Father   ?  ? ? ?Review of systems complete and found to be negative unless listed above  ? ? ? ? ?PHYSICAL EXAM ? ?General: Pleasant elderly black female. Well developed, well nourished, in no acute distress. Sitting upright in PCU bed.  ?HEENT:  Normocephalic and atraumatic ?Neck:  No JVD.  ?Lungs: Clear bilaterally to auscultation. Normal respiratory effort on room air.  ?Heart: HRRR . Normal S1 and S2 without gallops or murmurs.  ?Abdomen: nondistended appearing ?Msk:  Normal strength and tone for age. ?Extremities: No clubbing, cyanosis. Trace bilateral pedal edema.   ?Neuro: Alert and oriented X 3. ?Psych:  Good affect, responds appropriately ? ?Labs: ?  ?Lab Results  ?Component Value Date  ? WBC 4.4 12/04/2021  ? HGB 13.1 12/04/2021  ? HCT 40.2 12/04/2021  ? MCV 96.4 12/04/2021  ? PLT 262 12/04/2021  ?  ?Recent Labs  ?Lab 12/05/21 ?[id number]  ?NA 139  ?K 4.4  ?CL 107  ?CO2 26  ?BUN 27*  ?CREATININE 0.88  ?CALCIUM 8.6*  ?GLUCOSE 119*  ? ? ?Lab Results  ?Component Value Date  ? CKTOTAL 150  03/15/2013  ? CKMB 1.3 03/15/2013  ? TROPONINI <0.03 09/12/2018  ? ?  ?Lab Results  ?Component Value Date  ? CHOL 154 03/03/2021  ? CHOL 174 08/21/2017  ? ?Lab Results  ?Component Value Date  ? HDL 27 (L) 0

## 2021-12-05 NOTE — Care Management Obs Status (Signed)
MEDICARE OBSERVATION STATUS NOTIFICATION ? ? ?Patient Details  ?Name: Tina Fields ?MRN: [id number] ?Date of Birth: [birth year] ? ? ?Medicare Observation Status Notification Given:  Yes ? ? ? ?Alberteen Sam, LCSW ?12/05/2021, 12:21 PM ?

## 2021-12-06 ENCOUNTER — Ambulatory Visit: Payer: Medicare HMO | Admitting: Family

## 2021-12-15 ENCOUNTER — Ambulatory Visit: Payer: Medicare Other | Attending: Family | Admitting: Family

## 2021-12-15 ENCOUNTER — Encounter: Payer: Self-pay | Admitting: Family

## 2021-12-15 VITALS — BP 130/58 | HR 74 | Resp 18 | Ht 66.0 in | Wt 171.2 lb

## 2021-12-15 DIAGNOSIS — Z79899 Other long term (current) drug therapy: Secondary | ICD-10-CM | POA: Insufficient documentation

## 2021-12-15 DIAGNOSIS — Z87891 Personal history of nicotine dependence: Secondary | ICD-10-CM | POA: Insufficient documentation

## 2021-12-15 DIAGNOSIS — J45909 Unspecified asthma, uncomplicated: Secondary | ICD-10-CM | POA: Diagnosis not present

## 2021-12-15 DIAGNOSIS — M069 Rheumatoid arthritis, unspecified: Secondary | ICD-10-CM | POA: Diagnosis not present

## 2021-12-15 DIAGNOSIS — K219 Gastro-esophageal reflux disease without esophagitis: Secondary | ICD-10-CM | POA: Insufficient documentation

## 2021-12-15 DIAGNOSIS — G4733 Obstructive sleep apnea (adult) (pediatric): Secondary | ICD-10-CM | POA: Diagnosis not present

## 2021-12-15 DIAGNOSIS — I700 Atherosclerosis of aorta: Secondary | ICD-10-CM | POA: Diagnosis not present

## 2021-12-15 DIAGNOSIS — I10 Essential (primary) hypertension: Secondary | ICD-10-CM

## 2021-12-15 DIAGNOSIS — I5020 Unspecified systolic (congestive) heart failure: Secondary | ICD-10-CM | POA: Diagnosis not present

## 2021-12-15 DIAGNOSIS — I5022 Chronic systolic (congestive) heart failure: Secondary | ICD-10-CM | POA: Diagnosis not present

## 2021-12-15 DIAGNOSIS — I110 Hypertensive heart disease with heart failure: Secondary | ICD-10-CM | POA: Insufficient documentation

## 2021-12-15 DIAGNOSIS — I2510 Atherosclerotic heart disease of native coronary artery without angina pectoris: Secondary | ICD-10-CM | POA: Insufficient documentation

## 2021-12-15 NOTE — Patient Instructions (Addendum)
Continue weighing daily and call for an overnight weight gain of 3 pounds or more or a weekly weight gain of more than 5 pounds. ? ? ?If you have voicemail, please make sure your mailbox is cleaned out so that we may leave a message and please make sure to listen to any voicemails.  ? ? ?Take your entresto as 1 tablet in the morning and 1 tablet in the evening. Check your blood pressure daily.  ?

## 2021-12-15 NOTE — Progress Notes (Signed)
? Patient ID: Tina Fields, female    DOB: 05/01/1946, 76 y.o.   MRN: [id number] ? ?HPI ? ?Tina Fields is a 76 y/o female with a history of asthma, CAD, HTN, aortic atherosclerosis, asthma, GERD, obstructive sleep apnea, RA, previous tobacco use and chronic heart failure.  ? ?Echo report from 05/03/21 reviewed and showed an EF of 45-50% with moderate LAE.  ? ?Admitted 12/02/21 due to SOB due to HF. Cardiology consult obtained. Initially given IV lasix with transition to oral diuretics. Discharged after 3 days.  ? ?She presents today for her initial visit with a chief complaint of moderate fatigue with minimal exertion. Describes this as chronic in nature having been present for several years. She has associated shortness of breath, pedal edema (resolves overnight) and pain in both hands (due to RA) along with this. She denies any difficulty sleeping, abdominal distention, palpitations, chest pain, cough, dizziness or weight gain.  ? 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  She has been taking her entresto as 1 tablet daily because at one point she was having hypotension.  ? ?Past Medical History:  ?Diagnosis Date  ? Aortic atherosclerosis (HCC)   ? Asthma   ? CHF (congestive heart failure) (HCC)   ? Coronary artery disease   ? Dyspnea   ? Edema of both legs   ? GERD (gastroesophageal reflux disease)   ? HFrEF (heart failure with reduced ejection fraction) (HCC)   ? History of 2019 novel coronavirus disease (COVID-19) 10/28/2020  ? Hypertension   ? Myocardial infarction [facility](HCC)   ? OSA (obstructive sleep apnea)   ? noncompliant with nocturnal PAP therapy  ? RA (rheumatoid arthritis) (HCC)   ? ?Past Surgical History:  ?Procedure Laterality Date  ? BREAST SURGERY    ? CARDIAC CATHETERIZATION Left 06/21/2005  ? Moderate ostial LCx and LAD disease; LVEF 45%; Location: [facility]; Surgeon: Adrian BlackwaterShaukat Khan, MD  ? CARDIAC CATHETERIZATION Left 05/20/2008  ? No occlusive CAD;  LVEF 65%; Location: [facility]; Surgeon: Rudean Hittwyane Callwood, MD  ? COLONOSCOPY    ? ESOPHAGOGASTRODUODENOSCOPY    ? TUBAL LIGATION    ? ?Family History  ?Problem Relation Age of Onset  ? CAD Mother   ? Diabetes Mother   ? Hypertension Mother   ? CAD Father   ? Diabetes Father   ? Hypertension Father   ? Stroke Father   ? ?Social History  ? ?Tobacco Use  ? Smoking status: Former  ?  Types: Cigarettes  ?  Quit date: 01/09/1995  ?  Years since quitting: 26.9  ? Smokeless tobacco: Never  ?Substance Use Topics  ? Alcohol use: No  ? ?Allergies  ?Allergen Reactions  ? Ace Inhibitors Cough  ?  Other reaction(s): Cough ?  ? Hydrochlorothiazide Other (See Comments)  ?  Cramps  ? Latex Hives  ? Azithromycin Rash  ? ?Prior to Admission medications   ?Medication Sig Start Date End Date Taking? Authorizing Provider  ?acetaminophen (TYLENOL) 650 MG CR tablet Take 1,300 mg by mouth as needed for pain.   Yes [provider]  ?albuterol (VENTOLIN HFA) 108 (90 Base) MCG/ACT inhaler Inhale 1-2 puffs into the lungs every 6 (six) hours as needed for wheezing or shortness of breath. 11/06/20  Yes [provider]  ?atorvastatin (LIPITOR) 40 MG tablet Take 1 tablet (40 mg total) by mouth every morning. 12/05/21 03/05/22 Yes Darlin DropHall, Carole N, DO  ?[facility] VITAMIN B12 1000 MCG  tablet Take 1,000 mcg by mouth daily. 08/04/20  Yes [provider]  ?fluticasone (FLONASE) 50 MCG/ACT nasal spray Place 2 sprays into the nose daily as needed for allergies. 12/03/18  Yes [provider]  ?folic acid (FOLVITE) 1 MG tablet Take 1 mg by mouth daily. 09/19/21  Yes [provider]  ?furosemide (LASIX) 20 MG tablet Take 1 tablet (20 mg total) by mouth daily. 12/05/21 03/05/22 Yes Darlin Drop, DO  ?ibuprofen (ADVIL) 200 MG tablet Take 200 mg by mouth as needed.   Yes [provider]  ?isosorbide mononitrate (IMDUR) 30 MG 24 hr tablet Take 30 mg by mouth daily.   Yes [provider]  ?methotrexate (RHEUMATREX) 2.5 MG tablet  Take 7 tablets by mouth every Tuesday. 10/15/21  Yes [provider]  ?MIRALAX 17 GM/SCOOP powder Take 17 g by mouth daily as needed for moderate constipation. Dissolve 17 gm in 8 ounces of water and drink 01/11/21  Yes [provider]  ?montelukast (SINGULAIR) 10 MG tablet Take 10 mg by mouth at bedtime.   Yes [provider]  ?omeprazole (PRILOSEC) 20 MG capsule Take 20 mg by mouth 2 (two) times daily before a meal.   Yes [provider]  ?sacubitril-valsartan (ENTRESTO) 24-26 MG Take 1 tablet by mouth daily. 12/06/21 03/06/22 Yes Darlin Drop, DO  ?albuterol (PROVENTIL) (2.5 MG/3ML) 0.083% nebulizer solution Take 2.5 mg by nebulization every 6 (six) hours as needed for wheezing or shortness of breath. ?Patient not taking: Reported on 12/15/2021    [provider]  ? ?Review of Systems  ?Constitutional:  Positive for fatigue (tire easily). Negative for appetite change.  ?HENT:  Negative for congestion, postnasal drip and sore throat.   ?Eyes: Negative.   ?Respiratory:  Positive for shortness of breath. Negative for cough, chest tightness and wheezing.   ?Cardiovascular:  Positive for leg swelling (resolves overnight). Negative for chest pain and palpitations.  ?Gastrointestinal:  Negative for abdominal distention and abdominal pain.  ?Endocrine: Negative.   ?Genitourinary: Negative.   ?Musculoskeletal:  Positive for arthralgias (hands due to RA). Negative for back pain.  ?Skin: Negative.   ?Allergic/Immunologic: Negative.   ?Neurological:  Negative for dizziness and light-headedness.  ?Hematological:  Negative for adenopathy. Does not bruise/bleed easily.  ?Psychiatric/Behavioral:  Negative for dysphoric mood and sleep disturbance (sleeping on 1 pillow). The patient is not nervous/anxious.   ? ?Vitals:  ? 12/15/21 1315  ?BP: (!) 130/58  ?Pulse: 74  ?Resp: 18  ?SpO2: 100%  ?Weight: 171 lb 4 oz (77.7 kg)  ?Height:  (1.676 m)  ? ?Wt Readings from Last 3 Encounters:   ?12/15/21 171 lb 4 oz (77.7 kg)  ?12/05/21 166 lb 10.7 oz (75.6 kg)  ?09/27/21 158 lb 15.2 oz (72.1 kg)  ? ?Lab Results  ?Component Value Date  ? CREATININE 0.88 12/05/2021  ? CREATININE 0.92 12/04/2021  ? CREATININE 0.91 12/03/2021  ? ?Physical Exam ?Constitutional:   ?   Appearance: Normal appearance.  ?HENT:  ?   Head: Normocephalic and atraumatic.  ?Cardiovascular:  ?   Rate and Rhythm: Normal rate and regular rhythm.  ?Pulmonary:  ?   Effort: Pulmonary effort is normal. No respiratory distress.  ?   Breath sounds: No wheezing or rales.  ?Abdominal:  ?   General: There is no distension.  ?   Palpations: Abdomen is soft.  ?Musculoskeletal:     ?   General: No tenderness.  ?   Cervical back: Normal range  of motion and neck supple.  ?   Right lower leg: Edema (nonpitting edema around ankle) present.  ?   Left lower leg: Edema (nonpitting edema around ankle) present.  ?Skin: ?   General: Skin is warm and dry.  ?Neurological:  ?   General: No focal deficit present.  ?   Mental Status: She is alert and oriented to person, place, and time.  ?Psychiatric:     ?   Mood and Affect: Mood normal.     ?   Behavior: Behavior normal.     ?   Thought Content: Thought content normal.  ? ? ?Assessment & Plan: ? ?1: Chronic heart failure with mildly reduced ejection fraction- ?- NYHA class III ?- euvolemic today ?- weighing daily; reminded to call for an overnight weight gain of > 2 pounds or a weekly weight gain of > 5 pounds ?- not adding salt and does read food labels for sodium content ?- on GDMT of entresto although is only taking it daily because of past issues with her BP ?- will increase her entresto to 1 tablet BID; she is to check her BP daily and call me with any questions/problems before then ?- reports low BP in the past so may not be able to tolerate other GDMT ?- saw cardiology (Paraschos) 12/13/21 ?- reports edema in lower legs resolves overnight; encouraged to elevate legs during the day when sitting for long  periods of time ?- BNP 12/02/21 was 826.8 ? ?2: HTN- ?- BP mildly elevated (130/58); increasing entresto per above ?- saw PCP (Linthavong) 11/09/20; needs to make f/u appointment with current PCP Hessie Diener) ?- BMP 4

## 2022-01-26 ENCOUNTER — Ambulatory Visit: Payer: Medicare HMO | Admitting: Family

## 2022-01-26 NOTE — Progress Notes (Unsigned)
Patient ID: Tina GroveJackie Vincent , female    DOB: 05/17/1946, 76 y.o.   MRN: [id number]  HPI  [name] is a 76 y/o female with a history of asthma, CAD, HTN, aortic atherosclerosis, asthma, GERD, obstructive sleep apnea, RA, previous tobacco use and chronic heart failure.   Echo report from 05/03/21 reviewed and showed an EF of 45-50% with moderate LAE.   Admitted 12/02/21 due to SOB due to HF. Cardiology consult obtained. Initially given IV lasix with transition to oral diuretics. Discharged after 3 days.   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  She has been taking her entresto as 1 tablet daily because at one point she was having hypotension.   Past Medical History:  Diagnosis Date   Aortic atherosclerosis (HCC)    Asthma    CHF (congestive heart failure) (HCC)    Coronary artery disease    Dyspnea    Edema of both legs    GERD (gastroesophageal reflux disease)    HFrEF (heart failure with reduced ejection fraction) (HCC)    History of 2019 novel coronavirus disease (COVID-19) 10/28/2020   Hypertension    Myocardial infarction (HCC)    OSA (obstructive sleep apnea)    noncompliant with nocturnal PAP therapy   RA (rheumatoid arthritis) (HCC)    Past Surgical History:  Procedure Laterality Date   BREAST SURGERY     CARDIAC CATHETERIZATION Left 06/21/2005   Moderate ostial LCx and LAD disease; LVEF 45%; Location: [facility]; Surgeon: Adrian BlackwaterShaukat Khan, MD   CARDIAC CATHETERIZATION Left 05/20/2008   No occlusive CAD; LVEF 65%; Location: [facility]; Surgeon: Rudean Hittwyane Callwood, MD   COLONOSCOPY     ESOPHAGOGASTRODUODENOSCOPY     TUBAL LIGATION     Family History  Problem Relation Age of Onset   CAD Mother    Diabetes Mother    Hypertension Mother    CAD Father    Diabetes Father    Hypertension Father    Stroke Father    Social History   Tobacco Use   Smoking  status: Former    Types: Cigarettes    Quit date: 01/09/1995    Years since quitting: 27.0   Smokeless tobacco: Never  Substance Use Topics   Alcohol use: No   Allergies  Allergen Reactions   Ace Inhibitors Cough    Other reaction(s): Cough    Hydrochlorothiazide Other (See Comments)    Cramps   Latex Hives   Azithromycin Rash    Review of Systems  Constitutional:  Positive for fatigue (tire easily). Negative for appetite change.  HENT:  Negative for congestion, postnasal drip and sore throat.   Eyes: Negative.   Respiratory:  Positive for shortness of breath. Negative for cough, chest tightness and wheezing.   Cardiovascular:  Positive for leg swelling (resolves overnight). Negative for chest pain and palpitations.  Gastrointestinal:  Negative for abdominal distention and abdominal pain.  Endocrine: Negative.   Genitourinary: Negative.   Musculoskeletal:  Positive for arthralgias (hands due to RA). Negative for back pain.  Skin: Negative.   Allergic/Immunologic: Negative.   Neurological:  Negative for dizziness and light-headedness.  Hematological:  Negative for adenopathy. Does not bruise/bleed easily.  Psychiatric/Behavioral:  Negative for dysphoric mood and sleep disturbance (sleeping on 1 pillow). The patient is not nervous/anxious.      Physical Exam Constitutional:  Appearance: Normal appearance.  HENT:     Head: Normocephalic and atraumatic.  Cardiovascular:     Rate and Rhythm: Normal rate and regular rhythm.  Pulmonary:     Effort: Pulmonary effort is normal. No respiratory distress.     Breath sounds: No wheezing or rales.  Abdominal:     General: There is no distension.     Palpations: Abdomen is soft.  Musculoskeletal:        General: No tenderness.     Cervical back: Normal range of motion and neck supple.     Right lower leg: Edema (nonpitting edema around ankle) present.     Left lower leg: Edema (nonpitting edema around ankle) present.   Skin:    General: Skin is warm and dry.  Neurological:     General: No focal deficit present.     Mental Status: She is alert and oriented to person, place, and time.  Psychiatric:        Mood and Affect: Mood normal.        Behavior: Behavior normal.        Thought Content: Thought content normal.    Assessment & Plan:  1: Chronic heart failure with mildly reduced ejection fraction- - NYHA class III - euvolemic today - weighing daily; reminded to call for an overnight weight gain of > 2 pounds or a weekly weight gain of > 5 pounds - weight 171.4 from last visit here 6 weeks ago - not adding salt and does read food labels for sodium content - on GDMT of entresto  - check BMP today due to entresto being increased to BID at last visit - reports low BP in the past so may not be able to tolerate other GDMT - saw cardiology (Paraschos) 12/13/21 - reports edema in lower legs resolves overnight; encouraged to elevate legs during the day when sitting for long periods of time - BNP 12/02/21 was 826.8  2: HTN- - BP  - saw PCP (Linthavong) 11/09/20; needs to make f/u appointment with current PCP (Bender) - BMP 12/05/21 reviewed and showed sodium 139, potassium 4.4, creatinine 0.88 & GFR >60  3: Obstructive sleep apnea- - does not wear CPAP  4: RA- - being followed by a rheumatologist Allena Katz) - reports being in remission for years prior to this and her hands hurt worse now than before    Medication bottles reviewed.

## 2022-01-28 ENCOUNTER — Other Ambulatory Visit: Payer: Self-pay

## 2022-01-28 ENCOUNTER — Emergency Department: Payer: Medicare Other

## 2022-01-28 ENCOUNTER — Inpatient Hospital Stay
Admission: EM | Admit: 2022-01-28 | Discharge: 2022-02-01 | DRG: 175 | Disposition: A | Discharge status: Home Health | Payer: Medicare Other | Attending: Student | Admitting: Student

## 2022-01-28 DIAGNOSIS — I252 Old myocardial infarction: Secondary | ICD-10-CM | POA: Diagnosis not present

## 2022-01-28 DIAGNOSIS — Z881 Allergy status to other antibiotic agents status: Secondary | ICD-10-CM | POA: Diagnosis not present

## 2022-01-28 DIAGNOSIS — Z87891 Personal history of nicotine dependence: Secondary | ICD-10-CM | POA: Diagnosis not present

## 2022-01-28 DIAGNOSIS — M069 Rheumatoid arthritis, unspecified: Secondary | ICD-10-CM | POA: Diagnosis present

## 2022-01-28 DIAGNOSIS — I5043 Acute on chronic combined systolic (congestive) and diastolic (congestive) heart failure: Secondary | ICD-10-CM | POA: Diagnosis present

## 2022-01-28 DIAGNOSIS — I2510 Atherosclerotic heart disease of native coronary artery without angina pectoris: Secondary | ICD-10-CM | POA: Diagnosis present

## 2022-01-28 DIAGNOSIS — Z8249 Family history of ischemic heart disease and other diseases of the circulatory system: Secondary | ICD-10-CM | POA: Diagnosis not present

## 2022-01-28 DIAGNOSIS — J189 Pneumonia, unspecified organism: Secondary | ICD-10-CM | POA: Diagnosis present

## 2022-01-28 DIAGNOSIS — R0602 Shortness of breath: Secondary | ICD-10-CM | POA: Diagnosis present

## 2022-01-28 DIAGNOSIS — R5381 Other malaise: Secondary | ICD-10-CM | POA: Diagnosis not present

## 2022-01-28 DIAGNOSIS — R8281 Pyuria: Secondary | ICD-10-CM | POA: Diagnosis present

## 2022-01-28 DIAGNOSIS — I2699 Other pulmonary embolism without acute cor pulmonale: Principal | ICD-10-CM

## 2022-01-28 DIAGNOSIS — R8290 Unspecified abnormal findings in urine: Secondary | ICD-10-CM | POA: Diagnosis present

## 2022-01-28 DIAGNOSIS — I2609 Other pulmonary embolism with acute cor pulmonale: Principal | ICD-10-CM | POA: Diagnosis present

## 2022-01-28 DIAGNOSIS — I110 Hypertensive heart disease with heart failure: Secondary | ICD-10-CM | POA: Diagnosis present

## 2022-01-28 DIAGNOSIS — I959 Hypotension, unspecified: Secondary | ICD-10-CM | POA: Diagnosis not present

## 2022-01-28 DIAGNOSIS — G4733 Obstructive sleep apnea (adult) (pediatric): Secondary | ICD-10-CM | POA: Diagnosis present

## 2022-01-28 DIAGNOSIS — Z20822 Contact with and (suspected) exposure to covid-19: Secondary | ICD-10-CM | POA: Diagnosis present

## 2022-01-28 DIAGNOSIS — Z8616 Personal history of COVID-19: Secondary | ICD-10-CM

## 2022-01-28 DIAGNOSIS — I248 Other forms of acute ischemic heart disease: Secondary | ICD-10-CM | POA: Diagnosis present

## 2022-01-28 DIAGNOSIS — N179 Acute kidney failure, unspecified: Secondary | ICD-10-CM | POA: Diagnosis present

## 2022-01-28 DIAGNOSIS — Z91040 Latex allergy status: Secondary | ICD-10-CM | POA: Diagnosis not present

## 2022-01-28 DIAGNOSIS — Z888 Allergy status to other drugs, medicaments and biological substances status: Secondary | ICD-10-CM | POA: Diagnosis not present

## 2022-01-28 DIAGNOSIS — J9601 Acute respiratory failure with hypoxia: Secondary | ICD-10-CM | POA: Diagnosis present

## 2022-01-28 DIAGNOSIS — J440 Chronic obstructive pulmonary disease with acute lower respiratory infection: Secondary | ICD-10-CM | POA: Diagnosis present

## 2022-01-28 DIAGNOSIS — E876 Hypokalemia: Secondary | ICD-10-CM | POA: Diagnosis present

## 2022-01-28 DIAGNOSIS — K219 Gastro-esophageal reflux disease without esophagitis: Secondary | ICD-10-CM | POA: Diagnosis present

## 2022-01-28 DIAGNOSIS — J449 Chronic obstructive pulmonary disease, unspecified: Secondary | ICD-10-CM | POA: Diagnosis not present

## 2022-01-28 DIAGNOSIS — I10 Essential (primary) hypertension: Secondary | ICD-10-CM | POA: Diagnosis present

## 2022-01-28 DIAGNOSIS — R778 Other specified abnormalities of plasma proteins: Secondary | ICD-10-CM | POA: Diagnosis not present

## 2022-01-28 DIAGNOSIS — Z79899 Other long term (current) drug therapy: Secondary | ICD-10-CM

## 2022-01-28 DIAGNOSIS — Z823 Family history of stroke: Secondary | ICD-10-CM

## 2022-01-28 DIAGNOSIS — Z8673 Personal history of transient ischemic attack (TIA), and cerebral infarction without residual deficits: Secondary | ICD-10-CM

## 2022-01-28 DIAGNOSIS — Z86711 Personal history of pulmonary embolism: Secondary | ICD-10-CM | POA: Diagnosis present

## 2022-01-28 DIAGNOSIS — I5020 Unspecified systolic (congestive) heart failure: Secondary | ICD-10-CM | POA: Diagnosis present

## 2022-01-28 DIAGNOSIS — Z902 Acquired absence of lung [part of]: Secondary | ICD-10-CM

## 2022-01-28 LAB — URINALYSIS, COMPLETE (UACMP) WITH MICROSCOPIC
Bacteria, UA: NONE SEEN
Bilirubin Urine: NEGATIVE
Glucose, UA: NEGATIVE mg/dL
Ketones, ur: NEGATIVE mg/dL
Nitrite: NEGATIVE
Protein, ur: NEGATIVE mg/dL
Specific Gravity, Urine: >1.046 — ABNORMAL HIGH (ref 1.005–1.030)
pH: 5 (ref 5.0–8.0)

## 2022-01-28 LAB — PROTIME-INR
INR: 1.2 (ref 0.8–1.2)
Prothrombin Time: 15.5 seconds — ABNORMAL HIGH (ref 11.4–15.2)

## 2022-01-28 LAB — COMPREHENSIVE METABOLIC PANEL
ALT: 14 U/L (ref 0–44)
AST: 21 U/L (ref 15–41)
Albumin: 2.7 g/dL — ABNORMAL LOW (ref 3.5–5.0)
Alkaline Phosphatase: 94 U/L (ref 38–126)
Anion gap: 10 (ref 5–15)
BUN: 17 mg/dL (ref 8–23)
CO2: 27 mmol/L (ref 22–32)
Calcium: 7.5 mg/dL — ABNORMAL LOW (ref 8.9–10.3)
Chloride: 104 mmol/L (ref 98–111)
Creatinine, Ser: 1.02 mg/dL — ABNORMAL HIGH (ref 0.44–1.00)
GFR, Estimated: 56 mL/min — ABNORMAL LOW (ref >60)
Glucose, Bld: 131 mg/dL — ABNORMAL HIGH (ref 70–99)
Potassium: 2.5 mmol/L — CL (ref 3.5–5.1)
Sodium: 141 mmol/L (ref 135–145)
Total Bilirubin: 2.9 mg/dL — ABNORMAL HIGH (ref 0.3–1.2)
Total Protein: 6.6 g/dL (ref 6.5–8.1)

## 2022-01-28 LAB — LACTIC ACID, PLASMA
Lactic Acid, Venous: 1.9 mmol/L (ref 0.5–1.9)
Lactic Acid, Venous: 1.4 mmol/L (ref 0.5–1.9)

## 2022-01-28 LAB — CBC WITH DIFFERENTIAL/PLATELET
Abs Immature Granulocytes: 0.03 10*3/uL (ref 0.00–0.07)
Basophils Absolute: 0 10*3/uL (ref 0.0–0.1)
Basophils Relative: 0 %
Eosinophils Absolute: 0 10*3/uL (ref 0.0–0.5)
Eosinophils Relative: 0 %
HCT: 37.2 % (ref 36.0–46.0)
Hemoglobin: 12.1 g/dL (ref 12.0–15.0)
Immature Granulocytes: 0 %
Lymphocytes Relative: 9 %
Lymphs Abs: 0.6 10*3/uL — ABNORMAL LOW (ref 0.7–4.0)
MCH: 31 pg (ref 26.0–34.0)
MCHC: 32.5 g/dL (ref 30.0–36.0)
MCV: 95.4 fL (ref 80.0–100.0)
Monocytes Absolute: 0.9 10*3/uL (ref 0.1–1.0)
Monocytes Relative: 12 %
Neutro Abs: 5.7 10*3/uL (ref 1.7–7.7)
Neutrophils Relative %: 79 %
Platelets: 183 10*3/uL (ref 150–400)
RBC: 3.9 MIL/uL (ref 3.87–5.11)
RDW: 14.6 % (ref 11.5–15.5)
WBC: 7.2 10*3/uL (ref 4.0–10.5)
nRBC: 0 % (ref 0.0–0.2)

## 2022-01-28 LAB — RESP PANEL BY RT-PCR (FLU A&B, COVID) ARPGX2
Influenza A by PCR: NEGATIVE
Influenza B by PCR: NEGATIVE
SARS Coronavirus 2 by RT PCR: NEGATIVE

## 2022-01-28 LAB — TROPONIN I (HIGH SENSITIVITY): Troponin I (High Sensitivity): 20 ng/L — ABNORMAL HIGH (ref <18)

## 2022-01-28 LAB — MAGNESIUM: Magnesium: 1.5 mg/dL — ABNORMAL LOW (ref 1.7–2.4)

## 2022-01-28 LAB — PROCALCITONIN: Procalcitonin: 0.11 ng/mL

## 2022-01-28 LAB — BRAIN NATRIURETIC PEPTIDE: B Natriuretic Peptide: 686.9 pg/mL — ABNORMAL HIGH (ref 0.0–100.0)

## 2022-01-28 LAB — APTT: aPTT: 33 seconds (ref 24–36)

## 2022-01-28 MED ORDER — ALBUTEROL SULFATE (2.5 MG/3ML) 0.083% IN NEBU: 3.0000 mL | INHALATION_SOLUTION | Freq: Four times a day (QID) | RESPIRATORY_TRACT | Status: DC | PRN

## 2022-01-28 MED ORDER — SODIUM CHLORIDE 0.9 % IV SOLN
100.0000 mg | Freq: Once | INTRAVENOUS | Status: DC
  Filled 2022-01-28: qty 100

## 2022-01-28 MED ORDER — SODIUM CHLORIDE 0.9 % IV SOLN
2.0000 g | INTRAVENOUS | Status: DC
  Administered 2022-01-29 – 2022-01-31 (×3): 2 g via INTRAVENOUS
  Filled 2022-01-28 (×2): qty 20
  Filled 2022-01-28: qty 2
  Filled 2022-01-28 (×2): qty 20

## 2022-01-28 MED ORDER — SODIUM CHLORIDE 0.9 % IV SOLN
2.0000 g | Freq: Once | INTRAVENOUS | Status: AC
  Administered 2022-01-28: 2 g via INTRAVENOUS
  Filled 2022-01-28: qty 20

## 2022-01-28 MED ORDER — DOXYCYCLINE HYCLATE 100 MG IV SOLR
100.0000 mg | Freq: Two times a day (BID) | INTRAVENOUS | Status: DC
  Administered 2022-01-28 – 2022-02-01 (×7): 100 mg via INTRAVENOUS
  Filled 2022-01-28 (×9): qty 100

## 2022-01-28 MED ORDER — MAGNESIUM SULFATE 2 GM/50ML IV SOLN
2.0000 g | Freq: Once | INTRAVENOUS | Status: AC
  Administered 2022-01-28: 2 g via INTRAVENOUS
  Filled 2022-01-28: qty 50

## 2022-01-28 MED ORDER — IOHEXOL 350 MG/ML SOLN
50.0000 mL | Freq: Once | INTRAVENOUS | Status: AC | PRN
  Administered 2022-01-28: 50 mL via INTRAVENOUS

## 2022-01-28 MED ORDER — FLUTICASONE PROPIONATE 50 MCG/ACT NA SUSP: 2.0000 | Freq: Every day | NASAL | Status: DC | PRN

## 2022-01-28 MED ORDER — SODIUM CHLORIDE 0.9 % IV SOLN: 2.0000 g | INTRAVENOUS | Status: DC

## 2022-01-28 MED ORDER — ATORVASTATIN CALCIUM 20 MG PO TABS
40.0000 mg | ORAL_TABLET | Freq: Every morning | ORAL | Status: DC
  Administered 2022-01-30 – 2022-02-01 (×3): 40 mg via ORAL
  Filled 2022-01-28 (×3): qty 2

## 2022-01-28 MED ORDER — SODIUM CHLORIDE 0.9 % IV SOLN: INTRAVENOUS | Status: AC

## 2022-01-28 MED ORDER — HEPARIN BOLUS VIA INFUSION
6000.0000 [IU] | Freq: Once | INTRAVENOUS | Status: DC
  Filled 2022-01-28: qty 6000

## 2022-01-28 MED ORDER — POTASSIUM CHLORIDE 10 MEQ/100ML IV SOLN
10.0000 meq | INTRAVENOUS | Status: AC
  Administered 2022-01-28 (×2): 10 meq via INTRAVENOUS
  Filled 2022-01-28 (×2): qty 100

## 2022-01-28 MED ORDER — HEPARIN (PORCINE) 25000 UT/250ML-% IV SOLN: 1250.0000 [IU]/h | INTRAVENOUS | Status: DC

## 2022-01-28 MED ORDER — MONTELUKAST SODIUM 10 MG PO TABS
10.0000 mg | ORAL_TABLET | Freq: Every day | ORAL | Status: DC
Start: 2022-01-28 — End: 2022-02-01
  Administered 2022-01-28 – 2022-01-31 (×3): 10 mg via ORAL
  Filled 2022-01-28 (×3): qty 1

## 2022-01-28 MED ORDER — SACUBITRIL-VALSARTAN 24-26 MG PO TABS
1.0000 | ORAL_TABLET | Freq: Two times a day (BID) | ORAL | Status: DC
  Filled 2022-01-28: qty 1

## 2022-01-28 NOTE — ED Notes (Signed)
Placed call to ICU to attempt to give report and was told that ICU RN will call back.

## 2022-01-28 NOTE — Assessment & Plan Note (Signed)
Stable.  Continue home medications. 

## 2022-01-28 NOTE — ED Notes (Signed)
Pt's son called and requested a return call from pt. RN provided pt with phone to call her son at [phone]

## 2022-01-28 NOTE — ED Provider Notes (Signed)
[facility] Provider Note    Event Date/Time   First MD Initiated Contact with Patient 01/28/22 1754     (approximate)   History   Shortness of Breath   HPI  Tina Fields is a 76 y.o. female who on review of discharge summary from April of this year has a history of congestive heart failure coronary artery disease hypertension.  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  She has had increasing shortness of breath, home health nurse checked on her yesterday and her oxygen level was low, EMS evaluated her and she did not want to go to the hospital at that time.  She has had increasing shortness of breath.  Denies swelling.  Reports a cough mildly productive.  No known fever, but reports chills and fatigue.  No noted wheezing no chest pain.  Denies pain or discomfort anywhere and feels improved on oxygen       Physical Exam   Triage Vital Signs: ED Triage Vitals [01/28/22 1801]  Enc Vitals Group     BP      Pulse      Resp      Temp      Temp src      SpO2 97 %     Weight      Height      Head Circumference      Peak Flow      Pain Score      Pain Loc      Pain Edu?      Excl. in [place]?     Most recent vital signs: Vitals:   01/28/22 1801  SpO2: 97%  97% on 2 L nasal cannula   General: Awake, just slightly tachypneic.  Occasional cough.  Mild increased work of breathing but in no acute distress CV:  Good peripheral perfusion.  Normal heart tone Resp:  Mild tachypnea.  Slightly diminished lung sounds in the bases without wheezing.  Speaks in phrases.  Oxygen saturation 97% on 2 L nasal cannula.  Additionally, on room air oxygen saturation 87%.  Placed on 2 L.  She does have crackles noted in the right lower base Abd:  No distention.  Other:  No lower  extremity edema.  No JVD.  Not appear acutely edematous   ED Results / Procedures / Treatments   Labs (all labs ordered are listed, but only abnormal results are displayed) Labs Reviewed  RESPIRATORY PANEL BY PCR  RESP PANEL BY RT-PCR (FLU A&B, COVID) ARPGX2  CULTURE, BLOOD (ROUTINE X 2)  CULTURE, BLOOD (ROUTINE X 2)  URINE CULTURE  LACTIC ACID, PLASMA  LACTIC ACID, PLASMA  COMPREHENSIVE METABOLIC PANEL  CBC WITH DIFFERENTIAL/PLATELET  PROTIME-INR  APTT  URINALYSIS, COMPLETE (UACMP) WITH MICROSCOPIC  BRAIN NATRIURETIC PEPTIDE  TROPONIN I (HIGH SENSITIVITY)     EKG  Reviewed and interpreted at 1800 Heart rate 100 QRS 80 QTc 480 Normal sinus rhythm, mild nonspecific T wave abnormality and occasional PVCs   RADIOLOGY *** {USE THE WORD "INTERPRETED"!! You MUST document your own interpretation of imaging, as well as the fact that you reviewed the radiologist's report!:1}   PROCEDURES:  Critical Care performed: {CriticalCareYesNo:19197::"Yes, see critical care procedure note(s)","No"}  Procedures   MEDICATIONS ORDERED IN ED: Medications - No data to display  IMPRESSION / MDM / ASSESSMENT AND PLAN / ED COURSE  I reviewed the triage vital signs and the nursing notes.                              Differential diagnosis includes, but is not limited to, pneumonia, viral syndrome, congestive heart failure though not obvious by clinical assessment, cardiac, CHF etc. all considered.  No pleuritic chest pain, no clinical exam findings would be highly suggestive DVT or PE.  She reports multiple family members having similar illness, developing cough shortness of breath and has crackles on the right lower lung.  Concern for possible acute respiratory illness infection is highest on the differential, but certainly further testing.  Sepsis panel sent    Patient's presentation is most consistent with {EM COPA:27473}  {If the patient is on the monitor, remove the brackets and  asterisks on the sentence below and remember to document it as a Procedure as well. Otherwise delete the sentence below:1} {**The patient is on the cardiac monitor to evaluate for evidence of arrhythmia and/or significant heart rate changes.**} {Remember to include, when applicable, any/all of the following data: independent review of imaging independent review of labs (comment specifically on pertinent positives and negatives) review of specific prior hospitalizations, PCP/specialist notes, etc. discuss meds given and prescribed document any discussion with consultants (including hospitalists) any clinical decision tools you used and why (PECARN, NEXUS, etc.) did you consider admitting the patient? document social determinants of health affecting patient's care (homelessness, inability to follow up in a timely fashion, etc) document any pre-existing conditions increasing risk on current visit (e.g. diabetes and HTN increasing danger of high-risk chest pain/ACS) describes what meds you gave (especially parenteral) and why any other interventions?:1}     FINAL CLINICAL IMPRESSION(S) / ED DIAGNOSES   Final diagnoses:  None     Rx / DC Orders   ED Discharge Orders     None        Note:  This document was prepared using Dragon voice recognition software and may include unintentional dictation errors.

## 2022-01-28 NOTE — H&P (Addendum)
History and Physical    Patient: Tina Fields ZOX:[id number] DOB: [birth year] DOA: 01/28/2022 DOS: the patient was seen and examined on 01/28/2022 PCP: Oswaldo Conroy, MD  Patient coming from: Home  Chief Complaint:  Chief Complaint  Patient presents with   Shortness of Breath   HPI: Tina Fields is a 76 y.o. female with medical history significant of asthma, CHF coming with sob. Son lives with her at home. SOB is since past 3 days . No leg swelling or chest pain. Pt was hypoxic in 80% when checked by EMS.    Review of Systems: As mentioned in the history of present illness. All other systems reviewed and are negative. Past Medical History:  Diagnosis Date   Aortic atherosclerosis (HCC)    Asthma    CHF (congestive heart failure) (HCC)    Coronary artery disease    Dyspnea    Edema of both legs    GERD (gastroesophageal reflux disease)    HFrEF (heart failure with reduced ejection fraction) (HCC)    History of 2019 novel coronavirus disease (COVID-19) 10/28/2020   Hypertension    Myocardial infarction (HCC)    OSA (obstructive sleep apnea)    noncompliant with nocturnal PAP therapy   RA (rheumatoid arthritis) (HCC)    Past Surgical History:  Procedure Laterality Date   BREAST SURGERY     CARDIAC CATHETERIZATION Left 06/21/2005   Moderate ostial LCx and LAD disease; LVEF 45%; Location: [facility]; Surgeon: Adrian Blackwater, MD   CARDIAC CATHETERIZATION Left 05/20/2008   No occlusive CAD; LVEF 65%; Location: [facility]; Surgeon: Rudean Hitt, MD   COLONOSCOPY     ESOPHAGOGASTRODUODENOSCOPY     TUBAL LIGATION     Social History:  reports that she quit smoking about 27 years ago. Her smoking use included cigarettes. She has never used smokeless tobacco. She reports that she does not drink alcohol and does not use drugs.  Allergies  Allergen Reactions   Ace Inhibitors Cough    Other reaction(s): Cough    Hydrochlorothiazide Other (See Comments)    Cramps    Latex Hives   Azithromycin Rash    Family History  Problem Relation Age of Onset   CAD Mother    Diabetes Mother    Hypertension Mother    CAD Father    Diabetes Father    Hypertension Father    Stroke Father     Prior to Admission medications   Medication Sig Start Date End Date Taking? Authorizing Provider  acetaminophen (TYLENOL) 650 MG CR tablet Take 1,300 mg by mouth as needed for pain.    [provider]  albuterol (PROVENTIL) (2.5 MG/3ML) 0.083% nebulizer solution Take 2.5 mg by nebulization every 6 (six) hours as needed for wheezing or shortness of breath. Patient not taking: Reported on 12/15/2021    [provider]  albuterol (VENTOLIN HFA) 108 (90 Base) MCG/ACT inhaler Inhale 1-2 puffs into the lungs every 6 (six) hours as needed for wheezing or shortness of breath. 11/06/20   [provider]  atorvastatin (LIPITOR) 40 MG tablet Take 1 tablet (40 mg total) by mouth every morning. 12/05/21 03/05/22  Darlin Drop, DO  [facility] VITAMIN B12 1000 MCG tablet Take 1,000 mcg by mouth daily. 08/04/20   [provider]  fluticasone (FLONASE) 50 MCG/ACT nasal spray Place 2 sprays into the nose daily as needed for allergies. 12/03/18   [provider]  folic acid (FOLVITE) 1 MG tablet Take 1 mg by mouth daily.  09/19/21   [provider]  furosemide (LASIX) 20 MG tablet Take 1 tablet (20 mg total) by mouth daily. 12/05/21 03/05/22  Darlin Drop, DO  ibuprofen (ADVIL) 200 MG tablet Take 200 mg by mouth as needed.    [provider]  isosorbide mononitrate (IMDUR) 30 MG 24 hr tablet Take 30 mg by mouth daily.    [provider]  methotrexate (RHEUMATREX) 2.5 MG tablet Take 7 tablets by mouth every Tuesday. 10/15/21   [provider]  MIRALAX 17 GM/SCOOP powder Take 17 g by mouth daily as needed for moderate constipation. Dissolve 17 gm in 8 ounces of water and drink 01/11/21   [provider]  montelukast (SINGULAIR)  10 MG tablet Take 10 mg by mouth at bedtime.    [provider]  omeprazole (PRILOSEC) 20 MG capsule Take 20 mg by mouth 2 (two) times daily before a meal.    [provider]  sacubitril-valsartan (ENTRESTO) 24-26 MG Take 1 tablet by mouth daily. Patient taking differently: Take 1 tablet by mouth 2 (two) times daily. 12/06/21 03/06/22  Darlin Drop, DO    Physical Exam: Vitals:   01/28/22 1803 01/28/22 1930 01/28/22 2000 01/28/22 2100  BP:  119/69 124/64 (!) 119/56  Pulse:  94 91 93  Resp:  (!) 24 (!) 35 (!) 23  SpO2:  95% 96% 94%  Weight: 75.3 kg     Height:  (1.676 m)     Physical Exam Vitals and nursing note reviewed.  Constitutional:      General: She is not in acute distress.    Appearance: Normal appearance. She is not ill-appearing, toxic-appearing or diaphoretic.  HENT:     Head: Normocephalic and atraumatic.     Right Ear: Hearing and external ear normal.     Left Ear: Hearing and external ear normal.     Nose: Nose normal. No nasal deformity.     Mouth/Throat:     Lips: Pink.     Mouth: Mucous membranes are moist.     Tongue: No lesions.     Pharynx: Oropharynx is clear.  Eyes:     Extraocular Movements: Extraocular movements intact.     Pupils: Pupils are equal, round, and reactive to light.  Cardiovascular:     Rate and Rhythm: Normal rate and regular rhythm.     Pulses: Normal pulses.     Heart sounds: Normal heart sounds.  Pulmonary:     Effort: Pulmonary effort is normal.     Breath sounds: Wheezing and rhonchi present.  Abdominal:     General: Bowel sounds are normal. There is no distension.     Palpations: Abdomen is soft. There is no mass.     Tenderness: There is no abdominal tenderness. There is no guarding.     Hernia: No hernia is present.  Musculoskeletal:     Right lower leg: No edema.     Left lower leg: No edema.  Skin:    General: Skin is warm.  Neurological:     General: No focal deficit present.     Mental Status:  She is alert and oriented to person, place, and time.     Cranial Nerves: Cranial nerves 2-12 are intact.     Motor: Motor function is intact.  Psychiatric:        Attention and Perception: Attention normal.        Mood and Affect: Mood normal.  Speech: Speech normal.        Behavior: Behavior normal. Behavior is cooperative.        Cognition and Memory: Cognition normal.    Data Reviewed: Results for orders placed or performed during the hospital encounter of 01/28/22 (from the past 24 hour(s))  Resp Panel by RT-PCR (Flu A&B, Covid) Nasopharyngeal Swab     Status: None   Collection Time: 01/28/22  6:14 PM   Specimen: Nasopharyngeal Swab; Nasal Swab  Result Value Ref Range   SARS Coronavirus 2 by RT PCR NEGATIVE NEGATIVE   Influenza A by PCR NEGATIVE NEGATIVE   Influenza B by PCR NEGATIVE NEGATIVE  Lactic acid, plasma     Status: None   Collection Time: 01/28/22  6:14 PM  Result Value Ref Range   Lactic Acid, Venous 1.9 0.5 - 1.9 mmol/L  Comprehensive metabolic panel     Status: Abnormal   Collection Time: 01/28/22  6:14 PM  Result Value Ref Range   Sodium 141 135 - 145 mmol/L   Potassium 2.5 (LL) 3.5 - 5.1 mmol/L   Chloride 104 98 - 111 mmol/L   CO2 27 22 - 32 mmol/L   Glucose, Bld 131 (H) 70 - 99 mg/dL   BUN 17 8 - 23 mg/dL   Creatinine, Ser 4.09 (H) 0.44 - 1.00 mg/dL   Calcium 7.5 (L) 8.9 - 10.3 mg/dL   Total Protein 6.6 6.5 - 8.1 g/dL   Albumin 2.7 (L) 3.5 - 5.0 g/dL   AST 21 15 - 41 U/L   ALT 14 0 - 44 U/L   Alkaline Phosphatase 94 38 - 126 U/L   Total Bilirubin 2.9 (H) 0.3 - 1.2 mg/dL   GFR, Estimated 56 (L) >60 mL/min   Anion gap 10 5 - 15  CBC with Differential     Status: Abnormal   Collection Time: 01/28/22  6:14 PM  Result Value Ref Range   WBC 7.2 4.0 - 10.5 K/uL   RBC 3.90 3.87 - 5.11 MIL/uL   Hemoglobin 12.1 12.0 - 15.0 g/dL   HCT 81.1 91.4 - 78.2 %   MCV 95.4 80.0 - 100.0 fL   MCH 31.0 26.0 - 34.0 pg   MCHC 32.5 30.0 - 36.0 g/dL   RDW 95.6  21.3 - 08.6 %   Platelets 183 150 - 400 K/uL   nRBC 0.0 0.0 - 0.2 %   Neutrophils Relative % 79 %   Neutro Abs 5.7 1.7 - 7.7 K/uL   Lymphocytes Relative 9 %   Lymphs Abs 0.6 (L) 0.7 - 4.0 K/uL   Monocytes Relative 12 %   Monocytes Absolute 0.9 0.1 - 1.0 K/uL   Eosinophils Relative 0 %   Eosinophils Absolute 0.0 0.0 - 0.5 K/uL   Basophils Relative 0 %   Basophils Absolute 0.0 0.0 - 0.1 K/uL   Immature Granulocytes 0 %   Abs Immature Granulocytes 0.03 0.00 - 0.07 K/uL  Protime-INR     Status: Abnormal   Collection Time: 01/28/22  6:14 PM  Result Value Ref Range   Prothrombin Time 15.5 (H) 11.4 - 15.2 seconds   INR 1.2 0.8 - 1.2  APTT     Status: None   Collection Time: 01/28/22  6:14 PM  Result Value Ref Range   aPTT 33 24 - 36 seconds  Brain natriuretic peptide     Status: Abnormal   Collection Time: 01/28/22  6:14 PM  Result Value Ref Range   B Natriuretic Peptide 686.9 (  H) 0.0 - 100.0 pg/mL  Troponin I (High Sensitivity)     Status: Abnormal   Collection Time: 01/28/22  6:14 PM  Result Value Ref Range   Troponin I (High Sensitivity) 20 (H) <18 ng/L  Magnesium     Status: Abnormal   Collection Time: 01/28/22  6:14 PM  Result Value Ref Range   Magnesium 1.5 (L) 1.7 - 2.4 mg/dL  Lactic acid, plasma     Status: None   Collection Time: 01/28/22  8:44 PM  Result Value Ref Range   Lactic Acid, Venous 1.4 0.5 - 1.9 mmol/L     Assessment and Plan: * SOB (shortness of breath)  Pt coming with acute hypoxic respiratory failure due to her  PE. Supplemental oxygen and heparin gtt. We will treat underlying cause.    Acute pulmonary embolism (HCC) Patient presenting with shortness of breath and hypoxia found to have acute massive right-sided pulmonary embolism with right heart strain. We will admit patient to stepdown unit. Pulmonology consulted- dr Earlie Server. And d/w him about case and going to stepdown. Am cardiology consult dr. Beatrix Fetters and 2 d echo .  Prevascular consult  on-call Dr. Myra Gianotti patient does not need to have thrombolytic therapy after reviewing her case.   Patient can be admitted and treated with heparin GTT.  Suspect Covid related PE, pt has covid in september 2022.  No trips to travel. No fracture of HRT.    Latest Ref Rng & Units 01/28/2022    6:14 PM 12/04/2021    6:33 AM 12/03/2021    5:27 AM  CBC  WBC 4.0 - 10.5 K/uL 7.2   4.4   3.9    Hemoglobin 12.0 - 15.0 g/dL 16.1   09.6   04.5    Hematocrit 36.0 - 46.0 % 37.2   40.2   41.1    Platelets 150 - 400 K/uL 183   262   246        CAP (community acquired pneumonia) With ct showing GG infiltrate and suspected infection we will treat with CAP regimen.    Hypokalemia Replace and follow levels.   Hypomagnesemia Replace and follow levels.   Coronary artery disease involving native coronary artery of native heart without angina pectoris Cont statin and hold Imdur, entresto, and   lasix.   COPD with asthma (HCC) Stable,. cont home regimen albuterol.    Elevated troponin Mild troponin elevation We will follow. cont heparin gtt.    HTN (hypertension) Blood pressure (!) 119/56, pulse 93, resp. rate (!) 23, height 5\' 6"  (1.676 m), weight 75.3 kg, SpO2 94 %. hold Imdur/  entresto due to low blood pressure. Hold lasix due to low bp. Restart med's once med rec done and if BP allows to prevent hypotension and / or syncope.  Fall precaution.     Advance Care Planning:    Code Status: Full Code   Consults:  Pulmonary:Dr.Brescia. Cardiology; Dr. Beatrix Fetters.  Family Communication:  russell, dianna (Relative)  [phone] (Mobile)  Severity of Illness: The appropriate patient status for this patient is INPATIENT. Inpatient status is judged to be reasonable and necessary in order to provide the required intensity of service to ensure the patient's safety. The patient's presenting symptoms, physical exam findings, and initial radiographic and laboratory data in the context of their  chronic comorbidities is felt to place them at high risk for further clinical deterioration. Furthermore, it is not anticipated that the patient will be medically stable for discharge from the hospital within  2 midnights of admission.   * I certify that at the point of admission it is my clinical judgment that the patient will require inpatient hospital care spanning beyond 2 midnights from the point of admission due to high intensity of service, high risk for further deterioration and high frequency of surveillance required.*  Author: Gertha CalkinEkta V Merranda Bolls, MD 01/28/2022 10:14 PM  For on call review [email or web address].

## 2022-01-28 NOTE — Consult Note (Signed)
ANTICOAGULATION CONSULT NOTE - Initial Consult  Pharmacy Consult for heparin Indication: pulmonary embolus  Allergies  Allergen Reactions   Ace Inhibitors Cough    Other reaction(s): Cough    Hydrochlorothiazide Other (See Comments)    Cramps   Latex Hives   Azithromycin Rash    Patient Measurements: Height: 5\' 6"  (167.6 cm) Weight: 75.3 kg (166 lb) IBW/kg (Calculated) : 59.3 Heparin Dosing Weight: 74.5kg  Vital Signs: BP: 119/56 (05/27 2100) Pulse Rate: 93 (05/27 2100)  Labs: Recent Labs    01/28/22 1814  HGB 12.1  HCT 37.2  PLT 183  APTT 33  LABPROT 15.5*  INR 1.2  CREATININE 1.02*  TROPONINIHS 20*    Estimated Creatinine Clearance: 47.1 mL/min (A) (by C-G formula based on SCr of 1.02 mg/dL (H)).   Medical History: Past Medical History:  Diagnosis Date   Aortic atherosclerosis (HCC)    Asthma    CHF (congestive heart failure) (HCC)    Coronary artery disease    Dyspnea    Edema of both legs    GERD (gastroesophageal reflux disease)    HFrEF (heart failure with reduced ejection fraction) (HCC)    History of 2019 novel coronavirus disease (COVID-19) 10/28/2020   Hypertension    Myocardial infarction (HCC)    OSA (obstructive sleep apnea)    noncompliant with nocturnal PAP therapy   RA (rheumatoid arthritis) (HCC)     Medications:  PTA: N/A Inpatient: Heparin drip (5/27 >>)   Allergies: No AC/APT related allergies  Assessment: 76yo F with history of CAD, HFrEF, HLD, Prediabetes, and NSTEMI in June 2022 who presents to ED with shortness of breath. CT angio chest positive for acute PE. Pharmacy consulted for management of heparin drip in the setting of pulmonary embolism.   Date Time aPTT/HL Rate/Comment   Goal of Therapy:  Heparin level 0.3-0.7 units/ml Monitor platelets by anticoagulation protocol: Yes   Plan:    STAT aPTT ordered Give 6000 units bolus x1; then start heparin infusion at 1250 units/hr Check anti-Xa level in 8 hours and  daily once consecutively therapeutic. Continue to monitor H&H and platelets daily while on heparin gtt.   Selinda Eon 01/28/2022,9:33 PM

## 2022-01-28 NOTE — Assessment & Plan Note (Addendum)
With ct showing GG infiltrate and suspected infection.  Completed 4 days of ceftriaxone and doxycycline in house.

## 2022-01-28 NOTE — Assessment & Plan Note (Signed)
Resolved

## 2022-01-28 NOTE — ED Notes (Signed)
Pt returned from CT °

## 2022-01-28 NOTE — Assessment & Plan Note (Addendum)
  Pt coming with acute hypoxic respiratory failure due to her  PE. Supplemental oxygen and heparin gtt. We will treat underlying cause.

## 2022-01-28 NOTE — Assessment & Plan Note (Addendum)
Resolved

## 2022-01-28 NOTE — ED Triage Notes (Signed)
Pt arrives to ED from home via [facility] with c/c of SoB starting yesterday afternoon. EMS states her initial O2 sats were in the low 80s and improved to 94% with 6L via [place]. Initial vitals 135/64, p90, R 22, O2 sat 80%, temp 97.4 oral. Upon arrival Pt A&O x4, NAD. Dr Fanny Bien at patient bedside.

## 2022-01-28 NOTE — Assessment & Plan Note (Addendum)
Likely due to PE and right heart strain.  Resolved.

## 2022-01-28 NOTE — Assessment & Plan Note (Addendum)
acute submassive right-sided pulmonary embolism with right heart strain  BiPap --> 4L [place] O2   Heparin drip (Dr Trula Slade was contacted by ED, not needing thrombolytic therapy) may be able to transition to po anticoagulant tomorrow   Echo EF 45-50, mild LVH, mild decreased wall motion c/w Grade I diastolic dysfunction, RV also mildly reduced systolic function, mod elevated PA pressure, density distal to PV artifact vs thrombus - messaged cardiology (Dr Saralyn Pilar) who agrees nothing else needing to be done for this finding   No DVT on BLE [place] so would not need IVC   Consider hypercoagulable panel (Factor V leiden, lupus anticoagulant, homocysteine, antithrombin III, protein C, protein S)

## 2022-01-28 NOTE — Assessment & Plan Note (Addendum)
Stable. Continue medications

## 2022-01-28 NOTE — Assessment & Plan Note (Signed)
Mild troponin elevation, remained flat  cont heparin gtt for PE

## 2022-01-29 ENCOUNTER — Inpatient Hospital Stay: Payer: Medicare Other

## 2022-01-29 ENCOUNTER — Inpatient Hospital Stay
Admit: 2022-01-29 | Discharge: 2022-01-29 | Disposition: A | Discharge status: Home / Self Care | Payer: Medicare Other | Attending: Internal Medicine | Admitting: Internal Medicine

## 2022-01-29 DIAGNOSIS — R8290 Unspecified abnormal findings in urine: Secondary | ICD-10-CM | POA: Diagnosis present

## 2022-01-29 DIAGNOSIS — J9601 Acute respiratory failure with hypoxia: Secondary | ICD-10-CM | POA: Diagnosis not present

## 2022-01-29 DIAGNOSIS — N179 Acute kidney failure, unspecified: Secondary | ICD-10-CM

## 2022-01-29 DIAGNOSIS — R8281 Pyuria: Secondary | ICD-10-CM | POA: Diagnosis present

## 2022-01-29 LAB — EXPECTORATED SPUTUM ASSESSMENT W GRAM STAIN, RFLX TO RESP C

## 2022-01-29 LAB — ECHOCARDIOGRAM COMPLETE
AR max vel: 1.54 cm2
AV Peak grad: 7.3 mmHg
Ao pk vel: 1.35 m/s
Area-P 1/2: 4.74 cm2
Calc EF: 48.1 %
Height: 66 in
S' Lateral: 3.15 cm
Single Plane A2C EF: 55.5 %
Single Plane A4C EF: 46 %
Weight: 2656 oz

## 2022-01-29 LAB — COMPREHENSIVE METABOLIC PANEL
ALT: 15 U/L (ref 0–44)
AST: 22 U/L (ref 15–41)
Albumin: 2.7 g/dL — ABNORMAL LOW (ref 3.5–5.0)
Alkaline Phosphatase: 99 U/L (ref 38–126)
Anion gap: 9 (ref 5–15)
BUN: 14 mg/dL (ref 8–23)
CO2: 24 mmol/L (ref 22–32)
Calcium: 7.4 mg/dL — ABNORMAL LOW (ref 8.9–10.3)
Chloride: 105 mmol/L (ref 98–111)
Creatinine, Ser: 0.9 mg/dL (ref 0.44–1.00)
GFR, Estimated: >60 mL/min (ref >60)
Glucose, Bld: 123 mg/dL — ABNORMAL HIGH (ref 70–99)
Potassium: 2.7 mmol/L — CL (ref 3.5–5.1)
Sodium: 138 mmol/L (ref 135–145)
Total Bilirubin: 2.7 mg/dL — ABNORMAL HIGH (ref 0.3–1.2)
Total Protein: 6.6 g/dL (ref 6.5–8.1)

## 2022-01-29 LAB — URINALYSIS, ROUTINE W REFLEX MICROSCOPIC
Glucose, UA: NEGATIVE mg/dL
Ketones, ur: 5 mg/dL — AB
Nitrite: NEGATIVE
Protein, ur: 100 mg/dL — AB
Specific Gravity, Urine: 1.04 — ABNORMAL HIGH (ref 1.005–1.030)
WBC, UA: >50 WBC/hpf — ABNORMAL HIGH (ref 0–5)
pH: 5 (ref 5.0–8.0)

## 2022-01-29 LAB — POTASSIUM: Potassium: 2.7 mmol/L — CL (ref 3.5–5.1)

## 2022-01-29 LAB — BASIC METABOLIC PANEL
Anion gap: 7 (ref 5–15)
BUN: 14 mg/dL (ref 8–23)
CO2: 22 mmol/L (ref 22–32)
Calcium: 7 mg/dL — ABNORMAL LOW (ref 8.9–10.3)
Chloride: 106 mmol/L (ref 98–111)
Creatinine, Ser: 0.77 mg/dL (ref 0.44–1.00)
GFR, Estimated: >60 mL/min (ref >60)
Glucose, Bld: 118 mg/dL — ABNORMAL HIGH (ref 70–99)
Potassium: 3.6 mmol/L (ref 3.5–5.1)
Sodium: 135 mmol/L (ref 135–145)

## 2022-01-29 LAB — TROPONIN I (HIGH SENSITIVITY): Troponin I (High Sensitivity): 27 ng/L — ABNORMAL HIGH (ref <18)

## 2022-01-29 LAB — CBC
HCT: 36.9 % (ref 36.0–46.0)
Hemoglobin: 12.1 g/dL (ref 12.0–15.0)
MCH: 30.9 pg (ref 26.0–34.0)
MCHC: 32.8 g/dL (ref 30.0–36.0)
MCV: 94.4 fL (ref 80.0–100.0)
Platelets: 169 10*3/uL (ref 150–400)
RBC: 3.91 MIL/uL (ref 3.87–5.11)
RDW: 14.6 % (ref 11.5–15.5)
WBC: 7.9 10*3/uL (ref 4.0–10.5)
nRBC: 0 % (ref 0.0–0.2)

## 2022-01-29 LAB — GLUCOSE, CAPILLARY
Glucose-Capillary: 102 mg/dL — ABNORMAL HIGH (ref 70–99)
Glucose-Capillary: 136 mg/dL — ABNORMAL HIGH (ref 70–99)
Glucose-Capillary: 146 mg/dL — ABNORMAL HIGH (ref 70–99)
Glucose-Capillary: 148 mg/dL — ABNORMAL HIGH (ref 70–99)

## 2022-01-29 LAB — BLOOD GAS, VENOUS
Acid-Base Excess: 1.3 mmol/L (ref 0.0–2.0)
Bicarbonate: 25.9 mmol/L (ref 20.0–28.0)
O2 Saturation: 63.3 %
Patient temperature: 37
pCO2, Ven: 40 mmHg — ABNORMAL LOW (ref 44–60)
pH, Ven: 7.42 (ref 7.25–7.43)
pO2, Ven: 36 mmHg (ref 32–45)

## 2022-01-29 LAB — MAGNESIUM: Magnesium: 2 mg/dL (ref 1.7–2.4)

## 2022-01-29 LAB — HIV ANTIBODY (ROUTINE TESTING W REFLEX): HIV Screen 4th Generation wRfx: NONREACTIVE

## 2022-01-29 LAB — HEPARIN LEVEL (UNFRACTIONATED)
Heparin Unfractionated: 0.3 IU/mL (ref 0.30–0.70)
Heparin Unfractionated: 0.79 IU/mL — ABNORMAL HIGH (ref 0.30–0.70)

## 2022-01-29 LAB — MRSA NEXT GEN BY PCR, NASAL: MRSA by PCR Next Gen: NOT DETECTED

## 2022-01-29 MED ORDER — POTASSIUM CHLORIDE CRYS ER 20 MEQ PO TBCR: 40.0000 meq | EXTENDED_RELEASE_TABLET | ORAL | Status: DC

## 2022-01-29 MED ORDER — METHYLPREDNISOLONE SODIUM SUCC 125 MG IJ SOLR
120.0000 mg | Freq: Two times a day (BID) | INTRAMUSCULAR | Status: DC
  Administered 2022-01-29 – 2022-02-01 (×7): 120 mg via INTRAVENOUS
  Filled 2022-01-29 (×7): qty 2

## 2022-01-29 MED ORDER — SODIUM CHLORIDE 0.45 % IV SOLN
INTRAVENOUS | Status: DC
  Filled 2022-01-29: qty 75

## 2022-01-29 MED ORDER — HYDROMORPHONE HCL 1 MG/ML IJ SOLN
0.2500 mg | INTRAMUSCULAR | Status: DC | PRN
  Administered 2022-01-29 (×2): 0.25 mg via INTRAVENOUS
  Filled 2022-01-29 (×2): qty 1

## 2022-01-29 MED ORDER — POTASSIUM CHLORIDE 10 MEQ/100ML IV SOLN
10.0000 meq | INTRAVENOUS | Status: DC
  Filled 2022-01-29 (×4): qty 100

## 2022-01-29 MED ORDER — CHLORHEXIDINE GLUCONATE CLOTH 2 % EX PADS
6.0000 | MEDICATED_PAD | Freq: Every day | CUTANEOUS | Status: DC
  Administered 2022-01-29 – 2022-02-01 (×4): 6 via TOPICAL

## 2022-01-29 MED ORDER — POTASSIUM CHLORIDE CRYS ER 20 MEQ PO TBCR
40.0000 meq | EXTENDED_RELEASE_TABLET | ORAL | Status: AC
  Administered 2022-01-29: 40 meq via ORAL
  Filled 2022-01-29: qty 2

## 2022-01-29 MED ORDER — SACUBITRIL-VALSARTAN 24-26 MG PO TABS
1.0000 | ORAL_TABLET | Freq: Two times a day (BID) | ORAL | Status: DC
  Filled 2022-01-29: qty 1

## 2022-01-29 MED ORDER — POTASSIUM CHLORIDE 10 MEQ/100ML IV SOLN
10.0000 meq | INTRAVENOUS | Status: AC
  Administered 2022-01-29 (×4): 10 meq via INTRAVENOUS
  Filled 2022-01-29 (×4): qty 100

## 2022-01-29 MED ORDER — HEPARIN (PORCINE) 25000 UT/250ML-% IV SOLN
1200.0000 [IU]/h | INTRAVENOUS | Status: DC
  Administered 2022-01-29: 1250 [IU]/h via INTRAVENOUS
  Administered 2022-01-29: 1400 [IU]/h via INTRAVENOUS
  Administered 2022-01-30 – 2022-01-31 (×2): 1200 [IU]/h via INTRAVENOUS
  Filled 2022-01-29 (×4): qty 250

## 2022-01-29 MED ORDER — HEPARIN BOLUS VIA INFUSION
4000.0000 [IU] | Freq: Once | INTRAVENOUS | Status: AC
  Administered 2022-01-29: 4000 [IU] via INTRAVENOUS
  Filled 2022-01-29: qty 4000

## 2022-01-29 MED ORDER — FUROSEMIDE 10 MG/ML IJ SOLN
40.0000 mg | Freq: Once | INTRAMUSCULAR | Status: AC
  Administered 2022-01-29: 40 mg via INTRAVENOUS
  Filled 2022-01-29: qty 4

## 2022-01-29 NOTE — Progress Notes (Signed)
Patient has had an eventful day, earlier in the shift patient was on 5L [place], patient's respirations increased to the 50s and oxygen saturations decreased to the low to mid 80s, respiratory therapy, Dr. Merrilee Jansky, and Dr. Sheppard Coil notified.  Blood gas ordered by Dr. Merrilee Jansky, patient placed on non-rebreather before being placed on bi-pap by respiratory.  Throughout the shift patient needed reminders to leave bipap in place to help with breathing and bipap frequently needed adjusting.  Patient's breathing has improved since on bipap with oxygen saturation in the 90s and respirations down to the 20s. Patient with no complaints offered at this time and resting in bed, will continue to monitor patient.

## 2022-01-29 NOTE — Progress Notes (Addendum)
PROGRESS NOTE    Tina Fields  [id number] DOB: 11/04/1945  DOA: 01/28/2022 Date of Service: 01/29/22 PCP: Oswaldo ConroyBender, Abby Daneele, MD     Brief Narrative / Hospital Course:  To ED 01/28/22 from home c/o 3 days SOB w/ productive cough, vomiting, fatigue. Chronic DOE but worsening. No O2 at home. Per EMS SpO2 80% RA. CTA in ED (+) R PE w/ R heart strain. Vascula recommended heparin gtt. Electrolyte derangements hypoK, hypoMg, elevated BNP. Started on Rocephin and Doxy for CAP.  05/28: patient was exhibiting respiratory distress this morning but improved on BiPap, ABG ordered 01/29/22 9:34 AM. Rales on exam, no LE edema, Echo pending --> lasix IV 40 mg x1 ordered. Reassessed later in afternoon, breathing easier still on BiPap, K improved, UA (+)concern for UTI pt already on rocephin awaiting UCx,   Consultants:  PCCU  Procedures: none    Subjective: Patient unable to verbally participate in interview - BiPap in place/ Per RN, respiratory distress, improved on BiPap. Later int he day, seen sleeping comfortably    ASSESSMENT & PLAN:   Principal Problem:   SOB (shortness of breath) Active Problems:   Acute pulmonary embolism (HCC)   HTN (hypertension)   Elevated troponin   COPD with asthma (HCC)   Coronary artery disease involving native coronary artery of native heart without angina pectoris   Hypomagnesemia   Hypokalemia   CAP (community acquired pneumonia)    Acute pulmonary embolism (HCC): subnassive right PE, PESI score 118 (high risk) acute submassive right-sided pulmonary embolism with right heart strain On stepdown unit. PCCU consulted Heparin drip per vascular (Dr Myra GianottiBrabham contacted by ED, not needing thrombolytic therapy) Echo pending No DVT on BLE US  Consider hypercoagulable panel (Factor V leiden, lupus anticoagulant, homocysteine, antithrombin III, protein C, protein S) Supplemental O2 to maintain SpO2 > 90%    HTN (hypertension) 119/56 on  admission --> 155/88 now Held Imdur/entresto/lasix due to low blood pressure. Restart med's once med rec done and if BP allows to prevent hypotension and / or syncope --> restarting entresto now Fall precaution.  Elevated troponin Mild troponin elevation, remained flat cont heparin gtt for PE    COPD with asthma (HCC) Stable,. cont home regimen albuterol.    Coronary artery disease involving native coronary artery of native heart without angina pectoris Cont statin and hold Imdur, entresto, and   lasix.   Acute respiratory failure with hypoxia (HCC) Pt coming with acute hypoxic respiratory failure due to her  PE. 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  PCCU following, patient was exhibiting respiratory distress this morning but improved on BiPap, ABG ordered 01/29/22 9:34 AM. Rales on exam, no LE edema, Echo pending --> lasix IV 40 mg x1 ordered, will reassess later today / prn  Ensure adequate pulmonary hygiene  F/u cultures, trend PCT Continue CAP coverage: ceftriaxone & doxycycline bronchodilators PRN  Hypomagnesemia Replaced follow levels.   Hypokalemia Replace and follow levels. Repeat BMP pending this AM   CAP (community acquired pneumonia) With ct showing GG infiltrate and suspected infection  Tx w/ CAP regimen. (Today 01/29/22 is Day 2 of antibiotics)   HFrEF (heart failure with reduced ejection fraction) (HCC) Troponin: 20, flat.  BNP: 686 Continuous cardiac monitoring  Strict I/O's: alert provider if UOP < 0.5 mL/kg/hr Daily BMP, replace electrolytes PRN Daily weights to assess volume status Diurese with  the use of IV lasix as tolerated Entresto restarted today, will restart lasix if BP remains stable  Abnormal urinalysis Suspect UTI Culture pending Already on rocephin for CAP coverage   AKI  (acute kidney injury) (HCC) - mild IV contrast exposure Baseline Cr: 0.88, Cr on admission: 1.02 gentle IVF hydration: started sodium bicarbonate drip at 75 mL/h for 6 h to protect kidneys in the setting of IV contrast without exacerbating CHF with a IVF bolus Daily BMP, replace electrolytes PRN Avoid nephrotoxic agents as able but felt benefit > risk of dosing lasix today given pulmonary rales and respiratory disterss closely follow BMP to ensure adequate renal perfusion        DVT prophylaxis: onheparin gtt Code Status: FULL Family Communication: will update later today.  Disposition Plan / TOC needs: remains inpateitn appropriate  Barriers to discharge / significant pending items: O2 support, IV meds              Objective: Vitals:   01/29/22 0400 01/29/22 0500 01/29/22 0700 01/29/22 0800  BP: (!) 138/45 (!) 149/74 (!) 141/80 (!) 155/88  Pulse: 99 (!) 104 (!) 107 (!) 107  Resp: (!) 40 (!) 52 (!) 33 17  Temp: 99.4 F (37.4 C)   98.7 F (37.1 C)  TempSrc: Oral   Oral  SpO2: 93% 91% 90% (!) 89%  Weight:      Height:        Intake/Output Summary (Last 24 hours) at 01/29/2022 0919 Last data filed at 01/29/2022 0455 Gross per 24 hour  Intake 1007.04 ml  Output 400 ml  Net 607.04 ml   Filed Weights   01/28/22 1803  Weight: 75.3 kg    Examination:  Constitutional:  VS as above General Appearance: alert, mild respiratory distress on BiPap Eyes: Normal lids and conjunctive, non-icteric sclera Ears, Nose, Mouth, Throat: Normal appearance Neck: No masses, trachea midline Respiratory: Increased respiratory effort Breath sounds diffuse rales  Cardiovascular: S1/S2 normal, no murmur/rub/gallop auscultated, no appreciable S4 No JVD No lower extremity edema Gastrointestinal: Nontender Musculoskeletal:  No clubbing/cyanosis of digits Neurological: No cranial nerve deficit on limited exa        Scheduled Medications:   atorvastatin  40 mg Oral q  morning   Chlorhexidine Gluconate Cloth  6 each Topical Daily   montelukast  10 mg Oral QHS   potassium chloride  40 mEq Oral Q4H    Continuous Infusions:  sodium chloride Stopped (01/29/22 0159)   cefTRIAXone (ROCEPHIN)  IV     doxycycline (VIBRAMYCIN) IV Stopped (01/29/22 0028)   heparin 1,250 Units/hr (01/29/22 0455)    PRN Medications:  albuterol, fluticasone  Antimicrobials:  Anti-infectives (From admission, onward)    Start     Dose/Rate Route Frequency Ordered Stop   01/29/22 2200  cefTRIAXone (ROCEPHIN) 2 g in sodium chloride 0.9 % 100 mL IVPB        2 g 200 mL/hr over 30 Minutes Intravenous Every 24 hours 01/28/22 2230 02/02/22 2159   01/28/22 2200  cefTRIAXone (ROCEPHIN) 2 g in sodium chloride 0.9 % 100 mL IVPB  Status:  Discontinued        2 g 200 mL/hr over 30 Minutes Intravenous Every 24 hours 01/28/22 2152 01/28/22 2230   01/28/22 2200  doxycycline (VIBRAMYCIN) 100 mg in sodium chloride 0.9 % 250 mL IVPB        100 mg 125 mL/hr over 120 Minutes Intravenous Every 12 hours 01/28/22 2152     01/28/22 2130  cefTRIAXone (ROCEPHIN)  2 g in sodium chloride 0.9 % 100 mL IVPB        2 g 200 mL/hr over 30 Minutes Intravenous  Once 01/28/22 2117 01/28/22 2306   01/28/22 2130  doxycycline (VIBRAMYCIN) 100 mg in sodium chloride 0.9 % 250 mL IVPB  Status:  Discontinued        100 mg 125 mL/hr over 120 Minutes Intravenous  Once 01/28/22 2117 01/28/22 2157       Data Reviewed: I have personally reviewed following labs and imaging studies  CBC: Recent Labs  Lab 01/28/22 1814 01/29/22 0236  WBC 7.2 7.9  NEUTROABS 5.7  --   HGB 12.1 12.1  HCT 37.2 36.9  MCV 95.4 94.4  PLT 183 169   Basic Metabolic Panel: Recent Labs  Lab 01/28/22 1814 01/29/22 0236  NA 141 138  K 2.5* 2.7*  2.7*  CL 104 105  CO2 27 24  GLUCOSE 131* 123*  BUN 17 14  CREATININE 1.02* 0.90  CALCIUM 7.5* 7.4*  MG 1.5* 2.0   GFR: Estimated Creatinine Clearance: 53.4 mL/min (by C-G formula  based on SCr of 0.9 mg/dL). Liver Function Tests: Recent Labs  Lab 01/28/22 1814 01/29/22 0236  AST 21 22  ALT 14 15  ALKPHOS 94 99  BILITOT 2.9* 2.7*  PROT 6.6 6.6  ALBUMIN 2.7* 2.7*   No results for input(s): LIPASE, AMYLASE in the last 168 hours. No results for input(s): AMMONIA in the last 168 hours. Coagulation Profile: Recent Labs  Lab 01/28/22 1814  INR 1.2   Cardiac Enzymes: No results for input(s): CKTOTAL, CKMB, CKMBINDEX, TROPONINI in the last 168 hours. BNP (last 3 results) No results for input(s): PROBNP in the last 8760 hours. HbA1C: No results for input(s): HGBA1C in the last 72 hours. CBG: Recent Labs  Lab 01/29/22 0746  GLUCAP 102*   Lipid Profile: No results for input(s): CHOL, HDL, LDLCALC, TRIG, CHOLHDL, LDLDIRECT in the last 72 hours. Thyroid Function Tests: No results for input(s): TSH, T4TOTAL, FREET4, T3FREE, THYROIDAB in the last 72 hours. Anemia Panel: No results for input(s): VITAMINB12, FOLATE, FERRITIN, TIBC, IRON, RETICCTPCT in the last 72 hours. Urine analysis:    Component Value Date/Time   COLORURINE AMBER (A) 01/29/2022 0800   APPEARANCEUR CLOUDY (A) 01/29/2022 0800   APPEARANCEUR Clear 01/16/2012 0248   LABSPEC 1.040 (H) 01/29/2022 0800   LABSPEC 1.009 01/16/2012 0248   PHURINE 5.0 01/29/2022 0800   GLUCOSEU NEGATIVE 01/29/2022 0800   GLUCOSEU Negative 01/16/2012 0248   HGBUR SMALL (A) 01/29/2022 0800   BILIRUBINUR SMALL (A) 01/29/2022 0800   BILIRUBINUR Negative 01/16/2012 0248   KETONESUR 5 (A) 01/29/2022 0800   PROTEINUR 100 (A) 01/29/2022 0800   NITRITE NEGATIVE 01/29/2022 0800   LEUKOCYTESUR LARGE (A) 01/29/2022 0800   LEUKOCYTESUR Negative 01/16/2012 0248   Sepsis Labs: (procalcitonin:4,lacticidven:4)  Recent Results (from the past 240 hour(s))  Resp Panel by RT-PCR (Flu A&B, Covid) Nasopharyngeal Swab     Status: None   Collection Time: 01/28/22  6:14 PM   Specimen: Nasopharyngeal Swab; Nasal Swab   Result Value Ref Range Status   SARS Coronavirus 2 by RT PCR NEGATIVE NEGATIVE Final    Comment: (NOTE) SARS-CoV-2 target nucleic acids are NOT DETECTED.  The SARS-CoV-2 RNA is generally detectable in upper respiratory specimens during the acute phase of infection. The lowest concentration of SARS-CoV-2 viral copies this assay can detect is 138 copies/mL. A negative result does not preclude SARS-Cov-2 infection and should not be used as  the sole basis for treatment or other patient management decisions. A negative result may occur with  improper specimen collection/handling, submission of specimen other than nasopharyngeal swab, presence of viral mutation(s) within the areas targeted by this assay, and inadequate number of viral copies(<138 copies/mL). A negative result must be combined with clinical observations, patient history, and epidemiological information. The expected result is Negative.  Fact Sheet for Patients:  BloggerCourse.com  Fact Sheet for Healthcare Providers:  SeriousBroker.it  This test is no t yet approved or cleared by the [place] FDA and  has been authorized for detection and/or diagnosis of SARS-CoV-2 by FDA under an Emergency Use Authorization (EUA). This EUA will remain  in effect (meaning this test can be used) for the duration of the COVID-19 declaration under Section 564(b)(1) of the Act, 21 U.S.C.section 360bbb-3(b)(1), unless the authorization is terminated  or revoked sooner.       Influenza A by PCR NEGATIVE NEGATIVE Final   Influenza B by PCR NEGATIVE NEGATIVE Final    Comment: (NOTE) The Xpert Xpress SARS-CoV-2/FLU/RSV plus assay is intended as an aid in the diagnosis of influenza from Nasopharyngeal swab specimens and should not be used as a sole basis for treatment. Nasal washings and aspirates are unacceptable for Xpert Xpress SARS-CoV-2/FLU/RSV testing.  Fact Sheet for  Patients: BloggerCourse.com  Fact Sheet for Healthcare Providers: SeriousBroker.it  This test is not yet approved or cleared by the [place] FDA and has been authorized for detection and/or diagnosis of SARS-CoV-2 by FDA under an Emergency Use Authorization (EUA). This EUA will remain in effect (meaning this test can be used) for the duration of the COVID-19 declaration under Section 564(b)(1) of the Act, 21 U.S.C. section 360bbb-3(b)(1), unless the authorization is terminated or revoked.  Performed at [facility], [address] [place], [place] [address]   Blood Culture (routine x 2)     Status: None (Preliminary result)   Collection Time: 01/28/22  6:14 PM   Specimen: BLOOD  Result Value Ref Range Status   Specimen Description BLOOD BLOOD LEFT HAND  Final   Special Requests   Final    BOTTLES DRAWN AEROBIC AND ANAEROBIC Blood Culture adequate volume   Culture   Final    NO GROWTH < 12 HOURS Performed at [facility], [address] [place], [place] [address]    Report Status PENDING  Incomplete  Blood Culture (routine x 2)     Status: None (Preliminary result)   Collection Time: 01/28/22  6:15 PM   Specimen: BLOOD  Result Value Ref Range Status   Specimen Description BLOOD RIGHT ANTECUBITAL  Final   Special Requests   Final    BOTTLES DRAWN AEROBIC AND ANAEROBIC Blood Culture adequate volume   Culture   Final    NO GROWTH < 12 HOURS Performed at [facility], [address] [place], [place] [address]    Report Status PENDING  Incomplete  Expectorated Sputum Assessment w Gram Stain, Rflx to Resp Cult     Status: None   Collection Time: 01/29/22  4:47 AM   Specimen: Expectorated Sputum  Result Value Ref Range Status   Specimen Description EXPECTORATED SPUTUM  Final   Special Requests NONE  Final   Sputum evaluation   Final    Sputum specimen not acceptable for testing.  Please  recollect.   Performed at [facility], [address] [place], [place] [address]    Report Status 01/29/2022 FINAL  Final  Expectorated Sputum Assessment w Gram Stain, Rflx to Resp Cult     Status: None   Collection Time: 01/29/22  6:39 AM   Specimen: Sputum  Result Value Ref Range Status   Specimen Description SPU  Final   Special Requests SPU  Final   Sputum evaluation   Final    Sputum specimen not acceptable for testing.  Please recollect.   Performed at [facility], [address], [place], [place] [address]    Report Status 01/29/2022 FINAL  Final         Radiology Studies last 96 hours: CT Angio Chest PE W and/or Wo Contrast  Addendum Date: 01/28/2022   ADDENDUM REPORT: 01/28/2022 20:55 ADDENDUM: Critical Value/emergent results were called by telephone at the time of interpretation on 01/28/2022 at 8:48 pm to provider MARK QUALE , who verbally acknowledged these results. Electronically Signed   By: Beckie Salts M.D.   On: 01/28/2022 20:55   Result Date: 01/28/2022 CLINICAL DATA:  Shortness of breath for 1 day.  Hypoxia. EXAM: CT ANGIOGRAPHY CHEST WITH CONTRAST TECHNIQUE: Multidetector CT imaging of the chest was performed using the standard protocol during bolus administration of intravenous contrast. Multiplanar CT image reconstructions and MIPs were obtained to evaluate the vascular anatomy. RADIATION DOSE REDUCTION: This exam was performed according to the departmental dose-optimization program which includes automated exposure control, adjustment of the mA and/or kV according to patient size and/or use of iterative reconstruction technique. CONTRAST:  50mL OMNIPAQUE IOHEXOL 350 MG/ML SOLN COMPARISON:  Portable chest obtained earlier today. Chest CT dated 11/25/2010. Abdomen and pelvis CT dated 03/04/2021 and limited right upper quadrant abdomen ultrasound dated 03/04/2021. FINDINGS: Cardiovascular: Enlarged heart. Atheromatous calcifications, including the  coronary arteries and aorta. Minimal pericardial effusion. Multiple moderate-sized pulmonary emboli on the right. The right ventricular to left ventricular ratio is 1.06. Mediastinum/Nodes: Interval multiple mildly enlarged mediastinal and bilateral hilar lymph nodes. Mildly prominent left axillary lymph nodes without significant change. Small to moderate-sized hiatal hernia. Normal appearing thyroid gland. Lungs/Pleura: Interval mosaic pattern of ground-glass opacity throughout both lungs. Mild peripheral patchy consolidation in both upper lobes. Bilateral paraseptal bullous changes. No pleural fluid. Upper Abdomen: Previously demonstrated multiple liver cysts. Partially included previously demonstrated bilateral upper pole cysts, including a large cyst indenting into the right lobe of the liver. Musculoskeletal: Thoracic and lower cervical spine degenerative changes. Review of the MIP images confirms the above findings. IMPRESSION: 1. Positive for acute PE with CTevidence of right heart strain (RV/LV Ratio = 1.06) consistent with at least submassive (intermediate risk) PE. The presence of right heart strain has been associated with an increased risk of morbidity and mortality. 2. Extensive bilateral was a ground-glass opacities with underlying changes of COPD. In the presence of interval mediastinal and bilateral hilar adenopathy, this is most likely infectious in origin with associated reactive adenopathy. 3. Cardiomegaly. 4. Previously demonstrated liver and bilateral renal cysts. 5.  Calcific coronary artery and aortic atherosclerosis. 6. Small to moderate-sized hiatal hernia. Aortic Atherosclerosis (ICD10-I70.0) and Emphysema (ICD10-J43.9). Electronically Signed: By: Beckie Salts M.D. On: 01/28/2022 20:46   US Venous Img Lower Bilateral (DVT)  Result Date: 01/29/2022 CLINICAL DATA:  Pulmonary embolism EXAM: BILATERAL LOWER EXTREMITY VENOUS DOPPLER ULTRASOUND TECHNIQUE: Gray-scale sonography with  compression, as well as color and duplex ultrasound, were performed to evaluate the deep venous system(s) from the level of the common femoral vein through the popliteal and proximal calf veins. COMPARISON:  03/02/2021 FINDINGS: VENOUS Normal compressibility of the common femoral,  superficial femoral, and popliteal veins, as well as the visualized calf veins. Visualized portions of profunda femoral vein and great saphenous vein unremarkable. No filling defects to suggest DVT on grayscale or color Doppler imaging. Doppler waveforms show normal direction of venous flow, normal respiratory plasticity and response to augmentation. OTHER None. Limitations: none IMPRESSION: No lower extremity DVT Electronically Signed   By: Acquanetta Belling M.D.   On: 01/29/2022 06:09   DG Chest Port 1 View  Result Date: 01/28/2022 CLINICAL DATA:  Questionable sepsis. EXAM: PORTABLE CHEST 1 VIEW COMPARISON:  AP chest 12/02/2021, CT chest 11/25/2010, chest two views 09/27/2021 FINDINGS: Cardiac silhouette is again mildly enlarged. Moderate calcification is again seen within the aortic arch. Chronic moderate bilateral interstitial thickening is similar to 12/02/2021 and 09/27/2021. This is again greatest within the lung bases. No definite pleural effusion. No pneumothorax. No acute skeletal abnormality. IMPRESSION: Moderate bilateral interstitial thickening is unchanged from multiple prior radiographs, likely chronic interstitial lung disease. No definite pneumonia. Electronically Signed   By: Neita Garnet M.D.   On: 01/28/2022 18:25            LOS: 1 day    Time spent: 50 minutes    Sunnie Nielsen, DO [facility] 01/29/2022, 9:19 AM   Staff may message me via secure chat in Epic  but this may not receive immediate response,  please page for urgent matters!  If 7PM-7AM, please contact night-coverage [email or web address]  Dictation software was used to generate the above note. Typos may occur and escape review,  as with typed/written notes. Please contact Dr Lyn Hollingshead directly for clarity if needed.

## 2022-01-29 NOTE — Assessment & Plan Note (Addendum)
Recent Labs    12/02/21 0435 12/03/21 0527 12/04/21 [id number] 12/05/21 0513 01/28/22 1814 01/29/22 0236 01/29/22 0915 01/30/22 0344 01/30/22 1653 02/01/22 0554  BUN 17 20 22  27* 17 14 14  29* 46* 39*  CREATININE 0.90 0.91 0.92 0.88 1.02* 0.90 0.77 0.86 1.20* 0.95  Resolved.  Advised against NSAID. -Recheck renal function at follow-up

## 2022-01-29 NOTE — Assessment & Plan Note (Signed)
Suspect UTI  Culture pending  Already on rocephin for CAP coverage

## 2022-01-29 NOTE — Progress Notes (Signed)
*  PRELIMINARY RESULTS* Echocardiogram 2D Echocardiogram has been performed.  Lenor Coffin 01/29/2022, 8:47 AM

## 2022-01-29 NOTE — Assessment & Plan Note (Signed)
Troponin: 20, flat.  BNP: 686  Continuous cardiac monitoring   Strict I/O's: alert provider if UOP < 0.5 mL/kg/hr Daily BMP, replace electrolytes PRN  Daily weights to assess volume status  Diurese with the use of IV lasix as tolerated  Entresto restarted, will restart lasix if BP remains stable

## 2022-01-29 NOTE — Consult Note (Addendum)
ANTICOAGULATION CONSULT NOTE - Initial Consult  Pharmacy Consult for heparin Indication: pulmonary embolus  Allergies  Allergen Reactions   Ace Inhibitors Cough    Other reaction(s): Cough    Hydrochlorothiazide Other (See Comments)    Cramps   Latex Hives   Azithromycin Rash    Patient Measurements: Height: 5\' 6"  (167.6 cm) Weight: 75.3 kg (166 lb) IBW/kg (Calculated) : 59.3 Heparin Dosing Weight: 74.5kg  Vital Signs: Temp: 98 F (36.7 C) (05/28 2000) Temp Source: Oral (05/28 2000) BP: 98/64 (05/28 2000) Pulse Rate: 69 (05/28 2000)  Labs: Recent Labs    01/28/22 1814 01/29/22 0236 01/29/22 0915 01/29/22 1842  HGB 12.1 12.1  --   --   HCT 37.2 36.9  --   --   PLT 183 169  --   --   APTT 33  --   --   --   LABPROT 15.5*  --   --   --   INR 1.2  --   --   --   HEPARINUNFRC  --   --  0.30 0.79*  CREATININE 1.02* 0.90 0.77  --   TROPONINIHS 20* 27*  --   --      Estimated Creatinine Clearance: 60.1 mL/min (by C-G formula based on SCr of 0.77 mg/dL).   Medical History: Past Medical History:  Diagnosis Date   Aortic atherosclerosis (HCC)    Asthma    CHF (congestive heart failure) (HCC)    Coronary artery disease    Dyspnea    Edema of both legs    GERD (gastroesophageal reflux disease)    HFrEF (heart failure with reduced ejection fraction) (HCC)    History of 2019 novel coronavirus disease (COVID-19) 10/28/2020   Hypertension    Myocardial infarction (HCC)    OSA (obstructive sleep apnea)    noncompliant with nocturnal PAP therapy   RA (rheumatoid arthritis) (HCC)     Medications:  PTA: N/A Inpatient: Heparin drip (5/27 >>)   Allergies: No AC/APT related allergies  Assessment: 76yo F with history of CAD, HFrEF, HLD, Prediabetes, and NSTEMI in June 2022 who presents to ED with shortness of breath. CT angio chest positive for acute PE. Pharmacy consulted for management of heparin drip in the setting of pulmonary embolism.    Date Time HL Rate/Comment 5/28 0915 0.30 1250 units/h 5/28 1842 0.79 SUPRAtherapeutic  Goal of Therapy:  Heparin level 0.3-0.7 units/ml Monitor platelets by anticoagulation protocol: Yes   Plan:    5/28@1842 : HL 0.79, slightly above goal Will increase heparin infusion to 1300 units/hr given previously borderline therapeutic at 1250 units/hr Check anti-Xa level in 8 hours and daily once consecutively therapeutic. Continue to monitor H&H and platelets daily while on heparin gtt.   Selinda Eon 01/29/2022,10:15 PM

## 2022-01-29 NOTE — Consult Note (Signed)
NAME:  Tina Fields, MRN:  [id number], DOB:  1946-08-05, LOS: 1 ADMISSION DATE:  01/28/2022, CONSULTATION DATE:  01/29/22 REFERRING MD:  Dr. Renaldo Reel, CHIEF COMPLAINT:  Shortness of Breath   History of Present Illness:  76 yo F presenting to [facility] ED on 01/28/22 from home where she lives with her son with complaints of 3 days of shortness of breath. She reports chronic dyspnea with exertion, but does not need chronic O2 during the day or overnight. She reports she is normally able to get around the house well enough, but can not walk to the mailbox without fatigue and dyspnea. This SOB over the past 3 days was worse and accompanied by a productive cough with white sputum. She also admits to a vomiting episode with non-bloody bilious emesis after a coughing fit. She denies increased swelling or weight gain, however does report that the lasix she takes has not been making her void as much these last few days. She denies poor PO intake, and reports she takes all her medication as prescribed. She denies tobacco use, ETOH use or recreational drug use of any kind.  Per documentation EMS reported patient hypoxic at 80% on RA upon arrival. ED course: Upon arrival to ED she complained of increasing shortness of breath, denying CP or any discomfort. Work up included CTa which was positive for acute R PE. Dr. Fanny Bien spoke with Dr. Myra Gianotti with vascular who recommended heparin drip. TRH consulted for admission. Lab work showed hypokalemia, hypomagnesemia, elevated BNP, mildly elevated PCT.  Medications given: Rocephin, heparin bolus with drip initiation, IV contrast, 2 g Mg, 20 meq of K+ Initial Vitals: tachypneic 26, NSR 94, 137/58 & SpO2 98% on 6 L [place] Significant labs: (Labs/ Imaging personally reviewed) I, Cheryll Cockayne Rust-Chester, AGACNP-BC, personally viewed and interpreted this ECG. EKG Interpretation: Date: 01/28/22, EKG Time: 18:27, Rate: 97, Rhythm: NSR, QRS Axis: normal, Intervals: borderline QTc  prolongation, ST/T Wave abnormalities: V3-V4 ST depression, Narrative Interpretation: NSR with anterior ST depression Chemistry: Na+: 141, K+: 2.5, BUN/Cr.: 17/1.02, Serum CO2/ AG: 27/10, Mg: 1.5 Hematology: WBC: 7.2, Hgb: 12.1,  Troponin: 20, BNP: 686.9, Lactic/ PCT: 1.9 > 1.4/ 0.11, COVID-19 & Influenza A/B: negative Respiratory viral 20 pathogen panel: pending VBG: pending CXR 01/28/22: Moderate bilateral interstitial thickening unchanged from multiple prior radiographs, likely chronic interstitial lung disease. No definite pneumonia CT angio chest 01/28/22: Positive for acute PE with CT evidence of right heart strain (RV/LV ratio: 1.06) consistent with submassive PE. Extensive bilateral ground glass opacities with underlying changes of COPD. In the presence of interval mediastinal and bilateral hilar adenopathy, this is most likely infectious in origin with associated reactive adenopathy. Cardiomegaly. Previously demonstrated liver and bilateral renal cysts. Hiatal hernia.  PCCM consulted for additional assistance and management due to acute PE with right heart strain.  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  Interim History / Subjective:  Patient A&O x 4, on nasal cannula, VSS. She confirms dyspnea  but reports some improvement. No other complaints at this time. We discussed plan of care, all questions and concerns answered at  this time.  Objective   Blood pressure 130/83, pulse 87, temperature 97.6 F (36.4 C), temperature source Oral, resp. rate (!) 23, height 5\' 6"  (1.676 m), weight 75.3 kg, SpO2 91 %.        Intake/Output Summary (Last 24 hours) at 01/29/2022 0048 Last data filed at 01/28/2022 2306 Gross per 24 hour  Intake 300 ml  Output --  Net 300 ml   Filed Weights   01/28/22 1803  Weight: 75.3 kg    Examination: General: Adult female, critically ill, lying in bed, NAD HEENT: MM pink/moist, anicteric, atraumatic, neck supple Neuro: A&O x 4, able to follow commands, PERRL +3, MAE CV: s1s2 RRR, NSR with multifocal PVC's on monitor, no r/m/g Pulm: Regular, non labored on 5 L [place], breath sounds diminished throughout GI: soft, rounded, non tender, bs x 4 Skin:  no rashes/lesions noted Extremities: warm/dry, pulses + 2 R/P, +1 edema noted BLE  Resolved Hospital Problem list     Assessment & Plan:  Submassive Right Lobe Pulmonary embolism  PMHx: PESI score 118 (high risk) - continue Systemic Heparin drip per pharmacy protocol - Supplemental O2 to maintain SpO2 > 90% - Follow up BLE dopplers to assess for DVT - Consider hypercoagulable panel (Factor V leiden, lupus anticoagulant, homocysteine, antithrombin III, protein C, protein S) - Echocardiogram ordered by primary - consider Vascular consult if IVC filter is warranted  Acute Hypoxic Respiratory Failure secondary to submassive PE PMHx: OSA 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, additional w/u needed once stabilized, will need pulmonary f/u - Continue 5 L [place] overnight, wean FiO2 as tolerated - Supplemental O2 to maintain SpO2 > 90% - Intermittent chest x-ray & ABG PRN - Ensure adequate pulmonary hygiene  - F/u cultures, trend PCT - Continue CAP coverage: ceftriaxone & doxycycline - bronchodilators PRN  Chronic HFrEF exacerbation Elevated Troponin secondary to demand ischemia Troponin: 20 >  BNP: 686 - Continuous cardiac  monitoring  - Strict I/O's: alert provider if UOP < 0.5 mL/kg/hr - Daily BMP, replace electrolytes PRN - Daily weights to assess volume status - Diurese with the use of IV lasix as tolerated - Entresto continued by primary service - Cardiology consulted, appreciate input  Very mild Acute Kidney Injury  Hypokalemia Hypomagnesemia IV contrast exposure Baseline Cr: 0.88, Cr on admission: 1.02 - gentle IVF hydration: started sodium bicarbonate drip at 75 mL/h for 6 h to protect kidneys in the setting of IV contrast without exacerbating CHF with a IVF bolus - Daily BMP, replace electrolytes PRN - Avoid nephrotoxic agents as able, ensure adequate renal perfusion - STAT f/u K+ & Mg, replace PRN  Best Practice (right click and "Reselect all SmartList Selections" daily)  Diet/type: Regular consistency (see orders) DVT prophylaxis: systemic heparin GI prophylaxis: N/A Lines: N/A Foley:  N/A Code Status:  full code Last date of multidisciplinary goals of care discussion [per primary service]  Labs   CBC: Recent Labs  Lab 01/28/22 1814  WBC 7.2  NEUTROABS 5.7  HGB 12.1  HCT 37.2  MCV 95.4  PLT 183    Basic Metabolic Panel: Recent Labs  Lab 01/28/22 1814  NA 141  K 2.5*  CL 104  CO2 27  GLUCOSE 131*  BUN 17  CREATININE 1.02*  CALCIUM 7.5*  MG 1.5*   GFR: Estimated Creatinine Clearance: 47.1 mL/min (A) (by C-G formula based on  SCr of 1.02 mg/dL (H)). Recent Labs  Lab 01/28/22 1814 01/28/22 2044  PROCALCITON 0.11  --   WBC 7.2  --   LATICACIDVEN 1.9 1.4    Liver Function Tests: Recent Labs  Lab 01/28/22 1814  AST 21  ALT 14  ALKPHOS 94  BILITOT 2.9*  PROT 6.6  ALBUMIN 2.7*   No results for input(s): LIPASE, AMYLASE in the last 168 hours. No results for input(s): AMMONIA in the last 168 hours.  ABG No results found for: PHART, PCO2ART, PO2ART, HCO3, TCO2, ACIDBASEDEF, O2SAT   Coagulation Profile: Recent Labs  Lab 01/28/22 1814  INR 1.2     Cardiac Enzymes: No results for input(s): CKTOTAL, CKMB, CKMBINDEX, TROPONINI in the last 168 hours.  HbA1C: Hgb A1c MFr Bld  Date/Time Value Ref Range Status  08/21/2017 05:46 AM 6.6 (H) 4.8 - 5.6 % Final    Comment:    (NOTE)         Prediabetes: 5.7 - 6.4         Diabetes: >6.4         Glycemic control for adults with diabetes: <7.0     CBG: No results for input(s): GLUCAP in the last 168 hours.  Review of Systems: Positives in BOLD   Gen: Denies fever, chills, weight change, fatigue, night sweats HEENT: Denies blurred vision, double vision, hearing loss, tinnitus, sinus congestion, rhinorrhea, sore throat, neck stiffness, dysphagia PULM: Denies shortness of breath, cough, sputum production, hemoptysis, wheezing CV: Denies chest pain, edema, orthopnea, paroxysmal nocturnal dyspnea, palpitations GI: Denies abdominal pain, nausea, vomiting, diarrhea, hematochezia, melena, constipation, change in bowel habits GU: Denies dysuria, hematuria, polyuria, oliguria, urethral discharge Endocrine: Denies hot or cold intolerance, polyuria, polyphagia or appetite change Derm: Denies rash, dry skin, scaling or peeling skin change Heme: Denies easy bruising, bleeding, bleeding gums Neuro: Denies headache, numbness, weakness, slurred speech, loss of memory or consciousness  Past Medical History:  She,  has a past medical history of Aortic atherosclerosis (HCC), Asthma, CHF (congestive heart failure) (HCC), Coronary artery disease, Dyspnea, Edema of both legs, GERD (gastroesophageal reflux disease), HFrEF (heart failure with reduced ejection fraction) (HCC), History of 2019 novel coronavirus disease (COVID-19) (10/28/2020), Hypertension, Myocardial infarction (HCC), OSA (obstructive sleep apnea), and RA (rheumatoid arthritis) (HCC).   Surgical History:   Past Surgical History:  Procedure Laterality Date   BREAST SURGERY     CARDIAC CATHETERIZATION Left 06/21/2005   Moderate ostial LCx  and LAD disease; LVEF 45%; Location: [facility]; Surgeon: Adrian Blackwater, MD   CARDIAC CATHETERIZATION Left 05/20/2008   No occlusive CAD; LVEF 65%; Location: [facility]; Surgeon: Rudean Hitt, MD   COLONOSCOPY     ESOPHAGOGASTRODUODENOSCOPY     TUBAL LIGATION       Social History:   reports that she quit smoking about 27 years ago. Her smoking use included cigarettes. She has never used smokeless tobacco. She reports that she does not drink alcohol and does not use drugs.   Family History:  Her family history includes CAD in her father and mother; Diabetes in her father and mother; Hypertension in her father and mother; Stroke in her father.   Allergies Allergies  Allergen Reactions   Ace Inhibitors Cough    Other reaction(s): Cough    Hydrochlorothiazide Other (See Comments)    Cramps   Latex Hives   Azithromycin Rash     Home Medications  Prior to Admission medications   Medication Sig Start Date End Date Taking? Authorizing Provider  atorvastatin (  LIPITOR) 40 MG tablet Take 1 tablet (40 mg total) by mouth every morning. 12/05/21 03/05/22 Yes Darlin Drop, DO  [facility] VITAMIN B12 1000 MCG TBCR Take 1,000 mcg by mouth daily. 12/25/21  Yes [provider]  folic acid (FOLVITE) 1 MG tablet Take 1 mg by mouth daily. 09/19/21  Yes [provider]  furosemide (LASIX) 20 MG tablet Take 1 tablet (20 mg total) by mouth daily. 12/05/21 03/05/22 Yes Darlin Drop, DO  isosorbide mononitrate (IMDUR) 30 MG 24 hr tablet Take 30 mg by mouth daily.   Yes [provider]  montelukast (SINGULAIR) 10 MG tablet Take 10 mg by mouth at bedtime.   Yes [provider]  omeprazole (PRILOSEC) 20 MG capsule Take 20 mg by mouth 2 (two) times daily before a meal.   Yes [provider]  sacubitril-valsartan (ENTRESTO) 24-26 MG Take 1 tablet by mouth daily. Patient taking differently: Take 1 tablet by mouth daily. 12/06/21 03/06/22 Yes Darlin Drop, DO  acetaminophen (TYLENOL) 650 MG  CR tablet Take 1,300 mg by mouth as needed for pain.    [provider]  albuterol (PROVENTIL) (2.5 MG/3ML) 0.083% nebulizer solution Take 2.5 mg by nebulization every 6 (six) hours as needed for wheezing or shortness of breath. Patient not taking: Reported on 12/15/2021    [provider]  albuterol (VENTOLIN HFA) 108 (90 Base) MCG/ACT inhaler Inhale 1-2 puffs into the lungs every 6 (six) hours as needed for wheezing or shortness of breath. 11/06/20   [provider]  fluticasone (FLONASE) 50 MCG/ACT nasal spray Place 2 sprays into the nose daily as needed for allergies. 12/03/18   [provider]  HUMIRA PEN 40 MG/0.4ML PNKT Inject 40 mg into the skin every 14 (fourteen) days. 01/16/22   [provider]  ibuprofen (ADVIL) 200 MG tablet Take 200 mg by mouth as needed.    [provider]  methotrexate (RHEUMATREX) 2.5 MG tablet Take 7 tablets by mouth every Thursday. 10/15/21   [provider]  MIRALAX 17 GM/SCOOP powder Take 17 g by mouth daily as needed for moderate constipation. Dissolve 17 gm in 8 ounces of water and drink 01/11/21   [provider]     Critical care time: 58 minutes       Betsey Holiday, AGACNP-BC Acute Care Nurse Practitioner [facility]   [phone] / [phone] Please see Amion for pager details.

## 2022-01-29 NOTE — Plan of Care (Signed)
Continuing with plan of care. 

## 2022-01-29 NOTE — Consult Note (Signed)
ANTICOAGULATION CONSULT NOTE - Initial Consult  Pharmacy Consult for heparin Indication: pulmonary embolus  Allergies  Allergen Reactions   Ace Inhibitors Cough    Other reaction(s): Cough    Hydrochlorothiazide Other (See Comments)    Cramps   Latex Hives   Azithromycin Rash    Patient Measurements: Height: 5\' 6"  (167.6 cm) Weight: 75.3 kg (166 lb) IBW/kg (Calculated) : 59.3 Heparin Dosing Weight: 74.5kg  Vital Signs: Temp: 98.7 F (37.1 C) (05/28 0800) Temp Source: Oral (05/28 0800) BP: 142/128 (05/28 0900) Pulse Rate: 106 (05/28 0900)  Labs: Recent Labs    01/28/22 1814 01/29/22 0236 01/29/22 0915  HGB 12.1 12.1  --   HCT 37.2 36.9  --   PLT 183 169  --   APTT 33  --   --   LABPROT 15.5*  --   --   INR 1.2  --   --   HEPARINUNFRC  --   --  0.30  CREATININE 1.02* 0.90 0.77  TROPONINIHS 20* 27*  --      Estimated Creatinine Clearance: 60.1 mL/min (by C-G formula based on SCr of 0.77 mg/dL).   Medical History: Past Medical History:  Diagnosis Date   Aortic atherosclerosis (HCC)    Asthma    CHF (congestive heart failure) (HCC)    Coronary artery disease    Dyspnea    Edema of both legs    GERD (gastroesophageal reflux disease)    HFrEF (heart failure with reduced ejection fraction) (HCC)    History of 2019 novel coronavirus disease (COVID-19) 10/28/2020   Hypertension    Myocardial infarction (HCC)    OSA (obstructive sleep apnea)    noncompliant with nocturnal PAP therapy   RA (rheumatoid arthritis) (HCC)     Medications:  PTA: N/A Inpatient: Heparin drip (5/27 >>)   Allergies: No AC/APT related allergies  Assessment: 76yo F with history of CAD, HFrEF, HLD, Prediabetes, and NSTEMI in June 2022 who presents to ED with shortness of breath. CT angio chest positive for acute PE. Pharmacy consulted for management of heparin drip in the setting of pulmonary embolism.   Date Time aPTT/HL Rate/Comment 5/28 0915 0.30  1250 units/h  Goal of  Therapy:  Heparin level 0.3-0.7 units/ml Monitor platelets by anticoagulation protocol: Yes   Plan:    5/28@0915 : HL 0.30, lower end of goal Will increase heparin infusion to 1400 units/hr Check anti-Xa level in 8 hours and daily once consecutively therapeutic. Continue to monitor H&H and platelets daily while on heparin gtt.   Cherron Blitzer A Judit Awad 01/29/2022,11:36 AM

## 2022-01-29 NOTE — Progress Notes (Signed)
eLink Physician-Brief Progress Note Patient Name: Tina GroveJackie Fields  DOB: 04/09/1946 MRN: [id number]   Date of Service  01/29/2022  HPI/Events of Note  76 year old woman with acute PE and right heart strain. Case was d/w CCM and vascular, planned on heparin drip. HR and BP stable. O2 94 on nasal o2 and asleep in her room in no distress.   eICU Interventions  PCCM consulted and their note is pending Heparin drip per protocol. Echo in AM  Replace K and continue electrolyte checks  Call E link if needed     Intervention Category Major Interventions: Respiratory failure - evaluation and management Evaluation Type: New Patient Evaluation  Tina Fields 01/29/2022, 2:09 AM

## 2022-01-30 DIAGNOSIS — J9601 Acute respiratory failure with hypoxia: Secondary | ICD-10-CM | POA: Diagnosis not present

## 2022-01-30 LAB — BASIC METABOLIC PANEL
Anion gap: 10 (ref 5–15)
Anion gap: 10 (ref 5–15)
BUN: 29 mg/dL — ABNORMAL HIGH (ref 8–23)
BUN: 46 mg/dL — ABNORMAL HIGH (ref 8–23)
CO2: 22 mmol/L (ref 22–32)
CO2: 21 mmol/L — ABNORMAL LOW (ref 22–32)
Calcium: 7.6 mg/dL — ABNORMAL LOW (ref 8.9–10.3)
Calcium: 8 mg/dL — ABNORMAL LOW (ref 8.9–10.3)
Chloride: 109 mmol/L (ref 98–111)
Chloride: 106 mmol/L (ref 98–111)
Creatinine, Ser: 0.86 mg/dL (ref 0.44–1.00)
Creatinine, Ser: 1.2 mg/dL — ABNORMAL HIGH (ref 0.44–1.00)
GFR, Estimated: >60 mL/min (ref >60)
GFR, Estimated: 46 mL/min — ABNORMAL LOW (ref >60)
Glucose, Bld: 132 mg/dL — ABNORMAL HIGH (ref 70–99)
Glucose, Bld: 216 mg/dL — ABNORMAL HIGH (ref 70–99)
Potassium: 3.7 mmol/L (ref 3.5–5.1)
Potassium: 3.4 mmol/L — ABNORMAL LOW (ref 3.5–5.1)
Sodium: 141 mmol/L (ref 135–145)
Sodium: 137 mmol/L (ref 135–145)

## 2022-01-30 LAB — CBC
HCT: 33.9 % — ABNORMAL LOW (ref 36.0–46.0)
Hemoglobin: 11 g/dL — ABNORMAL LOW (ref 12.0–15.0)
MCH: 30.9 pg (ref 26.0–34.0)
MCHC: 32.4 g/dL (ref 30.0–36.0)
MCV: 95.2 fL (ref 80.0–100.0)
Platelets: 184 10*3/uL (ref 150–400)
RBC: 3.56 MIL/uL — ABNORMAL LOW (ref 3.87–5.11)
RDW: 14.6 % (ref 11.5–15.5)
WBC: 5 10*3/uL (ref 4.0–10.5)
nRBC: 0 % (ref 0.0–0.2)

## 2022-01-30 LAB — RESPIRATORY PANEL BY PCR

## 2022-01-30 LAB — PHOSPHORUS: Phosphorus: 3.6 mg/dL (ref 2.5–4.6)

## 2022-01-30 LAB — URINE CULTURE: Culture: <10000 — AB

## 2022-01-30 LAB — MAGNESIUM: Magnesium: 2 mg/dL (ref 1.7–2.4)

## 2022-01-30 LAB — HEPARIN LEVEL (UNFRACTIONATED)
Heparin Unfractionated: 0.76 IU/mL — ABNORMAL HIGH (ref 0.30–0.70)
Heparin Unfractionated: 0.44 IU/mL (ref 0.30–0.70)

## 2022-01-30 LAB — PROCALCITONIN: Procalcitonin: 0.3 ng/mL

## 2022-01-30 LAB — C-REACTIVE PROTEIN: CRP: 16.4 mg/dL — ABNORMAL HIGH (ref <1.0)

## 2022-01-30 LAB — GLUCOSE, CAPILLARY: Glucose-Capillary: 132 mg/dL — ABNORMAL HIGH (ref 70–99)

## 2022-01-30 LAB — SEDIMENTATION RATE: Sed Rate: 86 mm/hr — ABNORMAL HIGH (ref 0–30)

## 2022-01-30 MED ORDER — POLYETHYLENE GLYCOL 3350 17 G PO PACK: 17.0000 g | PACK | Freq: Every day | ORAL | Status: DC | PRN

## 2022-01-30 MED ORDER — IBUPROFEN 400 MG PO TABS
600.0000 mg | ORAL_TABLET | Freq: Four times a day (QID) | ORAL | Status: DC | PRN
Start: 2022-01-30 — End: 2022-02-01
  Administered 2022-01-30 – 2022-01-31 (×3): 600 mg via ORAL
  Filled 2022-01-30 (×3): qty 2

## 2022-01-30 MED ORDER — SENNOSIDES-DOCUSATE SODIUM 8.6-50 MG PO TABS
2.0000 | ORAL_TABLET | Freq: Two times a day (BID) | ORAL | Status: AC
Start: 2022-01-30 — End: 2022-01-30
  Administered 2022-01-30 (×2): 2 via ORAL
  Filled 2022-01-30 (×2): qty 2

## 2022-01-30 MED ORDER — POTASSIUM CHLORIDE CRYS ER 20 MEQ PO TBCR
40.0000 meq | EXTENDED_RELEASE_TABLET | Freq: Once | ORAL | Status: AC
Start: 2022-01-30 — End: 2022-01-30
  Administered 2022-01-30: 40 meq via ORAL
  Filled 2022-01-30: qty 2

## 2022-01-30 NOTE — Consult Note (Signed)
ANTICOAGULATION CONSULT NOTE -   Pharmacy Consult for heparin Indication: pulmonary embolus  Allergies  Allergen Reactions   Ace Inhibitors Cough    Other reaction(s): Cough    Hydrochlorothiazide Other (See Comments)    Cramps   Latex Hives   Azithromycin Rash    Patient Measurements: Height: 5\' 6"  (167.6 cm) Weight: 75.3 kg (166 lb) IBW/kg (Calculated) : 59.3 Heparin Dosing Weight: 74.5kg  Vital Signs: Temp: 97.3 F (36.3 C) (05/29 0800) Temp Source: Axillary (05/29 0800) BP: 113/55 (05/29 0800) Pulse Rate: 66 (05/29 0800)  Labs: Recent Labs    01/28/22 1814 01/29/22 0236 01/29/22 0915 01/29/22 1842 01/30/22 0344 01/30/22 0716  HGB 12.1 12.1  --   --  11.0*  --   HCT 37.2 36.9  --   --  33.9*  --   PLT 183 169  --   --  184  --   APTT 33  --   --   --   --   --   LABPROT 15.5*  --   --   --   --   --   INR 1.2  --   --   --   --   --   HEPARINUNFRC  --   --  0.30 0.79*  --  0.76*  CREATININE 1.02* 0.90 0.77  --  0.86  --   TROPONINIHS 20* 27*  --   --   --   --      Estimated Creatinine Clearance: 55.9 mL/min (by C-G formula based on SCr of 0.86 mg/dL).   Medical History: Past Medical History:  Diagnosis Date   Aortic atherosclerosis (HCC)    Asthma    CHF (congestive heart failure) (HCC)    Coronary artery disease    Dyspnea    Edema of both legs    GERD (gastroesophageal reflux disease)    HFrEF (heart failure with reduced ejection fraction) (HCC)    History of 2019 novel coronavirus disease (COVID-19) 10/28/2020   Hypertension    Myocardial infarction (HCC)    OSA (obstructive sleep apnea)    noncompliant with nocturnal PAP therapy   RA (rheumatoid arthritis) (HCC)     Medications:  PTA: N/A Inpatient: Heparin drip (5/27 >>)   Allergies: No AC/APT related allergies  Assessment: 76yo F with history of CAD, HFrEF, HLD, Prediabetes, and NSTEMI in June 2022 who presents to ED with shortness of breath. CT angio chest positive for acute PE.  Pharmacy consulted for management of heparin drip in the setting of pulmonary embolism.   Date Time HL Rate/Comment 5/28 0915 0.30 1250 units/h 5/28 1842 0.79 SUPRAtherapeutic 5/29 0716 0.76 Supra- decrease drip from 1300 u/hr to 1200 u/hr  Goal of Therapy:  Heparin level 0.3-0.7 units/ml Monitor platelets by anticoagulation protocol: Yes   Plan:    5/29 0716 HL=0.76  still slightly above goal Will decrease heparin infusion from 1300 units/hr to 1200 units/hr  reCheck anti-Xa level in 8 hours and daily once consecutively therapeutic. Continue to monitor H&H and platelets daily while on heparin gtt.   Sharnetta Gielow A 01/30/2022,8:12 AM

## 2022-01-30 NOTE — Progress Notes (Signed)
Pt off bipap on 5L [place], sats 95%, respiratory rate 16/min, no shortness of breath noted at this time.

## 2022-01-30 NOTE — Progress Notes (Signed)
NAME:  Tina Fields, MRN:  [id number], DOB:  Dec 31, 1945, LOS: 2 ADMISSION DATE:  01/28/2022, CONSULTATION DATE:  01/29/22 REFERRING MD:  Dr. Renaldo Reel, CHIEF COMPLAINT:  Shortness of Breath   History of Present Illness:  76 yo F presenting to [facility] ED on 01/28/22 from home where she lives with her son with complaints of 3 days of shortness of breath. She reports chronic dyspnea with exertion, but does not need chronic O2 during the day or overnight. She reports she is normally able to get around the house well enough, but can not walk to the mailbox without fatigue and dyspnea. This SOB over the past 3 days was worse and accompanied by a productive cough with white sputum. She also admits to a vomiting episode with non-bloody bilious emesis after a coughing fit. She denies increased swelling or weight gain, however does report that the lasix she takes has not been making her void as much these last few days. She denies poor PO intake, and reports she takes all her medication as prescribed. She denies tobacco use, ETOH use or recreational drug use of any kind.  Per documentation EMS reported patient hypoxic at 80% on RA upon arrival. ED course: Upon arrival to ED she complained of increasing shortness of breath, denying CP or any discomfort. Work up included CTa which was positive for acute R PE. Dr. Fanny Bien spoke with Dr. Myra Gianotti with vascular who recommended heparin drip. TRH consulted for admission. Lab work showed hypokalemia, hypomagnesemia, elevated BNP, mildly elevated PCT.  Medications given: Rocephin, heparin bolus with drip initiation, IV contrast, 2 g Mg, 20 meq of K+ Initial Vitals: tachypneic 26, NSR 94, 137/58 & SpO2 98% on 6 L [place] Significant labs: (Labs/ Imaging personally reviewed) I, Cheryll Cockayne Rust-Chester, AGACNP-BC, personally viewed and interpreted this ECG. EKG Interpretation: Date: 01/28/22, EKG Time: 18:27, Rate: 97, Rhythm: NSR, QRS Axis: normal, Intervals: borderline QTc  prolongation, ST/T Wave abnormalities: V3-V4 ST depression, Narrative Interpretation: NSR with anterior ST depression Chemistry: Na+: 141, K+: 2.5, BUN/Cr.: 17/1.02, Serum CO2/ AG: 27/10, Mg: 1.5 Hematology: WBC: 7.2, Hgb: 12.1,  Troponin: 20, BNP: 686.9, Lactic/ PCT: 1.9 > 1.4/ 0.11, COVID-19 & Influenza A/B: negative Respiratory viral 20 pathogen panel: pending VBG: pending CXR 01/28/22: Moderate bilateral interstitial thickening unchanged from multiple prior radiographs, likely chronic interstitial lung disease. No definite pneumonia CT angio chest 01/28/22: Positive for acute PE with CT evidence of right heart strain (RV/LV ratio: 1.06) consistent with submassive PE. Extensive bilateral ground glass opacities with underlying changes of COPD. In the presence of interval mediastinal and bilateral hilar adenopathy, this is most likely infectious in origin with associated reactive adenopathy. Cardiomegaly. Previously demonstrated liver and bilateral renal cysts. Hiatal hernia.  PCCM consulted for additional assistance and management due to acute PE with right heart strain.  01/30/22- patient with episodes of tachypnea.    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  Interim History / Subjective:  Patient A&O  x 4, on nasal cannula, VSS. She confirms dyspnea but reports some improvement. No other complaints at this time. We discussed  plan of care, all questions and concerns answered at this time.  Objective   Blood pressure (!) 113/55, pulse 66, temperature (!) 97.3 F (36.3 C), temperature source Axillary, resp. rate (!) 35, height  (1.676 m), weight 75.3 kg, SpO2 96 %.    FiO2 (%):  [50 %] 50 %   Intake/Output Summary (Last 24 hours) at 01/30/2022 1059 Last data filed at 01/30/2022 0809 Gross per 24 hour  Intake 898.46 ml  Output 400 ml  Net 498.46 ml    Filed Weights   01/28/22 1803  Weight: 75.3 kg    Examination: General: Adult female, critically ill, lying in bed, NAD HEENT: MM pink/moist, anicteric, atraumatic, neck supple Neuro: A&O x 4, able to follow commands, PERRL +3, MAE CV: s1s2 RRR, NSR with multifocal PVC's on monitor, no r/m/g Pulm: Regular, non labored on 5 L [place], breath sounds diminished throughout GI: soft, rounded, non tender, bs x 4 Skin:  no rashes/lesions noted Extremities: warm/dry, pulses + 2 R/P, +1 edema noted BLE  Resolved Hospital Problem list     Assessment & Plan:  Submassive Right Lobe Pulmonary embolism  PMHx: PESI score 118 (high risk) - continue Systemic Heparin drip per pharmacy protocol - Supplemental O2 to maintain SpO2 > 90% - Follow up BLE dopplers to assess for DVT - Consider hypercoagulable panel (Factor V leiden, lupus anticoagulant, homocysteine, antithrombin III, protein C, protein S) - Echocardiogram ordered by primary - consider Vascular consult if IVC filter is warranted  Acute Hypoxic Respiratory Failure secondary to submassive PE PMHx: OSA 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, additional w/u needed once stabilized, will need pulmonary f/u - Continue 5 L [place] overnight, wean FiO2 as tolerated - Supplemental O2 to maintain SpO2 > 90% - Intermittent chest x-ray & ABG PRN - Ensure adequate pulmonary hygiene  - F/u cultures, trend PCT - Continue CAP coverage: ceftriaxone & doxycycline - bronchodilators PRN  Chronic HFrEF  exacerbation Elevated Troponin secondary to demand ischemia Troponin: 20 >  BNP: 686 - Continuous cardiac monitoring  - Strict I/O's: alert provider if UOP < 0.5 mL/kg/hr - Daily BMP, replace electrolytes PRN - Daily weights to assess volume status - Diurese with the use of IV lasix as tolerated - Entresto continued by primary service - Cardiology consulted, appreciate input  Very mild Acute Kidney Injury  Hypokalemia Hypomagnesemia IV contrast exposure Baseline Cr: 0.88, Cr on admission: 1.02 - gentle IVF hydration: started sodium bicarbonate drip at 75 mL/h for 6 h to protect kidneys in the setting of IV contrast without exacerbating CHF with a IVF bolus - Daily BMP, replace electrolytes PRN - Avoid nephrotoxic agents as able, ensure adequate renal perfusion - STAT f/u K+ & Mg, replace PRN  Best Practice (right click and "Reselect all SmartList Selections" daily)  Diet/type: Regular consistency (see orders) DVT prophylaxis: systemic heparin GI prophylaxis: N/A Lines: N/A Foley:  N/A Code Status:  full code Last date of multidisciplinary goals of care discussion [per primary service]  Labs   CBC: Recent Labs  Lab 01/28/22 1814 01/29/22 0236 01/30/22 0344  WBC 7.2 7.9 5.0  NEUTROABS 5.7  --   --   HGB 12.1 12.1 11.0*  HCT 37.2 36.9 33.9*  MCV 95.4 94.4 95.2  PLT 183 169 184     Basic Metabolic Panel: Recent Labs  Lab 01/28/22 1814 01/29/22 0236  01/29/22 0915 01/30/22 0344  NA 141 138 135 141  K 2.5* 2.7*  2.7* 3.6 3.7  CL 104 105 106 109  CO2 27 24 22 22   GLUCOSE 131* 123* 118* 132*  BUN 17 14 14  29*  CREATININE 1.02* 0.90 0.77 0.86  CALCIUM 7.5* 7.4* 7.0* 7.6*  MG 1.5* 2.0  --  2.0  PHOS  --   --   --  3.6    GFR: Estimated Creatinine Clearance: 55.9 mL/min (by C-G formula based on SCr of 0.86 mg/dL). Recent Labs  Lab 01/28/22 1814 01/28/22 2044 01/29/22 0236 01/30/22 0344  PROCALCITON 0.11  --   --  0.30  WBC 7.2  --  7.9 5.0  LATICACIDVEN  1.9 1.4  --   --      Liver Function Tests: Recent Labs  Lab 01/28/22 1814 01/29/22 0236  AST 21 22  ALT 14 15  ALKPHOS 94 99  BILITOT 2.9* 2.7*  PROT 6.6 6.6  ALBUMIN 2.7* 2.7*    No results for input(s): LIPASE, AMYLASE in the last 168 hours. No results for input(s): AMMONIA in the last 168 hours.  ABG    Component Value Date/Time   HCO3 25.9 01/29/2022 0236   O2SAT 63.3 01/29/2022 0236     Coagulation Profile: Recent Labs  Lab 01/28/22 1814  INR 1.2     Cardiac Enzymes: No results for input(s): CKTOTAL, CKMB, CKMBINDEX, TROPONINI in the last 168 hours.  HbA1C: Hgb A1c MFr Bld  Date/Time Value Ref Range Status  08/21/2017 05:46 AM 6.6 (H) 4.8 - 5.6 % Final    Comment:    (NOTE)         Prediabetes: 5.7 - 6.4         Diabetes: >6.4         Glycemic control for adults with diabetes: <7.0     CBG: Recent Labs  Lab 01/29/22 0746 01/29/22 1135 01/29/22 1609 01/29/22 2352  GLUCAP 102* 136* 148* 146*    Review of Systems: Positives in BOLD   Gen: Denies fever, chills, weight change, fatigue, night sweats HEENT: Denies blurred vision, double vision, hearing loss, tinnitus, sinus congestion, rhinorrhea, sore throat, neck stiffness, dysphagia PULM: Denies shortness of breath, cough, sputum production, hemoptysis, wheezing CV: Denies chest pain, edema, orthopnea, paroxysmal nocturnal dyspnea, palpitations GI: Denies abdominal pain, nausea, vomiting, diarrhea, hematochezia, melena, constipation, change in bowel habits GU: Denies dysuria, hematuria, polyuria, oliguria, urethral discharge Endocrine: Denies hot or cold intolerance, polyuria, polyphagia or appetite change Derm: Denies rash, dry skin, scaling or peeling skin change Heme: Denies easy bruising, bleeding, bleeding gums Neuro: Denies headache, numbness, weakness, slurred speech, loss of memory or consciousness  Past Medical History:  She,  has a past medical history of Aortic atherosclerosis  (HCC), Asthma, CHF (congestive heart failure) (HCC), Coronary artery disease, Dyspnea, Edema of both legs, GERD (gastroesophageal reflux disease), HFrEF (heart failure with reduced ejection fraction) (HCC), History of 2019 novel coronavirus disease (COVID-19) (10/28/2020), Hypertension, Myocardial infarction (HCC), OSA (obstructive sleep apnea), and RA (rheumatoid arthritis) (HCC).   Surgical History:   Past Surgical History:  Procedure Laterality Date   BREAST SURGERY     CARDIAC CATHETERIZATION Left 06/21/2005   Moderate ostial LCx and LAD disease; LVEF 45%; Location: [facility]; Surgeon: Adrian Blackwater, MD   CARDIAC CATHETERIZATION Left 05/20/2008   No occlusive CAD; LVEF 65%; Location: [facility]; Surgeon: Rudean Hitt, MD   COLONOSCOPY     ESOPHAGOGASTRODUODENOSCOPY     TUBAL LIGATION  Social History:   reports that she quit smoking about 27 years ago. Her smoking use included cigarettes. She has never used smokeless tobacco. She reports that she does not drink alcohol and does not use drugs.   Family History:  Her family history includes CAD in her father and mother; Diabetes in her father and mother; Hypertension in her father and mother; Stroke in her father.   Allergies Allergies  Allergen Reactions   Ace Inhibitors Cough    Other reaction(s): Cough    Hydrochlorothiazide Other (See Comments)    Cramps   Latex Hives   Azithromycin Rash     Home Medications  Prior to Admission medications   Medication Sig Start Date End Date Taking? Authorizing Provider  atorvastatin (LIPITOR) 40 MG tablet Take 1 tablet (40 mg total) by mouth every morning. 12/05/21 03/05/22 Yes Darlin Drop, DO  [facility] VITAMIN B12 1000 MCG TBCR Take 1,000 mcg by mouth daily. 12/25/21  Yes [provider]  folic acid (FOLVITE) 1 MG tablet Take 1 mg by mouth daily. 09/19/21  Yes [provider]  furosemide (LASIX) 20 MG tablet Take 1 tablet (20 mg total) by mouth daily. 12/05/21 03/05/22 Yes Darlin Drop, DO  isosorbide mononitrate (IMDUR) 30 MG 24 hr tablet Take 30 mg by mouth daily.   Yes [provider]  montelukast (SINGULAIR) 10 MG tablet Take 10 mg by mouth at bedtime.   Yes [provider]  omeprazole (PRILOSEC) 20 MG capsule Take 20 mg by mouth 2 (two) times daily before a meal.   Yes [provider]  sacubitril-valsartan (ENTRESTO) 24-26 MG Take 1 tablet by mouth daily. Patient taking differently: Take 1 tablet by mouth daily. 12/06/21 03/06/22 Yes Darlin Drop, DO  acetaminophen (TYLENOL) 650 MG CR tablet Take 1,300 mg by mouth as needed for pain.    [provider]  albuterol (PROVENTIL) (2.5 MG/3ML) 0.083% nebulizer solution Take 2.5 mg by nebulization every 6 (six) hours as needed for wheezing or shortness of breath. Patient not taking: Reported on 12/15/2021    [provider]  albuterol (VENTOLIN HFA) 108 (90 Base) MCG/ACT inhaler Inhale 1-2 puffs into the lungs every 6 (six) hours as needed for wheezing or shortness of breath. 11/06/20   [provider]  fluticasone (FLONASE) 50 MCG/ACT nasal spray Place 2 sprays into the nose daily as needed for allergies. 12/03/18   [provider]  HUMIRA PEN 40 MG/0.4ML PNKT Inject 40 mg into the skin every 14 (fourteen) days. 01/16/22   [provider]  ibuprofen (ADVIL) 200 MG tablet Take 200 mg by mouth as needed.    [provider]  methotrexate (RHEUMATREX) 2.5 MG tablet Take 7 tablets by mouth every Thursday. 10/15/21   [provider]  MIRALAX 17 GM/SCOOP powder Take 17 g by mouth daily as needed for moderate constipation. Dissolve 17 gm in 8 ounces of water and drink 01/11/21   [provider]     Critical care provider statement:   Total critical care time: 33 minutes   Performed by: Karna Christmas MD   Critical care time was exclusive of separately billable procedures and treating other patients.   Critical care was necessary to treat  or prevent imminent or life-threatening deterioration.   Critical care was time spent personally by me on the following activities: development of treatment plan with patient and/or surrogate as well as nursing, discussions with consultants, evaluation of patient's response to treatment, examination of patient,  obtaining history from patient or surrogate, ordering and performing treatments and interventions, ordering and review of laboratory studies, ordering and review of radiographic studies, pulse oximetry and re-evaluation of patient's condition.    Vida Rigger, M.D.  Pulmonary & Critical Care Medicine

## 2022-01-30 NOTE — Progress Notes (Signed)
PROGRESS NOTE    Nevina Yorker  [id number] DOB: [birth year]  DOA: 01/28/2022 Date of Service: 01/30/22 PCP: Letta Median, MD     Brief Narrative / Hospital Course:  To ED 01/28/22 from home c/o 3 days SOB w/ productive cough, vomiting, fatigue. Chronic DOE but worsening. No O2 at home. Per EMS SpO2 80% RA. CTA in ED (+) R PE w/ R heart strain. Vascula recommended heparin gtt. Electrolyte derangements hypoK, hypoMg, elevated BNP. Started on Rocephin and Doxy for CAP.  05/28: patient was exhibiting respiratory distress this morning but improved on BiPap, ABG ordered 01/29/22 9:34 AM. Rales on exam, no LE edema, Echo pending --> lasix IV 40 mg x1 ordered. Reassessed later in afternoon, breathing easier still on BiPap, K improved, UA (+)concern for UTI pt already on rocephin awaiting UCx 05/29: off BiPap and stable on [place]. Alert, very pleasant and talkative, reports she is feeling much better. Echo EF 45-50, mild LVH, mild decreased wall motion c/w Grade I diastolic dysfunction, RV also mildly reduced systolic function, mod elevated PA pressure, density distal to PV artifact vs thrombus  Consultants:  PCCU  Procedures: none    Subjective: Patient is alert, very pleasant and talkative, speaking in full sentences, states she is feelign a lot better now. Still some SOB but no chest pain.     ASSESSMENT & PLAN:   Principal Problem:   Acute respiratory failure with hypoxia (HCC) Active Problems:   Acute pulmonary embolism ([place]): subnassive right PE, PESI score 118 (high risk)   HTN (hypertension)   Elevated troponin   COPD with asthma (HCC)   Coronary artery disease involving native coronary artery of native heart without angina pectoris   HFrEF (heart failure with reduced ejection fraction) (HCC)   Hypomagnesemia   Hypokalemia   CAP (community acquired pneumonia)   Abnormal urinalysis   AKI (acute kidney injury) ([place]) - mild    Acute pulmonary embolism ([place]):  subnassive right PE, PESI score 118 (high risk) acute submassive right-sided pulmonary embolism with right heart strain On stepdown unit. PCCU following, pt off BiPap now.  Heparin drip (Dr Trula Slade was contacted by ED, not needing thrombolytic therapy) may be able to transition to po anticoagulant tomorrow  Echo EF 45-50, mild LVH, mild decreased wall motion c/w Grade I diastolic dysfunction, RV also mildly reduced systolic function, mod elevated PA pressure, density distal to PV artifact vs thrombus - messaged cardiology (Dr Saralyn Pilar) who agrees nothing else needing to be done for this finding  No DVT on BLE [place] so would not need IVC  Consider hypercoagulable panel (Factor V leiden, lupus anticoagulant, homocysteine, antithrombin III, protein C, protein S) Supplemental O2 to maintain SpO2 > 90%   HTN (hypertension) 119/56 on admission --> 155/88 now Held Imdur/entresto/lasix due to low blood pressure. Restart med's once med rec done and if BP allows to prevent hypotension and / or syncope --> restarting entresto now Fall precaution.  Elevated troponin Mild troponin elevation, remained flat cont heparin gtt for PE    COPD with asthma ([place]) Stable,. cont home regimen albuterol.    Coronary artery disease involving native coronary artery of native heart without angina pectoris Cont statin and hold Imdur, entresto, and   lasix.   Acute respiratory failure with hypoxia (HCC) Pt coming with acute hypoxic respiratory failure due to her  PE. 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  PCCU following, patient was exhibiting respiratory distress on morning 05/28 but improved on BiPap, off BiPap as of 05/29 and doing well, transferred to progressive unit  Ensure adequate pulmonary hygiene  F/u cultures, trend PCT Continue CAP coverage: ceftriaxone &  doxycycline bronchodilators PRN  Hypomagnesemia Replaced follow levels.   Hypokalemia Replace and follow levels. Repeat BMP pending this AM   CAP (community acquired pneumonia) With ct showing GG infiltrate and suspected infection  Tx w/ CAP regimen. (Today 01/30/22 is Day 3 of antibiotics)   HFrEF (heart failure with reduced ejection fraction) (HCC) Troponin: 20, flat.  BNP: 686 Continuous cardiac monitoring  Strict I/O's: alert provider if UOP < 0.5 mL/kg/hr Daily BMP, replace electrolytes PRN Daily weights to assess volume status Diurese with the use of IV lasix as tolerated Entresto restarted today, will restart lasix if BP remains stable  Abnormal urinalysis Suspect UTI Culture pending Already on rocephin for CAP coverage   AKI (acute kidney injury) (HCC) - mild IV contrast exposure Baseline Cr: 0.88, Cr on admission: 1.02 gentle IVF hydration: started sodium bicarbonate drip at 75 mL/h for 6 h to protect kidneys in the setting of IV contrast without exacerbating CHF with a IVF bolus Daily BMP, replace electrolytes PRN Avoid nephrotoxic agents as able but felt benefit > risk of dosing lasix today given pulmonary rales and respiratory disterss closely follow BMP to ensure adequate renal perfusion        DVT prophylaxis: on heparin gtt Code Status: FULL Family Communication: pt requests call to sin, will reach out to him later today.   Disposition Plan / TOC needs: remains inpatient appropriate, SDU --> PCU today.  Barriers to discharge / significant pending items: O2 support, IV meds, risk decompensation              Objective: Vitals:   01/30/22 0500 01/30/22 0600 01/30/22 0700 01/30/22 0800  BP: (!) 111/59 109/65 101/82 (!) 113/55  Pulse: 66 62 69 66  Resp: (!) 32 (!) 32 (!) 25 (!) 35  Temp:    (!) 97.3 F (36.3 C)  TempSrc:    Axillary  SpO2: (!) 89% 94% 97% 96%  Weight:      Height:        Intake/Output Summary (Last 24 hours) at  01/30/2022 1138 Last data filed at 01/30/2022 0809 Gross per 24 hour  Intake 886.02 ml  Output 400 ml  Net 486.02 ml    Filed Weights   01/28/22 1803  Weight: 75.3 kg    Examination:  Constitutional:  VS as above General Appearance: alert, no distress Eyes: Normal lids and conjunctive, non-icteric sclera Ears, Nose, Mouth, Throat: Normal appearance Neck: No masses, trachea midline Respiratory: Increased respiratory effort Breath sounds improved, clear, mild rales at bases  Cardiovascular: S1/S2 normal, no murmur/rub/gallop auscultated, no appreciable S4 No JVD No lower extremity edema Gastrointestinal: Nontender Musculoskeletal:  No clubbing/cyanosis of digits Neurological: No cranial nerve deficit on limited exa        Scheduled Medications:   atorvastatin  40 mg Oral q morning   Chlorhexidine Gluconate Cloth  6 each Topical Daily   methylPREDNISolone (SOLU-MEDROL) injection  120 mg Intravenous Q12H   montelukast  10 mg Oral QHS   senna-docusate  2 tablet Oral BID    Continuous Infusions:  cefTRIAXone (ROCEPHIN)  IV Stopped (01/29/22 2226)   doxycycline (VIBRAMYCIN) IV 100 mg (01/30/22 0945)   heparin 1,200 Units/hr (01/30/22 0850)    PRN Medications:  albuterol, fluticasone, HYDROmorphone (DILAUDID) injection,  ibuprofen, polyethylene glycol  Antimicrobials:  Anti-infectives (From admission, onward)    Start     Dose/Rate Route Frequency Ordered Stop   01/29/22 2200  cefTRIAXone (ROCEPHIN) 2 g in sodium chloride 0.9 % 100 mL IVPB        2 g 200 mL/hr over 30 Minutes Intravenous Every 24 hours 01/28/22 2230 02/02/22 2159   01/28/22 2200  cefTRIAXone (ROCEPHIN) 2 g in sodium chloride 0.9 % 100 mL IVPB  Status:  Discontinued        2 g 200 mL/hr over 30 Minutes Intravenous Every 24 hours 01/28/22 2152 01/28/22 2230   01/28/22 2200  doxycycline (VIBRAMYCIN) 100 mg in sodium chloride 0.9 % 250 mL IVPB        100 mg 125 mL/hr over 120 Minutes  Intravenous Every 12 hours 01/28/22 2152     01/28/22 2130  cefTRIAXone (ROCEPHIN) 2 g in sodium chloride 0.9 % 100 mL IVPB        2 g 200 mL/hr over 30 Minutes Intravenous  Once 01/28/22 2117 01/28/22 2306   01/28/22 2130  doxycycline (VIBRAMYCIN) 100 mg in sodium chloride 0.9 % 250 mL IVPB  Status:  Discontinued        100 mg 125 mL/hr over 120 Minutes Intravenous  Once 01/28/22 2117 01/28/22 2157       Data Reviewed: I have personally reviewed following labs and imaging studies  CBC: Recent Labs  Lab 01/28/22 1814 01/29/22 0236 01/30/22 0344  WBC 7.2 7.9 5.0  NEUTROABS 5.7  --   --   HGB 12.1 12.1 11.0*  HCT 37.2 36.9 33.9*  MCV 95.4 94.4 95.2  PLT 183 169 [id number]    Basic Metabolic Panel: Recent Labs  Lab 01/28/22 1814 01/29/22 0236 01/29/22 0915 01/30/22 0344  NA 141 138 135 141  K 2.5* 2.7*  2.7* 3.6 3.7  CL 104 105 106 109  CO2 27 24 22 22   GLUCOSE 131* 123* 118* 132*  BUN 17 14 14  29*  CREATININE 1.02* 0.90 0.77 0.86  CALCIUM 7.5* 7.4* 7.0* 7.6*  MG 1.5* 2.0  --  2.0  PHOS  --   --   --  3.6    GFR: Estimated Creatinine Clearance: 55.9 mL/min (by C-G formula based on SCr of 0.86 mg/dL). Liver Function Tests: Recent Labs  Lab 01/28/22 1814 01/29/22 0236  AST 21 22  ALT 14 15  ALKPHOS 94 99  BILITOT 2.9* 2.7*  PROT 6.6 6.6  ALBUMIN 2.7* 2.7*    No results for input(s): LIPASE, AMYLASE in the last 168 hours. No results for input(s): AMMONIA in the last 168 hours. Coagulation Profile: Recent Labs  Lab 01/28/22 1814  INR 1.2    Cardiac Enzymes: No results for input(s): CKTOTAL, CKMB, CKMBINDEX, TROPONINI in the last 168 hours. BNP (last 3 results) No results for input(s): PROBNP in the last 8760 hours. HbA1C: No results for input(s): HGBA1C in the last 72 hours. CBG: Recent Labs  Lab 01/29/22 0746 01/29/22 1135 01/29/22 1609 01/29/22 2352  GLUCAP 102* 136* 148* 146*    Lipid Profile: No results for input(s): CHOL, HDL, LDLCALC,  TRIG, CHOLHDL, LDLDIRECT in the last 72 hours. Thyroid Function Tests: No results for input(s): TSH, T4TOTAL, FREET4, T3FREE, THYROIDAB in the last 72 hours. Anemia Panel: No results for input(s): VITAMINB12, FOLATE, FERRITIN, TIBC, IRON, RETICCTPCT in the last 72 hours. Urine analysis:    Component Value Date/Time   COLORURINE AMBER (A) 01/29/2022 0800   APPEARANCEUR CLOUDY (  A) 01/29/2022 0800   APPEARANCEUR Clear 01/16/2012 0248   LABSPEC 1.040 (H) 01/29/2022 0800   LABSPEC 1.009 01/16/2012 0248   PHURINE 5.0 01/29/2022 0800   GLUCOSEU NEGATIVE 01/29/2022 0800   GLUCOSEU Negative 01/16/2012 0248   HGBUR SMALL (A) 01/29/2022 0800   BILIRUBINUR SMALL (A) 01/29/2022 0800   BILIRUBINUR Negative 01/16/2012 0248   KETONESUR 5 (A) 01/29/2022 0800   PROTEINUR 100 (A) 01/29/2022 0800   NITRITE NEGATIVE 01/29/2022 0800   LEUKOCYTESUR LARGE (A) 01/29/2022 0800   LEUKOCYTESUR Negative 01/16/2012 0248   Sepsis Labs: @LABRCNTIP (procalcitonin:4,lacticidven:4)  Recent Results (from the past 240 hour(s))  Urine Culture     Status: Abnormal   Collection Time: 01/28/22  6:02 PM   Specimen: In/Out Cath Urine  Result Value Ref Range Status   Specimen Description   Final    IN/OUT CATH URINE Performed at [facility], [address], [place], [place] [address]    Special Requests   Final    NONE Performed at [facility], [address] [place], [place] [address]    Culture (A)  Final    <10,000 COLONIES/mL MULTIPLE SPECIES PRESENT, SUGGEST RECOLLECTION   Report Status 01/30/2022 FINAL  Final  Resp Panel by RT-PCR (Flu A&B, Covid) Nasopharyngeal Swab     Status: None   Collection Time: 01/28/22  6:14 PM   Specimen: Nasopharyngeal Swab; Nasal Swab  Result Value Ref Range Status   SARS Coronavirus 2 by RT PCR NEGATIVE NEGATIVE Final    Comment: (NOTE) SARS-CoV-2 target nucleic acids are NOT DETECTED.  The SARS-CoV-2 RNA is generally detectable in upper  respiratory specimens during the acute phase of infection. The lowest concentration of SARS-CoV-2 viral copies this assay can detect is 138 copies/mL. A negative result does not preclude SARS-Cov-2 infection and should not be used as the sole basis for treatment or other patient management decisions. A negative result may occur with  improper specimen collection/handling, submission of specimen other than nasopharyngeal swab, presence of viral mutation(s) within the areas targeted by this assay, and inadequate number of viral copies(<138 copies/mL). A negative result must be combined with clinical observations, patient history, and epidemiological information. The expected result is Negative.  Fact Sheet for Patients:  EntrepreneurPulse.com.au  Fact Sheet for Healthcare Providers:  IncredibleEmployment.be  This test is no t yet approved or cleared by the [place] FDA and  has been authorized for detection and/or diagnosis of SARS-CoV-2 by FDA under an Emergency Use Authorization (EUA). This EUA will remain  in effect (meaning this test can be used) for the duration of the COVID-19 declaration under Section 564(b)(1) of the Act, 21 U.S.C.section 360bbb-3(b)(1), unless the authorization is terminated  or revoked sooner.       Influenza A by PCR NEGATIVE NEGATIVE Final   Influenza B by PCR NEGATIVE NEGATIVE Final    Comment: (NOTE) The Xpert Xpress SARS-CoV-2/FLU/RSV plus assay is intended as an aid in the diagnosis of influenza from Nasopharyngeal swab specimens and should not be used as a sole basis for treatment. Nasal washings and aspirates are unacceptable for Xpert Xpress SARS-CoV-2/FLU/RSV testing.  Fact Sheet for Patients: EntrepreneurPulse.com.au  Fact Sheet for Healthcare Providers: IncredibleEmployment.be  This test is not yet approved or cleared by the [place] FDA and has been  authorized for detection and/or diagnosis of SARS-CoV-2 by FDA under an Emergency Use Authorization (EUA). This EUA will remain in effect (meaning this test can be used) for the duration of the COVID-19 declaration  under Section 564(b)(1) of the Act, 21 U.S.C. section 360bbb-3(b)(1), unless the authorization is terminated or revoked.  Performed at [facility], [address] [place], [place] [address]   Blood Culture (routine x 2)     Status: None (Preliminary result)   Collection Time: 01/28/22  6:14 PM   Specimen: BLOOD  Result Value Ref Range Status   Specimen Description BLOOD BLOOD LEFT HAND  Final   Special Requests   Final    BOTTLES DRAWN AEROBIC AND ANAEROBIC Blood Culture adequate volume   Culture   Final    NO GROWTH 2 DAYS Performed at [facility], [address], [place], [place] [address]    Report Status PENDING  Incomplete  Blood Culture (routine x 2)     Status: None (Preliminary result)   Collection Time: 01/28/22  6:15 PM   Specimen: BLOOD  Result Value Ref Range Status   Specimen Description BLOOD RIGHT ANTECUBITAL  Final   Special Requests   Final    BOTTLES DRAWN AEROBIC AND ANAEROBIC Blood Culture adequate volume   Culture   Final    NO GROWTH 2 DAYS Performed at [facility], [address], [place], [place] [address]    Report Status PENDING  Incomplete  MRSA Next Gen by PCR, Nasal     Status: None   Collection Time: 01/29/22  1:00 AM   Specimen: Nasal Mucosa; Nasal Swab  Result Value Ref Range Status   MRSA by PCR Next Gen NOT DETECTED NOT DETECTED Final    Comment: (NOTE) The GeneXpert MRSA Assay (FDA approved for NASAL specimens only), is one component of a comprehensive MRSA colonization surveillance program. It is not intended to diagnose MRSA infection nor to guide or monitor treatment for MRSA infections. Test performance is not FDA approved in patients less than 49 years old. Performed at [facility], [place], [place] [address]   Expectorated Sputum Assessment w Gram Stain, Rflx to Resp Cult     Status: None   Collection Time: 01/29/22  4:47 AM   Specimen: Expectorated Sputum  Result Value Ref Range Status   Specimen Description EXPECTORATED SPUTUM  Final   Special Requests NONE  Final   Sputum evaluation   Final    Sputum specimen not acceptable for testing.  Please recollect.   Performed at [facility], [address] [place], [place] [address]    Report Status 01/29/2022 FINAL  Final  Expectorated Sputum Assessment w Gram Stain, Rflx to Resp Cult     Status: None   Collection Time: 01/29/22  6:39 AM   Specimen: Sputum  Result Value Ref Range Status   Specimen Description SPU  Final   Special Requests SPU  Final   Sputum evaluation   Final    Sputum specimen not acceptable for testing.  Please recollect.   Performed at [facility], [address], [place], [place] [address]    Report Status 01/29/2022 FINAL  Final         Radiology Studies last 96 hours: CT Angio Chest PE W and/or Wo Contrast  Addendum Date: 01/28/2022   ADDENDUM REPORT: 01/28/2022 20:55 ADDENDUM: Critical Value/emergent results were called by telephone at the time of interpretation on 01/28/2022 at 8:48 pm to provider MARK QUALE , who verbally acknowledged these results. Electronically Signed   By: Claudie Revering M.D.   On: 01/28/2022 20:55   Result Date: 01/28/2022 CLINICAL DATA:  Shortness of  breath for 1 day.  Hypoxia. EXAM: CT ANGIOGRAPHY CHEST WITH CONTRAST TECHNIQUE: Multidetector CT imaging of the chest was performed using the standard protocol during bolus administration of intravenous contrast. Multiplanar CT image reconstructions and MIPs were obtained to evaluate the vascular anatomy. RADIATION DOSE REDUCTION: This exam was performed according to the departmental dose-optimization program which includes automated exposure control, adjustment of the mA  and/or kV according to patient size and/or use of iterative reconstruction technique. CONTRAST:  90mL OMNIPAQUE IOHEXOL 350 MG/ML SOLN COMPARISON:  Portable chest obtained earlier today. Chest CT dated 11/25/2010. Abdomen and pelvis CT dated 03/04/2021 and limited right upper quadrant abdomen ultrasound dated 03/04/2021. FINDINGS: Cardiovascular: Enlarged heart. Atheromatous calcifications, including the coronary arteries and aorta. Minimal pericardial effusion. Multiple moderate-sized pulmonary emboli on the right. The right ventricular to left ventricular ratio is 1.06. Mediastinum/Nodes: Interval multiple mildly enlarged mediastinal and bilateral hilar lymph nodes. Mildly prominent left axillary lymph nodes without significant change. Small to moderate-sized hiatal hernia. Normal appearing thyroid gland. Lungs/Pleura: Interval mosaic pattern of ground-glass opacity throughout both lungs. Mild peripheral patchy consolidation in both upper lobes. Bilateral paraseptal bullous changes. No pleural fluid. Upper Abdomen: Previously demonstrated multiple liver cysts. Partially included previously demonstrated bilateral upper pole cysts, including a large cyst indenting into the right lobe of the liver. Musculoskeletal: Thoracic and lower cervical spine degenerative changes. Review of the MIP images confirms the above findings. IMPRESSION: 1. Positive for acute PE with CTevidence of right heart strain (RV/LV Ratio = 1.06) consistent with at least submassive (intermediate risk) PE. The presence of right heart strain has been associated with an increased risk of morbidity and mortality. 2. Extensive bilateral was a ground-glass opacities with underlying changes of COPD. In the presence of interval mediastinal and bilateral hilar adenopathy, this is most likely infectious in origin with associated reactive adenopathy. 3. Cardiomegaly. 4. Previously demonstrated liver and bilateral renal cysts. 5.  Calcific coronary artery  and aortic atherosclerosis. 6. Small to moderate-sized hiatal hernia. Aortic Atherosclerosis (ICD10-I70.0) and Emphysema (ICD10-J43.9). Electronically Signed: By: Claudie Revering M.D. On: 01/28/2022 20:46   US Venous Img Lower Bilateral (DVT)  Result Date: 01/29/2022 CLINICAL DATA:  Pulmonary embolism EXAM: BILATERAL LOWER EXTREMITY VENOUS DOPPLER ULTRASOUND TECHNIQUE: Gray-scale sonography with compression, as well as color and duplex ultrasound, were performed to evaluate the deep venous system(s) from the level of the common femoral vein through the popliteal and proximal calf veins. COMPARISON:  03/02/2021 FINDINGS: VENOUS Normal compressibility of the common femoral, superficial femoral, and popliteal veins, as well as the visualized calf veins. Visualized portions of profunda femoral vein and great saphenous vein unremarkable. No filling defects to suggest DVT on grayscale or color Doppler imaging. Doppler waveforms show normal direction of venous flow, normal respiratory plasticity and response to augmentation. OTHER None. Limitations: none IMPRESSION: No lower extremity DVT Electronically Signed   By: Miachel Roux M.D.   On: 01/29/2022 06:09   DG Chest Port 1 View  Result Date: 01/28/2022 CLINICAL DATA:  Questionable sepsis. EXAM: PORTABLE CHEST 1 VIEW COMPARISON:  AP chest 12/02/2021, CT chest 11/25/2010, chest two views 09/27/2021 FINDINGS: Cardiac silhouette is again mildly enlarged. Moderate calcification is again seen within the aortic arch. Chronic moderate bilateral interstitial thickening is similar to 12/02/2021 and 09/27/2021. This is again greatest within the lung bases. No definite pleural effusion. No pneumothorax. No acute skeletal abnormality. IMPRESSION: Moderate bilateral interstitial thickening is unchanged from multiple prior radiographs, likely chronic interstitial lung disease. No definite pneumonia. Electronically Signed  By: Yvonne Kendall M.D.   On: 01/28/2022 18:25    ECHOCARDIOGRAM COMPLETE  Result Date: 01/29/2022    ECHOCARDIOGRAM REPORT   Patient Name:   NAOKO COPPA Behan Date of Exam: 01/29/2022 Medical Rec #:  [id number]             Height:       66.0 in Accession #:    [id number]            Weight:       166.0 lb Date of Birth:  Feb 01, 1946              BSA:          1.848 m Patient Age:    76 years              BP:           149/74 mmHg Patient Gender: F                     HR:           106 bpm. Exam Location:  [facility] Procedure: 2D Echo Indications:     Pulmonary Embolus I26.09  History:         Patient has prior history of Echocardiogram examinations, most                  recent 03/03/2021.  Sonographer:     Kathlen Brunswick RDCS Referring Phys:  [id number] V PATEL Diagnosing Phys: [place]  1. Left ventricular ejection fraction, by estimation, is 45 to 50%. Left ventricular ejection fraction by 2D MOD biplane is 48.1 %. The left ventricle has mildly decreased function. The left ventricle demonstrates regional wall motion abnormalities (see  scoring diagram/findings for description). There is mild left ventricular hypertrophy. Left ventricular diastolic parameters are consistent with Grade I diastolic dysfunction (impaired relaxation).  2. Right ventricular systolic function is mildly reduced. The right ventricular size is mildly enlarged. There is moderately elevated pulmonary artery systolic pressure.  3. The mitral valve is normal in structure. Mild mitral valve regurgitation.  4. The aortic valve is normal in structure. Aortic valve regurgitation is not visualized. No aortic stenosis is present.  5. Echo density distal to PV. Artifact versus possibly thrombus in setting of PE. Comparison(s): Compared to echo from [facility] in 2022, RV pressures are higher. Per report, EF sounds to be similar, with prior WMA present. FINDINGS  Left Ventricle: Left ventricular ejection fraction, by estimation, is 45 to 50%. Left ventricular ejection fraction by 2D MOD  biplane is 48.1 %. The left ventricle has mildly decreased function. The left ventricle demonstrates regional wall motion abnormalities. Mild hypokinesis of the left ventricular, mid-apical anterolateral wall. The left ventricular internal cavity size was normal in size. There is mild left ventricular hypertrophy. Left ventricular diastolic parameters are consistent with Grade I diastolic dysfunction (impaired relaxation). Right Ventricle: The right ventricular size is mildly enlarged. Right vetricular wall thickness was not well visualized. Right ventricular systolic function is mildly reduced. There is moderately elevated pulmonary artery systolic pressure. The tricuspid  regurgitant velocity is 3.66 m/s, and with an assumed right atrial pressure of 5 mmHg, the estimated right ventricular systolic pressure is 0000000 mmHg. Left Atrium: Left atrial size was normal in size. Right Atrium: Right atrial size was normal in size. Pericardium: There is no evidence of pericardial effusion. Mitral Valve: The mitral valve is normal in structure. Mild mitral valve regurgitation. Tricuspid Valve: The tricuspid  valve is normal in structure. Tricuspid valve regurgitation is mild. Aortic Valve: The aortic valve is normal in structure. Aortic valve regurgitation is not visualized. No aortic stenosis is present. Aortic valve peak gradient measures 7.3 mmHg. Pulmonic Valve: Echo density distal to PV. Artifact versus possibly thrombus in setting of PE. The pulmonic valve was not well visualized. Pulmonic valve regurgitation is trivial. No evidence of pulmonic stenosis. Aorta: The aortic root and ascending aorta are structurally normal, with no evidence of dilitation. Venous: The inferior vena cava was not well visualized. IAS/Shunts: The interatrial septum was not well visualized.  LEFT VENTRICLE PLAX 2D                        Biplane EF (MOD) LVIDd:         3.95 cm         LV Biplane EF:   Left LVIDs:         3.15 cm                           ventricular LV PW:         1.07 cm                          ejection LV IVS:        1.43 cm                          fraction by LVOT diam:     1.80 cm                          2D MOD LV SV:         27                               biplane is LV SV Index:   15                               48.1 %. LVOT Area:     2.54 cm                                Diastology                                LV e' medial:    5.11 cm/s LV Volumes (MOD)               LV E/e' medial:  17.2 LV vol d, MOD    85.9 ml       LV e' lateral:   5.22 cm/s A2C:                           LV E/e' lateral: 16.8 LV vol d, MOD    48.9 ml A4C: LV vol s, MOD    38.2 ml A2C: LV vol s, MOD    26.4 ml A4C: LV SV MOD A2C:   47.7 ml LV SV MOD A4C:   48.9 ml LV SV MOD BP:    32.6 ml RIGHT VENTRICLE RV Basal  diam:  3.66 cm RV S prime:     10.70 cm/s TAPSE (M-mode): 1.6 cm LEFT ATRIUM             Index        RIGHT ATRIUM           Index LA diam:        4.50 cm 2.44 cm/m   RA Area:     16.60 cm LA Vol (A2C):   55.4 ml 29.99 ml/m  RA Volume:   44.40 ml  24.03 ml/m LA Vol (A4C):   45.4 ml 24.57 ml/m LA Biplane Vol: 54.8 ml 29.66 ml/m  AORTIC VALVE                 PULMONIC VALVE AV Area (Vmax): 1.54 cm     PV Vmax:          0.94 m/s AV Vmax:        135.00 cm/s  PV Peak grad:     3.6 mmHg AV Peak Grad:   7.3 mmHg     PR End Diast Vel: 8.29 msec LVOT Vmax:      81.60 cm/s LVOT Vmean:     54.200 cm/s LVOT VTI:       0.108 m  AORTA Ao Root diam: 3.00 cm Ao Asc diam:  2.90 cm MITRAL VALVE               TRICUSPID VALVE MV Area (PHT): 4.74 cm    TV Peak grad:   36.7 mmHg MV Decel Time: 160 msec    TV Vmax:        3.03 m/s MV E velocity: 87.80 cm/s  TR Peak grad:   53.6 mmHg                            TR Vmax:        366.00 cm/s                             SHUNTS                            Systemic VTI:  0.11 m                            Systemic Diam: 1.80 cm Donnelly Angelica Electronically signed by Donnelly Angelica Signature Date/Time: 01/29/2022/11:03:27 AM     Final             LOS: 2 days    Time spent: 50 minutes    Emeterio Reeve, DO [facility] 01/30/2022, 11:39 AM   Staff may message me via secure chat in [place]  but this may not receive immediate response,  please page for urgent matters!  If 7PM-7AM, please contact night-coverage [email or web address]  Dictation software was used to generate the above note. Typos may occur and escape review, as with typed/written notes. Please contact Dr Sheppard Coil directly for clarity if needed.

## 2022-01-31 ENCOUNTER — Other Ambulatory Visit (HOSPITAL_COMMUNITY): Payer: Self-pay

## 2022-01-31 DIAGNOSIS — J9601 Acute respiratory failure with hypoxia: Secondary | ICD-10-CM | POA: Diagnosis not present

## 2022-01-31 LAB — CBC
HCT: 35.8 % — ABNORMAL LOW (ref 36.0–46.0)
Hemoglobin: 11.7 g/dL — ABNORMAL LOW (ref 12.0–15.0)
MCH: 30.5 pg (ref 26.0–34.0)
MCHC: 32.7 g/dL (ref 30.0–36.0)
MCV: 93.2 fL (ref 80.0–100.0)
Platelets: 266 10*3/uL (ref 150–400)
RBC: 3.84 MIL/uL — ABNORMAL LOW (ref 3.87–5.11)
RDW: 14.8 % (ref 11.5–15.5)
WBC: 11.7 10*3/uL — ABNORMAL HIGH (ref 4.0–10.5)
nRBC: 0 % (ref 0.0–0.2)

## 2022-01-31 LAB — PROCALCITONIN: Procalcitonin: 0.27 ng/mL

## 2022-01-31 LAB — MAGNESIUM: Magnesium: 2.2 mg/dL (ref 1.7–2.4)

## 2022-01-31 LAB — HEPARIN LEVEL (UNFRACTIONATED): Heparin Unfractionated: 0.44 IU/mL (ref 0.30–0.70)

## 2022-01-31 LAB — PHOSPHORUS: Phosphorus: 2.5 mg/dL (ref 2.5–4.6)

## 2022-01-31 MED ORDER — POTASSIUM CHLORIDE CRYS ER 20 MEQ PO TBCR
40.0000 meq | EXTENDED_RELEASE_TABLET | Freq: Once | ORAL | Status: AC
  Administered 2022-01-31: 40 meq via ORAL
  Filled 2022-01-31: qty 2

## 2022-01-31 MED ORDER — CALCIUM CARBONATE ANTACID 500 MG PO CHEW
2.0000 | CHEWABLE_TABLET | Freq: Two times a day (BID) | ORAL | Status: DC | PRN
  Administered 2022-01-31: 400 mg via ORAL
  Filled 2022-01-31: qty 2

## 2022-01-31 MED ORDER — APIXABAN 5 MG PO TABS: 5.0000 mg | ORAL_TABLET | Freq: Two times a day (BID) | ORAL | Status: DC

## 2022-01-31 MED ORDER — APIXABAN 5 MG PO TABS
10.0000 mg | ORAL_TABLET | Freq: Two times a day (BID) | ORAL | Status: DC
  Administered 2022-01-31 – 2022-02-01 (×2): 10 mg via ORAL
  Filled 2022-01-31 (×2): qty 2

## 2022-01-31 NOTE — TOC Benefit Eligibility Note (Signed)
Patient Advocate Encounter  Insurance verification completed.    The patient is currently admitted and upon discharge could be taking Eliquis 5 mg.  The current 30 day co-pay is, $0.00.   The patient is currently admitted and upon discharge could be taking Xarelto 20 mg.  The current 30 day co-pay is, $0.00.   The patient is insured through [employer] Medicare Part D     Deigo Alonso, CPhT Pharmacy Patient Advocate Specialist [facility] Patient Advocate Team Direct Number: [phone]  Fax: [phone]        

## 2022-01-31 NOTE — Care Management Important Message (Signed)
Important Message  Patient Details  Name: Tina GroveJackie Vincent  MRN: [id number] Date of Birth: 10/30/1945   Medicare Important Message Given:  N/A - LOS <3 / Initial given by admissions     Johnell ComingsHolly Vanissa Strength 01/31/2022, 11:05 AM

## 2022-01-31 NOTE — Consult Note (Signed)
ANTICOAGULATION CONSULT NOTE -   Pharmacy Consult for heparin Indication: pulmonary embolus  Allergies  Allergen Reactions   Ace Inhibitors Cough    Other reaction(s): Cough    Hydrochlorothiazide Other (See Comments)    Cramps   Latex Hives   Azithromycin Rash    Patient Measurements: Height: 5\' 6"  (167.6 cm) Weight: 75.3 kg (166 lb) IBW/kg (Calculated) : 59.3 Heparin Dosing Weight: 74.5kg  Vital Signs: Temp: 97.6 F (36.4 C) (05/30 0334) Temp Source: Axillary (05/30 0334) BP: 128/78 (05/30 0334) Pulse Rate: 80 (05/29 2330)  Labs: Recent Labs    01/28/22 1814 01/29/22 0236 01/29/22 0915 01/29/22 1842 01/30/22 0344 01/30/22 0716 01/30/22 1653 01/31/22 0056 01/31/22 0607  HGB 12.1 12.1  --   --  11.0*  --   --   --  11.7*  HCT 37.2 36.9  --   --  33.9*  --   --   --  35.8*  PLT 183 169  --   --  184  --   --   --  266  APTT 33  --   --   --   --   --   --   --   --   LABPROT 15.5*  --   --   --   --   --   --   --   --   INR 1.2  --   --   --   --   --   --   --   --   HEPARINUNFRC  --   --  0.30   < >  --  0.76* 0.44 0.44  --   CREATININE 1.02* 0.90 0.77  --  0.86  --  1.20*  --   --   TROPONINIHS 20* 27*  --   --   --   --   --   --   --    < > = values in this interval not displayed.     Estimated Creatinine Clearance: 40.1 mL/min (A) (by C-G formula based on SCr of 1.2 mg/dL (H)).   Medical History: Past Medical History:  Diagnosis Date   Aortic atherosclerosis (HCC)    Asthma    CHF (congestive heart failure) (HCC)    Coronary artery disease    Dyspnea    Edema of both legs    GERD (gastroesophageal reflux disease)    HFrEF (heart failure with reduced ejection fraction) (HCC)    History of 2019 novel coronavirus disease (COVID-19) 10/28/2020   Hypertension    Myocardial infarction (HCC)    OSA (obstructive sleep apnea)    noncompliant with nocturnal PAP therapy   RA (rheumatoid arthritis) (HCC)     Medications:  PTA: N/A Inpatient:  Heparin drip (5/27 >>)   Allergies: No AC/APT related allergies  Assessment: 76yo F with history of CAD, HFrEF, HLD, Prediabetes, and NSTEMI in June 2022 who presents to ED with shortness of breath. CT angio chest positive for acute PE. Pharmacy consulted for management of heparin drip in the setting of pulmonary embolism.   Date Time HL Rate/Comment 5/28 0915 0.30 1250 units/h 5/28 1842 0.79 SUPRAtherapeutic 5/29 0716 0.76 Supra- decrease drip from 1300 u/hr to 1200 u/hr 5/29 1653 0.44 thera 5/30 0056 0.44 therapeutic  Goal of Therapy:  Heparin level 0.3-0.7 units/ml Monitor platelets by anticoagulation protocol: Yes   Plan:    5/30 0056 HL=0.44 therapeutic Will continue Heparin drip at 1200 units/hr  F/u Heparin level  with am labs Continue to monitor H&H and platelets daily while on heparin gtt.   [name] A 01/31/2022,7:23 AM

## 2022-01-31 NOTE — Plan of Care (Signed)
  Problem: Health Behavior/Discharge Planning: Goal: Ability to manage health-related needs will improve Outcome: Progressing   Problem: Clinical Measurements: Goal: Will remain free from infection Outcome: Progressing   Problem: Clinical Measurements: Goal: Respiratory complications will improve Outcome: Progressing   Problem: Clinical Measurements: Goal: Cardiovascular complication will be avoided Outcome: Progressing   Problem: Activity: Goal: Risk for activity intolerance will decrease Outcome: Progressing   Problem: Elimination: Goal: Will not experience complications related to urinary retention Outcome: Progressing   Problem: Pain Managment: Goal: General experience of comfort will improve Outcome: Progressing   Problem: Safety: Goal: Ability to remain free from injury will improve Outcome: Progressing

## 2022-01-31 NOTE — TOC Initial Note (Signed)
Transition of Care [facility]) - Initial/Assessment Note    Patient Details  Name: Tina Fields MRN: [id number] Date of Birth: 1945-09-27  [facility] of [facility]) CM/SW Contact:    Laurena Slimmer, RN Phone Number: 01/31/2022, 10:32 AM  Clinical Narrative:                  Transition of Care [facility]) Screening Note   Patient Details  Name: Tina Fields Date of Birth: 08-31-1946   Transition of Care [facility]) CM/SW Contact:    Laurena Slimmer, RN Phone Number: 01/31/2022, 10:32 AM    Transition of Care Department (TOC) has reviewed patient and no TOC needs have been identified at this time. We will continue to monitor patient advancement through interdisciplinary progression rounds. If new patient transition needs arise, please place a TOC consult.          Patient Goals and CMS Choice        Expected Discharge Plan and Services                                                Prior Living Arrangements/Services                       Activities of Daily Living Home Assistive Devices/Equipment: Gilford Rile (specify type) ADL Screening (condition at time of admission) Patient's cognitive ability adequate to safely complete daily activities?: Yes Is the patient deaf or have difficulty hearing?: No Does the patient have difficulty seeing, even when wearing glasses/contacts?: No Does the patient have difficulty concentrating, remembering, or making decisions?: No Patient able to express need for assistance with ADLs?: Yes Does the patient have difficulty dressing or bathing?: No Independently performs ADLs?: Yes (appropriate for developmental age) Does the patient have difficulty walking or climbing stairs?: No Weakness of Legs: None Weakness of Arms/Hands: None  Permission Sought/Granted                  Emotional Assessment              Admission diagnosis:  SOB (shortness of breath) [R06.02] Atypical pneumonia [J18.9] Other  acute pulmonary embolism, unspecified whether acute cor pulmonale present ([place]) [I26.99] Patient Active Problem List   Diagnosis Date Noted   Abnormal urinalysis 01/29/2022   AKI (acute kidney injury) ([place]) - mild 01/29/2022   Acute pulmonary embolism ([place]): subnassive right PE, PESI score 118 (high risk) 01/28/2022   Acute respiratory failure with hypoxia ([place]) 01/28/2022   Hypomagnesemia 01/28/2022   Hypokalemia 01/28/2022   CAP (community acquired pneumonia) [id number]   Acute diastolic CHF (congestive heart failure) ([place]) 12/02/2021   Coronary artery disease involving native coronary artery of native heart without angina pectoris 03/15/2021   Renal mass 03/15/2021   Elevated troponin 03/03/2021   NSTEMI (non-ST elevated myocardial infarction) ([place]) 03/02/2021   Acute on chronic combined systolic and diastolic CHF (congestive heart failure) ([place]) 01/02/2021   HFrEF (heart failure with reduced ejection fraction) ([place]) 10/28/2020   OSA (obstructive sleep apnea) 10/28/2020   Vertigo 01/14/2020   Encounter for long-term (current) use of high-risk medication 02/18/2019   Mixed hyperlipidemia 12/03/2018   Prediabetes 09/03/2018   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   HTN (hypertension) 08/21/2017   GERD (gastroesophageal reflux disease) 08/21/2017   Nodule of right lung 05/21/2014   Rheumatoid arthritis, unspecified ([place])  06/09/2011   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

## 2022-01-31 NOTE — Progress Notes (Signed)
PROGRESS NOTE    Tina Fields  [id number] DOB: [birth year]  DOA: 01/28/2022 Date of Service: 01/31/22 PCP: Letta Median, MD     Brief Narrative / Hospital Course:  To ED 01/28/22 from home c/o 3 days SOB w/ productive cough, vomiting, fatigue. Chronic DOE but worsening. No O2 at home. Per EMS SpO2 80% RA. CTA in ED (+) R PE w/ R heart strain. Vascula recommended heparin gtt. Electrolyte derangements hypoK, hypoMg, elevated BNP. Started on Rocephin and Doxy for CAP.  05/28: patient was exhibiting respiratory distress this morning but improved on BiPap, ABG ordered 01/29/22 9:34 AM. Rales on exam, no LE edema, Echo pending --> lasix IV 40 mg x1 ordered. Reassessed later in afternoon, breathing easier still on BiPap, K improved, UA (+)concern for UTI pt already on rocephin awaiting UCx 05/29: off BiPap and stable on [place]. Alert, very pleasant and talkative, reports she is feeling much better. Echo EF 45-50, mild LVH, mild decreased wall motion c/w Grade I diastolic dysfunction, RV also mildly reduced systolic function, mod elevated PA pressure, density distal to PV artifact vs thrombus. No additional studies needed 05/30: doing well on 4L O2 this morning. If down to 2L will d/c heparin gtt and transition to po anticoags, pharmacy has checked and eliquis or xarelto should be affordable.   Consultants:  PCCU  Procedures: none    Subjective: Patient is alert, very pleasant and talkative, speaking in full sentences, feeling much better and asks about going home soon.     ASSESSMENT & PLAN:   Principal Problem:   Acute respiratory failure with hypoxia (HCC) Active Problems:   Acute pulmonary embolism ([place]): subnassive right PE, PESI score 118 (high risk)   HTN (hypertension)   Elevated troponin   COPD with asthma (HCC)   Coronary artery disease involving native coronary artery of native heart without angina pectoris   HFrEF (heart failure with reduced ejection  fraction) (HCC)   Hypomagnesemia   Hypokalemia   CAP (community acquired pneumonia)   Abnormal urinalysis   AKI (acute kidney injury) ([place]) - mild    Acute pulmonary embolism ([place]): subnassive right PE, PESI score 118 (high risk) acute submassive right-sided pulmonary embolism with right heart strain BiPap --> 4L [place] O2  Heparin drip (Dr Trula Slade was contacted by ED, not needing thrombolytic therapy) may be able to transition to po anticoagulant tomorrow  Echo EF 45-50, mild LVH, mild decreased wall motion c/w Grade I diastolic dysfunction, RV also mildly reduced systolic function, mod elevated PA pressure, density distal to PV artifact vs thrombus - messaged cardiology (Dr Saralyn Pilar) who agrees nothing else needing to be done for this finding  No DVT on BLE [place] so would not need IVC  Consider hypercoagulable panel (Factor V leiden, lupus anticoagulant, homocysteine, antithrombin III, protein C, protein S)   HTN (hypertension) 119/56 on admission --> 155/88 now Held Imdur/entresto/lasix due to low blood pressure. Restart med's once med rec done and if BP allows to prevent hypotension and / or syncope --> restarted entresto now Fall precaution.  Elevated troponin Mild troponin elevation, remained flat cont heparin gtt for PE    COPD with asthma ([place]) Stable,. cont home regimen albuterol.    Coronary artery disease involving native coronary artery of native heart without angina pectoris Cont statin and hold Imdur, entresto, and   lasix.   Acute respiratory failure with hypoxia (HCC) Pt coming with acute hypoxic respiratory failure due to PE. 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  PCCU following, patient was exhibiting respiratory distress on morning 05/28 but improved on BiPap, off BiPap as of 05/29 and doing well, transferred to progressive unit   Ensure adequate pulmonary hygiene  F/u cultures, trend PCT Continue CAP coverage: ceftriaxone & doxycycline bronchodilators PRN Today 01/31/22 on 4L O2 [place], will work on weaning this down as tolerated   Hypomagnesemia Replaced follow levels.   Hypokalemia Replace and follow levels. Repeat BMP pending this AM   CAP (community acquired pneumonia) With ct showing GG infiltrate and suspected infection  Tx w/ CAP regimen. (Today 01/30/22 is Day 3 of antibiotics)   HFrEF (heart failure with reduced ejection fraction) (HCC) Troponin: 20, flat.  BNP: 686 Continuous cardiac monitoring  Strict I/O's: alert provider if UOP < 0.5 mL/kg/hr Daily BMP, replace electrolytes PRN Daily weights to assess volume status Diurese with the use of IV lasix as tolerated Entresto restarted, will restart lasix if BP remains stable  Abnormal urinalysis Suspect UTI Culture pending Already on rocephin for CAP coverage   AKI (acute kidney injury) ([place]) - mild IV contrast exposure Baseline Cr: 0.88, Cr on admission: 1.02 Daily BMP, replace electrolytes PRN Avoid nephrotoxic agents as able  closely follow BMP to ensure adequate renal perfusion        DVT prophylaxis: on heparin gtt Code Status: FULL Family Communication: pt requests call to sin, will reach out to him later today.   Disposition Plan / TOC needs: remains inpatient appropriate, SDU --> PCU today.  Barriers to discharge / significant pending items: O2 support, IV meds, risk decompensation              Objective: Vitals:   01/31/22 0334 01/31/22 0735 01/31/22 1201 01/31/22 1458  BP: 128/78 140/79 121/73 122/68  Pulse:  77 72 73  Resp: 20 17 (!) 22 17  Temp: 97.6 F (36.4 C) 97.9 F (36.6 C) 98.5 F (36.9 C) 98.2 F (36.8 C)  TempSrc: Axillary     SpO2: 97% 97% 96% 95%  Weight:      Height:        Intake/Output Summary (Last 24 hours) at 01/31/2022 1521 Last data filed at 01/31/2022 1459 Gross per 24 hour   Intake 1500.02 ml  Output 1000 ml  Net 500.02 ml   Filed Weights   01/28/22 1803  Weight: 75.3 kg    Examination:  Constitutional:  VS as above General Appearance: alert, no distress Eyes: Normal lids and conjunctive, non-icteric sclera Ears, Nose, Mouth, Throat: Normal appearance Neck: No masses, trachea midline Respiratory: Increased respiratory effort Breath sounds improved, clear, mild rales at bases  Cardiovascular: S1/S2 normal, no murmur/rub/gallop auscultated, no appreciable S4 No JVD No lower extremity edema Gastrointestinal: Nontender Musculoskeletal:  No clubbing/cyanosis of digits Neurological: No cranial nerve deficit on limited exa        Scheduled Medications:   atorvastatin  40 mg Oral q morning   Chlorhexidine Gluconate Cloth  6 each Topical Daily   methylPREDNISolone (SOLU-MEDROL) injection  120 mg Intravenous Q12H   montelukast  10 mg Oral QHS   potassium chloride  40 mEq Oral Once    Continuous Infusions:  cefTRIAXone (ROCEPHIN)  IV Stopped (01/30/22 2333)   doxycycline (VIBRAMYCIN) IV 100 mg (01/31/22 0942)   heparin 1,200 Units/hr (01/31/22 1436)    PRN Medications:  albuterol, calcium carbonate, fluticasone, HYDROmorphone (DILAUDID) injection, ibuprofen, polyethylene glycol  Antimicrobials:  Anti-infectives (From  admission, onward)    Start     Dose/Rate Route Frequency Ordered Stop   01/29/22 2200  cefTRIAXone (ROCEPHIN) 2 g in sodium chloride 0.9 % 100 mL IVPB        2 g 200 mL/hr over 30 Minutes Intravenous Every 24 hours 01/28/22 2230 02/02/22 2159   01/28/22 2200  cefTRIAXone (ROCEPHIN) 2 g in sodium chloride 0.9 % 100 mL IVPB  Status:  Discontinued        2 g 200 mL/hr over 30 Minutes Intravenous Every 24 hours 01/28/22 2152 01/28/22 2230   01/28/22 2200  doxycycline (VIBRAMYCIN) 100 mg in sodium chloride 0.9 % 250 mL IVPB        100 mg 125 mL/hr over 120 Minutes Intravenous Every 12 hours 01/28/22 2152     01/28/22  2130  cefTRIAXone (ROCEPHIN) 2 g in sodium chloride 0.9 % 100 mL IVPB        2 g 200 mL/hr over 30 Minutes Intravenous  Once 01/28/22 2117 01/28/22 2306   01/28/22 2130  doxycycline (VIBRAMYCIN) 100 mg in sodium chloride 0.9 % 250 mL IVPB  Status:  Discontinued        100 mg 125 mL/hr over 120 Minutes Intravenous  Once 01/28/22 2117 01/28/22 2157       Data Reviewed: I have personally reviewed following labs and imaging studies  CBC: Recent Labs  Lab 01/28/22 1814 01/29/22 0236 01/30/22 0344 01/31/22 0607  WBC 7.2 7.9 5.0 11.7*  NEUTROABS 5.7  --   --   --   HGB 12.1 12.1 11.0* 11.7*  HCT 37.2 36.9 33.9* 35.8*  MCV 95.4 94.4 95.2 93.2  PLT 183 169 184 [id number]   Basic Metabolic Panel: Recent Labs  Lab 01/28/22 1814 01/29/22 0236 01/29/22 0915 01/30/22 0344 01/30/22 1653 01/31/22 0607  NA 141 138 135 141 137  --   K 2.5* 2.7*  2.7* 3.6 3.7 3.4*  --   CL 104 105 106 109 106  --   CO2 27 24 22 22  21*  --   GLUCOSE 131* 123* 118* 132* 216*  --   BUN 17 14 14  29* 46*  --   CREATININE 1.02* 0.90 0.77 0.86 1.20*  --   CALCIUM 7.5* 7.4* 7.0* 7.6* 8.0*  --   MG 1.5* 2.0  --  2.0  --  2.2  PHOS  --   --   --  3.6  --  2.5   GFR: Estimated Creatinine Clearance: 40.1 mL/min (A) (by C-G formula based on SCr of 1.2 mg/dL (H)). Liver Function Tests: Recent Labs  Lab 01/28/22 1814 01/29/22 0236  AST 21 22  ALT 14 15  ALKPHOS 94 99  BILITOT 2.9* 2.7*  PROT 6.6 6.6  ALBUMIN 2.7* 2.7*   No results for input(s): LIPASE, AMYLASE in the last 168 hours. No results for input(s): AMMONIA in the last 168 hours. Coagulation Profile: Recent Labs  Lab 01/28/22 1814  INR 1.2   Cardiac Enzymes: No results for input(s): CKTOTAL, CKMB, CKMBINDEX, TROPONINI in the last 168 hours. BNP (last 3 results) No results for input(s): PROBNP in the last 8760 hours. HbA1C: No results for input(s): HGBA1C in the last 72 hours. CBG: Recent Labs  Lab 01/29/22 0023 01/29/22 0746  01/29/22 1135 01/29/22 1609 01/29/22 2352  GLUCAP 132* 102* 136* 148* 146*   Lipid Profile: No results for input(s): CHOL, HDL, LDLCALC, TRIG, CHOLHDL, LDLDIRECT in the last 72 hours. Thyroid Function Tests: No results for input(s):  TSH, T4TOTAL, FREET4, T3FREE, THYROIDAB in the last 72 hours. Anemia Panel: No results for input(s): VITAMINB12, FOLATE, FERRITIN, TIBC, IRON, RETICCTPCT in the last 72 hours. Urine analysis:    Component Value Date/Time   COLORURINE AMBER (A) 01/29/2022 0800   APPEARANCEUR CLOUDY (A) 01/29/2022 0800   APPEARANCEUR Clear 01/16/2012 0248   LABSPEC 1.040 (H) 01/29/2022 0800   LABSPEC 1.009 01/16/2012 0248   PHURINE 5.0 01/29/2022 0800   GLUCOSEU NEGATIVE 01/29/2022 0800   GLUCOSEU Negative 01/16/2012 0248   HGBUR SMALL (A) 01/29/2022 0800   BILIRUBINUR SMALL (A) 01/29/2022 0800   BILIRUBINUR Negative 01/16/2012 0248   KETONESUR 5 (A) 01/29/2022 0800   PROTEINUR 100 (A) 01/29/2022 0800   NITRITE NEGATIVE 01/29/2022 0800   LEUKOCYTESUR LARGE (A) 01/29/2022 0800   LEUKOCYTESUR Negative 01/16/2012 0248   Sepsis Labs: @LABRCNTIP (procalcitonin:4,lacticidven:4)  Recent Results (from the past 240 hour(s))  Urine Culture     Status: Abnormal   Collection Time: 01/28/22  6:02 PM   Specimen: In/Out Cath Urine  Result Value Ref Range Status   Specimen Description   Final    IN/OUT CATH URINE Performed at [facility], [address], [place], [place] [address]    Special Requests   Final    NONE Performed at [facility], [address] [place], [place] [address]    Culture (A)  Final    <10,000 COLONIES/mL MULTIPLE SPECIES PRESENT, SUGGEST RECOLLECTION   Report Status 01/30/2022 FINAL  Final  Resp Panel by RT-PCR (Flu A&B, Covid) Nasopharyngeal Swab     Status: None   Collection Time: 01/28/22  6:14 PM   Specimen: Nasopharyngeal Swab; Nasal Swab  Result Value Ref Range Status   SARS Coronavirus 2 by RT PCR NEGATIVE NEGATIVE  Final    Comment: (NOTE) SARS-CoV-2 target nucleic acids are NOT DETECTED.  The SARS-CoV-2 RNA is generally detectable in upper respiratory specimens during the acute phase of infection. The lowest concentration of SARS-CoV-2 viral copies this assay can detect is 138 copies/mL. A negative result does not preclude SARS-Cov-2 infection and should not be used as the sole basis for treatment or other patient management decisions. A negative result may occur with  improper specimen collection/handling, submission of specimen other than nasopharyngeal swab, presence of viral mutation(s) within the areas targeted by this assay, and inadequate number of viral copies(<138 copies/mL). A negative result must be combined with clinical observations, patient history, and epidemiological information. The expected result is Negative.  Fact Sheet for Patients:  EntrepreneurPulse.com.au  Fact Sheet for Healthcare Providers:  IncredibleEmployment.be  This test is no t yet approved or cleared by the [place] FDA and  has been authorized for detection and/or diagnosis of SARS-CoV-2 by FDA under an Emergency Use Authorization (EUA). This EUA will remain  in effect (meaning this test can be used) for the duration of the COVID-19 declaration under Section 564(b)(1) of the Act, 21 U.S.C.section 360bbb-3(b)(1), unless the authorization is terminated  or revoked sooner.       Influenza A by PCR NEGATIVE NEGATIVE Final   Influenza B by PCR NEGATIVE NEGATIVE Final    Comment: (NOTE) The Xpert Xpress SARS-CoV-2/FLU/RSV plus assay is intended as an aid in the diagnosis of influenza from Nasopharyngeal swab specimens and should not be used as a sole basis for treatment. Nasal washings and aspirates are unacceptable for Xpert Xpress SARS-CoV-2/FLU/RSV testing.  Fact Sheet for Patients: EntrepreneurPulse.com.au  Fact Sheet for Healthcare  Providers: IncredibleEmployment.be  This test is not  yet approved or cleared by the [place] and has been authorized for detection and/or diagnosis of SARS-CoV-2 by FDA under an Emergency Use Authorization (EUA). This EUA will remain in effect (meaning this test can be used) for the duration of the COVID-19 declaration under Section 564(b)(1) of the Act, 21 U.S.C. section 360bbb-3(b)(1), unless the authorization is terminated or revoked.  Performed at [facility], [address] [place], [place] [address]   Blood Culture (routine x 2)     Status: None (Preliminary result)   Collection Time: 01/28/22  6:14 PM   Specimen: BLOOD  Result Value Ref Range Status   Specimen Description BLOOD BLOOD LEFT HAND  Final   Special Requests   Final    BOTTLES DRAWN AEROBIC AND ANAEROBIC Blood Culture adequate volume   Culture   Final    NO GROWTH 3 DAYS Performed at [facility], [address] [place], [place] [address]    Report Status PENDING  Incomplete  Blood Culture (routine x 2)     Status: None (Preliminary result)   Collection Time: 01/28/22  6:15 PM   Specimen: BLOOD  Result Value Ref Range Status   Specimen Description BLOOD RIGHT ANTECUBITAL  Final   Special Requests   Final    BOTTLES DRAWN AEROBIC AND ANAEROBIC Blood Culture adequate volume   Culture   Final    NO GROWTH 3 DAYS Performed at [facility], [address] [place], [place] [address]    Report Status PENDING  Incomplete  MRSA Next Gen by PCR, Nasal     Status: None   Collection Time: 01/29/22  1:00 AM   Specimen: Nasal Mucosa; Nasal Swab  Result Value Ref Range Status   MRSA by PCR Next Gen NOT DETECTED NOT DETECTED Final    Comment: (NOTE) The GeneXpert MRSA Assay (FDA approved for NASAL specimens only), is one component of a comprehensive MRSA colonization surveillance program. It is not intended to diagnose MRSA infection nor to guide or monitor  treatment for MRSA infections. Test performance is not FDA approved in patients less than 30 years old. Performed at [facility], [address] [place], [place] [address]   Expectorated Sputum Assessment w Gram Stain, Rflx to Resp Cult     Status: None   Collection Time: 01/29/22  4:47 AM   Specimen: Expectorated Sputum  Result Value Ref Range Status   Specimen Description EXPECTORATED SPUTUM  Final   Special Requests NONE  Final   Sputum evaluation   Final    Sputum specimen not acceptable for testing.  Please recollect.   Performed at [facility], [address] [place], [place] [address]    Report Status 01/29/2022 FINAL  Final  Expectorated Sputum Assessment w Gram Stain, Rflx to Resp Cult     Status: None   Collection Time: 01/29/22  6:39 AM   Specimen: Sputum  Result Value Ref Range Status   Specimen Description SPU  Final   Special Requests SPU  Final   Sputum evaluation   Final    Sputum specimen not acceptable for testing.  Please recollect.   Performed at [facility], [address], [place], [place] [address]    Report Status 01/29/2022 FINAL  Final  Respiratory (~20 pathogens) panel by PCR     Status: None   Collection Time: 01/30/22 11:43 AM   Specimen: Nasopharyngeal Swab; Respiratory  Result Value Ref Range Status   Adenovirus NOT DETECTED  NOT DETECTED Final   Coronavirus 229E NOT DETECTED NOT DETECTED Final    Comment: (NOTE) The Coronavirus on the Respiratory Panel, DOES NOT test for the novel  Coronavirus (2019 nCoV)    Coronavirus HKU1 NOT DETECTED NOT DETECTED Final   Coronavirus NL63 NOT DETECTED NOT DETECTED Final   Coronavirus OC43 NOT DETECTED NOT DETECTED Final   Metapneumovirus NOT DETECTED NOT DETECTED Final   Rhinovirus / Enterovirus NOT DETECTED NOT DETECTED Final   Influenza A NOT DETECTED NOT DETECTED Final   Influenza B NOT DETECTED NOT DETECTED Final   Parainfluenza Virus 1 NOT DETECTED NOT DETECTED Final    Parainfluenza Virus 2 NOT DETECTED NOT DETECTED Final   Parainfluenza Virus 3 NOT DETECTED NOT DETECTED Final   Parainfluenza Virus 4 NOT DETECTED NOT DETECTED Final   Respiratory Syncytial Virus NOT DETECTED NOT DETECTED Final   Bordetella pertussis NOT DETECTED NOT DETECTED Final   Bordetella Parapertussis NOT DETECTED NOT DETECTED Final   Chlamydophila pneumoniae NOT DETECTED NOT DETECTED Final   Mycoplasma pneumoniae NOT DETECTED NOT DETECTED Final    Comment: Performed at [facility], [place] [address] [place], [place] [address]         Radiology Studies last 96 hours: CT Angio Chest PE W and/or Wo Contrast  Addendum Date: 01/28/2022   ADDENDUM REPORT: 01/28/2022 20:55 ADDENDUM: Critical Value/emergent results were called by telephone at the time of interpretation on 01/28/2022 at 8:48 pm to provider MARK QUALE , who verbally acknowledged these results. Electronically Signed   By: Claudie Revering M.D.   On: 01/28/2022 20:55   Result Date: 01/28/2022 CLINICAL DATA:  Shortness of breath for 1 day.  Hypoxia. EXAM: CT ANGIOGRAPHY CHEST WITH CONTRAST TECHNIQUE: Multidetector CT imaging of the chest was performed using the standard protocol during bolus administration of intravenous contrast. Multiplanar CT image reconstructions and MIPs were obtained to evaluate the vascular anatomy. RADIATION DOSE REDUCTION: This exam was performed according to the departmental dose-optimization program which includes automated exposure control, adjustment of the mA and/or kV according to patient size and/or use of iterative reconstruction technique. CONTRAST:  52mL OMNIPAQUE IOHEXOL 350 MG/ML SOLN COMPARISON:  Portable chest obtained earlier today. Chest CT dated 11/25/2010. Abdomen and pelvis CT dated 03/04/2021 and limited right upper quadrant abdomen ultrasound dated 03/04/2021. FINDINGS: Cardiovascular: Enlarged heart. Atheromatous calcifications, including the coronary arteries and aorta. Minimal  pericardial effusion. Multiple moderate-sized pulmonary emboli on the right. The right ventricular to left ventricular ratio is 1.06. Mediastinum/Nodes: Interval multiple mildly enlarged mediastinal and bilateral hilar lymph nodes. Mildly prominent left axillary lymph nodes without significant change. Small to moderate-sized hiatal hernia. Normal appearing thyroid gland. Lungs/Pleura: Interval mosaic pattern of ground-glass opacity throughout both lungs. Mild peripheral patchy consolidation in both upper lobes. Bilateral paraseptal bullous changes. No pleural fluid. Upper Abdomen: Previously demonstrated multiple liver cysts. Partially included previously demonstrated bilateral upper pole cysts, including a large cyst indenting into the right lobe of the liver. Musculoskeletal: Thoracic and lower cervical spine degenerative changes. Review of the MIP images confirms the above findings. IMPRESSION: 1. Positive for acute PE with CTevidence of right heart strain (RV/LV Ratio = 1.06) consistent with at least submassive (intermediate risk) PE. The presence of right heart strain has been associated with an increased risk of morbidity and mortality. 2. Extensive bilateral was a ground-glass opacities with underlying changes of COPD. In the presence of interval mediastinal and bilateral hilar adenopathy, this is most likely infectious in origin with associated reactive adenopathy.  3. Cardiomegaly. 4. Previously demonstrated liver and bilateral renal cysts. 5.  Calcific coronary artery and aortic atherosclerosis. 6. Small to moderate-sized hiatal hernia. Aortic Atherosclerosis (ICD10-I70.0) and Emphysema (ICD10-J43.9). Electronically Signed: By: Claudie Revering M.D. On: 01/28/2022 20:46   US Venous Img Lower Bilateral (DVT)  Result Date: 01/29/2022 CLINICAL DATA:  Pulmonary embolism EXAM: BILATERAL LOWER EXTREMITY VENOUS DOPPLER ULTRASOUND TECHNIQUE: Gray-scale sonography with compression, as well as color and duplex  ultrasound, were performed to evaluate the deep venous system(s) from the level of the common femoral vein through the popliteal and proximal calf veins. COMPARISON:  03/02/2021 FINDINGS: VENOUS Normal compressibility of the common femoral, superficial femoral, and popliteal veins, as well as the visualized calf veins. Visualized portions of profunda femoral vein and great saphenous vein unremarkable. No filling defects to suggest DVT on grayscale or color Doppler imaging. Doppler waveforms show normal direction of venous flow, normal respiratory plasticity and response to augmentation. OTHER None. Limitations: none IMPRESSION: No lower extremity DVT Electronically Signed   By: Miachel Roux M.D.   On: 01/29/2022 06:09   DG Chest Port 1 View  Result Date: 01/28/2022 CLINICAL DATA:  Questionable sepsis. EXAM: PORTABLE CHEST 1 VIEW COMPARISON:  AP chest 12/02/2021, CT chest 11/25/2010, chest two views 09/27/2021 FINDINGS: Cardiac silhouette is again mildly enlarged. Moderate calcification is again seen within the aortic arch. Chronic moderate bilateral interstitial thickening is similar to 12/02/2021 and 09/27/2021. This is again greatest within the lung bases. No definite pleural effusion. No pneumothorax. No acute skeletal abnormality. IMPRESSION: Moderate bilateral interstitial thickening is unchanged from multiple prior radiographs, likely chronic interstitial lung disease. No definite pneumonia. Electronically Signed   By: Yvonne Kendall M.D.   On: 01/28/2022 18:25   ECHOCARDIOGRAM COMPLETE  Result Date: 01/29/2022    ECHOCARDIOGRAM REPORT   Patient Name:   NERY MUZNY Sizemore Date of Exam: 01/29/2022 Medical Rec #:  [id number]             Height:       66.0 in Accession #:    [id number]            Weight:       166.0 lb Date of Birth:  November 25, 1945              BSA:          1.848 m Patient Age:    69 years              BP:           149/74 mmHg Patient Gender: F                     HR:           106 bpm.  Exam Location:  [facility] Procedure: 2D Echo Indications:     Pulmonary Embolus I26.09  History:         Patient has prior history of Echocardiogram examinations, most                  recent 03/03/2021.  Sonographer:     Kathlen Brunswick RDCS Referring Phys:  [id number] V PATEL Diagnosing Phys: [place]  1. Left ventricular ejection fraction, by estimation, is 45 to 50%. Left ventricular ejection fraction by 2D MOD biplane is 48.1 %. The left ventricle has mildly decreased function. The left ventricle demonstrates regional wall motion abnormalities (see  scoring diagram/findings for description). There is mild left ventricular hypertrophy. Left ventricular diastolic parameters  are consistent with Grade I diastolic dysfunction (impaired relaxation).  2. Right ventricular systolic function is mildly reduced. The right ventricular size is mildly enlarged. There is moderately elevated pulmonary artery systolic pressure.  3. The mitral valve is normal in structure. Mild mitral valve regurgitation.  4. The aortic valve is normal in structure. Aortic valve regurgitation is not visualized. No aortic stenosis is present.  5. Echo density distal to PV. Artifact versus possibly thrombus in setting of PE. Comparison(s): Compared to echo from [facility] in 2022, RV pressures are higher. Per report, EF sounds to be similar, with prior WMA present. FINDINGS  Left Ventricle: Left ventricular ejection fraction, by estimation, is 45 to 50%. Left ventricular ejection fraction by 2D MOD biplane is 48.1 %. The left ventricle has mildly decreased function. The left ventricle demonstrates regional wall motion abnormalities. Mild hypokinesis of the left ventricular, mid-apical anterolateral wall. The left ventricular internal cavity size was normal in size. There is mild left ventricular hypertrophy. Left ventricular diastolic parameters are consistent with Grade I diastolic dysfunction (impaired relaxation). Right Ventricle: The right  ventricular size is mildly enlarged. Right vetricular wall thickness was not well visualized. Right ventricular systolic function is mildly reduced. There is moderately elevated pulmonary artery systolic pressure. The tricuspid  regurgitant velocity is 3.66 m/s, and with an assumed right atrial pressure of 5 mmHg, the estimated right ventricular systolic pressure is 0000000 mmHg. Left Atrium: Left atrial size was normal in size. Right Atrium: Right atrial size was normal in size. Pericardium: There is no evidence of pericardial effusion. Mitral Valve: The mitral valve is normal in structure. Mild mitral valve regurgitation. Tricuspid Valve: The tricuspid valve is normal in structure. Tricuspid valve regurgitation is mild. Aortic Valve: The aortic valve is normal in structure. Aortic valve regurgitation is not visualized. No aortic stenosis is present. Aortic valve peak gradient measures 7.3 mmHg. Pulmonic Valve: Echo density distal to PV. Artifact versus possibly thrombus in setting of PE. The pulmonic valve was not well visualized. Pulmonic valve regurgitation is trivial. No evidence of pulmonic stenosis. Aorta: The aortic root and ascending aorta are structurally normal, with no evidence of dilitation. Venous: The inferior vena cava was not well visualized. IAS/Shunts: The interatrial septum was not well visualized.  LEFT VENTRICLE PLAX 2D                        Biplane EF (MOD) LVIDd:         3.95 cm         LV Biplane EF:   Left LVIDs:         3.15 cm                          ventricular LV PW:         1.07 cm                          ejection LV IVS:        1.43 cm                          fraction by LVOT diam:     1.80 cm                          2D MOD LV SV:  27                               biplane is LV SV Index:   15                               48.1 %. LVOT Area:     2.54 cm                                Diastology                                LV e' medial:    5.11 cm/s LV Volumes (MOD)                LV E/e' medial:  17.2 LV vol d, MOD    85.9 ml       LV e' lateral:   5.22 cm/s A2C:                           LV E/e' lateral: 16.8 LV vol d, MOD    48.9 ml A4C: LV vol s, MOD    38.2 ml A2C: LV vol s, MOD    26.4 ml A4C: LV SV MOD A2C:   47.7 ml LV SV MOD A4C:   48.9 ml LV SV MOD BP:    32.6 ml RIGHT VENTRICLE RV Basal diam:  3.66 cm RV S prime:     10.70 cm/s TAPSE (M-mode): 1.6 cm LEFT ATRIUM             Index        RIGHT ATRIUM           Index LA diam:        4.50 cm 2.44 cm/m   RA Area:     16.60 cm LA Vol (A2C):   55.4 ml 29.99 ml/m  RA Volume:   44.40 ml  24.03 ml/m LA Vol (A4C):   45.4 ml 24.57 ml/m LA Biplane Vol: 54.8 ml 29.66 ml/m  AORTIC VALVE                 PULMONIC VALVE AV Area (Vmax): 1.54 cm     PV Vmax:          0.94 m/s AV Vmax:        135.00 cm/s  PV Peak grad:     3.6 mmHg AV Peak Grad:   7.3 mmHg     PR End Diast Vel: 8.29 msec LVOT Vmax:      81.60 cm/s LVOT Vmean:     54.200 cm/s LVOT VTI:       0.108 m  AORTA Ao Root diam: 3.00 cm Ao Asc diam:  2.90 cm MITRAL VALVE               TRICUSPID VALVE MV Area (PHT): 4.74 cm    TV Peak grad:   36.7 mmHg MV Decel Time: 160 msec    TV Vmax:        3.03 m/s MV E velocity: 87.80 cm/s  TR Peak grad:   53.6 mmHg  TR Vmax:        366.00 cm/s                             SHUNTS                            Systemic VTI:  0.11 m                            Systemic Diam: 1.80 cm Donnelly Angelica Electronically signed by Donnelly Angelica Signature Date/Time: 01/29/2022/11:03:27 AM    Final             LOS: 3 days    Time spent: 49 minutes    Emeterio Reeve, DO [facility] 01/31/2022, 3:21 PM   Staff may message me via secure chat in [place]  but this may not receive immediate response,  please page for urgent matters!  If 7PM-7AM, please contact night-coverage [email or web address]  Dictation software was used to generate the above note. Typos may occur and escape review, as with typed/written notes. Please  contact Dr Sheppard Coil directly for clarity if needed.

## 2022-02-01 DIAGNOSIS — M069 Rheumatoid arthritis, unspecified: Secondary | ICD-10-CM

## 2022-02-01 DIAGNOSIS — R5381 Other malaise: Secondary | ICD-10-CM

## 2022-02-01 DIAGNOSIS — R8281 Pyuria: Secondary | ICD-10-CM

## 2022-02-01 DIAGNOSIS — I2609 Other pulmonary embolism with acute cor pulmonale: Secondary | ICD-10-CM

## 2022-02-01 DIAGNOSIS — I959 Hypotension, unspecified: Secondary | ICD-10-CM

## 2022-02-01 DIAGNOSIS — I2510 Atherosclerotic heart disease of native coronary artery without angina pectoris: Secondary | ICD-10-CM

## 2022-02-01 DIAGNOSIS — I5043 Acute on chronic combined systolic (congestive) and diastolic (congestive) heart failure: Secondary | ICD-10-CM

## 2022-02-01 DIAGNOSIS — J189 Pneumonia, unspecified organism: Secondary | ICD-10-CM

## 2022-02-01 DIAGNOSIS — J449 Chronic obstructive pulmonary disease, unspecified: Secondary | ICD-10-CM

## 2022-02-01 DIAGNOSIS — N179 Acute kidney failure, unspecified: Secondary | ICD-10-CM

## 2022-02-01 DIAGNOSIS — R778 Other specified abnormalities of plasma proteins: Secondary | ICD-10-CM

## 2022-02-01 DIAGNOSIS — E876 Hypokalemia: Secondary | ICD-10-CM

## 2022-02-01 LAB — CBC
HCT: 36.2 % (ref 36.0–46.0)
Hemoglobin: 11.6 g/dL — ABNORMAL LOW (ref 12.0–15.0)
MCH: 30.8 pg (ref 26.0–34.0)
MCHC: 32 g/dL (ref 30.0–36.0)
MCV: 96 fL (ref 80.0–100.0)
Platelets: 256 10*3/uL (ref 150–400)
RBC: 3.77 MIL/uL — ABNORMAL LOW (ref 3.87–5.11)
RDW: 15.1 % (ref 11.5–15.5)
WBC: 9 10*3/uL (ref 4.0–10.5)
nRBC: 0 % (ref 0.0–0.2)

## 2022-02-01 LAB — BASIC METABOLIC PANEL
Anion gap: 6 (ref 5–15)
BUN: 39 mg/dL — ABNORMAL HIGH (ref 8–23)
CO2: 21 mmol/L — ABNORMAL LOW (ref 22–32)
Calcium: 8.1 mg/dL — ABNORMAL LOW (ref 8.9–10.3)
Chloride: 111 mmol/L (ref 98–111)
Creatinine, Ser: 0.95 mg/dL (ref 0.44–1.00)
GFR, Estimated: >60 mL/min (ref >60)
Glucose, Bld: 191 mg/dL — ABNORMAL HIGH (ref 70–99)
Potassium: 4.5 mmol/L (ref 3.5–5.1)
Sodium: 138 mmol/L (ref 135–145)

## 2022-02-01 LAB — MAGNESIUM: Magnesium: 2.1 mg/dL (ref 1.7–2.4)

## 2022-02-01 LAB — PHOSPHORUS: Phosphorus: 2.2 mg/dL — ABNORMAL LOW (ref 2.5–4.6)

## 2022-02-01 MED ORDER — APIXABAN 5 MG PO TABS: 5.0000 mg | ORAL_TABLET | Freq: Two times a day (BID) | ORAL | 1 refills | Status: DC

## 2022-02-01 MED ORDER — APIXABAN (ELIQUIS) VTE STARTER PACK (10MG AND 5MG): ORAL_TABLET | ORAL | 0 refills | Status: DC

## 2022-02-01 MED ORDER — DOXYCYCLINE HYCLATE 100 MG PO TABS
100.0000 mg | ORAL_TABLET | Freq: Two times a day (BID) | ORAL | Status: DC
  Administered 2022-02-01: 100 mg via ORAL
  Filled 2022-02-01: qty 1

## 2022-02-01 MED ORDER — ENTRESTO 24-26 MG PO TABS: 1.0000 | ORAL_TABLET | Freq: Every day | ORAL | 0 refills | Status: AC

## 2022-02-01 NOTE — Hospital Course (Addendum)
76 year old F with PMH of systolic CHF, COPD/asthma, CAD, rheumatoid arthritis and DOE presenting with productive cough, worsening DOE, fatigue and nausea and admitted for acute respiratory failure with hypoxia due to right lung PE with right heart strain on CT and echocardiogram, community-acquired pneumonia, and electrolyte derangements.  Vascular surgery consulted and she was started on IV heparin for PE.  Venous Doppler did not show DVT in lower extremities.  She was also started on ceftriaxone and doxycycline for pneumonia.  Attempt was made to hydrate with IV fluid but she developed respiratory distress.  IV fluid discontinued and she was given IV Lasix with improvement in her breathing.  Echo EF 45-50, mild LVH, mild decreased wall motion c/w Grade I diastolic dysfunction, RV also mildly reduced systolic function, mod elevated PA pressure, density distal to PV artifact vs thrombus. No additional studies needed  On the day of discharge, patient felt well with significant improvement in her breathing.  AKI and electrolyte derangement resolved.  She completed 4 days of IV ceftriaxone and doxycycline.  She is discharged on starter pack Eliquis.  She has been advised to avoid NSAID.  Cardiology to arrange outpatient follow-up. Also recommend outpatient follow-up with PCP in 1 to 2 weeks.  Home health PT/OT ordered as recommended by therapy.

## 2022-02-01 NOTE — Assessment & Plan Note (Signed)
Home health PT/OT ordered as recommended.

## 2022-02-01 NOTE — Assessment & Plan Note (Signed)
Likely due to fluid resuscitation for hypotension.  TTE with LVEF of 45 to 50%, RWMA, G1 DD, and moderately elevated PASP and dcho density distal to PV. Artifact versus possibly thrombus in  setting of PE.  Troponin: 20, flat.  BNP: 686.  Diuresed with IV Lasix with improvement in her respiratory status.  Discharged on home Lasix 20 mg daily and Imdur 30 mg daily.  Advised to hold Entresto for 3 more days.  Advised to avoid NSAID.  Counseled on sodium and fluid restriction.  Reassess fluid status, renal function and electrolytes, and adjust diuretics as appropriate

## 2022-02-01 NOTE — Discharge Summary (Signed)
Physician Discharge Summary  Tina Fields [id number] DOB: 04-06-1946 DOA: 01/28/2022  PCP: Letta Median, MD  Admit date: 01/28/2022 Discharge date: 02/01/2022 Admitted From: Home Disposition: Home Recommendations for Outpatient Follow-up:  Follow ups as below. Please obtain CBC/BMP/Mag at follow up Please follow up on the following pending results: None  Home Health: PT/OT Equipment/Devices: 2 L oxygen.  Patient has walker at home.  Discharge Condition: Stable CODE STATUS: Full code  Follow-up Information     Bender, Durene Cal, MD. Schedule an appointment as soon as possible for a visit in 1 week(s).   Specialty: Family Medicine Contact information: [place] [phone] [facility] 76 year old F with PMH of systolic CHF, COPD/asthma, CAD, rheumatoid arthritis and DOE presenting with productive cough, worsening DOE, fatigue and nausea and admitted for acute respiratory failure with hypoxia due to right lung PE with right heart strain on CT and echocardiogram, community-acquired pneumonia, and electrolyte derangements.  Vascular surgery consulted and she was started on IV heparin for PE.  Venous Doppler did not show DVT in lower extremities.  She was also started on ceftriaxone and doxycycline for pneumonia.  Attempt was made to hydrate with IV fluid but she developed respiratory distress.  IV fluid discontinued and she was given IV Lasix with improvement in her breathing.  Echo EF 45-50, mild LVH, mild decreased wall motion c/w Grade I diastolic dysfunction, RV also mildly reduced systolic function, mod elevated PA pressure, density distal to PV artifact vs thrombus. No additional studies needed  On the day of discharge, patient felt well with significant improvement in her breathing.  AKI and electrolyte derangement resolved.  She completed 4 days of IV ceftriaxone and doxycycline.  She is  discharged on starter pack Eliquis.  She has been advised to avoid NSAID.  Cardiology to arrange outpatient follow-up. Also recommend outpatient follow-up with PCP in 1 to 2 weeks.  Home health PT/OT ordered as recommended by therapy.   See individual problem list below for more on hospital course.  Problems addressed during this hospitalization Problem  Acute Pulmonary Embolism With Acute Cor Pulmonale (Hcc)  AKI (acute kidney injury) ([place]) - mild  Cap (Community Acquired Pneumonia)  Acute on chronic combined CHF  Acute Respiratory Failure With Hypoxia (Hcc)  Physical Deconditioning  Pyuria  CAD  Elevated Troponin  Hypotension  Copd With Asthma (Hcc)  Hypomagnesemia (Resolved)  Hypokalemia (Resolved)  Hfref (Heart Failure With Reduced Ejection Fraction) (Hcc) (Resolved)    Assessment and Plan: * Acute pulmonary embolism with acute cor pulmonale (HCC) CTA showed acute submassive right-sided PE with right heart strain.  She has elevated BNP with right heart strain on echo as well.  She was treated with IV heparin per vascular surgery recommendation, and transitioned starter pack Eliquis prior to discharge. -She is discharged on a starter pack Eliquis -Advised to avoid NSAID. -Consider hypercoagulable panel   AKI (acute kidney injury) ([place]) - mild Recent Labs    12/02/21 0435 12/03/21 0527 12/04/21 [id number] 12/05/21 0513 01/28/22 1814 01/29/22 0236 01/29/22 0915 01/30/22 0344 01/30/22 1653 02/01/22 0554  BUN 17 20 22  27* 17 14 14  29* 46* 39*  CREATININE 0.90 0.91 0.92 0.88 1.02* 0.90 0.77 0.86 1.20* 0.95  Resolved.  Advised against NSAID. -Recheck renal function at follow-up   CAP (community acquired pneumonia) With ct showing GG infiltrate and suspected infection.  Completed  4 days of ceftriaxone and doxycycline in house.   Acute on chronic combined CHF Likely due to fluid resuscitation for hypotension.  TTE with LVEF of 45 to 50%, RWMA, G1 DD, and moderately  elevated PASP and dcho density distal to PV. Artifact versus possibly thrombus in  setting of PE.  Troponin: 20, flat.  BNP: 686.  Diuresed with IV Lasix with improvement in her respiratory status.  Discharged on home Lasix 20 mg daily and Imdur 30 mg daily.  Advised to hold Entresto for 3 more days.  Advised to avoid NSAID.  Counseled on sodium and fluid restriction.  Reassess fluid status, renal function and electrolytes, and adjust diuretics as appropriate  Acute respiratory failure with hypoxia (HCC) Likely due to PE, CHF and pneumonia.  Improved but desaturated to 86% with ambulation on room air requiring 2 L to recover. Discharged on 2 L.   Physical deconditioning Home health PT/OT ordered as recommended.  Pyuria No UTI symptoms.  Urine culture with insignificant growth.  Received 4 days of ceftriaxone for pneumonia  CAD Stable.  Continue medications.  COPD with asthma ([place]) Stable.  Continue home medications.    Elevated troponin Likely demand ischemia.    Hypotension Likely due to PE and right heart strain.  Resolved.  Hypokalemia-resolved as of 02/01/2022 Resolved   Hypomagnesemia-resolved as of 02/01/2022 Resolved                 Vital signs Vitals:   02/01/22 1140 02/01/22 1345 02/01/22 1346 02/01/22 1634  BP: 124/69   (!) 141/95  Pulse: 83   75  Temp: 97.7 F (36.5 C)   (!) 97.2 F (36.2 C)  Resp: 18   16  Height:      Weight:      SpO2: 90% (!) 86% 92% 93%  TempSrc:    Axillary  BMI (Calculated):         Discharge exam  GENERAL: No apparent distress.  Nontoxic. HEENT: MMM.  Vision and hearing grossly intact.  NECK: Supple.  No apparent JVD.  RESP:  No IWOB.  Fair aeration bilaterally. CVS:  RRR.  2/6 SEM over RUSB and LUSB. ABD/GI/GU: BS+. Abd soft, NTND.  MSK/EXT:  Moves extremities. No apparent deformity.  Trace BLE edema. SKIN: no apparent skin lesion or wound NEURO: Awake and alert. Oriented appropriately.  No apparent focal neuro  deficit. PSYCH: Calm. Normal affect.   Discharge Instructions Discharge Instructions     Call MD for:  difficulty breathing, headache or visual disturbances   Complete by: As directed    Call MD for:  extreme fatigue   Complete by: As directed    Call MD for:  persistant dizziness or light-headedness   Complete by: As directed    Call MD for:  severe uncontrolled pain   Complete by: As directed    Diet - low sodium heart healthy   Complete by: As directed    Discharge instructions   Complete by: As directed    It has been a pleasure taking care of you!  You were hospitalized due to shortness of breath, cough, fatigue likely from pulmonary embolism (blood clot in your lung), possible pneumonia and COPD.  You have been treated with blood thinner for the blood clot.  You have been treated with antibiotic for the pneumonia.  We are discharging you on Eliquis (blood thinner) to continue taking at home.  We strongly recommend you avoid any over-the-counter pain medication other than plain Tylenol while  taking this blood thinner.  Please review your new medication list and the directions on your medications before you take them. Follow-up with your primary care doctor in 1 to 2 weeks.  Follow-up with your cardiologist as previously planned.   Take care,   Increase activity slowly   Complete by: As directed       Allergies as of 02/01/2022       Reactions   Ace Inhibitors Cough   Other reaction(s): Cough   Hydrochlorothiazide Other (See Comments)   Cramps   Latex Hives   Azithromycin Rash        Medication List     STOP taking these medications    ibuprofen 200 MG tablet Commonly known as: ADVIL       TAKE these medications    acetaminophen 650 MG CR tablet Commonly known as: TYLENOL Take 1,300 mg by mouth as needed for pain.   albuterol (2.5 MG/3ML) 0.083% nebulizer solution Commonly known as: PROVENTIL Take 2.5 mg by nebulization every 6 (six) hours as needed  for wheezing or shortness of breath.   albuterol 108 (90 Base) MCG/ACT inhaler Commonly known as: VENTOLIN HFA Inhale 1-2 puffs into the lungs every 6 (six) hours as needed for wheezing or shortness of breath.   Apixaban Starter Pack (10mg  and 5mg ) Commonly known as: ELIQUIS STARTER PACK Take as directed on package: start with two-5mg  tablets twice daily for 7 days. On day 8, switch to one-5mg  tablet twice daily.   apixaban 5 MG Tabs tablet Commonly known as: ELIQUIS Take 1 tablet (5 mg total) by mouth 2 (two) times daily. Start taking on: March 03, 2022   atorvastatin 40 MG tablet Commonly known as: LIPITOR Take 1 tablet (40 mg total) by mouth every morning.   [employer] Vitamin B12 1000 MCG Tbcr Generic drug: Cyanocobalamin Take 1,000 mcg by mouth daily.   Entresto 24-26 MG Generic drug: sacubitril-valsartan Take 1 tablet by mouth daily. Start taking on: February 05, 2022 What changed: These instructions start on February 05, 2022. If you are unsure what to do until then, ask your doctor or other care provider.   fluticasone 50 MCG/ACT nasal spray Commonly known as: FLONASE Place 2 sprays into the nose daily as needed for allergies.   folic acid 1 MG tablet Commonly known as: FOLVITE Take 1 mg by mouth daily.   furosemide 20 MG tablet Commonly known as: LASIX Take 1 tablet (20 mg total) by mouth daily.   Humira Pen 40 MG/0.4ML Pnkt Generic drug: Adalimumab Inject 40 mg into the skin every 14 (fourteen) days.   isosorbide mononitrate 30 MG 24 hr tablet Commonly known as: IMDUR Take 30 mg by mouth daily.   methotrexate 2.5 MG tablet Commonly known as: RHEUMATREX Take 7 tablets by mouth every Thursday.   MiraLax 17 GM/SCOOP powder Generic drug: polyethylene glycol powder Take 17 g by mouth daily as needed for moderate constipation. Dissolve 17 gm in 8 ounces of water and drink   montelukast 10 MG tablet Commonly known as: SINGULAIR Take 10 mg by mouth at bedtime.    omeprazole 20 MG capsule Commonly known as: PRILOSEC Take 20 mg by mouth 2 (two) times daily before a meal.        Consultations: Vascular surgery  Procedures/Studies: None   CT Angio Chest PE W and/or Wo Contrast  Addendum Date: 01/28/2022   ADDENDUM REPORT: 01/28/2022 20:55 ADDENDUM: Critical Value/emergent results were called by telephone at the time of interpretation on 01/28/2022  at 8:48 pm to provider Delman Kitten , who verbally acknowledged these results. Electronically Signed   By: Claudie Revering M.D.   On: 01/28/2022 20:55   Result Date: 01/28/2022 CLINICAL DATA:  Shortness of breath for 1 day.  Hypoxia. EXAM: CT ANGIOGRAPHY CHEST WITH CONTRAST TECHNIQUE: Multidetector CT imaging of the chest was performed using the standard protocol during bolus administration of intravenous contrast. Multiplanar CT image reconstructions and MIPs were obtained to evaluate the vascular anatomy. RADIATION DOSE REDUCTION: This exam was performed according to the departmental dose-optimization program which includes automated exposure control, adjustment of the mA and/or kV according to patient size and/or use of iterative reconstruction technique. CONTRAST:  95mL OMNIPAQUE IOHEXOL 350 MG/ML SOLN COMPARISON:  Portable chest obtained earlier today. Chest CT dated 11/25/2010. Abdomen and pelvis CT dated 03/04/2021 and limited right upper quadrant abdomen ultrasound dated 03/04/2021. FINDINGS: Cardiovascular: Enlarged heart. Atheromatous calcifications, including the coronary arteries and aorta. Minimal pericardial effusion. Multiple moderate-sized pulmonary emboli on the right. The right ventricular to left ventricular ratio is 1.06. Mediastinum/Nodes: Interval multiple mildly enlarged mediastinal and bilateral hilar lymph nodes. Mildly prominent left axillary lymph nodes without significant change. Small to moderate-sized hiatal hernia. Normal appearing thyroid gland. Lungs/Pleura: Interval mosaic pattern of  ground-glass opacity throughout both lungs. Mild peripheral patchy consolidation in both upper lobes. Bilateral paraseptal bullous changes. No pleural fluid. Upper Abdomen: Previously demonstrated multiple liver cysts. Partially included previously demonstrated bilateral upper pole cysts, including a large cyst indenting into the right lobe of the liver. Musculoskeletal: Thoracic and lower cervical spine degenerative changes. Review of the MIP images confirms the above findings. IMPRESSION: 1. Positive for acute PE with CTevidence of right heart strain (RV/LV Ratio = 1.06) consistent with at least submassive (intermediate risk) PE. The presence of right heart strain has been associated with an increased risk of morbidity and mortality. 2. Extensive bilateral was a ground-glass opacities with underlying changes of COPD. In the presence of interval mediastinal and bilateral hilar adenopathy, this is most likely infectious in origin with associated reactive adenopathy. 3. Cardiomegaly. 4. Previously demonstrated liver and bilateral renal cysts. 5.  Calcific coronary artery and aortic atherosclerosis. 6. Small to moderate-sized hiatal hernia. Aortic Atherosclerosis (ICD10-I70.0) and Emphysema (ICD10-J43.9). Electronically Signed: By: Claudie Revering M.D. On: 01/28/2022 20:46   US Venous Img Lower Bilateral (DVT)  Result Date: 01/29/2022 CLINICAL DATA:  Pulmonary embolism EXAM: BILATERAL LOWER EXTREMITY VENOUS DOPPLER ULTRASOUND TECHNIQUE: Gray-scale sonography with compression, as well as color and duplex ultrasound, were performed to evaluate the deep venous system(s) from the level of the common femoral vein through the popliteal and proximal calf veins. COMPARISON:  03/02/2021 FINDINGS: VENOUS Normal compressibility of the common femoral, superficial femoral, and popliteal veins, as well as the visualized calf veins. Visualized portions of profunda femoral vein and great saphenous vein unremarkable. No filling  defects to suggest DVT on grayscale or color Doppler imaging. Doppler waveforms show normal direction of venous flow, normal respiratory plasticity and response to augmentation. OTHER None. Limitations: none IMPRESSION: No lower extremity DVT Electronically Signed   By: Miachel Roux M.D.   On: 01/29/2022 06:09   DG Chest Port 1 View  Result Date: 01/28/2022 CLINICAL DATA:  Questionable sepsis. EXAM: PORTABLE CHEST 1 VIEW COMPARISON:  AP chest 12/02/2021, CT chest 11/25/2010, chest two views 09/27/2021 FINDINGS: Cardiac silhouette is again mildly enlarged. Moderate calcification is again seen within the aortic arch. Chronic moderate bilateral interstitial thickening is similar to 12/02/2021 and 09/27/2021. This is again  greatest within the lung bases. No definite pleural effusion. No pneumothorax. No acute skeletal abnormality. IMPRESSION: Moderate bilateral interstitial thickening is unchanged from multiple prior radiographs, likely chronic interstitial lung disease. No definite pneumonia. Electronically Signed   By: Yvonne Kendall M.D.   On: 01/28/2022 18:25   ECHOCARDIOGRAM COMPLETE  Result Date: 01/29/2022    ECHOCARDIOGRAM REPORT   Patient Name:   Tina Fields Date of Exam: 01/29/2022 Medical Rec #:  [id number]             Height:       66.0 in Accession #:    [id number]            Weight:       166.0 lb Date of Birth:  November 15, 1945              BSA:          1.848 m Patient Age:    54 years              BP:           149/74 mmHg Patient Gender: F                     HR:           106 bpm. Exam Location:  [facility] Procedure: 2D Echo Indications:     Pulmonary Embolus I26.09  History:         Patient has prior history of Echocardiogram examinations, most                  recent 03/03/2021.  Sonographer:     Kathlen Brunswick RDCS Referring Phys:  [id number] V PATEL Diagnosing Phys: [place]  1. Left ventricular ejection fraction, by estimation, is 45 to 50%. Left ventricular ejection fraction  by 2D MOD biplane is 48.1 %. The left ventricle has mildly decreased function. The left ventricle demonstrates regional wall motion abnormalities (see  scoring diagram/findings for description). There is mild left ventricular hypertrophy. Left ventricular diastolic parameters are consistent with Grade I diastolic dysfunction (impaired relaxation).  2. Right ventricular systolic function is mildly reduced. The right ventricular size is mildly enlarged. There is moderately elevated pulmonary artery systolic pressure.  3. The mitral valve is normal in structure. Mild mitral valve regurgitation.  4. The aortic valve is normal in structure. Aortic valve regurgitation is not visualized. No aortic stenosis is present.  5. Echo density distal to PV. Artifact versus possibly thrombus in setting of PE. Comparison(s): Compared to echo from [facility] in 2022, RV pressures are higher. Per report, EF sounds to be similar, with prior WMA present. FINDINGS  Left Ventricle: Left ventricular ejection fraction, by estimation, is 45 to 50%. Left ventricular ejection fraction by 2D MOD biplane is 48.1 %. The left ventricle has mildly decreased function. The left ventricle demonstrates regional wall motion abnormalities. Mild hypokinesis of the left ventricular, mid-apical anterolateral wall. The left ventricular internal cavity size was normal in size. There is mild left ventricular hypertrophy. Left ventricular diastolic parameters are consistent with Grade I diastolic dysfunction (impaired relaxation). Right Ventricle: The right ventricular size is mildly enlarged. Right vetricular wall thickness was not well visualized. Right ventricular systolic function is mildly reduced. There is moderately elevated pulmonary artery systolic pressure. The tricuspid  regurgitant velocity is 3.66 m/s, and with an assumed right atrial pressure of 5 mmHg, the estimated right ventricular systolic pressure is 0000000 mmHg. Left Atrium: Left atrial size was  normal in size. Right Atrium: Right atrial size was normal in size. Pericardium: There is no evidence of pericardial effusion. Mitral Valve: The mitral valve is normal in structure. Mild mitral valve regurgitation. Tricuspid Valve: The tricuspid valve is normal in structure. Tricuspid valve regurgitation is mild. Aortic Valve: The aortic valve is normal in structure. Aortic valve regurgitation is not visualized. No aortic stenosis is present. Aortic valve peak gradient measures 7.3 mmHg. Pulmonic Valve: Echo density distal to PV. Artifact versus possibly thrombus in setting of PE. The pulmonic valve was not well visualized. Pulmonic valve regurgitation is trivial. No evidence of pulmonic stenosis. Aorta: The aortic root and ascending aorta are structurally normal, with no evidence of dilitation. Venous: The inferior vena cava was not well visualized. IAS/Shunts: The interatrial septum was not well visualized.  LEFT VENTRICLE PLAX 2D                        Biplane EF (MOD) LVIDd:         3.95 cm         LV Biplane EF:   Left LVIDs:         3.15 cm                          ventricular LV PW:         1.07 cm                          ejection LV IVS:        1.43 cm                          fraction by LVOT diam:     1.80 cm                          2D MOD LV SV:         27                               biplane is LV SV Index:   15                               48.1 %. LVOT Area:     2.54 cm                                Diastology                                LV e' medial:    5.11 cm/s LV Volumes (MOD)               LV E/e' medial:  17.2 LV vol d, MOD    85.9 ml       LV e' lateral:   5.22 cm/s A2C:                           LV E/e' lateral: 16.8 LV vol d, MOD    48.9 ml A4C: LV vol s, MOD    38.2 ml A2C: LV vol s,  MOD    26.4 ml A4C: LV SV MOD A2C:   47.7 ml LV SV MOD A4C:   48.9 ml LV SV MOD BP:    32.6 ml RIGHT VENTRICLE RV Basal diam:  3.66 cm RV S prime:     10.70 cm/s TAPSE (M-mode): 1.6 cm LEFT ATRIUM              Index        RIGHT ATRIUM           Index LA diam:        4.50 cm 2.44 cm/m   RA Area:     16.60 cm LA Vol (A2C):   55.4 ml 29.99 ml/m  RA Volume:   44.40 ml  24.03 ml/m LA Vol (A4C):   45.4 ml 24.57 ml/m LA Biplane Vol: 54.8 ml 29.66 ml/m  AORTIC VALVE                 PULMONIC VALVE AV Area (Vmax): 1.54 cm     PV Vmax:          0.94 m/s AV Vmax:        135.00 cm/s  PV Peak grad:     3.6 mmHg AV Peak Grad:   7.3 mmHg     PR End Diast Vel: 8.29 msec LVOT Vmax:      81.60 cm/s LVOT Vmean:     54.200 cm/s LVOT VTI:       0.108 m  AORTA Ao Root diam: 3.00 cm Ao Asc diam:  2.90 cm MITRAL VALVE               TRICUSPID VALVE MV Area (PHT): 4.74 cm    TV Peak grad:   36.7 mmHg MV Decel Time: 160 msec    TV Vmax:        3.03 m/s MV E velocity: 87.80 cm/s  TR Peak grad:   53.6 mmHg                            TR Vmax:        366.00 cm/s                             SHUNTS                            Systemic VTI:  0.11 m                            Systemic Diam: 1.80 cm Donnelly Angelica Electronically signed by Donnelly Angelica Signature Date/Time: 01/29/2022/11:03:27 AM    Final        The results of significant diagnostics from this hospitalization (including imaging, microbiology, ancillary and laboratory) are listed below for reference.     Microbiology: Recent Results (from the past 240 hour(s))  Urine Culture     Status: Abnormal   Collection Time: 01/28/22  6:02 PM   Specimen: In/Out Cath Urine  Result Value Ref Range Status   Specimen Description   Final    IN/OUT CATH URINE Performed at [facility], [address] [place], [place] [address]    Special Requests   Final    NONE Performed at [facility], [address] [place], [place] [address]    Culture (A)  Final    <10,000 COLONIES/mL MULTIPLE SPECIES PRESENT, SUGGEST RECOLLECTION   Report Status 01/30/2022 FINAL  Final  Resp Panel by RT-PCR (Flu A&B, Covid) Nasopharyngeal Swab     Status: None   Collection Time:  01/28/22  6:14 PM   Specimen: Nasopharyngeal Swab; Nasal Swab  Result Value Ref Range Status   SARS Coronavirus 2 by RT PCR NEGATIVE NEGATIVE Final    Comment: (NOTE) SARS-CoV-2 target nucleic acids are NOT DETECTED.  The SARS-CoV-2 RNA is generally detectable in upper respiratory specimens during the acute phase of infection. The lowest concentration of SARS-CoV-2 viral copies this assay can detect is 138 copies/mL. A negative result does not preclude SARS-Cov-2 infection and should not be used as the sole basis for treatment or other patient management decisions. A negative result may occur with  improper specimen collection/handling, submission of specimen other than nasopharyngeal swab, presence of viral mutation(s) within the areas targeted by this assay, and inadequate number of viral copies(<138 copies/mL). A negative result must be combined with clinical observations, patient history, and epidemiological information. The expected result is Negative.  Fact Sheet for Patients:  BloggerCourse.com  Fact Sheet for Healthcare Providers:  SeriousBroker.it  This test is no t yet approved or cleared by the [place] FDA and  has been authorized for detection and/or diagnosis of SARS-CoV-2 by FDA under an Emergency Use Authorization (EUA). This EUA will remain  in effect (meaning this test can be used) for the duration of the COVID-19 declaration under Section 564(b)(1) of the Act, 21 U.S.C.section 360bbb-3(b)(1), unless the authorization is terminated  or revoked sooner.       Influenza A by PCR NEGATIVE NEGATIVE Final   Influenza B by PCR NEGATIVE NEGATIVE Final    Comment: (NOTE) The Xpert Xpress SARS-CoV-2/FLU/RSV plus assay is intended as an aid in the diagnosis of influenza from Nasopharyngeal swab specimens and should not be used as a sole basis for treatment. Nasal washings and aspirates are unacceptable for Xpert  Xpress SARS-CoV-2/FLU/RSV testing.  Fact Sheet for Patients: BloggerCourse.com  Fact Sheet for Healthcare Providers: SeriousBroker.it  This test is not yet approved or cleared by the [place] FDA and has been authorized for detection and/or diagnosis of SARS-CoV-2 by FDA under an Emergency Use Authorization (EUA). This EUA will remain in effect (meaning this test can be used) for the duration of the COVID-19 declaration under Section 564(b)(1) of the Act, 21 U.S.C. section 360bbb-3(b)(1), unless the authorization is terminated or revoked.  Performed at [facility], [address] [place], [place] [address]   Blood Culture (routine x 2)     Status: None (Preliminary result)   Collection Time: 01/28/22  6:14 PM   Specimen: BLOOD  Result Value Ref Range Status   Specimen Description BLOOD BLOOD LEFT HAND  Final   Special Requests   Final    BOTTLES DRAWN AEROBIC AND ANAEROBIC Blood Culture adequate volume   Culture   Final    NO GROWTH 4 DAYS Performed at [facility], [address], [place], [place] [address]    Report Status PENDING  Incomplete  Blood Culture (routine x 2)     Status: None (Preliminary result)   Collection Time: 01/28/22  6:15 PM   Specimen: BLOOD  Result Value Ref Range Status   Specimen Description BLOOD RIGHT ANTECUBITAL  Final   Special Requests   Final    BOTTLES DRAWN AEROBIC AND ANAEROBIC Blood Culture adequate volume   Culture  Final    NO GROWTH 4 DAYS Performed at [facility], [address] [place], [place] [address]    Report Status PENDING  Incomplete  MRSA Next Gen by PCR, Nasal     Status: None   Collection Time: 01/29/22  1:00 AM   Specimen: Nasal Mucosa; Nasal Swab  Result Value Ref Range Status   MRSA by PCR Next Gen NOT DETECTED NOT DETECTED Final    Comment: (NOTE) The GeneXpert MRSA Assay (FDA approved for NASAL specimens only), is one  component of a comprehensive MRSA colonization surveillance program. It is not intended to diagnose MRSA infection nor to guide or monitor treatment for MRSA infections. Test performance is not FDA approved in patients less than 78 years old. Performed at [facility], [place], [place] [address]   Expectorated Sputum Assessment w Gram Stain, Rflx to Resp Cult     Status: None   Collection Time: 01/29/22  4:47 AM   Specimen: Expectorated Sputum  Result Value Ref Range Status   Specimen Description EXPECTORATED SPUTUM  Final   Special Requests NONE  Final   Sputum evaluation   Final    Sputum specimen not acceptable for testing.  Please recollect.   Performed at [facility], [address] [place], [place] [address]    Report Status 01/29/2022 FINAL  Final  Expectorated Sputum Assessment w Gram Stain, Rflx to Resp Cult     Status: None   Collection Time: 01/29/22  6:39 AM   Specimen: Sputum  Result Value Ref Range Status   Specimen Description SPU  Final   Special Requests SPU  Final   Sputum evaluation   Final    Sputum specimen not acceptable for testing.  Please recollect.   Performed at [facility], [address] [place], [place] [address]    Report Status 01/29/2022 FINAL  Final  Respiratory (~20 pathogens) panel by PCR     Status: None   Collection Time: 01/30/22 11:43 AM   Specimen: Nasopharyngeal Swab; Respiratory  Result Value Ref Range Status   Adenovirus NOT DETECTED NOT DETECTED Final   Coronavirus 229E NOT DETECTED NOT DETECTED Final    Comment: (NOTE) The Coronavirus on the Respiratory Panel, DOES NOT test for the novel  Coronavirus (2019 nCoV)    Coronavirus HKU1 NOT DETECTED NOT DETECTED Final   Coronavirus NL63 NOT DETECTED NOT DETECTED Final   Coronavirus OC43 NOT DETECTED NOT DETECTED Final   Metapneumovirus NOT DETECTED NOT DETECTED Final   Rhinovirus / Enterovirus NOT DETECTED NOT DETECTED Final    Influenza A NOT DETECTED NOT DETECTED Final   Influenza B NOT DETECTED NOT DETECTED Final   Parainfluenza Virus 1 NOT DETECTED NOT DETECTED Final   Parainfluenza Virus 2 NOT DETECTED NOT DETECTED Final   Parainfluenza Virus 3 NOT DETECTED NOT DETECTED Final   Parainfluenza Virus 4 NOT DETECTED NOT DETECTED Final   Respiratory Syncytial Virus NOT DETECTED NOT DETECTED Final   Bordetella pertussis NOT DETECTED NOT DETECTED Final   Bordetella Parapertussis NOT DETECTED NOT DETECTED Final   Chlamydophila pneumoniae NOT DETECTED NOT DETECTED Final   Mycoplasma pneumoniae NOT DETECTED NOT DETECTED Final    Comment: Performed at [facility], [place], [place], [place] [address]     Labs:  CBC: Recent Labs  Lab 01/28/22 1814 01/29/22 0236 01/30/22 0344 01/31/22 0607 02/01/22 0554  WBC 7.2 7.9 5.0 11.7* 9.0  NEUTROABS 5.7  --   --   --   --  HGB 12.1 12.1 11.0* 11.7* 11.6*  HCT 37.2 36.9 33.9* 35.8* 36.2  MCV 95.4 94.4 95.2 93.2 96.0  PLT 183 169 184 266 256   BMP &GFR Recent Labs  Lab 01/28/22 1814 01/29/22 0236 01/29/22 0915 01/30/22 0344 01/30/22 1653 01/31/22 0607 02/01/22 0554  NA 141 138 135 141 137  --  138  K 2.5* 2.7*  2.7* 3.6 3.7 3.4*  --  4.5  CL 104 105 106 109 106  --  111  CO2 27 24 22 22  21*  --  21*  GLUCOSE 131* 123* 118* 132* 216*  --  191*  BUN 17 14 14  29* 46*  --  39*  CREATININE 1.02* 0.90 0.77 0.86 1.20*  --  0.95  CALCIUM 7.5* 7.4* 7.0* 7.6* 8.0*  --  8.1*  MG 1.5* 2.0  --  2.0  --  2.2 2.1  PHOS  --   --   --  3.6  --  2.5 2.2*   Estimated Creatinine Clearance: 50.6 mL/min (by C-G formula based on SCr of 0.95 mg/dL). Liver & Pancreas: Recent Labs  Lab 01/28/22 1814 01/29/22 0236  AST 21 22  ALT 14 15  ALKPHOS 94 99  BILITOT 2.9* 2.7*  PROT 6.6 6.6  ALBUMIN 2.7* 2.7*   No results for input(s): LIPASE, AMYLASE in the last 168 hours. No results for input(s): AMMONIA in the last 168 hours. Diabetic: No results for  input(s): HGBA1C in the last 72 hours. Recent Labs  Lab 01/29/22 0023 01/29/22 0746 01/29/22 1135 01/29/22 1609 01/29/22 2352  GLUCAP 132* 102* 136* 148* 146*   Cardiac Enzymes: No results for input(s): CKTOTAL, CKMB, CKMBINDEX, TROPONINI in the last 168 hours. No results for input(s): PROBNP in the last 8760 hours. Coagulation Profile: Recent Labs  Lab 01/28/22 1814  INR 1.2   Thyroid Function Tests: No results for input(s): TSH, T4TOTAL, FREET4, T3FREE, THYROIDAB in the last 72 hours. Lipid Profile: No results for input(s): CHOL, HDL, LDLCALC, TRIG, CHOLHDL, LDLDIRECT in the last 72 hours. Anemia Panel: No results for input(s): VITAMINB12, FOLATE, FERRITIN, TIBC, IRON, RETICCTPCT in the last 72 hours. Urine analysis:    Component Value Date/Time   COLORURINE AMBER (A) 01/29/2022 0800   APPEARANCEUR CLOUDY (A) 01/29/2022 0800   APPEARANCEUR Clear 01/16/2012 0248   LABSPEC 1.040 (H) 01/29/2022 0800   LABSPEC 1.009 01/16/2012 0248   PHURINE 5.0 01/29/2022 0800   GLUCOSEU NEGATIVE 01/29/2022 0800   GLUCOSEU Negative 01/16/2012 0248   HGBUR SMALL (A) 01/29/2022 0800   BILIRUBINUR SMALL (A) 01/29/2022 0800   BILIRUBINUR Negative 01/16/2012 0248   KETONESUR 5 (A) 01/29/2022 0800   PROTEINUR 100 (A) 01/29/2022 0800   NITRITE NEGATIVE 01/29/2022 0800   LEUKOCYTESUR LARGE (A) 01/29/2022 0800   LEUKOCYTESUR Negative 01/16/2012 0248   Sepsis Labs: Invalid input(s): PROCALCITONIN, LACTICIDVEN   Time coordinating discharge: 45 minutes  SIGNED:  Mercy Riding, MD  [facility] 02/01/2022, 11:56 PM

## 2022-02-01 NOTE — Evaluation (Signed)
Occupational Therapy Evaluation Patient Details Name: Tina Fields MRN: [id number] DOB: [birth year] Today's Date: 02/01/2022   History of Present Illness Pt is a 76 year old woman admitted with acute respiratory failure due to PE, admitted on 5/27 from home after 3 days of SOB wtih produtive cough, vomiting, fatigue. 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 G1DD, LA mod dilated),HLD,T2DM, TIA & CVA, Nodule of the R lung, Uterine prolapse   Clinical Impression     Chart reviewed, RN cleared pt for participation in OT evaluation. Pt alert and oriented x4, agreeable to evaluation. Pt reports she has assistance from her son at home as needed and her cousin will assist with bathing tasks as needed. Pt performs bed mobility with MOD I with HOB raised, STS with supervision, amb around room without AD with supervision-CGA. Grooming tasks completed at sink level with supervision-CGA. Pt presents with deficits in activity tolerance, strength and endurance affecting optimal ADL completion. Pt will benefit from [facility] to address deficits, to facilitate return to PLOF. Pt is left in bedside chair, NAD, all needs met. OT will follow acutely.    Recommendations for follow up therapy are one component of a multi-disciplinary discharge planning process, led by the attending physician.  Recommendations may be updated based on patient status, additional functional criteria and insurance authorization.   Follow Up Recommendations  Home health OT    Assistance Recommended at Discharge Intermittent Supervision/Assistance  Patient can return home with the following A little help with bathing/dressing/bathroom;Assistance with cooking/housework    Functional Status Assessment  Patient has  had a recent decline in their functional status and demonstrates the ability to make significant improvements in function in a reasonable and predictable amount of time.  Equipment Recommendations  None recommended by OT;Other (comment) (pt has bsc and shower chair at home)    Recommendations for Other Services       Precautions / Restrictions Precautions Precautions: Fall Precaution Comments: watch spo2 Restrictions Weight Bearing Restrictions: No      Mobility Bed Mobility Overal bed mobility: Needs Assistance Bed Mobility: Supine to Sit     Supine to sit: Modified independent (Device/Increase time), HOB elevated     General bed mobility comments: increased time    Transfers Overall transfer level: Needs assistance   Transfers: Sit to/from Stand Sit to Stand: Supervision                  Balance Overall balance assessment: Needs assistance Sitting-balance support: Feet supported Sitting balance-Leahy Scale: Good Sitting balance - Comments: good dynamic sitting balance   Standing balance support: During functional activity, No upper extremity supported Standing balance-Leahy Scale: Fair Standing balance comment: fair dynamic standing balance requiring fading CGA at sink level for grooming tasks                           ADL either performed or assessed with clinical judgement   ADL Overall ADL's : Needs assistance/impaired Eating/Feeding: Set up;Sitting   Grooming: Wash/dry hands;Wash/dry face;Oral care;Standing;Supervision/safety-CGA Grooming Details (indicate cue type and reason): vcs for [facility]                 Toilet Transfer: Supervision/safety;Ambulation;BSC/3in1;Min guard   Toileting- Clothing Manipulation and Hygiene: Sit to/from stand;Minimal assistance Toileting - Clothing  Manipulation Details (indicate cue type and reason): for thoroughness     Functional mobility during ADLs: Min guard;Supervision/safety (throughout room)        Vision Patient Visual Report: No change from baseline       Perception     Praxis      Pertinent Vitals/Pain Pain Assessment Pain Assessment: No/denies pain     Hand Dominance     Extremity/Trunk Assessment Upper Extremity Assessment Upper Extremity Assessment: RUE deficits/detail;LUE deficits/detail RUE Deficits / Details: 4-5/ throughout BUE   Lower Extremity Assessment Lower Extremity Assessment: Generalized weakness;Defer to PT evaluation   Cervical / Trunk Assessment Cervical / Trunk Assessment: Normal   Communication Communication Communication: No difficulties   Cognition Arousal/Alertness: Awake/alert Behavior During Therapy: WFL for tasks assessed/performed Overall Cognitive Status: Within Functional Limits for tasks assessed                                       General Comments  spo2 briefly down to 83% after approx 3 minutes in standing however up to above 95% while amb to bedside chair without rest break; HR up to 104 with mobility    Exercises Other Exercises Other Exercises: edu re: role of OT, role of rehab, discharge recommendations, home safety, falls prevention, energy conservation techniques   Shoulder Instructions      Home Living Family/patient expects to be discharged to:: Private residence Living Arrangements: Children Available Help at Discharge: Family;Available 24 hours/day (son) Type of Home: House Home Access: Stairs to enter [employer] of Steps: 3 (around back) Entrance Stairs-Rails: None Home Layout: Two level;Able to live on main level with bedroom/bathroom     Bathroom Shower/Tub: Tub/shower unit   Bathroom Toilet: Standard (has toilet riser, has not been installed) Bathroom Accessibility: Yes   Home Equipment: BSC/3in1;Rolling Walker (2 wheels);Shower seat          Prior Functioning/Environment Prior Level of Function : Independent/Modified Independent;Needs assist       Physical  Assist : ADLs (physical)   ADLs (physical): IADLs Mobility Comments: amb with no AD, RW community distances ADLs Comments: son assist with IADLs; cousin who is a HHA comes to help with bathing as needed        OT Problem List: Decreased activity tolerance;Cardiopulmonary status limiting activity;Decreased knowledge of use of DME or AE      OT Treatment/Interventions: Self-care/ADL training;Patient/family education;Energy conservation;DME and/or AE instruction;Therapeutic activities;Therapeutic exercise    OT Goals(Current goals can be found in the care plan section) Acute Rehab OT Goals Patient Stated Goal: go home OT Goal Formulation: With patient Time For Goal Achievement: 02/15/22 Potential to Achieve Goals: Good ADL Goals Pt Will Perform Grooming: sitting;standing;with modified independence Pt Will Perform Lower Body Dressing: with modified independence;sit to/from stand Pt Will Transfer to Toilet: with modified independence;ambulating Pt Will Perform Toileting - Clothing Manipulation and hygiene: with modified independence;sit to/from stand  OT Frequency: Min 2X/week    Co-evaluation              AM-PAC OT "6 Clicks" Daily Activity     Outcome Measure Help from another person eating meals?: None Help from another person taking care of personal grooming?: None Help from another person toileting, which includes using toliet, bedpan, or urinal?: A Little Help from another person bathing (including washing, rinsing, drying)?: A Little Help from another person to put on and taking off regular upper body  clothing?: None Help from another person to put on and taking off regular lower body clothing?: A Little 6 Click Score: 21   End of Session Nurse Communication: Mobility status  Activity Tolerance: Patient tolerated treatment well Patient left: in chair;with call bell/phone within reach;with chair alarm set  OT Visit Diagnosis: Unsteadiness on feet (R26.81);Muscle  weakness (generalized) (M62.81)                Time: 0567-8893 OT Time Calculation (min): 27 min Charges:  OT General Charges $OT Visit: 1 Visit OT Evaluation $OT Eval Low Complexity: 1 Low OT Treatments $Self Care/Home Management : 8-22 mins  Shanon Payor, OTD OTR/L  02/01/22, 10:16 AM

## 2022-02-01 NOTE — Evaluation (Signed)
Physical Therapy Evaluation Patient Details Name: Tina Fields MRN: [id number] DOB: [birth year] Today's Date: 02/01/2022  History of Present Illness  Patient is a 76 year old woman admitted with acute respiratory failure due to PE, admitted on 5/27 from home after 3 days of SOB wtih produtive cough, vomiting, fatigue. 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 G1DD, LA mod dilated),HLD,T2DM, TIA & CVA, Nodule of the R lung, Uterine prolapse   Clinical Impression  Patient received in recliner. She is pleasant and talkative. Requesting to get back into bed. She is independent with sit to stand, ambulated 25 feet in room without ad, supervision. No lob. O2 sats down to 87% with this, increased to 92% with rest. She appears to be close to baseline as she reports she does not do a lot at home. She will benefit from continued skilled PT to maximize activity tolerance and safety.        Recommendations for follow up therapy are one component of a multi-disciplinary discharge planning process, led by the attending physician.  Recommendations may be updated based on patient status, additional functional criteria and insurance authorization.  Follow Up Recommendations Home health PT    Assistance Recommended at Discharge Intermittent Supervision/Assistance  Patient can return home with the following  A little help with walking and/or transfers;A little help with bathing/dressing/bathroom;Assist for transportation;Help with stairs or ramp for entrance;Assistance with cooking/housework    Equipment Recommendations None recommended by PT  Recommendations for Other Services       Functional Status Assessment Patient has had a recent decline in their functional status and  demonstrates the ability to make significant improvements in function in a reasonable and predictable amount of time.     Precautions / Restrictions Precautions Precautions: Fall Precaution Comments: watch spo2 Restrictions Weight Bearing Restrictions: No      Mobility  Bed Mobility Overal bed mobility: Modified Independent Bed Mobility: Sit to Supine       Sit to supine: Modified independent (Device/Increase time)   General bed mobility comments: increased time    Transfers Overall transfer level: Modified independent Equipment used: None Transfers: Sit to/from Stand Sit to Stand: Modified independent (Device/Increase time)           General transfer comment: no assist provided to stand from recliner    Ambulation/Gait Ambulation/Gait assistance: Supervision Gait Distance (Feet): 25 Feet Assistive device: None Gait Pattern/deviations: Step-through pattern, Decreased stride length Gait velocity: decr     General Gait Details: no lob, declined ambulating further due to fatigue. wanted to get back into bed.  Stairs            Wheelchair Mobility    Modified Rankin (Stroke Patients Only)       Balance Overall balance assessment: Needs assistance Sitting-balance support: Feet supported Sitting balance-Leahy Scale: Normal     Standing balance support: No upper extremity supported, During functional activity Standing balance-Leahy Scale: Good Standing balance comment: no lob with ambulation in room.                             Pertinent Vitals/Pain Pain Assessment Pain Assessment: No/denies pain    Home Living Family/patient expects to be discharged to:: Private residence Living Arrangements: Children Available Help at Discharge: Family;Available 24  hours/day Type of Home: House Home Access: Stairs to enter Entrance Stairs-Rails: None Entrance Stairs-Number of Steps: 3 (around back)   Home Layout: Two level;Able to live on main  level with bedroom/bathroom Home Equipment: BSC/3in1;Rolling Walker (2 wheels);Shower seat      Prior Function Prior Level of Function : Independent/Modified Independent;Needs assist       Physical Assist : ADLs (physical)   ADLs (physical): IADLs Mobility Comments: amb with no AD, RW community distances ADLs Comments: son assist with IADLs; cousin who is a HHA comes to help with bathing as needed     Hand Dominance        Extremity/Trunk Assessment   Upper Extremity Assessment Upper Extremity Assessment: RUE deficits/detail;LUE deficits/detail RUE Deficits / Details: 4-5/ throughout BUE    Lower Extremity Assessment Lower Extremity Assessment: Generalized weakness    Cervical / Trunk Assessment Cervical / Trunk Assessment: Normal  Communication   Communication: No difficulties  Cognition Arousal/Alertness: Awake/alert Behavior During Therapy: WFL for tasks assessed/performed Overall Cognitive Status: Within Functional Limits for tasks assessed                                          General Comments General comments (skin integrity, edema, etc.): spo2 briefly down to 83% after approx 3 minutes in standing however up to above 95% while amb to bedside chair without rest break; HR up to 104 with mobility    Exercises     Assessment/Plan    PT Assessment Patient needs continued PT services  PT Problem List Decreased strength;Decreased mobility;Decreased activity tolerance;Decreased balance;Cardiopulmonary status limiting activity       PT Treatment Interventions Therapeutic exercise;DME instruction;Gait training;Stair training;Balance training;Functional mobility training;Therapeutic activities;Patient/family education    PT Goals (Current goals can be found in the Care Plan section)  Acute Rehab PT Goals Patient Stated Goal: return home PT Goal Formulation: With patient Time For Goal Achievement: 02/09/22 Potential to Achieve Goals: Good     Frequency Min 2X/week     Co-evaluation               AM-PAC PT "6 Clicks" Mobility  Outcome Measure Help needed turning from your back to your side while in a flat bed without using bedrails?: None Help needed moving from lying on your back to sitting on the side of a flat bed without using bedrails?: None Help needed moving to and from a bed to a chair (including a wheelchair)?: None Help needed standing up from a chair using your arms (e.g., wheelchair or bedside chair)?: None Help needed to walk in hospital room?: A Little Help needed climbing 3-5 steps with a railing? : A Little 6 Click Score: 22    End of Session   Activity Tolerance: Patient limited by fatigue Patient left: in bed;with bed alarm set;with call bell/phone within reach Nurse Communication: Mobility status PT Visit Diagnosis: Muscle weakness (generalized) (M62.81);Other abnormalities of gait and mobility (R26.89);Difficulty in walking, not elsewhere classified (R26.2)    Time: [id number] PT Time Calculation (min) (ACUTE ONLY): 15 min   Charges:   PT Evaluation $PT Eval Low Complexity: 1 Low         Rashi Granier, PT, GCS 02/01/22,10:31 AM

## 2022-02-01 NOTE — TOC Transition Note (Addendum)
Transition of Care [facility]) - CM/SW Discharge Note   Patient Details  Name: Tina Fields MRN: [id number] Date of Birth: February 17, 1946  [facility] CM/SW Contact:  Laurena Slimmer, RN Phone Number: 02/01/2022, 1:49 PM   Clinical Narrative:     Admitted from:Home PCP: Dr. Rebeca Alert  Pharmacy: [facility] Transportation: Patient's niece Patient is active with [facility].  Merleen Nicely at [facility] notified.  TOC signing off          Patient Goals and CMS Choice        Discharge Placement                       Discharge Plan and Services                                     Social Determinants of Health (SDOH) Interventions     Readmission Risk Interventions    12/04/2021    2:04 PM  Readmission Risk Prevention Plan  Transportation Screening Complete  PCP or Specialist Appt within 5-7 Days Complete  Home Care Screening Complete  Medication Review (RN CM) Referral to Pharmacy

## 2022-02-01 NOTE — TOC Progression Note (Signed)
Transition of Care [facility](TOC) - Progression Note    Patient Details  Name: Alyssa GroveJackie Vincent  MRN: [id number] Date of Birth: 07/04/1946  Transition of Care [facility](TOC) CM/SW Contact  Truddie HiddenKeona C Evianna Chandran, RN Phone Number: 02/01/2022, 2:50 PM  Clinical Narrative:    Home oxygen requested via [employer]        Expected Discharge Plan and Services           Expected Discharge Date: 02/01/22                                     Social Determinants of Health (SDOH) Interventions    Readmission Risk Interventions    12/04/2021    2:04 PM  Readmission Risk Prevention Plan  Transportation Screening Complete  PCP or Specialist Appt within 5-7 Days Complete  Home Care Screening Complete  Medication Review (RN CM) Referral to Pharmacy

## 2022-02-01 NOTE — Progress Notes (Signed)
Patient instructed on AVS including follow up appointments, medication admin and where to get new Rx, When to call MD, and heart failure S&S.  Patient was brought oxygen from [employer] to take home, Was instructed by rep on use of oxygen.  Patient packed all belongings and was taken out to personal family car.  No s/s of distress noted denies pain.

## 2022-02-01 NOTE — Plan of Care (Signed)

## 2022-02-01 NOTE — Assessment & Plan Note (Signed)
Stable.  Continue home medications. 

## 2022-02-01 NOTE — Progress Notes (Signed)
SATURATION QUALIFICATIONS: (This note is used to comply with regulatory documentation for home oxygen)  Patient Saturations on Room Air at Rest = 94%  Patient Saturations on Room Air while Ambulating = 86%  Patient Saturations on 2 Liters of oxygen while Ambulating = 91%

## 2022-02-01 NOTE — Plan of Care (Signed)
  Problem: Health Behavior/Discharge Planning: Goal: Ability to manage health-related needs will improve Outcome: Progressing   Problem: Clinical Measurements: Goal: Respiratory complications will improve Outcome: Progressing   Problem: Clinical Measurements: Goal: Cardiovascular complication will be avoided Outcome: Progressing   Problem: Activity: Goal: Risk for activity intolerance will decrease Outcome: Progressing   Problem: Elimination: Goal: Will not experience complications related to urinary retention Outcome: Progressing   Problem: Pain Managment: Goal: General experience of comfort will improve Outcome: Progressing

## 2022-02-02 ENCOUNTER — Ambulatory Visit: Payer: Self-pay | Admitting: Family

## 2022-02-02 LAB — CULTURE, BLOOD (ROUTINE X 2)
Culture: NO GROWTH
Culture: NO GROWTH
Special Requests: ADEQUATE
Special Requests: ADEQUATE

## 2022-02-09 ENCOUNTER — Encounter: Payer: Self-pay | Admitting: Family

## 2022-02-09 ENCOUNTER — Ambulatory Visit: Payer: Medicare Other | Attending: Family | Admitting: Family

## 2022-02-09 VITALS — BP 119/72 | HR 99 | Resp 16 | Ht 66.0 in | Wt 166.4 lb

## 2022-02-09 DIAGNOSIS — I700 Atherosclerosis of aorta: Secondary | ICD-10-CM | POA: Diagnosis not present

## 2022-02-09 DIAGNOSIS — Z79899 Other long term (current) drug therapy: Secondary | ICD-10-CM | POA: Diagnosis not present

## 2022-02-09 DIAGNOSIS — I2510 Atherosclerotic heart disease of native coronary artery without angina pectoris: Secondary | ICD-10-CM | POA: Insufficient documentation

## 2022-02-09 DIAGNOSIS — J45909 Unspecified asthma, uncomplicated: Secondary | ICD-10-CM | POA: Diagnosis not present

## 2022-02-09 DIAGNOSIS — I5022 Chronic systolic (congestive) heart failure: Secondary | ICD-10-CM | POA: Insufficient documentation

## 2022-02-09 DIAGNOSIS — M069 Rheumatoid arthritis, unspecified: Secondary | ICD-10-CM | POA: Diagnosis not present

## 2022-02-09 DIAGNOSIS — Z87891 Personal history of nicotine dependence: Secondary | ICD-10-CM | POA: Insufficient documentation

## 2022-02-09 DIAGNOSIS — I5020 Unspecified systolic (congestive) heart failure: Secondary | ICD-10-CM

## 2022-02-09 DIAGNOSIS — I110 Hypertensive heart disease with heart failure: Secondary | ICD-10-CM | POA: Insufficient documentation

## 2022-02-09 DIAGNOSIS — Z7901 Long term (current) use of anticoagulants: Secondary | ICD-10-CM | POA: Diagnosis not present

## 2022-02-09 DIAGNOSIS — I2782 Chronic pulmonary embolism: Secondary | ICD-10-CM

## 2022-02-09 DIAGNOSIS — Z9981 Dependence on supplemental oxygen: Secondary | ICD-10-CM | POA: Diagnosis not present

## 2022-02-09 DIAGNOSIS — I10 Essential (primary) hypertension: Secondary | ICD-10-CM | POA: Diagnosis not present

## 2022-02-09 DIAGNOSIS — G4733 Obstructive sleep apnea (adult) (pediatric): Secondary | ICD-10-CM | POA: Insufficient documentation

## 2022-02-09 DIAGNOSIS — Z86711 Personal history of pulmonary embolism: Secondary | ICD-10-CM | POA: Diagnosis not present

## 2022-02-09 NOTE — Patient Instructions (Addendum)
Continue weighing daily and call for an overnight weight gain of 3 pounds or more or a weekly weight gain of more than 5 pounds.   If you have voicemail, please make sure your mailbox is cleaned out so that we may leave a message and please make sure to listen to any voicemails.     Check blood pressure some times in the morning and some times in the evening. Write the readings down and bring those readings with you to your next appointment

## 2022-02-09 NOTE — Progress Notes (Signed)
Patient ID: Tina Fields, female    DOB: 1946/08/19, 76 y.o.   MRN: [id number]  HPI  Ms Collings is a 76 y/o female with a history of asthma, CAD, HTN, aortic atherosclerosis, asthma, GERD, obstructive sleep apnea, RA, previous tobacco use and chronic heart failure.   Echo report from 01/29/22 reviewed and showed an EF of 45-50% along with mild LVH, moderately elevated PA pressure and mild MR. Echo report from 05/03/21 reviewed and showed an EF of 45-50% with moderate LAE.   Admitted 01/28/22 due to productive cough, worsening DOE, fatigue and nausea. Right lung PE and CAP. Vascular surgery consulted. IV heparin started. Negative DVT. Antibiotics given for pneumonia. Given IVF with subsequent respiratory distress. IVF stopped and IV lasix given. Potassium and magnesium supplemented. Discharged after 4 days. Admitted 12/02/21 due to SOB due to HF. Cardiology consult obtained. Initially given IV lasix with transition to oral diuretics. Discharged after 3 days.   She presents today for a follow-up visit with a chief complaint of moderate fatigue with minimal exertion. Describes this as chronic in nature. Has associated cough, shortness of breath, hand pain (due to RA) and intermittent dizziness along with this. She denies any difficulty sleeping, abdominal distention, palpitations, pedal edema, chest pain or weight gain.   Wearing oxygen at 2L around the clock.   Has been taking entresto once daily due to BP. She says that she does check her BP once daily at home.   Saw her PCP earlier today and had lab work drawn.   Past Medical History:  Diagnosis Date   Aortic atherosclerosis (HCC)    Asthma    CHF (congestive heart failure) (HCC)    Coronary artery disease    Dyspnea    Edema of both legs    GERD (gastroesophageal reflux disease)    HFrEF (heart failure with reduced ejection fraction) (HCC)    History of 2019 novel coronavirus disease (COVID-19) 10/28/2020   Hypertension     Myocardial infarction (HCC)    OSA (obstructive sleep apnea)    noncompliant with nocturnal PAP therapy   RA (rheumatoid arthritis) (HCC)    Past Surgical History:  Procedure Laterality Date   BREAST SURGERY     CARDIAC CATHETERIZATION Left 06/21/2005   Moderate ostial LCx and LAD disease; LVEF 45%; Location: [facility]; Surgeon: Adrian Blackwater, MD   CARDIAC CATHETERIZATION Left 05/20/2008   No occlusive CAD; LVEF 65%; Location: [facility]; Surgeon: Rudean Hitt, MD   COLONOSCOPY     ESOPHAGOGASTRODUODENOSCOPY     TUBAL LIGATION     Family History  Problem Relation Age of Onset   CAD Mother    Diabetes Mother    Hypertension Mother    CAD Father    Diabetes Father    Hypertension Father    Stroke Father    Social History   Tobacco Use   Smoking status: Former    Types: Cigarettes    Quit date: 01/09/1995    Years since quitting: 27.1   Smokeless tobacco: Never  Substance Use Topics   Alcohol use: No   Allergies  Allergen Reactions   Ace Inhibitors Cough    Other reaction(s): Cough    Hydrochlorothiazide Other (See Comments)    Cramps   Latex Hives   Azithromycin Rash   Prior to Admission medications   Medication Sig Start Date End Date Taking? Authorizing Provider  acetaminophen (TYLENOL) 650 MG CR tablet Take 1,300 mg by mouth as needed for pain.   Yes  [provider]  albuterol (VENTOLIN HFA) 108 (90 Base) MCG/ACT inhaler Inhale 1-2 puffs into the lungs every 6 (six) hours as needed for wheezing or shortness of breath. 11/06/20  Yes [provider]  apixaban (ELIQUIS) 5 MG TABS tablet Take 1 tablet (5 mg total) by mouth 2 (two) times daily. 03/03/22  Yes Almon Hercules, MD  APIXABAN Everlene Balls) VTE STARTER PACK (10MG  AND 5MG ) Take as directed on package: start with two-5mg  tablets twice daily for 7 days. On day 8, switch to one-5mg  tablet twice daily. Patient taking differently: Take as directed on package: start with two-5mg  tablets twice daily for 7 days. On  day 8, switch to one-5mg  tablet twice daily.  Started taking 5 mg on 02/09/22 02/01/22  Yes Almon Hercules, MD  atorvastatin (LIPITOR) 40 MG tablet Take 1 tablet (40 mg total) by mouth every morning. 12/05/21 03/05/22 Yes Darlin Drop, DO  [facility] VITAMIN B12 1000 MCG TBCR Take 1,000 mcg by mouth daily. 12/25/21  Yes [provider]  ergocalciferol (VITAMIN D2) 1.25 MG (50000 UT) capsule Take 50,000 Units by mouth once a week. Every monday   Yes [provider]  fluticasone (FLONASE) 50 MCG/ACT nasal spray Place 2 sprays into the nose daily as needed for allergies. 12/03/18  Yes [provider]  folic acid (FOLVITE) 1 MG tablet Take 1 mg by mouth daily. 09/19/21  Yes [provider]  furosemide (LASIX) 20 MG tablet Take 1 tablet (20 mg total) by mouth daily. Patient taking differently: Take 20 mg by mouth daily. Takes 2 tablets (40 mg) once daily 12/05/21 03/05/22 Yes Hall, Carole N, DO  isosorbide mononitrate (IMDUR) 30 MG 24 hr tablet Take 30 mg by mouth daily.   Yes [provider]  MIRALAX 17 GM/SCOOP powder Take 17 g by mouth daily as needed for moderate constipation. Dissolve 17 gm in 8 ounces of water and drink 01/11/21  Yes [provider]  montelukast (SINGULAIR) 10 MG tablet Take 10 mg by mouth at bedtime.   Yes [provider]  sacubitril-valsartan (ENTRESTO) 24-26 MG Take 1 tablet by mouth daily. 02/05/22 05/06/22 Yes Almon Hercules, MD  albuterol (PROVENTIL) (2.5 MG/3ML) 0.083% nebulizer solution Take 2.5 mg by nebulization every 6 (six) hours as needed for wheezing or shortness of breath. Patient not taking: Reported on 12/15/2021    [provider]  HUMIRA PEN 40 MG/0.4ML PNKT Inject 40 mg into the skin every 14 (fourteen) days. Patient not taking: Reported on 02/09/2022 01/16/22   [provider]  methotrexate (RHEUMATREX) 2.5 MG tablet Take 7 tablets by mouth every Thursday. Patient not taking: Reported on 02/09/2022 10/15/21    [provider]  omeprazole (PRILOSEC) 20 MG capsule Take 20 mg by mouth 2 (two) times daily before a meal. Patient not taking: Reported on 02/09/2022    [provider]    Review of Systems  Constitutional:  Positive for fatigue. Negative for appetite change.  Eyes: Negative.   Respiratory:  Positive for cough and shortness of breath. Negative for chest tightness.   Cardiovascular:  Negative for chest pain, palpitations and leg swelling.  Gastrointestinal:  Negative for abdominal distention and abdominal pain.  Endocrine: Negative.   Genitourinary: Negative.   Musculoskeletal:  Positive for arthralgias (hands due to RA). Negative for back pain.  Skin: Negative.   Allergic/Immunologic: Negative.   Neurological:  Positive for dizziness (at times). Negative for light-headedness.  Hematological:  Negative for adenopathy. Does not bruise/bleed  easily.  Psychiatric/Behavioral:  Negative for dysphoric mood and sleep disturbance (sleeping on 1 pillow). The patient is not nervous/anxious.    Vitals:   02/09/22 1439  BP: 119/72  Pulse: 99  Resp: 16  SpO2: 100%  Weight: 166 lb 6 oz (75.5 kg)  Height: 5\' 6"  (1.676 m)   Wt Readings from Last 3 Encounters:  02/09/22 166 lb 6 oz (75.5 kg)  01/28/22 166 lb (75.3 kg)  12/15/21 171 lb 4 oz (77.7 kg)   Lab Results  Component Value Date   CREATININE 0.95 02/01/2022   CREATININE 1.20 (H) 01/30/2022   CREATININE 0.86 01/30/2022   Physical Exam Vitals and nursing note reviewed.  Constitutional:      Appearance: Normal appearance.  HENT:     Head: Normocephalic and atraumatic.  Cardiovascular:     Rate and Rhythm: Normal rate and regular rhythm.  Pulmonary:     Effort: Pulmonary effort is normal. No respiratory distress.     Breath sounds: No wheezing or rales.  Abdominal:     General: There is no distension.     Palpations: Abdomen is soft.  Musculoskeletal:        General: No tenderness.     Cervical back: Normal  range of motion and neck supple.     Right lower leg: No edema.     Left lower leg: No edema.  Skin:    General: Skin is warm and dry.  Neurological:     General: No focal deficit present.     Mental Status: She is alert and oriented to person, place, and time.  Psychiatric:        Mood and Affect: Mood normal.        Behavior: Behavior normal.        Thought Content: Thought content normal.    Assessment & Plan:  1: Chronic heart failure with mildly reduced ejection fraction- - NYHA class III - euvolemic today - weighing daily; reminded to call for an overnight weight gain of > 2 pounds or a weekly weight gain of > 5 pounds - weight down 5 pounds from last visit here 6 weeks ago - not adding salt and does read food labels for sodium content - on GDMT of entresto although only taking daily - reports low BP in the past so may not be able to tolerate other GDMT - BNP 01/28/22 was 686.9 - PharmD reconciled medications with the patient  2: HTN- - BP looks good (119/72) - advised to check BP some days in the morning and some days in the evening to see what the BP looks like in the evening to see if entresto can be increased to BID - saw PCP ([facility]) earlier today & had labs drawn - BMP 02/01/22 reviewed and showed sodium 138, potassium 4.5, creatinine 0.95 & GFR >60  3: Obstructive sleep apnea- - does not wear CPAP  4: RA- - being followed by a rheumatologist Allena Katz) - reports being in remission for years prior to this and her hands hurt worse now than before  5: PE- - saw cardiology ([facility]) 12/13/21 - wearing oxygen at 2L around the clock right now - currently has home health nursing coming in once / week   Medication bottles reviewed.   Return in 1 month, sooner if needed.

## 2022-02-09 NOTE — Progress Notes (Signed)
[facility] - HEART FAILURE CLINIC - PHARMACIST COUNSELING NOTE  Guideline-Directed Medical Therapy/Evidence Based Medicine  ACE/ARB/ARNI: Sacubitril-valsartan 24-26 mg  daily Beta Blocker: None Aldosterone Antagonist: None Diuretic: Furosemide 40 mg daily SGLT2i: None  Adherence Assessment  Do you ever forget to take your medication? [] Yes [x] No  Do you ever skip doses due to side effects? [] Yes [x] No  Do you have trouble affording your medicines? [] Yes [x] No  Are you ever unable to pick up your medication due to transportation difficulties? [] Yes [x] No  Do you ever stop taking your medications because you don't believe they are helping? [] Yes [x] No  Do you check your weight daily? [] Yes [x] No   Adherence strategy: keeps medications contained in bag  Barriers to obtaining medications: none  Vital signs: HR 99, BP 119/72, weight (pounds) 166 lb ECHO: Date 01/29/2022, EF 45-50%, notes mild left ventricular hypertrophy     Latest Ref Rng & Units 02/01/2022    5:54 AM 01/30/2022    4:53 PM 01/30/2022    3:44 AM  BMP  Glucose 70 - 99 mg/dL 202  334  356   BUN 8 - 23 mg/dL 39  46  29   Creatinine 0.44 - 1.00 mg/dL 8.61  6.83  7.29   Sodium 135 - 145 mmol/L 138  137  141   Potassium 3.5 - 5.1 mmol/L 4.5  3.4  3.7   Chloride 98 - 111 mmol/L 111  106  109   CO2 22 - 32 mmol/L 21  21  22    Calcium 8.9 - 10.3 mg/dL 8.1  8.0  7.6     Past Medical History:  Diagnosis Date   Aortic atherosclerosis (HCC)    Asthma    CHF (congestive heart failure) (HCC)    Coronary artery disease    Dyspnea    Edema of both legs    GERD (gastroesophageal reflux disease)    HFrEF (heart failure with reduced ejection fraction) (HCC)    History of 2019 novel coronavirus disease (COVID-19) 10/28/2020   Hypertension    Myocardial infarction (HCC)    OSA (obstructive sleep apnea)    noncompliant with nocturnal PAP therapy   RA (rheumatoid arthritis) [facility])      ASSESSMENT 76 year old female who presents to the HF clinic for follow up. Reports 100% compliance to medications. On isosorbide mononitrate 30 mg daily, furosemide 40 mg once daily, and Entresto 24-26 mg. Taking Entresto once daily. Entresto prescribed once daily previously, but plan at the time was to increase to BID. Patient infrequently checks blood pressure. No known readings in the evening.    PLAN Reconciled medications Patient advised to check blood pressure in evenings. If blood pressure tolerable (> 120/80), consider increasing Entresto frequency to twice daily.  Time spent: 15 minutes  Elliot Gurney, PharmD Pharmacy Resident  02/09/2022 4:03 PM  Current Outpatient Medications:    acetaminophen (TYLENOL) 650 MG CR tablet, Take 1,300 mg by mouth as needed for pain., Disp: , Rfl:    albuterol (PROVENTIL) (2.5 MG/3ML) 0.083% nebulizer solution, Take 2.5 mg by nebulization every 6 (six) hours as needed for wheezing or shortness of breath. (Patient not taking: Reported on 12/15/2021), Disp: , Rfl:    albuterol (VENTOLIN HFA) 108 (90 Base) MCG/ACT inhaler, Inhale 1-2 puffs into the lungs every 6 (six) hours as needed for wheezing or shortness of breath., Disp: , Rfl:    [START ON 03/03/2022] apixaban (ELIQUIS) 5 MG TABS tablet, Take 1 tablet (5  mg total) by mouth 2 (two) times daily., Disp: 60 tablet, Rfl: 1   APIXABAN (ELIQUIS) VTE STARTER PACK (10MG  AND 5MG ), Take as directed on package: start with two-5mg  tablets twice daily for 7 days. On day 8, switch to one-5mg  tablet twice daily. (Patient taking differently: Take as directed on package: start with two-5mg  tablets twice daily for 7 days. On day 8, switch to one-5mg  tablet twice daily.  Started taking 5 mg on 02/09/22), Disp: 1 each, Rfl: 0   atorvastatin (LIPITOR) 40 MG tablet, Take 1 tablet (40 mg total) by mouth every morning., Disp: 90 tablet, Rfl: 0   [facility] VITAMIN B12 1000 MCG TBCR, Take 1,000 mcg by mouth daily., Disp: ,  Rfl:    ergocalciferol (VITAMIN D2) 1.25 MG (50000 UT) capsule, Take 50,000 Units by mouth once a week. Every monday, Disp: , Rfl:    fluticasone (FLONASE) 50 MCG/ACT nasal spray, Place 2 sprays into the nose daily as needed for allergies., Disp: , Rfl:    folic acid (FOLVITE) 1 MG tablet, Take 1 mg by mouth daily., Disp: , Rfl:    furosemide (LASIX) 20 MG tablet, Take 1 tablet (20 mg total) by mouth daily. (Patient taking differently: Take 20 mg by mouth daily. Takes 2 tablets (40 mg) once daily), Disp: 90 tablet, Rfl: 0   HUMIRA PEN 40 MG/0.4ML PNKT, Inject 40 mg into the skin every 14 (fourteen) days. (Patient not taking: Reported on 02/09/2022), Disp: , Rfl:    isosorbide mononitrate (IMDUR) 30 MG 24 hr tablet, Take 30 mg by mouth daily., Disp: , Rfl:    methotrexate (RHEUMATREX) 2.5 MG tablet, Take 7 tablets by mouth every Thursday. (Patient not taking: Reported on 02/09/2022), Disp: , Rfl:    MIRALAX 17 GM/SCOOP powder, Take 17 g by mouth daily as needed for moderate constipation. Dissolve 17 gm in 8 ounces of water and drink, Disp: , Rfl:    montelukast (SINGULAIR) 10 MG tablet, Take 10 mg by mouth at bedtime., Disp: , Rfl:    omeprazole (PRILOSEC) 20 MG capsule, Take 20 mg by mouth 2 (two) times daily before a meal. (Patient not taking: Reported on 02/09/2022), Disp: , Rfl:    sacubitril-valsartan (ENTRESTO) 24-26 MG, Take 1 tablet by mouth daily., Disp: 90 tablet, Rfl: 0   COUNSELING POINTS/CLINICAL PEARLS   DRUGS TO CAUTION IN HEART FAILURE  Drug or Class Mechanism  Analgesics NSAIDs COX-2 inhibitors Glucocorticoids  Sodium and water retention, increased systemic vascular resistance, decreased response to diuretics   Diabetes Medications Metformin Thiazolidinediones Rosiglitazone (Avandia) Pioglitazone (Actos) DPP4 Inhibitors Saxagliptin (Onglyza) Sitagliptin (Januvia)   Lactic acidosis Possible calcium channel blockade   Unknown  Antiarrhythmics Class I   Flecainide Disopyramide Class III Sotalol Other Dronedarone  Negative inotrope, proarrhythmic   Proarrhythmic, beta blockade  Negative inotrope  Antihypertensives Alpha Blockers Doxazosin Calcium Channel Blockers Diltiazem Verapamil Nifedipine Central Alpha Adrenergics Moxonidine Peripheral Vasodilators Minoxidil  Increases renin and aldosterone  Negative inotrope    Possible sympathetic withdrawal  Unknown  Anti-infective Itraconazole Amphotericin B  Negative inotrope Unknown  Hematologic Anagrelide Cilostazol   Possible inhibition of PD IV Inhibition of PD III causing arrhythmias  Neurologic/Psychiatric Stimulants Anti-Seizure Drugs Carbamazepine Pregabalin Antidepressants Tricyclics Citalopram Parkinsons Bromocriptine Pergolide Pramipexole Antipsychotics Clozapine Antimigraine Ergotamine Methysergide Appetite suppressants Bipolar Lithium  Peripheral alpha and beta agonist activity  Negative inotrope and chronotrope Calcium channel blockade  Negative inotrope, proarrhythmic Dose-dependent QT prolongation  Excessive serotonin activity/valvular damage Excessive serotonin activity/valvular damage Unknown  IgE mediated  hypersensitivy, calcium channel blockade  Excessive serotonin activity/valvular damage Excessive serotonin activity/valvular damage Valvular damage  Direct myofibrillar degeneration, adrenergic stimulation  Antimalarials Chloroquine Hydroxychloroquine Intracellular inhibition of lysosomal enzymes  Urologic Agents Alpha Blockers Doxazosin Prazosin Tamsulosin Terazosin  Increased renin and aldosterone  Adapted from Page Williemae Natter, et al. "Drugs That May Cause or Exacerbate Heart Failure: A Scientific Statement from the American Heart  Association." Circulation 2016; 134:e32-e69. DOI: 10.1161/CIR.0000000000000426   MEDICATION ADHERENCES TIPS AND STRATEGIES Taking medication as prescribed improves patient outcomes in  heart failure (reduces hospitalizations, improves symptoms, increases survival) Side effects of medications can be managed by decreasing doses, switching agents, stopping drugs, or adding additional therapy. Please let someone in the Heart Failure Clinic know if you have having bothersome side effects so we can modify your regimen. Do not alter your medication regimen without talking to [place].  Medication reminders can help patients remember to take drugs on time. If you are missing or forgetting doses you can try linking behaviors, using pill boxes, or an electronic reminder like an alarm on your phone or an app. Some people can also get automated phone calls as medication reminders.

## 2022-02-13 ENCOUNTER — Encounter: Payer: Self-pay | Admitting: Family Medicine

## 2022-02-21 ENCOUNTER — Inpatient Hospital Stay: Payer: Medicare Other | Attending: Oncology | Admitting: Oncology

## 2022-02-21 ENCOUNTER — Inpatient Hospital Stay: Payer: Medicare Other

## 2022-02-21 ENCOUNTER — Encounter: Payer: Self-pay | Admitting: Oncology

## 2022-02-21 VITALS — BP 91/67 | HR 88 | Temp 96.2°F | Wt 161.0 lb

## 2022-02-21 DIAGNOSIS — I2609 Other pulmonary embolism with acute cor pulmonale: Secondary | ICD-10-CM | POA: Diagnosis present

## 2022-02-21 DIAGNOSIS — D649 Anemia, unspecified: Secondary | ICD-10-CM | POA: Diagnosis not present

## 2022-02-21 DIAGNOSIS — Z87891 Personal history of nicotine dependence: Secondary | ICD-10-CM | POA: Insufficient documentation

## 2022-02-21 DIAGNOSIS — D7589 Other specified diseases of blood and blood-forming organs: Secondary | ICD-10-CM | POA: Insufficient documentation

## 2022-02-21 NOTE — Progress Notes (Signed)
[facility] CONSULT NOTE  REFERRING PROVIDER: Oswaldo Conroy, MD   Patient Care Team: Oswaldo Conroy, MD as PCP - General (Family Medicine)  ASSESSMENT & PLAN:  Acute pulmonary embolism with acute cor pulmonale (HCC) Unprovoked pulmonary embolism Recommend patient to continue Eliquis 5mg  BID for 6 months, and decrease to Eliquis 2.5mg  BID after that I will check hypercoagulable work up at next visit.  Recommend age appropriate cancer screening.    Normocytic anemia Hemoglobin is stable, close to baseline.    Orders Placed This Encounter  Procedures   Comprehensive metabolic panel    Standing Status:   Future    Standing Expiration Date:   02/22/2023   CBC with Differential/Platelet    Standing Status:   Future    Standing Expiration Date:   02/22/2023   ANTIPHOSPHOLIPID SYNDROME PROF    Standing Status:   Future    Standing Expiration Date:   02/22/2023   Prothrombin gene mutation    Standing Status:   Future    Standing Expiration Date:   02/22/2023   Factor 5 leiden    Standing Status:   Future    Standing Expiration Date:   02/22/2023   Protein S, total and free    Standing Status:   Future    Standing Expiration Date:   02/22/2023   Protein C activity    Standing Status:   Future    Standing Expiration Date:   02/22/2023   Antithrombin III    Standing Status:   Future    Standing Expiration Date:   02/22/2023   Beta-2-glycoprotein i abs, IgG/M/A    Standing Status:   Future    Standing Expiration Date:   02/22/2023      All questions were answered. The patient knows to call the clinic with any problems, questions or concerns. No barriers to learning was detected.  02/24/2023, MD 02/21/2022   CHIEF COMPLAINTS/PURPOSE OF CONSULTATION:  Pulmonary Embolism  HISTORY OF PRESENTING ILLNESS:  Tina Fields 76 y.o. female is referred to establish care for evaluation of pulmonary embolism.   01/28/22 - 02/01/22 patient presented for  evaluation of SOB. She was found to be hypoxic.  Work up included CTa which was positive for acute pulmonary embolism,  CT angio chest 01/28/22: Positive for acute PE with CT evidence of right heart strain (RV/LV ratio: 1.06) consistent with submassive PE. Extensive bilateral ground glass opacities with underlying changes of COPD. In the presence of interval mediastinal and bilateral hilar adenopathy, this is most likely infectious in origin with associated reactive adenopathy. Cardiomegaly. Previously demonstrated liver and bilateral renal cysts. Hiatal hernia. Patient denies any triggering immobilization factors. She was started on heparin gtt and transitioned to Elqiuis at discharge.  Venous Doppler did not show DVT in lower extremities.  She was also treated with community pneumonia.   Today she reports SOB is better, still not back to baseline yet, worse wit exertion. No previous history of thrombosis. Denies any family history of thrombosis.  Family history of lung cancer - paternal aunt.  Appetite is fair. She is accompanied by cousin.   MEDICAL HISTORY:  Past Medical History:  Diagnosis Date   Aortic atherosclerosis (HCC)    Asthma    CHF (congestive heart failure) (HCC)    Coronary artery disease    Dyspnea    Edema of both legs    GERD (gastroesophageal reflux disease)    HFrEF (heart failure with reduced ejection fraction) (HCC)  History of 2019 novel coronavirus disease (COVID-19) 10/28/2020   Hypertension    Myocardial infarction (HCC)    OSA (obstructive sleep apnea)    noncompliant with nocturnal PAP therapy   RA (rheumatoid arthritis) ([place])     SURGICAL HISTORY: Past Surgical History:  Procedure Laterality Date   BREAST SURGERY     CARDIAC CATHETERIZATION Left 06/21/2005   Moderate ostial LCx and LAD disease; LVEF 45%; Location: [place]; Surgeon: Neoma Laming, MD   CARDIAC CATHETERIZATION Left 05/20/2008   No occlusive CAD; LVEF 65%; Location: [facility]; Surgeon: Katrine Coho, MD   COLONOSCOPY     ESOPHAGOGASTRODUODENOSCOPY     TUBAL LIGATION      SOCIAL HISTORY: Social History   Socioeconomic History   Marital status: Widowed    Spouse name: Not on file   Number of children: Not on file   Years of education: Not on file   Highest education level: Not on file  Occupational History   Not on file  Tobacco Use   Smoking status: Former    Types: Cigarettes    Quit date: 01/09/1995    Years since quitting: 27.1   Smokeless tobacco: Never  Vaping Use   Vaping Use: Never used  Substance and Sexual Activity   Alcohol use: No   Drug use: No   Sexual activity: Yes    Birth control/protection: None  Other Topics Concern   Not on file  Social History Narrative   Adult son   Social Determinants of Health   Financial Resource Strain: Unknown (08/21/2017)   Overall Financial Resource Strain (CARDIA)    Difficulty of Paying Living Expenses: Patient refused  Food Insecurity: Unknown (08/21/2017)   Hunger Vital Sign    Worried About [place] in the Last Year: Patient refused    [place] in the Last Year: Patient refused  Transportation Needs: Unknown (08/21/2017)   PRAPARE - Transportation    Lack of Transportation (Medical): Patient refused    Lack of Transportation (Non-Medical): Patient refused  Physical Activity: Unknown (08/21/2017)   Exercise Vital Sign    Days of Exercise per Week: Patient refused    Minutes of Exercise per Session: Patient refused  Stress: Unknown (08/21/2017)   [place]    Feeling of Stress : Patient refused  Social Connections: Unknown (08/21/2017)   Social Connection and Isolation Panel [NHANES]    Frequency of Communication with Friends and Family: Patient refused    Frequency of Social Gatherings with Friends and Family: Patient refused    Attends Religious Services: Patient refused    Active Member of Clubs or Organizations:  Patient refused    Attends Archivist Meetings: Patient refused    Marital Status: Patient refused  Intimate Partner Violence: Unknown (08/21/2017)   Humiliation, Afraid, Rape, and Kick questionnaire    Fear of Current or Ex-Partner: Patient refused    Emotionally Abused: Patient refused    Physically Abused: Patient refused    Sexually Abused: Patient refused    FAMILY HISTORY: Family History  Problem Relation Age of Onset   CAD Mother    Diabetes Mother    Hypertension Mother    CAD Father    Diabetes Father    Hypertension Father    Stroke Father     ALLERGIES:  is allergic to ace inhibitors, hydrochlorothiazide, latex, and azithromycin.  MEDICATIONS:  Current Outpatient Medications  Medication Sig Dispense Refill   acetaminophen (  TYLENOL) 650 MG CR tablet Take 1,300 mg by mouth as needed for pain.     albuterol (VENTOLIN HFA) 108 (90 Base) MCG/ACT inhaler Inhale 1-2 puffs into the lungs every 6 (six) hours as needed for wheezing or shortness of breath.     [START ON 03/03/2022] apixaban (ELIQUIS) 5 MG TABS tablet Take 1 tablet (5 mg total) by mouth 2 (two) times daily. 60 tablet 1   atorvastatin (LIPITOR) 40 MG tablet Take 1 tablet (40 mg total) by mouth every morning. 90 tablet 0   [employer] VITAMIN B12 1000 MCG TBCR Take 1,000 mcg by mouth daily.     ergocalciferol (VITAMIN D2) 1.25 MG (50000 UT) capsule Take 50,000 Units by mouth once a week. Every monday     fluticasone (FLONASE) 50 MCG/ACT nasal spray Place 2 sprays into the nose daily as needed for allergies.     folic acid (FOLVITE) 1 MG tablet Take 1 mg by mouth daily.     furosemide (LASIX) 20 MG tablet Take 1 tablet (20 mg total) by mouth daily. (Patient taking differently: Take 20 mg by mouth daily. Takes 2 tablets (40 mg) once daily) 90 tablet 0   isosorbide mononitrate (IMDUR) 30 MG 24 hr tablet Take 30 mg by mouth daily.     MIRALAX 17 GM/SCOOP powder Take 17 g by mouth daily as needed for moderate  constipation. Dissolve 17 gm in 8 ounces of water and drink     montelukast (SINGULAIR) 10 MG tablet Take 10 mg by mouth at bedtime.     sacubitril-valsartan (ENTRESTO) 24-26 MG Take 1 tablet by mouth daily. 90 tablet 0   albuterol (PROVENTIL) (2.5 MG/3ML) 0.083% nebulizer solution Take 2.5 mg by nebulization every 6 (six) hours as needed for wheezing or shortness of breath. (Patient not taking: Reported on 12/15/2021)     HUMIRA PEN 40 MG/0.4ML PNKT Inject 40 mg into the skin every 14 (fourteen) days. (Patient not taking: Reported on 02/09/2022)     methotrexate (RHEUMATREX) 2.5 MG tablet Take 7 tablets by mouth every Thursday. (Patient not taking: Reported on 02/09/2022)     omeprazole (PRILOSEC) 20 MG capsule Take 20 mg by mouth 2 (two) times daily before a meal. (Patient not taking: Reported on 02/09/2022)     No current facility-administered medications for this visit.    Review of Systems  Constitutional:  Positive for fatigue. Negative for appetite change, chills and fever.  HENT:   Negative for hearing loss and voice change.   Eyes:  Negative for eye problems.  Respiratory:  Positive for shortness of breath. Negative for chest tightness and cough.   Cardiovascular:  Negative for chest pain.  Gastrointestinal:  Negative for abdominal distention, abdominal pain and blood in stool.  Endocrine: Negative for hot flashes.  Genitourinary:  Negative for difficulty urinating and frequency.   Musculoskeletal:  Negative for arthralgias.  Skin:  Negative for itching and rash.  Neurological:  Negative for extremity weakness.  Hematological:  Negative for adenopathy.  Psychiatric/Behavioral:  Negative for confusion.      PHYSICAL EXAMINATION: Vitals:   02/21/22 1509  BP: 91/67  Pulse: 88  Temp: (!) 96.2 F (35.7 C)   Filed Weights   02/21/22 1509  Weight: 161 lb (73 kg)    Physical Exam Constitutional:      General: She is not in acute distress.    Appearance: She is not diaphoretic.      Comments: Patient sits in the wheelchair.   HENT:  Head: Normocephalic and atraumatic.     Nose: Nose normal.     Mouth/Throat:     Pharynx: No oropharyngeal exudate.  Eyes:     General: No scleral icterus.    Pupils: Pupils are equal, round, and reactive to light.  Cardiovascular:     Rate and Rhythm: Normal rate and regular rhythm.     Heart sounds: No murmur heard. Pulmonary:     Effort: Pulmonary effort is normal. No respiratory distress.     Comments: She breathes comfortably on nasal cannula oxygen.  Abdominal:     Palpations: Abdomen is soft.  Musculoskeletal:        General: Normal range of motion.     Cervical back: Normal range of motion and neck supple.  Skin:    General: Skin is warm and dry.     Findings: No erythema.  Neurological:     Mental Status: She is alert and oriented to person, place, and time. Mental status is at baseline.     Cranial Nerves: No cranial nerve deficit.     Motor: No abnormal muscle tone.     Coordination: Coordination normal.  Psychiatric:        Mood and Affect: Mood and affect normal.      LABORATORY DATA:  I have reviewed the data as listed Lab Results  Component Value Date   WBC 9.0 02/01/2022   HGB 11.6 (L) 02/01/2022   HCT 36.2 02/01/2022   MCV 96.0 02/01/2022   PLT 256 02/01/2022   Recent Labs    03/02/21 2037 03/03/21 0642 01/28/22 1814 01/29/22 0236 01/29/22 0915 01/30/22 0344 01/30/22 1653 02/01/22 0554  NA 138   < > 141 138   < > 141 137 138  K 3.4*   < > 2.5* 2.7*  2.7*   < > 3.7 3.4* 4.5  CL 103   < > 104 105   < > 109 106 111  CO2 24   < > 27 24   < > 22 21* 21*  GLUCOSE 92   < > 131* 123*   < > 132* 216* 191*  BUN 17   < > 17 14   < > 29* 46* 39*  CREATININE 1.05*   < > 1.02* 0.90   < > 0.86 1.20* 0.95  CALCIUM 8.5*   < > 7.5* 7.4*   < > 7.6* 8.0* 8.1*  GFRNONAA 55*   < > 56* >60   < > >60 46* >60  PROT 7.2  --  6.6 6.6  --   --   --   --   ALBUMIN 2.5*  --  2.7* 2.7*  --   --   --   --    AST 14*  --  21 22  --   --   --   --   ALT 7  --  14 15  --   --   --   --   ALKPHOS 78  --  94 99  --   --   --   --   BILITOT 0.7  --  2.9* 2.7*  --   --   --   --    < > = values in this interval not displayed.    RADIOGRAPHIC STUDIES: I have personally reviewed the radiological images as listed and agreed with the findings in the report. ECHOCARDIOGRAM COMPLETE  Result Date: 01/29/2022    ECHOCARDIOGRAM REPORT   Patient Name:  Tina Fields Date of Exam: 01/29/2022 Medical Rec #:  [id number]             Height:       66.0 in Accession #:    [id number]            Weight:       166.0 lb Date of Birth:  1946/08/20              BSA:          1.848 m Patient Age:    20 years              BP:           149/74 mmHg Patient Gender: F                     HR:           106 bpm. Exam Location:  [facility] Procedure: 2D Echo Indications:     Pulmonary Embolus I26.09  History:         Patient has prior history of Echocardiogram examinations, most                  recent 03/03/2021.  Sonographer:     Kathlen Brunswick RDCS Referring Phys:  [id number] V PATEL Diagnosing Phys: [place]  1. Left ventricular ejection fraction, by estimation, is 45 to 50%. Left ventricular ejection fraction by 2D MOD biplane is 48.1 %. The left ventricle has mildly decreased function. The left ventricle demonstrates regional wall motion abnormalities (see  scoring diagram/findings for description). There is mild left ventricular hypertrophy. Left ventricular diastolic parameters are consistent with Grade I diastolic dysfunction (impaired relaxation).  2. Right ventricular systolic function is mildly reduced. The right ventricular size is mildly enlarged. There is moderately elevated pulmonary artery systolic pressure.  3. The mitral valve is normal in structure. Mild mitral valve regurgitation.  4. The aortic valve is normal in structure. Aortic valve regurgitation is not visualized. No aortic stenosis is present.  5.  Echo density distal to PV. Artifact versus possibly thrombus in setting of PE. Comparison(s): Compared to echo from [facility] in 2022, RV pressures are higher. Per report, EF sounds to be similar, with prior WMA present. FINDINGS  Left Ventricle: Left ventricular ejection fraction, by estimation, is 45 to 50%. Left ventricular ejection fraction by 2D MOD biplane is 48.1 %. The left ventricle has mildly decreased function. The left ventricle demonstrates regional wall motion abnormalities. Mild hypokinesis of the left ventricular, mid-apical anterolateral wall. The left ventricular internal cavity size was normal in size. There is mild left ventricular hypertrophy. Left ventricular diastolic parameters are consistent with Grade I diastolic dysfunction (impaired relaxation). Right Ventricle: The right ventricular size is mildly enlarged. Right vetricular wall thickness was not well visualized. Right ventricular systolic function is mildly reduced. There is moderately elevated pulmonary artery systolic pressure. The tricuspid  regurgitant velocity is 3.66 m/s, and with an assumed right atrial pressure of 5 mmHg, the estimated right ventricular systolic pressure is 0000000 mmHg. Left Atrium: Left atrial size was normal in size. Right Atrium: Right atrial size was normal in size. Pericardium: There is no evidence of pericardial effusion. Mitral Valve: The mitral valve is normal in structure. Mild mitral valve regurgitation. Tricuspid Valve: The tricuspid valve is normal in structure. Tricuspid valve regurgitation is mild. Aortic Valve: The aortic valve is normal in structure. Aortic valve regurgitation is not visualized. No aortic stenosis is present.  Aortic valve peak gradient measures 7.3 mmHg. Pulmonic Valve: Echo density distal to PV. Artifact versus possibly thrombus in setting of PE. The pulmonic valve was not well visualized. Pulmonic valve regurgitation is trivial. No evidence of pulmonic stenosis. Aorta: The aortic root  and ascending aorta are structurally normal, with no evidence of dilitation. Venous: The inferior vena cava was not well visualized. IAS/Shunts: The interatrial septum was not well visualized.  LEFT VENTRICLE PLAX 2D                        Biplane EF (MOD) LVIDd:         3.95 cm         LV Biplane EF:   Left LVIDs:         3.15 cm                          ventricular LV PW:         1.07 cm                          ejection LV IVS:        1.43 cm                          fraction by LVOT diam:     1.80 cm                          2D MOD LV SV:         27                               biplane is LV SV Index:   15                               48.1 %. LVOT Area:     2.54 cm                                Diastology                                LV e' medial:    5.11 cm/s LV Volumes (MOD)               LV E/e' medial:  17.2 LV vol d, MOD    85.9 ml       LV e' lateral:   5.22 cm/s A2C:                           LV E/e' lateral: 16.8 LV vol d, MOD    48.9 ml A4C: LV vol s, MOD    38.2 ml A2C: LV vol s, MOD    26.4 ml A4C: LV SV MOD A2C:   47.7 ml LV SV MOD A4C:   48.9 ml LV SV MOD BP:    32.6 ml RIGHT VENTRICLE RV Basal diam:  3.66 cm RV S prime:     10.70 cm/s TAPSE (M-mode): 1.6 cm LEFT ATRIUM  Index        RIGHT ATRIUM           Index LA diam:        4.50 cm 2.44 cm/m   RA Area:     16.60 cm LA Vol (A2C):   55.4 ml 29.99 ml/m  RA Volume:   44.40 ml  24.03 ml/m LA Vol (A4C):   45.4 ml 24.57 ml/m LA Biplane Vol: 54.8 ml 29.66 ml/m  AORTIC VALVE                 PULMONIC VALVE AV Area (Vmax): 1.54 cm     PV Vmax:          0.94 m/s AV Vmax:        135.00 cm/s  PV Peak grad:     3.6 mmHg AV Peak Grad:   7.3 mmHg     PR End Diast Vel: 8.29 msec LVOT Vmax:      81.60 cm/s LVOT Vmean:     54.200 cm/s LVOT VTI:       0.108 m  AORTA Ao Root diam: 3.00 cm Ao Asc diam:  2.90 cm MITRAL VALVE               TRICUSPID VALVE MV Area (PHT): 4.74 cm    TV Peak grad:   36.7 mmHg MV Decel Time: 160 msec    TV Vmax:         3.03 m/s MV E velocity: 87.80 cm/s  TR Peak grad:   53.6 mmHg                            TR Vmax:        366.00 cm/s                             SHUNTS                            Systemic VTI:  0.11 m                            Systemic Diam: 1.80 cm Sena Slate Electronically signed by Sena Slate Signature Date/Time: 01/29/2022/11:03:27 AM    Final    US Venous Img Lower Bilateral (DVT)  Result Date: 01/29/2022 CLINICAL DATA:  Pulmonary embolism EXAM: BILATERAL LOWER EXTREMITY VENOUS DOPPLER ULTRASOUND TECHNIQUE: Gray-scale sonography with compression, as well as color and duplex ultrasound, were performed to evaluate the deep venous system(s) from the level of the common femoral vein through the popliteal and proximal calf veins. COMPARISON:  03/02/2021 FINDINGS: VENOUS Normal compressibility of the common femoral, superficial femoral, and popliteal veins, as well as the visualized calf veins. Visualized portions of profunda femoral vein and great saphenous vein unremarkable. No filling defects to suggest DVT on grayscale or color Doppler imaging. Doppler waveforms show normal direction of venous flow, normal respiratory plasticity and response to augmentation. OTHER None. Limitations: none IMPRESSION: No lower extremity DVT Electronically Signed   By: Acquanetta Belling M.D.   On: 01/29/2022 06:09   CT Angio Chest PE W and/or Wo Contrast  Addendum Date: 01/28/2022   ADDENDUM REPORT: 01/28/2022 20:55 ADDENDUM: Critical Value/emergent results were called by telephone at the time of interpretation on 01/28/2022 at 8:48 pm to provider MARK QUALE , who verbally  acknowledged these results. Electronically Signed   By: Claudie Revering M.D.   On: 01/28/2022 20:55   Result Date: 01/28/2022 CLINICAL DATA:  Shortness of breath for 1 day.  Hypoxia. EXAM: CT ANGIOGRAPHY CHEST WITH CONTRAST TECHNIQUE: Multidetector CT imaging of the chest was performed using the standard protocol during bolus administration of intravenous  contrast. Multiplanar CT image reconstructions and MIPs were obtained to evaluate the vascular anatomy. RADIATION DOSE REDUCTION: This exam was performed according to the departmental dose-optimization program which includes automated exposure control, adjustment of the mA and/or kV according to patient size and/or use of iterative reconstruction technique. CONTRAST:  75mL OMNIPAQUE IOHEXOL 350 MG/ML SOLN COMPARISON:  Portable chest obtained earlier today. Chest CT dated 11/25/2010. Abdomen and pelvis CT dated 03/04/2021 and limited right upper quadrant abdomen ultrasound dated 03/04/2021. FINDINGS: Cardiovascular: Enlarged heart. Atheromatous calcifications, including the coronary arteries and aorta. Minimal pericardial effusion. Multiple moderate-sized pulmonary emboli on the right. The right ventricular to left ventricular ratio is 1.06. Mediastinum/Nodes: Interval multiple mildly enlarged mediastinal and bilateral hilar lymph nodes. Mildly prominent left axillary lymph nodes without significant change. Small to moderate-sized hiatal hernia. Normal appearing thyroid gland. Lungs/Pleura: Interval mosaic pattern of ground-glass opacity throughout both lungs. Mild peripheral patchy consolidation in both upper lobes. Bilateral paraseptal bullous changes. No pleural fluid. Upper Abdomen: Previously demonstrated multiple liver cysts. Partially included previously demonstrated bilateral upper pole cysts, including a large cyst indenting into the right lobe of the liver. Musculoskeletal: Thoracic and lower cervical spine degenerative changes. Review of the MIP images confirms the above findings. IMPRESSION: 1. Positive for acute PE with CTevidence of right heart strain (RV/LV Ratio = 1.06) consistent with at least submassive (intermediate risk) PE. The presence of right heart strain has been associated with an increased risk of morbidity and mortality. 2. Extensive bilateral was a ground-glass opacities with underlying  changes of COPD. In the presence of interval mediastinal and bilateral hilar adenopathy, this is most likely infectious in origin with associated reactive adenopathy. 3. Cardiomegaly. 4. Previously demonstrated liver and bilateral renal cysts. 5.  Calcific coronary artery and aortic atherosclerosis. 6. Small to moderate-sized hiatal hernia. Aortic Atherosclerosis (ICD10-I70.0) and Emphysema (ICD10-J43.9). Electronically Signed: By: Claudie Revering M.D. On: 01/28/2022 20:46   DG Chest Port 1 View  Result Date: 01/28/2022 CLINICAL DATA:  Questionable sepsis. EXAM: PORTABLE CHEST 1 VIEW COMPARISON:  AP chest 12/02/2021, CT chest 11/25/2010, chest two views 09/27/2021 FINDINGS: Cardiac silhouette is again mildly enlarged. Moderate calcification is again seen within the aortic arch. Chronic moderate bilateral interstitial thickening is similar to 12/02/2021 and 09/27/2021. This is again greatest within the lung bases. No definite pleural effusion. No pneumothorax. No acute skeletal abnormality. IMPRESSION: Moderate bilateral interstitial thickening is unchanged from multiple prior radiographs, likely chronic interstitial lung disease. No definite pneumonia. Electronically Signed   By: Yvonne Kendall M.D.   On: 01/28/2022 18:25

## 2022-02-21 NOTE — Assessment & Plan Note (Addendum)
Unprovoked pulmonary embolism Recommend patient to continue Eliquis 5mg  BID for 6 months, and decrease to Eliquis 2.5mg  BID after that I will check hypercoagulable work up at next visit.  Recommend age appropriate cancer screening.

## 2022-02-21 NOTE — Assessment & Plan Note (Signed)
Hemoglobin is stable, close to baseline.

## 2022-03-02 ENCOUNTER — Emergency Department
Admission: EM | Admit: 2022-03-02 | Discharge: 2022-03-02 | Disposition: A | Discharge status: Home / Self Care | Payer: Medicare Other | Attending: Emergency Medicine | Admitting: Emergency Medicine

## 2022-03-02 ENCOUNTER — Other Ambulatory Visit: Payer: Self-pay

## 2022-03-02 ENCOUNTER — Emergency Department: Payer: Medicare Other

## 2022-03-02 DIAGNOSIS — I509 Heart failure, unspecified: Secondary | ICD-10-CM | POA: Diagnosis not present

## 2022-03-02 DIAGNOSIS — Z7901 Long term (current) use of anticoagulants: Secondary | ICD-10-CM | POA: Insufficient documentation

## 2022-03-02 DIAGNOSIS — I110 Hypertensive heart disease with heart failure: Secondary | ICD-10-CM | POA: Insufficient documentation

## 2022-03-02 DIAGNOSIS — I2510 Atherosclerotic heart disease of native coronary artery without angina pectoris: Secondary | ICD-10-CM | POA: Insufficient documentation

## 2022-03-02 DIAGNOSIS — J45909 Unspecified asthma, uncomplicated: Secondary | ICD-10-CM | POA: Insufficient documentation

## 2022-03-02 DIAGNOSIS — M2550 Pain in unspecified joint: Secondary | ICD-10-CM | POA: Diagnosis present

## 2022-03-02 DIAGNOSIS — J449 Chronic obstructive pulmonary disease, unspecified: Secondary | ICD-10-CM | POA: Diagnosis not present

## 2022-03-02 DIAGNOSIS — M069 Rheumatoid arthritis, unspecified: Secondary | ICD-10-CM | POA: Insufficient documentation

## 2022-03-02 LAB — CBC WITH DIFFERENTIAL/PLATELET
Abs Immature Granulocytes: 0.02 10*3/uL (ref 0.00–0.07)
Basophils Absolute: 0 10*3/uL (ref 0.0–0.1)
Basophils Relative: 0 %
Eosinophils Absolute: 0 10*3/uL (ref 0.0–0.5)
Eosinophils Relative: 0 %
HCT: 37.1 % (ref 36.0–46.0)
Hemoglobin: 11.6 g/dL — ABNORMAL LOW (ref 12.0–15.0)
Immature Granulocytes: 0 %
Lymphocytes Relative: 23 %
Lymphs Abs: 1.3 10*3/uL (ref 0.7–4.0)
MCH: 31.1 pg (ref 26.0–34.0)
MCHC: 31.3 g/dL (ref 30.0–36.0)
MCV: 99.5 fL (ref 80.0–100.0)
Monocytes Absolute: 0.6 10*3/uL (ref 0.1–1.0)
Monocytes Relative: 11 %
Neutro Abs: 3.6 10*3/uL (ref 1.7–7.7)
Neutrophils Relative %: 66 %
Platelets: 222 10*3/uL (ref 150–400)
RBC: 3.73 MIL/uL — ABNORMAL LOW (ref 3.87–5.11)
RDW: 15.8 % — ABNORMAL HIGH (ref 11.5–15.5)
WBC: 5.5 10*3/uL (ref 4.0–10.5)
nRBC: 0 % (ref 0.0–0.2)

## 2022-03-02 LAB — BASIC METABOLIC PANEL
Anion gap: 8 (ref 5–15)
BUN: 21 mg/dL (ref 8–23)
CO2: 24 mmol/L (ref 22–32)
Calcium: 9 mg/dL (ref 8.9–10.3)
Chloride: 107 mmol/L (ref 98–111)
Creatinine, Ser: 0.82 mg/dL (ref 0.44–1.00)
GFR, Estimated: >60 mL/min (ref >60)
Glucose, Bld: 118 mg/dL — ABNORMAL HIGH (ref 70–99)
Potassium: 3.4 mmol/L — ABNORMAL LOW (ref 3.5–5.1)
Sodium: 139 mmol/L (ref 135–145)

## 2022-03-02 MED ORDER — ACETAMINOPHEN 10 MG/ML IV SOLN
1000.0000 mg | Freq: Once | INTRAVENOUS | Status: AC
Start: 2022-03-02 — End: 2022-03-02
  Administered 2022-03-02: 1000 mg via INTRAVENOUS

## 2022-03-02 NOTE — ED Provider Notes (Signed)
[facility] Provider Note    Event Date/Time   First MD Initiated Contact with Patient 03/02/22 [phone]     (approximate)   History   Arthritis   HPI  Tina Fields is a 76 y.o. female with a past medical history of recent admission for pulmonary embolism with right heart strain currently on Eliquis and 2 L home oxygen, heart failure, coronary artery disease, rheumatoid arthritis who presents today for for evaluation of joint pain.  Patient reports that she was taken off of her methotrexate due to her pulmonary embolism and medicine interactions, and feels like her normal locations of her joint pains have gotten worse.  She reports that specifically she has pain in her bilateral hands, her left knee, and her left second toe.  She reports that the pain normally gets better with the methotrexate but she has not been taking anything for her pain recently.  She denies any falls or injuries.  She has not had any fevers or chills.  She has not noticed any skin color changes or warmth to her joints.  She reports that overall her breathing is improving significantly.  She does not currently feel short of breath.  She denies chest pain.  She has not noticed any swelling in her legs.  Patient Active Problem List   Diagnosis Date Noted   Normocytic anemia 02/21/2022   Physical deconditioning 02/01/2022   Pyuria 01/29/2022   AKI (acute kidney injury) (HCC) - mild 01/29/2022   Acute pulmonary embolism with acute cor pulmonale (HCC) 01/28/2022   Acute respiratory failure with hypoxia (HCC) 01/28/2022   CAP (community acquired pneumonia) 01/28/2022   Acute diastolic CHF (congestive heart failure) (HCC) 12/02/2021   CAD 03/15/2021   Renal mass 03/15/2021   Elevated troponin 03/03/2021   NSTEMI (non-ST elevated myocardial infarction) (HCC) 03/02/2021   Acute on chronic combined CHF 01/02/2021   OSA (obstructive sleep apnea) 10/28/2020   Vertigo 01/14/2020   Encounter  for long-term (current) use of high-risk medication 02/18/2019   Mixed hyperlipidemia 12/03/2018   Prediabetes 09/03/2018   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   Hypotension 08/21/2017   GERD (gastroesophageal reflux disease) 08/21/2017   Nodule of right lung 05/21/2014   Rheumatoid arthritis, unspecified (HCC) 06/09/2011   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 --      Excl. in GC? --     Most recent vital signs: Vitals:   03/02/22 0707  BP: (!) 142/70  Pulse: 91  Resp: 17  Temp: 97.7 F (36.5 C)  SpO2: 99%    Physical Exam Vitals and nursing note reviewed.  Constitutional:      General: Awake and alert. No acute distress.    Appearance: Normal appearance. The patient  is normal weight.  HENT:     Head: Normocephalic and atraumatic.     Mouth: Mucous membranes are moist.  Eyes:     General: PERRL. Normal EOMs        Right eye: No discharge.        Left eye: No discharge.     Conjunctiva/sclera: Conjunctivae normal.  Cardiovascular:     Rate and Rhythm: Normal rate and regular rhythm.     Pulses: Normal pulses.     Heart sounds: Normal heart sounds Pulmonary:     Effort: Pulmonary effort is normal. No respiratory distress.  On 2 L nasal cannula, speaking easily in complete sentences    Breath sounds: Normal breath sounds.  Abdominal:     Abdomen is soft. There is no abdominal tenderness. No rebound or guarding. No distention. Musculoskeletal:        General: No swelling. Normal range of motion.     Cervical back: Normal range of motion and neck  supple.  No midline cervical spine tenderness, full range of motion of neck. Bilateral hands with boutonniere deformity at the MCP with ulnar deviation, no warmth erythema.  No wrist tenderness, forearm tenderness, elbow tenderness, or shoulder tenderness bilaterally. Left knee without deformity, ecchymosis, erythema, or warmth.  Able to actively flex and extend at knee fully, though reports pain with doing so. Bilateral feet normal-appearing with the exception of the left second toe with slightly curled toe with tenderness to palpation. Skin:    General: Skin is warm and dry.     Capillary Refill: Capillary refill takes less than 2 seconds.     Findings: No rash.  Neurological:     Mental Status: The patient is awake and alert.      ED Results / Procedures / Treatments   Labs (all labs ordered are listed, but only abnormal results are displayed) Labs Reviewed  CBC WITH DIFFERENTIAL/PLATELET - Abnormal; Notable for the following components:      Result Value   RBC 3.73 (*)    Hemoglobin 11.6 (*)    RDW 15.8 (*)    All other components within normal limits  BASIC METABOLIC PANEL - Abnormal; Notable for the following components:   Potassium 3.4 (*)    Glucose, Bld 118 (*)    All other components within normal limits     EKG     RADIOLOGY I independently reviewed and interpreted imaging and agree with radiologists findings.     PROCEDURES:  Critical Care performed:   Procedures   MEDICATIONS ORDERED IN ED: Medications  acetaminophen (OFIRMEV) IV 1,000 mg (0 mg Intravenous Stopped 03/02/22 0851)     IMPRESSION / MDM / ASSESSMENT AND PLAN / ED COURSE  I reviewed the triage vital signs and the nursing notes.   Differential diagnosis includes, but is not limited to, rheumatoid arthritis flare, osteoarthritis, no warmth or erythema or constitutional symptoms to suggest septic joint, no injury to suggest fracture.  Patient is awake and alert, hemodynamically stable  and afebrile.  She reports that her locations of her pain are her baseline locations for her rheumatoid arthritis.  There are no constitutional symptoms.  With regards to her pulmonary embolism, patient is currently on her 2 L nasal cannula and reports that her breathing has significantly improved.  She denies any pleurisy.  There is no lower extremity edema.  She does not look overtly volume overloaded.  I do not suspect worsening embolism burden given that her breathing and pain have  both improved significantly.  I suspect her current flare is in the setting of being taken off of her methotrexate.  Patient reports that she missed her rheumatoid arthritis appointment because she was hospitalized.  She has yet to reschedule this.  Labs are overall reassuring.  X-rays are consistent with rheumatoid arthritis without acute injury.  She was treated with Tylenol with significant improvement of her symptoms.  She was advised that she may continue to take Tylenol 650 mg every 6-8 hours at home.  I advised that she follow-up with her rheumatologist to discuss other possible DMARD therapy or when she may resume her methotrexate.  We discussed return precautions and the importance of close outpatient follow-up.  Patient understands and agrees with plan.  She was discharged in stable condition in the care of her family member.   Patient's presentation is most consistent with severe exacerbation of chronic illness.    Clinical Course as of 03/02/22 1010  Thu Mar 02, 2022  0901 Patient reports that she feels improved and is ready to go home [JP]    Clinical Course User Index [JP] Maryjane Benedict, Clarnce Flock, PA-C     FINAL CLINICAL IMPRESSION(S) / ED DIAGNOSES   Final diagnoses:  Arthralgia, unspecified joint  Rheumatoid arthritis, involving unspecified site, unspecified whether rheumatoid factor present ([place])     Rx / DC Orders   ED Discharge Orders     None        Note:  This document was prepared using  Dragon voice recognition software and may include unintentional dictation errors.   Emeline Gins 03/02/22 1010    Carrie Mew, MD 03/03/22 [phone]

## 2022-03-02 NOTE — ED Triage Notes (Signed)
Pt comes into the ED via EMS from home with c/o arthritis acting up, hands, left knee and left foot for the past several days, recently here with PE and is currently on oxygen, states they took her off her arthritis meds then and since having increased joint pain.  142/70 HR 84 99% on 2L [place]

## 2022-03-02 NOTE — Discharge Instructions (Signed)
You may continue to take Tylenol 650 mg every 6-8 hours for pain.  Please follow-up with your rheumatologist for discussion about how you may be started back on your rheumatoid arthritis therapy.  Please return for any new, worsening, or change in symptoms or other concerns.  It was a pleasure caring for you today.

## 2022-03-02 NOTE — ED Notes (Addendum)
Pt states her arthritis had been acting up since being off meds for about 3 weeks. Pt states they took her off after seeing what they think was glass in her stomach from xray.

## 2022-03-12 NOTE — Progress Notes (Unsigned)
Patient ID: Tina Fields, female    DOB: 11/23/1945, 76 y.o.   MRN: [id number]  HPI  Tina Fields is a 76 y/o female with a history of asthma, CAD, HTN, aortic atherosclerosis, asthma, GERD, obstructive sleep apnea, RA, previous tobacco use and chronic heart failure.   Echo report from 01/29/22 reviewed and showed an EF of 45-50% along with mild LVH, moderately elevated PA pressure and mild MR. Echo report from 05/03/21 reviewed and showed an EF of 45-50% with moderate LAE.   Was in the ED 03/02/22 due to joint pain where she was evaluated and released. Admitted 01/28/22 due to productive cough, worsening DOE, fatigue and nausea. Right lung PE and CAP. Vascular surgery consulted. IV heparin started. Negative DVT. Antibiotics given for pneumonia. Given IVF with subsequent respiratory distress. IVF stopped and IV lasix given. Potassium and magnesium supplemented. Discharged after 4 days. Admitted 12/02/21 due to SOB due to HF. Cardiology consult obtained. Initially given IV lasix with transition to oral diuretics. Discharged after 3 days.   She presents today for a follow-up visit with a chief complaint of moderate fatigue with minimal exertion. Describes this as chronic in nature. She has associated cough, shortness of breath, pedal edema and chronic pain along with this. She denies any difficulty sleeping, dizziness, chest pain, palpitations, abdominal distention or weight gain.   She says that her biggest complaint is of her RA flare. She says that she was taking methotrexate but it had been stopped during her May 2023 admission and her RA pain has been worsening since that time. Currently taking tylenol without any relief. Does not have an appointment with her rheumatologist until August. She has pain in her neck, hands and knees.   Past Medical History:  Diagnosis Date   Aortic atherosclerosis (HCC)    Asthma    CHF (congestive heart failure) (HCC)    Coronary artery disease    Dyspnea     Edema of both legs    GERD (gastroesophageal reflux disease)    HFrEF (heart failure with reduced ejection fraction) (HCC)    History of 2019 novel coronavirus disease (COVID-19) 10/28/2020   Hypertension    Myocardial infarction (HCC)    OSA (obstructive sleep apnea)    noncompliant with nocturnal PAP therapy   RA (rheumatoid arthritis) (HCC)    Past Surgical History:  Procedure Laterality Date   BREAST SURGERY     CARDIAC CATHETERIZATION Left 06/21/2005   Moderate ostial LCx and LAD disease; LVEF 45%; Location: [facility]; Surgeon: Adrian Blackwater, MD   CARDIAC CATHETERIZATION Left 05/20/2008   No occlusive CAD; LVEF 65%; Location: [facility]; Surgeon: Rudean Hitt, MD   COLONOSCOPY     ESOPHAGOGASTRODUODENOSCOPY     TUBAL LIGATION     Family History  Problem Relation Age of Onset   CAD Mother    Diabetes Mother    Hypertension Mother    CAD Father    Diabetes Father    Hypertension Father    Stroke Father    Social History   Tobacco Use   Smoking status: Former    Types: Cigarettes    Quit date: 01/09/1995    Years since quitting: 27.1   Smokeless tobacco: Never  Substance Use Topics   Alcohol use: No   Allergies  Allergen Reactions   Ace Inhibitors Cough    Other reaction(s): Cough    Hydrochlorothiazide Other (See Comments)    Cramps   Latex Hives   Azithromycin Rash   Prior  to Admission medications   Medication Sig Start Date End Date Taking? Authorizing Provider  acetaminophen (TYLENOL) 650 MG CR tablet Take 1,300 mg by mouth as needed for pain.   Yes [provider]  albuterol (VENTOLIN HFA) 108 (90 Base) MCG/ACT inhaler Inhale 1-2 puffs into the lungs every 6 (six) hours as needed for wheezing or shortness of breath. 11/06/20  Yes [provider]  apixaban (ELIQUIS) 5 MG TABS tablet Take 1 tablet (5 mg total) by mouth 2 (two) times daily. 03/03/22  Yes Almon Hercules, MD  atorvastatin (LIPITOR) 40 MG tablet Take 1 tablet (40 mg total) by mouth every  morning. 12/05/21  Yes Darlin Drop, DO  [facility] VITAMIN B12 1000 MCG TBCR Take 1,000 mcg by mouth daily. 12/25/21  Yes [provider]  ergocalciferol (VITAMIN D2) 1.25 MG (50000 UT) capsule Take 50,000 Units by mouth once a week. Every monday   Yes [provider]  fluticasone (FLONASE) 50 MCG/ACT nasal spray Place 2 sprays into the nose daily as needed for allergies. 12/03/18  Yes [provider]  folic acid (FOLVITE) 1 MG tablet Take 1 mg by mouth daily. 09/19/21  Yes [provider]  furosemide (LASIX) 20 MG tablet Take 1 tablet (20 mg total) by mouth daily. 12/05/21  Yes Darlin Drop, DO  isosorbide mononitrate (IMDUR) 30 MG 24 hr tablet Take 30 mg by mouth daily.   Yes [provider]  MIRALAX 17 GM/SCOOP powder Take 17 g by mouth daily as needed for moderate constipation. Dissolve 17 gm in 8 ounces of water and drink 01/11/21  Yes [provider]  montelukast (SINGULAIR) 10 MG tablet Take 10 mg by mouth at bedtime.   Yes [provider]  sacubitril-valsartan (ENTRESTO) 24-26 MG Take 1 tablet by mouth daily. 02/05/22 05/06/22 Yes Almon Hercules, MD  albuterol (PROVENTIL) (2.5 MG/3ML) 0.083% nebulizer solution Take 2.5 mg by nebulization every 6 (six) hours as needed for wheezing or shortness of breath. Patient not taking: Reported on 03/13/2022    [provider]  HUMIRA PEN 40 MG/0.4ML PNKT Inject 40 mg into the skin every 14 (fourteen) days. Patient not taking: Reported on 03/13/2022 01/16/22   [provider]   Review of Systems  Constitutional:  Positive for fatigue. Negative for appetite change.  HENT:  Negative for congestion, postnasal drip and sore throat.   Eyes: Negative.   Respiratory:  Positive for cough and shortness of breath. Negative for chest tightness.   Cardiovascular:  Positive for leg swelling. Negative for chest pain and palpitations.  Gastrointestinal:  Negative for abdominal distention and abdominal  pain.  Endocrine: Negative.   Genitourinary: Negative.   Musculoskeletal:  Positive for arthralgias (hands/ knees due to RA) and neck pain. Negative for back pain.  Skin: Negative.   Allergic/Immunologic: Negative.   Neurological:  Negative for dizziness and light-headedness.  Hematological:  Negative for adenopathy. Does not bruise/bleed easily.  Psychiatric/Behavioral:  Negative for dysphoric mood and sleep disturbance (sleeping on 1 pillow). The patient is not nervous/anxious.    Vitals:   03/13/22 1522  BP: 138/76  Pulse: 91  Resp: 18  SpO2: 99%  Height: 5\' 6"  (1.676 m)   Wt Readings from Last 3 Encounters:  02/21/22 161 lb (73 kg)  02/09/22 166 lb 6 oz (75.5 kg)  01/28/22 166 lb (75.3 kg)   Lab Results  Component Value Date   CREATININE 0.82 03/02/2022   CREATININE 0.95 02/01/2022   CREATININE 1.20 (  H) 01/30/2022   Physical Exam Vitals and nursing note reviewed.  Constitutional:      Appearance: Normal appearance.  HENT:     Head: Normocephalic and atraumatic.  Cardiovascular:     Rate and Rhythm: Normal rate and regular rhythm.  Pulmonary:     Effort: Pulmonary effort is normal. No respiratory distress.     Breath sounds: No wheezing or rales.  Abdominal:     General: There is no distension.     Palpations: Abdomen is soft.  Musculoskeletal:        General: Deformity (knuckles on hands due to RA) present. No tenderness.     Cervical back: Normal range of motion and neck supple.     Right lower leg: No edema.     Left lower leg: No edema.  Skin:    General: Skin is warm and dry.  Neurological:     General: No focal deficit present.     Mental Status: She is alert and oriented to person, place, and time.  Psychiatric:        Mood and Affect: Mood normal.        Behavior: Behavior normal.        Thought Content: Thought content normal.   Assessment & Plan:  1: Chronic heart failure with mildly reduced ejection fraction- - NYHA class III - euvolemic  today - weighing daily; reminded to call for an overnight weight gain of > 2 pounds or a weekly weight gain of > 5 pounds - weight 166.6 pounds at last visit here 1 month ago; patient declined to weigh today - not adding salt and does read food labels for sodium content - on GDMT of entresto although continues to take it daily because she's concerned about her BP getting to low - reports low BP in the past so may not be able to tolerate other GDMT - BNP 01/28/22 was 686.9  2: HTN- - BP mildly elevated (138/76); has been low in the past - reports being in a lot of pain due to RA flare - sees PCP ([facility])  - BMP 03/02/22 reviewed and showed sodium 139, potassium 3.4, creatinine 0.82 & GFR >60  3: Obstructive sleep apnea- - does not wear CPAP  4: RA- - being followed by a rheumatologist Allena Katz) - had been in remission until her May hospitalization when she says that her methotrexate was stopped - RA has been flaring ever since and she doesn't see her rheumatologist until next month - encouraged her to to call his office and see if there's an earlier opening  5: PE- - saw cardiology ([facility]) 12/13/21 - saw hematology Cathie Hoops) 02/21/22 - wearing oxygen at 2L around the clock  - currently has home health nursing coming in once / week   Medication bottles reviewed.   Due to HF stability will not make a return appointment at this time. Advised patient to follow closely with cardiology and PCP but that she could call us back anytime for questions or to make another appointment. She was comfortable with this plan.

## 2022-03-13 ENCOUNTER — Encounter: Payer: Self-pay | Admitting: Family

## 2022-03-13 ENCOUNTER — Ambulatory Visit: Payer: Medicare Other | Attending: Family | Admitting: Family

## 2022-03-13 VITALS — BP 138/76 | HR 91 | Resp 18 | Ht 66.0 in

## 2022-03-13 DIAGNOSIS — I2510 Atherosclerotic heart disease of native coronary artery without angina pectoris: Secondary | ICD-10-CM | POA: Diagnosis not present

## 2022-03-13 DIAGNOSIS — I110 Hypertensive heart disease with heart failure: Secondary | ICD-10-CM | POA: Diagnosis not present

## 2022-03-13 DIAGNOSIS — M79643 Pain in unspecified hand: Secondary | ICD-10-CM | POA: Diagnosis not present

## 2022-03-13 DIAGNOSIS — I5022 Chronic systolic (congestive) heart failure: Secondary | ICD-10-CM | POA: Insufficient documentation

## 2022-03-13 DIAGNOSIS — K219 Gastro-esophageal reflux disease without esophagitis: Secondary | ICD-10-CM | POA: Diagnosis not present

## 2022-03-13 DIAGNOSIS — J45909 Unspecified asthma, uncomplicated: Secondary | ICD-10-CM | POA: Diagnosis not present

## 2022-03-13 DIAGNOSIS — G4733 Obstructive sleep apnea (adult) (pediatric): Secondary | ICD-10-CM | POA: Diagnosis not present

## 2022-03-13 DIAGNOSIS — I10 Essential (primary) hypertension: Secondary | ICD-10-CM | POA: Diagnosis not present

## 2022-03-13 DIAGNOSIS — I5020 Unspecified systolic (congestive) heart failure: Secondary | ICD-10-CM | POA: Diagnosis not present

## 2022-03-13 DIAGNOSIS — I2782 Chronic pulmonary embolism: Secondary | ICD-10-CM

## 2022-03-13 DIAGNOSIS — M069 Rheumatoid arthritis, unspecified: Secondary | ICD-10-CM | POA: Diagnosis not present

## 2022-03-13 NOTE — Patient Instructions (Addendum)
Continue weighing daily and call for an overnight weight gain of 3 pounds or more or a weekly weight gain of more than 5 pounds.   If you have voicemail, please make sure your mailbox is cleaned out so that we may leave a message and please make sure to listen to any voicemails.    If you receive a satisfaction survey regarding the [facility], please take the time to fill it out. This way we can continue to provide excellent care and make any changes that need to be made.    Call us in the future if you need us for anything or to make another appointment   

## 2022-03-20 ENCOUNTER — Other Ambulatory Visit: Payer: Self-pay

## 2022-03-20 ENCOUNTER — Emergency Department: Payer: Medicare Other

## 2022-03-20 ENCOUNTER — Emergency Department
Admission: EM | Admit: 2022-03-20 | Discharge: 2022-03-20 | Disposition: A | Discharge status: Home / Self Care | Payer: Medicare Other | Attending: Emergency Medicine | Admitting: Emergency Medicine

## 2022-03-20 DIAGNOSIS — M79604 Pain in right leg: Secondary | ICD-10-CM | POA: Diagnosis present

## 2022-03-20 DIAGNOSIS — I2510 Atherosclerotic heart disease of native coronary artery without angina pectoris: Secondary | ICD-10-CM | POA: Insufficient documentation

## 2022-03-20 DIAGNOSIS — Z7901 Long term (current) use of anticoagulants: Secondary | ICD-10-CM | POA: Diagnosis not present

## 2022-03-20 DIAGNOSIS — J449 Chronic obstructive pulmonary disease, unspecified: Secondary | ICD-10-CM | POA: Insufficient documentation

## 2022-03-20 DIAGNOSIS — J45909 Unspecified asthma, uncomplicated: Secondary | ICD-10-CM | POA: Insufficient documentation

## 2022-03-20 DIAGNOSIS — M7121 Synovial cyst of popliteal space [Baker], right knee: Secondary | ICD-10-CM

## 2022-03-20 DIAGNOSIS — I509 Heart failure, unspecified: Secondary | ICD-10-CM | POA: Insufficient documentation

## 2022-03-20 LAB — BASIC METABOLIC PANEL
Anion gap: 6 (ref 5–15)
BUN: 20 mg/dL (ref 8–23)
CO2: 26 mmol/L (ref 22–32)
Calcium: 8.6 mg/dL — ABNORMAL LOW (ref 8.9–10.3)
Chloride: 109 mmol/L (ref 98–111)
Creatinine, Ser: 0.77 mg/dL (ref 0.44–1.00)
GFR, Estimated: >60 mL/min (ref >60)
Glucose, Bld: 89 mg/dL (ref 70–99)
Potassium: 3.3 mmol/L — ABNORMAL LOW (ref 3.5–5.1)
Sodium: 141 mmol/L (ref 135–145)

## 2022-03-20 LAB — CBC
HCT: 34.8 % — ABNORMAL LOW (ref 36.0–46.0)
Hemoglobin: 10.7 g/dL — ABNORMAL LOW (ref 12.0–15.0)
MCH: 31.1 pg (ref 26.0–34.0)
MCHC: 30.7 g/dL (ref 30.0–36.0)
MCV: 101.2 fL — ABNORMAL HIGH (ref 80.0–100.0)
Platelets: 335 10*3/uL (ref 150–400)
RBC: 3.44 MIL/uL — ABNORMAL LOW (ref 3.87–5.11)
RDW: 16 % — ABNORMAL HIGH (ref 11.5–15.5)
WBC: 7.1 10*3/uL (ref 4.0–10.5)
nRBC: 0 % (ref 0.0–0.2)

## 2022-03-20 LAB — HEPATIC FUNCTION PANEL
ALT: 11 U/L (ref 0–44)
AST: 17 U/L (ref 15–41)
Albumin: 2.8 g/dL — ABNORMAL LOW (ref 3.5–5.0)
Alkaline Phosphatase: 63 U/L (ref 38–126)
Bilirubin, Direct: 0.1 mg/dL (ref 0.0–0.2)
Indirect Bilirubin: 0.7 mg/dL (ref 0.3–0.9)
Total Bilirubin: 0.8 mg/dL (ref 0.3–1.2)
Total Protein: 7.4 g/dL (ref 6.5–8.1)

## 2022-03-20 LAB — PROTIME-INR
INR: 1.5 — ABNORMAL HIGH (ref 0.8–1.2)
Prothrombin Time: 18.2 seconds — ABNORMAL HIGH (ref 11.4–15.2)

## 2022-03-20 LAB — BRAIN NATRIURETIC PEPTIDE: B Natriuretic Peptide: 792.5 pg/mL — ABNORMAL HIGH (ref 0.0–100.0)

## 2022-03-20 MED ORDER — POTASSIUM CHLORIDE CRYS ER 20 MEQ PO TBCR
40.0000 meq | EXTENDED_RELEASE_TABLET | Freq: Once | ORAL | Status: AC
  Administered 2022-03-20: 40 meq via ORAL
  Filled 2022-03-20: qty 2

## 2022-03-20 MED ORDER — OXYCODONE HCL 5 MG PO TABS: 2.5000 mg | ORAL_TABLET | Freq: Three times a day (TID) | ORAL | 0 refills | Status: AC | PRN

## 2022-03-20 NOTE — ED Provider Notes (Signed)
[facility] Provider Note    Event Date/Time   First MD Initiated Contact with Patient 03/20/22 1715     (approximate)   History   Leg Pain (And swelling )   HPI  Shermeka Rutt is a 76 y.o. female  recent PE on eliquis dnies any missed doses, CHF, COPD/asthma, CAD, rheumatoid arthritis with unilateral R leg pain and swelling.  The pain is on the calf.  The pain is worse with movement and palpation. She wears 2L at home since having the PE.  No chest pain, or SOB. No coughing. She is on lasix for CHF as well.  Patient reports that she noticed this yesterday.      Physical Exam   Triage Vital Signs: ED Triage Vitals  Enc Vitals Group     BP 03/20/22 1349 128/80     Pulse Rate 03/20/22 1349 84     Resp 03/20/22 1349 16     Temp 03/20/22 1349 97.7 F (36.5 C)     Temp Source 03/20/22 1349 Oral     SpO2 03/20/22 1349 97 %     Weight 03/20/22 1353 165 lb (74.8 kg)     Height 03/20/22 1353 5\' 6"  (1.676 m)     Head Circumference --      Peak Flow --      Pain Score 03/20/22 1353 8     Pain Loc --      Pain Edu? --      Excl. in GC? --     Most recent vital signs: Vitals:   03/20/22 1349  BP: 128/80  Pulse: 84  Resp: 16  Temp: 97.7 F (36.5 C)  SpO2: 97%     General: Awake, no distress.  CV:  Good peripheral perfusion.  Resp:  Normal effort.  Abd:  No distention.  Other:  Patient has swelling and pain and behind the right knee.  She is got good distal pulse.  She got full range of motion of the leg.  She able to lift the leg up and bend the leg.  She is able to flex and extend her ankle.  She is able to ambulate to the bathroom.  There is no warmth or redness noted   ED Results / Procedures / Treatments   Labs (all labs ordered are listed, but only abnormal results are displayed) Labs Reviewed  CBC - Abnormal; Notable for the following components:      Result Value   RBC 3.44 (*)    Hemoglobin 10.7 (*)    HCT 34.8 (*)     MCV 101.2 (*)    RDW 16.0 (*)    All other components within normal limits  BASIC METABOLIC PANEL - Abnormal; Notable for the following components:   Potassium 3.3 (*)    Calcium 8.6 (*)    All other components within normal limits  PROTIME-INR - Abnormal; Notable for the following components:   Prothrombin Time 18.2 (*)    INR 1.5 (*)    All other components within normal limits  BRAIN NATRIURETIC PEPTIDE - Abnormal; Notable for the following components:   B Natriuretic Peptide 792.5 (*)    All other components within normal limits  HEPATIC FUNCTION PANEL - Abnormal; Notable for the following components:   Albumin 2.8 (*)    All other components within normal limits     EKG  My interpretation of EKG:  Normal sinus rate of 88, no st elevation, no twi,  normal intervals.   RADIOLOGY I have reviewed the xray personally and interpreted and some cardiomegaly  IMPRESSION: Cardiomegaly. Atherosclerosis. Suspicion of early congestive heart failure.  PROCEDURES:  Critical Care performed: No  Procedures   MEDICATIONS ORDERED IN ED: Medications  potassium chloride SA (KLOR-CON M) CR tablet 40 mEq (has no administration in time range)     IMPRESSION / MDM / ASSESSMENT AND PLAN / ED COURSE  I reviewed the triage vital signs and the nursing notes.   Patient's presentation is most consistent with acute presentation with potential threat to life or bodily function.   Differential includes CHF, DVT, Baker's cyst, abscess.  Good distal pulse.  No evidence of infection  LFTs reassuring.  CBC hemoglobin is around baseline.  White count is normal.  BMP shows slightly low potassium we will give some oral repletion.  Patient has a large probable complex Baker cyst  On examination does not feel warm or erythematous to suggest abscess.  No evidence of septic knee.  Discussed with patient and her hemoglobin is around baseline do not feel like it is a large hematoma.  Discussed with  her that this is a Baker's cyst.  Will provide compression and give orthopedic follow-up.  Patient unable to take ibuprofen and pain not better with Tylenol.  She is already on steroids.  We discussed benefits and risk of low-dose oxycodone which she would like to proceed with and understands the risk for falls associated with it but she states that she needs something to have at home or when the pain is worse.  She understands to return to the ER she develops any fevers, redness or the swelling is worsening otherwise she can follow-up with orthopedic surgery    FINAL CLINICAL IMPRESSION(S) / ED DIAGNOSES   Final diagnoses:  Baker cyst, right     Rx / DC Orders   ED Discharge Orders          Ordered    oxyCODONE (ROXICODONE) 5 MG immediate release tablet  Every 8 hours PRN        03/20/22 1849             Note:  This document was prepared using Dragon voice recognition software and may include unintentional dictation errors.   Concha Se, MD 03/20/22 1850

## 2022-03-20 NOTE — ED Triage Notes (Signed)
Pt to ED via [facility] from home. Pt reports right lower leg pain that started this morning. Pt with hx PE and recently placed on blood thinner. Pt localizes pain to posterior calf. Pt wears 2L [place] chronically. Pt denies recent travel or injury

## 2022-03-20 NOTE — ED Provider Triage Note (Signed)
Emergency Medicine Provider Triage Evaluation Note  Tina Fields , a 76 y.o. female  was evaluated in triage.  Pt complains of swelling to the right lower extremity.  Patient was recently hospitalized for a PE and currently taking Eliquis.  She noticed this morning that her right lower extremity was swollen.  She denies any injury, traveling, smoking.  Hospitalized 01/28/2022  Review of Systems  Positive: Right lower leg edema, second digit discoloration Negative: Negative for skin wounds.  Physical Exam  BP 128/80 (BP Location: Left Arm)   Pulse 84   Temp 97.7 F (36.5 C) (Oral)   Resp 16   Ht 5\' 6"  (1.676 m)   Wt 74.8 kg   SpO2 97%   BMI 26.63 kg/m  Gen:   Awake, no distress   Resp:  Normal effort  MSK:   Mild tenderness right lower extremity without erythema or warmth present.  Skin is intact.  Second digit in comparison with remaining digits is discolored with no skin trauma noticed.  DP present    Medical Decision Making  Medically screening exam initiated at 1:57 PM.  Appropriate orders placed.  Tina Fields was informed that the remainder of the evaluation will be completed by another provider, this initial triage assessment does not replace that evaluation, and the importance of remaining in the ED until their evaluation is complete.     Alyssa Grove, PA-C 03/20/22 1401

## 2022-03-20 NOTE — Discharge Instructions (Addendum)
You have a probable Baker's cyst noted on ultrasound-continue to wear a compression bandage.  Take Tylenol 1 g every 8 hours.  We discussed the oxycodone and the risk for falls and confusion I would recommend starting at a very low dose at 2.5 or half a pill and to start off using at nighttime to make sure it does not make you feel dizzy or weak and to use as sparingly as possible.  Please call orthopedics to make a follow-up appointment  Return for fevers, worsening swelling or any other concerns  Take oxycodone as prescribed. Do not drink alcohol, drive or participate in any other potentially dangerous activities while taking this medication as it may make you sleepy. Do not take this medication with any other sedating medications, either prescription or over-the-counter. If you were prescribed Percocet or Vicodin, do not take these with acetaminophen (Tylenol) as it is already contained within these medications.  This medication is an opiate (or narcotic) pain medication and can be habit forming. Use it as little as possible to achieve adequate pain control. Do not use or use it with extreme caution if you have a history of opiate abuse or dependence. If you are on a pain contract with your primary care doctor or a pain specialist, be sure to let them know you were prescribed this medication today from the Emergency Department. This medication is intended for your use only - do not give any to anyone else and keep it in a secure place where nobody else, especially children, have access to it.     IMPRESSION: No evidence of right lower extremity DVT.   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

## 2022-04-24 ENCOUNTER — Inpatient Hospital Stay: Payer: Medicare Other | Attending: Oncology

## 2022-04-24 DIAGNOSIS — I2609 Other pulmonary embolism with acute cor pulmonale: Secondary | ICD-10-CM | POA: Diagnosis present

## 2022-04-24 DIAGNOSIS — Z7901 Long term (current) use of anticoagulants: Secondary | ICD-10-CM | POA: Insufficient documentation

## 2022-04-24 LAB — CBC WITH DIFFERENTIAL/PLATELET
Abs Immature Granulocytes: 0.01 10*3/uL (ref 0.00–0.07)
Basophils Absolute: 0 10*3/uL (ref 0.0–0.1)
Basophils Relative: 0 %
Eosinophils Absolute: 0 10*3/uL (ref 0.0–0.5)
Eosinophils Relative: 0 %
HCT: 39 % (ref 36.0–46.0)
Hemoglobin: 12.4 g/dL (ref 12.0–15.0)
Immature Granulocytes: 0 %
Lymphocytes Relative: 34 %
Lymphs Abs: 1.5 10*3/uL (ref 0.7–4.0)
MCH: 32.9 pg (ref 26.0–34.0)
MCHC: 31.8 g/dL (ref 30.0–36.0)
MCV: 103.4 fL — ABNORMAL HIGH (ref 80.0–100.0)
Monocytes Absolute: 0.4 10*3/uL (ref 0.1–1.0)
Monocytes Relative: 9 %
Neutro Abs: 2.5 10*3/uL (ref 1.7–7.7)
Neutrophils Relative %: 57 %
Platelets: 232 10*3/uL (ref 150–400)
RBC: 3.77 MIL/uL — ABNORMAL LOW (ref 3.87–5.11)
RDW: 15.6 % — ABNORMAL HIGH (ref 11.5–15.5)
WBC: 4.5 10*3/uL (ref 4.0–10.5)
nRBC: 0 % (ref 0.0–0.2)

## 2022-04-24 LAB — COMPREHENSIVE METABOLIC PANEL
ALT: 9 U/L (ref 0–44)
AST: 15 U/L (ref 15–41)
Albumin: 3 g/dL — ABNORMAL LOW (ref 3.5–5.0)
Alkaline Phosphatase: 73 U/L (ref 38–126)
Anion gap: 6 (ref 5–15)
BUN: 18 mg/dL (ref 8–23)
CO2: 26 mmol/L (ref 22–32)
Calcium: 8.6 mg/dL — ABNORMAL LOW (ref 8.9–10.3)
Chloride: 106 mmol/L (ref 98–111)
Creatinine, Ser: 0.75 mg/dL (ref 0.44–1.00)
GFR, Estimated: >60 mL/min (ref >60)
Glucose, Bld: 142 mg/dL — ABNORMAL HIGH (ref 70–99)
Potassium: 3.3 mmol/L — ABNORMAL LOW (ref 3.5–5.1)
Sodium: 138 mmol/L (ref 135–145)
Total Bilirubin: 0.7 mg/dL (ref 0.3–1.2)
Total Protein: 7.1 g/dL (ref 6.5–8.1)

## 2022-04-24 LAB — ANTITHROMBIN III: AntiThromb III Func: 95 % (ref 75–120)

## 2022-04-25 LAB — BETA-2-GLYCOPROTEIN I ABS, IGG/M/A
Beta-2 Glyco I IgG: <9 GPI IgG units (ref 0–20)
Beta-2-Glycoprotein I IgA: <9 GPI IgA units (ref 0–25)
Beta-2-Glycoprotein I IgM: <9 GPI IgM units (ref 0–32)

## 2022-04-27 LAB — ANTIPHOSPHOLIPID SYNDROME PROF
Anticardiolipin IgG: <9 GPL U/mL (ref 0–14)
Anticardiolipin IgM: <9 MPL U/mL (ref 0–12)
DRVVT: 62.1 s — ABNORMAL HIGH (ref 0.0–47.0)
PTT Lupus Anticoagulant: 38.8 s (ref 0.0–43.5)

## 2022-04-27 LAB — DRVVT MIX: dRVVT Mix: 50.5 s — ABNORMAL HIGH (ref 0.0–40.4)

## 2022-04-27 LAB — PROTEIN S, TOTAL AND FREE
Protein S Ag, Free: 92 % (ref 61–136)
Protein S Ag, Total: 100 % (ref 60–150)

## 2022-04-27 LAB — DRVVT CONFIRM: dRVVT Confirm: 0.9 ratio (ref 0.8–1.2)

## 2022-04-27 LAB — PROTEIN C ACTIVITY: Protein C Activity: 101 % (ref 73–180)

## 2022-05-01 LAB — PROTHROMBIN GENE MUTATION

## 2022-05-09 ENCOUNTER — Inpatient Hospital Stay: Payer: Medicare Other | Attending: Oncology | Admitting: Oncology

## 2022-05-09 ENCOUNTER — Encounter: Payer: Self-pay | Admitting: Oncology

## 2022-05-09 VITALS — BP 138/73 | HR 92 | Temp 97.8°F | Resp 19 | Wt 171.0 lb

## 2022-05-09 DIAGNOSIS — I2699 Other pulmonary embolism without acute cor pulmonale: Secondary | ICD-10-CM | POA: Insufficient documentation

## 2022-05-09 DIAGNOSIS — J449 Chronic obstructive pulmonary disease, unspecified: Secondary | ICD-10-CM | POA: Diagnosis not present

## 2022-05-09 DIAGNOSIS — Z86711 Personal history of pulmonary embolism: Secondary | ICD-10-CM

## 2022-05-09 DIAGNOSIS — Z7901 Long term (current) use of anticoagulants: Secondary | ICD-10-CM | POA: Diagnosis not present

## 2022-05-09 DIAGNOSIS — Z801 Family history of malignant neoplasm of trachea, bronchus and lung: Secondary | ICD-10-CM | POA: Diagnosis not present

## 2022-05-09 LAB — FACTOR 5 LEIDEN

## 2022-05-09 MED ORDER — APIXABAN 5 MG PO TABS: 5.0000 mg | ORAL_TABLET | Freq: Two times a day (BID) | ORAL | 0 refills | Status: DC

## 2022-05-09 NOTE — Progress Notes (Signed)
Patient here for oncology follow-up appointment,  concerns of nose bleeds

## 2022-05-09 NOTE — Progress Notes (Signed)
[facility] CONSULT NOTE  REFERRING PROVIDER: Oswaldo Conroy, MD   Patient Care Team: Oswaldo Conroy, MD as PCP - General (Family Medicine)  ASSESSMENT & PLAN:  History of pulmonary embolism Unprovoked pulmonary embolism continue Eliquis 5mg  BID for total of 6 months- End of Nov 2023 Decrease to Eliquis 2.5mg  BID after that hypercoagulable work up negative.  Recommend age appropriate cancer screening.    Orders Placed This Encounter  Procedures   CBC with Differential/Platelet    Standing Status:   Future    Standing Expiration Date:   05/10/2023   Comprehensive metabolic panel    Standing Status:   Future    Standing Expiration Date:   05/09/2023     All questions were answered. The patient knows to call the clinic with any problems, questions or concerns. No barriers to learning was detected.  Rickard Patience, MD 05/09/2022   CHIEF COMPLAINTS/PURPOSE OF CONSULTATION:  Pulmonary Embolism  HISTORY OF PRESENTING ILLNESS:  Tina Fields 76 y.o. female is referred to establish care for evaluation of pulmonary embolism.   01/28/22 - 02/01/22 patient presented for evaluation of SOB. She was found to be hypoxic.  Work up included CTa which was positive for acute pulmonary embolism,  CT angio chest 01/28/22: Positive for acute PE with CT evidence of right heart strain (RV/LV ratio: 1.06) consistent with submassive PE. Extensive bilateral ground glass opacities with underlying changes of COPD. In the presence of interval mediastinal and bilateral hilar adenopathy, this is most likely infectious in origin with associated reactive adenopathy. Cardiomegaly. Previously demonstrated liver and bilateral renal cysts. Hiatal hernia. Patient denies any triggering immobilization factors. She was started on heparin gtt and transitioned to Elqiuis at discharge.  Venous Doppler did not show DVT in lower extremities.  She was also treated with community pneumonia.  Family  history of lung cancer - paternal aunt.  Appetite is fair. She is accompanied by cousin.    INTERVAL HISTORY Tina Fields is a 76 y.o. female who has above history reviewed by me today presents for follow up visit for management of history of pulmonary embolism  She feels well today. SOB has improved a lot and she has found her oxygen level remains stable while off oxygen.  No new complaint.    MEDICAL HISTORY:  Past Medical History:  Diagnosis Date   Aortic atherosclerosis (HCC)    Asthma    CHF (congestive heart failure) (HCC)    Coronary artery disease    Dyspnea    Edema of both legs    GERD (gastroesophageal reflux disease)    HFrEF (heart failure with reduced ejection fraction) (HCC)    History of 2019 novel coronavirus disease (COVID-19) 10/28/2020   Hypertension    Myocardial infarction (HCC)    OSA (obstructive sleep apnea)    noncompliant with nocturnal PAP therapy   RA (rheumatoid arthritis) (HCC)     SURGICAL HISTORY: Past Surgical History:  Procedure Laterality Date   BREAST SURGERY     CARDIAC CATHETERIZATION Left 06/21/2005   Moderate ostial LCx and LAD disease; LVEF 45%; Location: [facility]; Surgeon: Adrian Blackwater, MD   CARDIAC CATHETERIZATION Left 05/20/2008   No occlusive CAD; LVEF 65%; Location: [facility]; Surgeon: Rudean Hitt, MD   COLONOSCOPY     ESOPHAGOGASTRODUODENOSCOPY     TUBAL LIGATION      SOCIAL HISTORY: Social History   Socioeconomic History   Marital status: Widowed    Spouse name: Not on file  Number of children: Not on file   Years of education: Not on file   Highest education level: Not on file  Occupational History   Not on file  Tobacco Use   Smoking status: Former    Types: Cigarettes    Quit date: 01/09/1995    Years since quitting: 27.3   Smokeless tobacco: Never  Vaping Use   Vaping Use: Never used  Substance and Sexual Activity   Alcohol use: No   Drug use: No   Sexual activity: Yes    Birth  control/protection: None  Other Topics Concern   Not on file  Social History Narrative   Adult son   Social Determinants of Health   Financial Resource Strain: Unknown (08/21/2017)   Overall Financial Resource Strain (CARDIA)    Difficulty of Paying Living Expenses: Patient refused  Food Insecurity: Unknown (08/21/2017)   Hunger Vital Sign    Worried About Running Out of Food in the Last Year: Patient refused    Ran Out of Food in the Last Year: Patient refused  Transportation Needs: Unknown (08/21/2017)   PRAPARE - Transportation    Lack of Transportation (Medical): Patient refused    Lack of Transportation (Non-Medical): Patient refused  Physical Activity: Unknown (08/21/2017)   Exercise Vital Sign    Days of Exercise per Week: Patient refused    Minutes of Exercise per Session: Patient refused  Stress: Unknown (08/21/2017)   [employer] of Occupational Health - Occupational Stress Questionnaire    Feeling of Stress : Patient refused  Social Connections: Unknown (08/21/2017)   Social Connection and Isolation Panel [NHANES]    Frequency of Communication with Friends and Family: Patient refused    Frequency of Social Gatherings with Friends and Family: Patient refused    Attends Religious Services: Patient refused    Active Member of Clubs or Organizations: Patient refused    Attends Banker Meetings: Patient refused    Marital Status: Patient refused  Intimate Partner Violence: Unknown (08/21/2017)   Humiliation, Afraid, Rape, and Kick questionnaire    Fear of Current or Ex-Partner: Patient refused    Emotionally Abused: Patient refused    Physically Abused: Patient refused    Sexually Abused: Patient refused    FAMILY HISTORY: Family History  Problem Relation Age of Onset   CAD Mother    Diabetes Mother    Hypertension Mother    CAD Father    Diabetes Father    Hypertension Father    Stroke Father     ALLERGIES:  is allergic to ace  inhibitors, hydrochlorothiazide, latex, and azithromycin.  MEDICATIONS:  Current Outpatient Medications  Medication Sig Dispense Refill   acetaminophen (TYLENOL) 650 MG CR tablet Take 1,300 mg by mouth as needed for pain.     albuterol (VENTOLIN HFA) 108 (90 Base) MCG/ACT inhaler Inhale 1-2 puffs into the lungs every 6 (six) hours as needed for wheezing or shortness of breath.     atorvastatin (LIPITOR) 40 MG tablet Take 1 tablet (40 mg total) by mouth every morning. 90 tablet 0   [employer] VITAMIN B12 1000 MCG TBCR Take 1,000 mcg by mouth daily.     ergocalciferol (VITAMIN D2) 1.25 MG (50000 UT) capsule Take 50,000 Units by mouth once a week. Every monday     fluticasone (FLONASE) 50 MCG/ACT nasal spray Place 2 sprays into the nose daily as needed for allergies.     folic acid (FOLVITE) 1 MG tablet Take 1 mg by mouth daily.  furosemide (LASIX) 20 MG tablet Take 1 tablet (20 mg total) by mouth daily. 90 tablet 0   isosorbide mononitrate (IMDUR) 30 MG 24 hr tablet Take 30 mg by mouth daily.     MIRALAX 17 GM/SCOOP powder Take 17 g by mouth daily as needed for moderate constipation. Dissolve 17 gm in 8 ounces of water and drink     montelukast (SINGULAIR) 10 MG tablet Take 10 mg by mouth at bedtime.     albuterol (PROVENTIL) (2.5 MG/3ML) 0.083% nebulizer solution Take 2.5 mg by nebulization every 6 (six) hours as needed for wheezing or shortness of breath. (Patient not taking: Reported on 03/13/2022)     apixaban (ELIQUIS) 5 MG TABS tablet Take 1 tablet (5 mg total) by mouth 2 (two) times daily. 180 tablet 0   HUMIRA PEN 40 MG/0.4ML PNKT Inject 40 mg into the skin every 14 (fourteen) days. (Patient not taking: Reported on 03/13/2022)     No current facility-administered medications for this visit.    Review of Systems  Constitutional:  Positive for fatigue. Negative for appetite change, chills and fever.  HENT:   Negative for hearing loss and voice change.   Eyes:  Negative for eye problems.   Respiratory:  Negative for chest tightness, cough and shortness of breath.   Cardiovascular:  Negative for chest pain.  Gastrointestinal:  Negative for abdominal distention, abdominal pain and blood in stool.  Endocrine: Negative for hot flashes.  Genitourinary:  Negative for difficulty urinating and frequency.   Musculoskeletal:  Negative for arthralgias.  Skin:  Negative for itching and rash.  Neurological:  Negative for extremity weakness.  Hematological:  Negative for adenopathy.  Psychiatric/Behavioral:  Negative for confusion.      PHYSICAL EXAMINATION: Vitals:   05/09/22 1351  BP: 138/73  Pulse: 92  Resp: 19  Temp: 97.8 F (36.6 C)  SpO2: 100%   Filed Weights   05/09/22 1351  Weight: 171 lb (77.6 kg)    Physical Exam Constitutional:      General: She is not in acute distress.    Appearance: She is not diaphoretic.     Comments: Patient sits in the wheelchair.   HENT:     Head: Normocephalic and atraumatic.     Nose: Nose normal.     Mouth/Throat:     Pharynx: No oropharyngeal exudate.  Eyes:     General: No scleral icterus.    Pupils: Pupils are equal, round, and reactive to light.  Cardiovascular:     Rate and Rhythm: Normal rate and regular rhythm.     Heart sounds: No murmur heard. Pulmonary:     Effort: Pulmonary effort is normal. No respiratory distress.     Comments: She breathes comfortably on nasal cannula oxygen.  Abdominal:     Palpations: Abdomen is soft.  Musculoskeletal:        General: Normal range of motion.     Cervical back: Normal range of motion and neck supple.  Skin:    General: Skin is warm and dry.     Findings: No erythema.  Neurological:     Mental Status: She is alert and oriented to person, place, and time. Mental status is at baseline.     Cranial Nerves: No cranial nerve deficit.     Motor: No abnormal muscle tone.     Coordination: Coordination normal.  Psychiatric:        Mood and Affect: Mood and affect normal.       LABORATORY  DATA:  I have reviewed the data as listed Lab Results  Component Value Date   WBC 4.5 04/24/2022   HGB 12.4 04/24/2022   HCT 39.0 04/24/2022   MCV 103.4 (H) 04/24/2022   PLT 232 04/24/2022   Recent Labs    01/29/22 0236 01/29/22 0915 03/02/22 0818 03/20/22 1357 04/24/22 1257  NA 138   < > 139 141 138  K 2.7*  2.7*   < > 3.4* 3.3* 3.3*  CL 105   < > 107 109 106  CO2 24   < > GLUCOSE 123*   < > 118* 89 142*  BUN 14   < > CREATININE 0.90   < > 0.82 0.77 0.75  CALCIUM 7.4*   < > 9.0 8.6* 8.6*  GFRNONAA >60   < > >60 >60 >60  PROT 6.6  --   --  7.4 7.1  ALBUMIN 2.7*  --   --  2.8* 3.0*  AST 22  --   --  17 15  ALT 15  --   --  11 9  ALKPHOS 99  --   --  63 73  BILITOT 2.7*  --   --  0.8 0.7  BILIDIR  --   --   --  0.1  --   IBILI  --   --   --  0.7  --    < > = values in this interval not displayed.     RADIOGRAPHIC STUDIES: I have personally reviewed the radiological images as listed and agreed with the findings in the report. No results found.

## 2022-05-09 NOTE — Assessment & Plan Note (Addendum)
Unprovoked pulmonary embolism continue Eliquis 5mg  BID for total of 6 months- End of Nov 2023 Decrease to Eliquis 2.5mg  BID after that hypercoagulable work up negative.  Recommend age appropriate cancer screening.

## 2022-06-13 ENCOUNTER — Encounter: Payer: Self-pay | Admitting: Podiatry

## 2022-06-13 ENCOUNTER — Ambulatory Visit (INDEPENDENT_AMBULATORY_CARE_PROVIDER_SITE_OTHER): Payer: Medicare Other | Admitting: Podiatry

## 2022-06-13 DIAGNOSIS — G5732 Lesion of lateral popliteal nerve, left lower limb: Secondary | ICD-10-CM

## 2022-06-13 DIAGNOSIS — G5762 Lesion of plantar nerve, left lower limb: Secondary | ICD-10-CM

## 2022-06-13 DIAGNOSIS — M792 Neuralgia and neuritis, unspecified: Secondary | ICD-10-CM

## 2022-06-13 NOTE — Progress Notes (Signed)
Subjective:  Patient ID: Tina Fields, female    DOB: December 12, 1945,  MRN: [id number]  Chief Complaint  Patient presents with   Numbness    "My toes is numb." N - toes numb L - 2,3,4 digits left D - 7 months O - gradually getting worse C - numbness, feels like it's moving down into my foot A - none T - none    76 y.o. female presents with the above complaint.  Patient presents with numbness and tingling to the lateral 3 toes.  Patient states that it is gradually moving up if feels like it is moving down the foot.  Is been on for 2 to 3 months has progressive gotten worse worse with ambulation worse with pressure.  No aggravating factors no treatment options.  She wanted get it evaluated.   Review of Systems: Negative except as noted in the HPI. Denies N/V/F/Ch.  Past Medical History:  Diagnosis Date   Aortic atherosclerosis (HCC)    Asthma    CHF (congestive heart failure) (HCC)    Coronary artery disease    Dyspnea    Edema of both legs    GERD (gastroesophageal reflux disease)    HFrEF (heart failure with reduced ejection fraction) ([place])    History of 2019 novel coronavirus disease (COVID-19) 10/28/2020   Hypertension    Myocardial infarction (HCC)    OSA (obstructive sleep apnea)    noncompliant with nocturnal PAP therapy   RA (rheumatoid arthritis) ([place])     Current Outpatient Medications:    acetaminophen (TYLENOL) 650 MG CR tablet, Take 1,300 mg by mouth as needed for pain., Disp: , Rfl:    albuterol (PROVENTIL) (2.5 MG/3ML) 0.083% nebulizer solution, Take 2.5 mg by nebulization every 6 (six) hours as needed for wheezing or shortness of breath., Disp: , Rfl:    albuterol (VENTOLIN HFA) 108 (90 Base) MCG/ACT inhaler, Inhale 1-2 puffs into the lungs every 6 (six) hours as needed for wheezing or shortness of breath., Disp: , Rfl:    apixaban (ELIQUIS) 5 MG TABS tablet, Take 1 tablet (5 mg total) by mouth 2 (two) times daily., Disp: 180 tablet, Rfl: 0    atorvastatin (LIPITOR) 40 MG tablet, Take 1 tablet (40 mg total) by mouth every morning., Disp: 90 tablet, Rfl: 0   [employer] VITAMIN B12 1000 MCG TBCR, Take 1,000 mcg by mouth daily., Disp: , Rfl:    ergocalciferol (VITAMIN D2) 1.25 MG (50000 UT) capsule, Take 50,000 Units by mouth once a week. Every monday, Disp: , Rfl:    fluticasone (FLONASE) 50 MCG/ACT nasal spray, Place 2 sprays into the nose daily as needed for allergies., Disp: , Rfl:    folic acid (FOLVITE) 1 MG tablet, Take 1 mg by mouth daily., Disp: , Rfl:    furosemide (LASIX) 20 MG tablet, Take 1 tablet (20 mg total) by mouth daily., Disp: 90 tablet, Rfl: 0   isosorbide mononitrate (IMDUR) 30 MG 24 hr tablet, Take 30 mg by mouth daily., Disp: , Rfl:    MIRALAX 17 GM/SCOOP powder, Take 17 g by mouth daily as needed for moderate constipation. Dissolve 17 gm in 8 ounces of water and drink, Disp: , Rfl:    montelukast (SINGULAIR) 10 MG tablet, Take 10 mg by mouth at bedtime., Disp: , Rfl:    sacubitril-valsartan (ENTRESTO) 24-26 MG, Take 1 tablet by mouth 2 (two) times daily., Disp: , Rfl:    HUMIRA PEN 40 MG/0.4ML PNKT, Inject 40 mg into the skin every  14 (fourteen) days. (Patient not taking: Reported on 03/13/2022), Disp: , Rfl:   Social History   Tobacco Use  Smoking Status Former   Types: Cigarettes   Quit date: 01/09/1995   Years since quitting: 27.4  Smokeless Tobacco Never    Allergies  Allergen Reactions   Ace Inhibitors Cough    Other reaction(s): Cough    Hydrochlorothiazide Other (See Comments)    Cramps   Latex Hives   Azithromycin Rash   Objective:  There were no vitals filed for this visit. There is no height or weight on file to calculate BMI. Constitutional Well developed. Well nourished.  Vascular Dorsalis pedis pulses palpable bilaterally. Posterior tibial pulses palpable bilaterally. Capillary refill normal to all digits.  No cyanosis or clubbing noted. Pedal hair growth normal.  Neurologic Normal  speech. Oriented to person, place, and time. Positive Tinel's sign at superficial peroneal nerve.  Numbness tingling down to lateral 3 digits.  Dermatologic Nails well groomed and normal in appearance. No open wounds. No skin lesions.  Orthopedic: Manual muscle strength 4 out of 5 hammertoe contractures noted.   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 Neuritis    Plan:  Patient was evaluated and treated and all questions answered.  Left superficial peroneal neuropathy/compression -All questions and concerns were discussed with the patient in extensive detail.  Given the amount of numbness tingling that is present and gradually getting worse I believe she will benefit from nerve conduction study to assess the nerve signals to the foot.  Patient agrees to plan like to obtain the nerve conduction study nerve conduction study was ordered -She will also benefit from a nerve block to augment the diagnosis.  Superficial peroneal nerve more block was given.  I have asked her to monitor it over the next few hours to days to see if it helps with the improvement.  She states understanding will do so -After obtaining consent, and per orders of Dr. Nicholes Rough, injection of one-to-one mixture 1% lidocaine plain half percent Marcaine plain given by Candelaria Stagers. Patient instructed to remain in clinic for 20 minutes afterwards, and to report any adverse reaction to me immediately.    No follow-ups on file.   Left superficial peroneal compression syndrome injection nerve block given.  Nerve conduction study

## 2022-06-14 ENCOUNTER — Telehealth: Payer: Self-pay | Admitting: *Deleted

## 2022-06-14 NOTE — Telephone Encounter (Signed)
-----   Message from Tina Fields, DPM sent at 06/13/2022  2:32 PM EDT ----- Regarding: [facility] follow-up Hi Kyrin Gratz,   Can you send this patient over to [facility] neurology for nerve conduction study.  The order is placed

## 2022-06-15 ENCOUNTER — Encounter: Payer: Self-pay | Admitting: Neurology

## 2022-07-11 ENCOUNTER — Ambulatory Visit: Payer: Medicare Other | Admitting: Podiatry

## 2022-07-13 ENCOUNTER — Emergency Department: Payer: Medicare Other

## 2022-07-13 ENCOUNTER — Other Ambulatory Visit: Payer: Self-pay

## 2022-07-13 ENCOUNTER — Encounter: Payer: Self-pay | Admitting: Emergency Medicine

## 2022-07-13 ENCOUNTER — Emergency Department
Admission: EM | Admit: 2022-07-13 | Discharge: 2022-07-13 | Disposition: A | Discharge status: Home / Self Care | Payer: Medicare Other | Attending: Emergency Medicine | Admitting: Emergency Medicine

## 2022-07-13 DIAGNOSIS — R790 Abnormal level of blood mineral: Secondary | ICD-10-CM | POA: Insufficient documentation

## 2022-07-13 DIAGNOSIS — Z7901 Long term (current) use of anticoagulants: Secondary | ICD-10-CM | POA: Diagnosis not present

## 2022-07-13 DIAGNOSIS — Z79899 Other long term (current) drug therapy: Secondary | ICD-10-CM | POA: Insufficient documentation

## 2022-07-13 DIAGNOSIS — Z1152 Encounter for screening for COVID-19: Secondary | ICD-10-CM | POA: Diagnosis not present

## 2022-07-13 DIAGNOSIS — J45909 Unspecified asthma, uncomplicated: Secondary | ICD-10-CM | POA: Insufficient documentation

## 2022-07-13 DIAGNOSIS — Z8616 Personal history of COVID-19: Secondary | ICD-10-CM | POA: Diagnosis not present

## 2022-07-13 DIAGNOSIS — I110 Hypertensive heart disease with heart failure: Secondary | ICD-10-CM | POA: Diagnosis not present

## 2022-07-13 DIAGNOSIS — R0781 Pleurodynia: Secondary | ICD-10-CM | POA: Insufficient documentation

## 2022-07-13 DIAGNOSIS — Z7951 Long term (current) use of inhaled steroids: Secondary | ICD-10-CM | POA: Insufficient documentation

## 2022-07-13 DIAGNOSIS — I2510 Atherosclerotic heart disease of native coronary artery without angina pectoris: Secondary | ICD-10-CM | POA: Diagnosis not present

## 2022-07-13 DIAGNOSIS — I509 Heart failure, unspecified: Secondary | ICD-10-CM | POA: Diagnosis not present

## 2022-07-13 LAB — COMPREHENSIVE METABOLIC PANEL
ALT: 14 U/L (ref 0–44)
AST: 15 U/L (ref 15–41)
Albumin: 3.1 g/dL — ABNORMAL LOW (ref 3.5–5.0)
Alkaline Phosphatase: 79 U/L (ref 38–126)
Anion gap: 6 (ref 5–15)
BUN: 21 mg/dL (ref 8–23)
CO2: 27 mmol/L (ref 22–32)
Calcium: 8.6 mg/dL — ABNORMAL LOW (ref 8.9–10.3)
Chloride: 107 mmol/L (ref 98–111)
Creatinine, Ser: 0.94 mg/dL (ref 0.44–1.00)
GFR, Estimated: >60 mL/min (ref >60)
Glucose, Bld: 105 mg/dL — ABNORMAL HIGH (ref 70–99)
Potassium: 3.7 mmol/L (ref 3.5–5.1)
Sodium: 140 mmol/L (ref 135–145)
Total Bilirubin: 0.9 mg/dL (ref 0.3–1.2)
Total Protein: 7.2 g/dL (ref 6.5–8.1)

## 2022-07-13 LAB — CBC WITH DIFFERENTIAL/PLATELET
Abs Immature Granulocytes: 0.01 10*3/uL (ref 0.00–0.07)
Basophils Absolute: 0 10*3/uL (ref 0.0–0.1)
Basophils Relative: 0 %
Eosinophils Absolute: 0 10*3/uL (ref 0.0–0.5)
Eosinophils Relative: 0 %
HCT: 40 % (ref 36.0–46.0)
Hemoglobin: 12.8 g/dL (ref 12.0–15.0)
Immature Granulocytes: 0 %
Lymphocytes Relative: 26 %
Lymphs Abs: 1.3 10*3/uL (ref 0.7–4.0)
MCH: 32.5 pg (ref 26.0–34.0)
MCHC: 32 g/dL (ref 30.0–36.0)
MCV: 101.5 fL — ABNORMAL HIGH (ref 80.0–100.0)
Monocytes Absolute: 0.5 10*3/uL (ref 0.1–1.0)
Monocytes Relative: 11 %
Neutro Abs: 3 10*3/uL (ref 1.7–7.7)
Neutrophils Relative %: 63 %
Platelets: 205 10*3/uL (ref 150–400)
RBC: 3.94 MIL/uL (ref 3.87–5.11)
RDW: 13.9 % (ref 11.5–15.5)
WBC: 4.8 10*3/uL (ref 4.0–10.5)
nRBC: 0 % (ref 0.0–0.2)

## 2022-07-13 LAB — BRAIN NATRIURETIC PEPTIDE: B Natriuretic Peptide: 445.2 pg/mL — ABNORMAL HIGH (ref 0.0–100.0)

## 2022-07-13 LAB — RESP PANEL BY RT-PCR (FLU A&B, COVID) ARPGX2
Influenza A by PCR: NEGATIVE
Influenza B by PCR: NEGATIVE
SARS Coronavirus 2 by RT PCR: NEGATIVE

## 2022-07-13 LAB — TROPONIN I (HIGH SENSITIVITY): Troponin I (High Sensitivity): 10 ng/L (ref <18)

## 2022-07-13 LAB — LIPASE, BLOOD: Lipase: 40 U/L (ref 11–51)

## 2022-07-13 MED ORDER — IOHEXOL 350 MG/ML SOLN
75.0000 mL | Freq: Once | INTRAVENOUS | Status: AC | PRN
  Administered 2022-07-13: 75 mL via INTRAVENOUS

## 2022-07-13 NOTE — ED Triage Notes (Signed)
Pt to triage via w/c with no distress noted, brought in by EMS; pt last few days having prod cough clear sputum; st "it hurts to breathe"; st pain beneath rt breast with some intermittent SHOB

## 2022-07-13 NOTE — ED Provider Notes (Signed)
[facility] Provider Note    Event Date/Time   First MD Initiated Contact with Patient 07/13/22 [phone]     (approximate)   History   Shortness of Breath   HPI  Tina Fields is a 76 y.o. female with history of CHF, hypertension, pulmonary embolus on Eliquis who presents to the emergency department with right-sided lower chest pain present only with deep inspiration.  Denies feeling short of breath.  No fevers, cough.  No lower extremity swelling or pain.  No abdominal discomfort, vomiting or diarrhea.  States when she was diagnosed with a blood clot she did not have any symptoms.   History provided by patient.    Past Medical History:  Diagnosis Date   Aortic atherosclerosis (HCC)    Asthma    CHF (congestive heart failure) (HCC)    Coronary artery disease    Dyspnea    Edema of both legs    GERD (gastroesophageal reflux disease)    HFrEF (heart failure with reduced ejection fraction) (HCC)    History of 2019 novel coronavirus disease (COVID-19) 10/28/2020   Hypertension    Myocardial infarction (HCC)    OSA (obstructive sleep apnea)    noncompliant with nocturnal PAP therapy   RA (rheumatoid arthritis) (HCC)     Past Surgical History:  Procedure Laterality Date   BREAST SURGERY     CARDIAC CATHETERIZATION Left 06/21/2005   Moderate ostial LCx and LAD disease; LVEF 45%; Location: [facility]; Surgeon: Adrian Blackwater, MD   CARDIAC CATHETERIZATION Left 05/20/2008   No occlusive CAD; LVEF 65%; Location: [facility]; Surgeon: Rudean Hitt, MD   COLONOSCOPY     ESOPHAGOGASTRODUODENOSCOPY     TUBAL LIGATION      MEDICATIONS:  Prior to Admission medications   Medication Sig Start Date End Date Taking? Authorizing Provider  acetaminophen (TYLENOL) 650 MG CR tablet Take 1,300 mg by mouth as needed for pain.    [provider]  albuterol (PROVENTIL) (2.5 MG/3ML) 0.083% nebulizer solution Take 2.5 mg by nebulization every 6 (six) hours as  needed for wheezing or shortness of breath.    [provider]  albuterol (VENTOLIN HFA) 108 (90 Base) MCG/ACT inhaler Inhale 1-2 puffs into the lungs every 6 (six) hours as needed for wheezing or shortness of breath. 11/06/20   [provider]  apixaban (ELIQUIS) 5 MG TABS tablet Take 1 tablet (5 mg total) by mouth 2 (two) times daily. 05/09/22   Rickard Patience, MD  atorvastatin (LIPITOR) 40 MG tablet Take 1 tablet (40 mg total) by mouth every morning. 12/05/21   Darlin Drop, DO  [facility] VITAMIN B12 1000 MCG TBCR Take 1,000 mcg by mouth daily. 12/25/21   [provider]  ergocalciferol (VITAMIN D2) 1.25 MG (50000 UT) capsule Take 50,000 Units by mouth once a week. Every monday    [provider]  fluticasone (FLONASE) 50 MCG/ACT nasal spray Place 2 sprays into the nose daily as needed for allergies. 12/03/18   [provider]  folic acid (FOLVITE) 1 MG tablet Take 1 mg by mouth daily. 09/19/21   [provider]  furosemide (LASIX) 20 MG tablet Take 1 tablet (20 mg total) by mouth daily. 12/05/21   Hall, Carole N, DO  HUMIRA PEN 40 MG/0.4ML PNKT Inject 40 mg into the skin every 14 (fourteen) days. Patient not taking: Reported on 03/13/2022 01/16/22   [provider]  isosorbide mononitrate (IMDUR) 30 MG 24 hr tablet Take 30 mg by mouth  daily.    [provider]  [facility] 17 GM/SCOOP powder Take 17 g by mouth daily as needed for moderate constipation. Dissolve 17 gm in 8 ounces of water and drink 01/11/21   [provider]  montelukast (SINGULAIR) 10 MG tablet Take 10 mg by mouth at bedtime.    [provider]  sacubitril-valsartan (ENTRESTO) 24-26 MG Take 1 tablet by mouth 2 (two) times daily.    [provider]    Physical Exam   Triage Vital Signs: ED Triage Vitals  Enc Vitals Group     BP 07/13/22 0333 (!) 142/76     Pulse Rate 07/13/22 0333 80     Resp 07/13/22 0333 18     Temp 07/13/22 0333 98 F (36.7 C)      Temp Source 07/13/22 0333 Oral     SpO2 07/13/22 0333 100 %     Weight 07/13/22 0329 166 lb (75.3 kg)     Height 07/13/22 0329 5\' 6"  (1.676 m)     Head Circumference --      Peak Flow --      Pain Score 07/13/22 0329 10     Pain Loc --      Pain Edu? --      Excl. in GC? --     Most recent vital signs: Vitals:   07/13/22 0333 07/13/22 0510  BP: (!) 142/76   Pulse: 80   Resp: 18   Temp: 98 F (36.7 C) 97.7 F (36.5 C)  SpO2: 100%     CONSTITUTIONAL: Alert and oriented and responds appropriately to questions. Well-appearing; well-nourished HEAD: Normocephalic, atraumatic EYES: Conjunctivae clear, pupils appear equal, sclera nonicteric ENT: normal nose; moist mucous membranes NECK: Supple, normal ROM CARD: RRR; S1 and S2 appreciated; no murmurs, no clicks, no rubs, no gallops CHEST:  Chest wall is nontender to palpation.  No crepitus, ecchymosis, erythema, warmth, rash or other lesions present.   RESP: Normal chest excursion without splinting or tachypnea; breath sounds clear and equal bilaterally; no wheezes, no rhonchi, no rales, no hypoxia or respiratory distress, speaking full sentences ABD/GI: Normal bowel sounds; non-distended; soft, non-tender, no rebound, no guarding, no peritoneal signs, no tenderness in the right upper quadrant or epigastric region BACK: The back appears normal EXT: Normal ROM in all joints; no deformity noted, no edema; no cyanosis, no calf tenderness or calf swelling SKIN: Normal color for age and race; warm; no rash on exposed skin NEURO: Moves all extremities equally, normal speech PSYCH: The patient's mood and manner are appropriate.   ED Results / Procedures / Treatments   LABS: (all labs ordered are listed, but only abnormal results are displayed) Labs Reviewed  CBC WITH DIFFERENTIAL/PLATELET - Abnormal; Notable for the following components:      Result Value   MCV 101.5 (*)    All other components within normal limits  COMPREHENSIVE  METABOLIC PANEL - Abnormal; Notable for the following components:   Glucose, Bld 105 (*)    Calcium 8.6 (*)    Albumin 3.1 (*)    All other components within normal limits  BRAIN NATRIURETIC PEPTIDE - Abnormal; Notable for the following components:   B Natriuretic Peptide 445.2 (*)    All other components within normal limits  RESP PANEL BY RT-PCR (FLU A&B, COVID) ARPGX2  LIPASE, BLOOD  TROPONIN I (HIGH SENSITIVITY)  TROPONIN I (HIGH SENSITIVITY)     EKG:  EKG Interpretation  Date/Time:  Thursday July 13 2022 03:30:23 EST Ventricular  Rate:  84 PR Interval:  142 QRS Duration: 88 QT Interval:  398 QTC Calculation: 470 R Axis:   8 Text Interpretation: Normal sinus rhythm Nonspecific T wave abnormality Abnormal ECG When compared with ECG of 20-Mar-2022 14:05, No significant change was found Confirmed by Rochele Raring [phone]) on 07/13/2022 4:48:59 AM         RADIOLOGY: My personal review and interpretation of imaging: Chest x-ray clear.  CTA of the chest shows chronic right lower lobe pulmonary embolus that was present in 01-13-23.  No acute abnormality.  I have personally reviewed all radiology reports.   CT Angio Chest PE W and/or Wo Contrast  Result Date: 07/13/2022 CLINICAL DATA:  76 year old female with new productive cough and chest pain beneath the left breast with shortness of breath. History of acute PE in 01/13/23. EXAM: CT ANGIOGRAPHY CHEST WITH CONTRAST TECHNIQUE: Multidetector CT imaging of the chest was performed using the standard protocol during bolus administration of intravenous contrast. Multiplanar CT image reconstructions and MIPs were obtained to evaluate the vascular anatomy. RADIATION DOSE REDUCTION: This exam was performed according to the departmental dose-optimization program which includes automated exposure control, adjustment of the mA and/or kV according to patient size and/or use of iterative reconstruction technique. CONTRAST:  49mL OMNIPAQUE IOHEXOL 350  MG/ML SOLN COMPARISON:  CTA chest 01/28/2022. Portable chest x-ray this morning. FINDINGS: Cardiovascular: Excellent contrast bolus timing in the pulmonary arterial tree. Resolved right hilar thrombus seen on the 2023/01/13 CTA. No pulmonary artery filling defect today. Stable mild-to-moderate cardiomegaly. No pericardial effusion. Calcified aortic atherosclerosis. No aortic contrast. Calcified coronary artery atherosclerosis series 6, image 163. Mediastinum/Nodes: Maximal, reactive appearing mediastinal and hilar lymph nodes are stable since Jan 13, 2023. No discrete mediastinal mass. Lungs/Pleura: Similar lung volumes and atelectatic changes to the major airways which remain patent. Bilateral pulmonary mosaic attenuation appears unchanged since 01/13/23, and is pronounced bilaterally. No pleural effusion. No new or progressed lung opacity. Some underlying chronic subpleural lung scarring, with perhaps early honeycombing at both costophrenic angles. Subtle/early mosaic attenuation was present on a 2012 chest CT. Upper Abdomen: Mild contrast reflux from the right atrium into the hepatic IVC and hepatic veins. Otherwise stable visible liver. Negative visible spleen and bowel. Stable visible adrenal glands, kidneys, gallbladder. Musculoskeletal: Stable. No acute or suspicious osseous lesion identified. Review of the MIP images confirms the above findings. IMPRESSION: 1. Negative for acute pulmonary embolus. And resolved right lung thrombus seen in 01/13/23. 2. Unchanged appearance of pronounced bilateral pulmonary mosaic attenuation since Jan 13, 2023. Now favor chronic small vessel or chronic small airway disease, and there is some evidence of underlying chronic subpleural lung scarring - possible early honeycombing at the lung bases. No pleural effusion or new pulmonary abnormality. 3. Stable mild-to-moderate cardiomegaly, reactive appearing mediastinal lymph nodes. Electronically Signed   By: Odessa Fleming M.D.   On: 07/13/2022 05:44   DG Chest Portable  1 View  Result Date: 07/13/2022 CLINICAL DATA:  Cough EXAM: PORTABLE CHEST 1 VIEW COMPARISON:  Chest radiograph dated 03/20/2022. CT chest dated 01/28/2022. FINDINGS: Increased interstitial markings, left upper lobe predominant, compatible with interstitial lung disease when correlating with prior CT. No pleural effusion or pneumothorax. The heart is normal in size.  Thoracic aortic atherosclerosis. IMPRESSION: Stable chronic interstitial lung disease. No evidence of acute cardiopulmonary disease. Electronically Signed   By: Charline Bills M.D.   On: 07/13/2022 03:45     PROCEDURES:  Critical Care performed: No     .1-3 Lead EKG Interpretation  Performed by:  Purcell Jungbluth, Layla Maw, DO Authorized by: Coraima Tibbs, Layla Maw, DO     Interpretation: normal     ECG rate:  80   ECG rate assessment: normal     Rhythm: sinus rhythm     Ectopy: none     Conduction: normal       IMPRESSION / MDM / ASSESSMENT AND PLAN / ED COURSE  I reviewed the triage vital signs and the nursing notes.    Patient here with right-sided pleuritic chest pain.  The patient is on the cardiac monitor to evaluate for evidence of arrhythmia and/or significant heart rate changes.   DIFFERENTIAL DIAGNOSIS (includes but not limited to):   PE, pleurisy, pneumonia, CHF, doubt ACS   Patient's presentation is most consistent with acute presentation with potential threat to life or bodily function.   PLAN: Patient's work-up initiated from triage.  No leukocytosis, normal hemoglobin, normal electrolytes and renal function.  LFTs normal.  We will add on lipase.  First troponin negative.  COVID and flu negative.  BNP minimally elevated but this actually appears much better than it has in the past few months and she does not look volume overloaded today.  Chest x-ray reviewed and interpreted by myself and the radiologist and shows no acute abnormality.  EKG nonischemic.  She declines anything for pain.  Will obtain CTA of her chest  given history of previous pulmonary embolus.   MEDICATIONS GIVEN IN ED: Medications  iohexol (OMNIPAQUE) 350 MG/ML injection 75 mL (75 mLs Intravenous Contrast Given 07/13/22 0515)     ED COURSE: Patient CT scan reviewed and interpreted by myself and the radiologist.  CT shows no acute PE and right lung thrombus seen previously has resolved.  She has chronic changes in the lungs but no new acute abnormality.  She has stable cardiomegaly and no edema.  No hypoxia, respiratory distress, splinting.  I feel she is safe to be discharged home and take Tylenol as needed for her pleuritic pain.  I do not think this is ACS and I do not feel she needs serial troponins today.  Will discharge home.   At this time, I do not feel there is any life-threatening condition present. I reviewed all nursing notes, vitals, pertinent previous records.  All lab and urine results, EKGs, imaging ordered have been independently reviewed and interpreted by myself.  I reviewed all available radiology reports from any imaging ordered this visit.  Based on my assessment, I feel the patient is safe to be discharged home without further emergent workup and can continue workup as an outpatient as needed. Discussed all findings, treatment plan as well as usual and customary return precautions.  They verbalize understanding and are comfortable with this plan.  Outpatient follow-up has been provided as needed.  All questions have been answered.    CONSULTS:  none   OUTSIDE RECORDS REVIEWED: Reviewed patient's previous hematology/oncology, cardiology and rheumatology notes in 2023 as well as her last admission for PE in May 2023.       FINAL CLINICAL IMPRESSION(S) / ED DIAGNOSES   Final diagnoses:  Pleuritic chest pain     Rx / DC Orders   ED Discharge Orders     None        Note:  This document was prepared using Dragon voice recognition software and may include unintentional dictation errors.   Praneel Haisley, Layla Maw, DO 07/13/22 [phone]

## 2022-07-13 NOTE — Discharge Instructions (Addendum)
CT scan showed no acute abnormality and your previous pulmonary embolus has resolved but please continue your blood thinner as prescribed.  You do not have any sign of pneumonia or heart failure exacerbation today.  Your COVID test was negative.  You may take Tylenol 1000 mg every 6 hours as needed for pain.

## 2022-07-13 NOTE — ED Notes (Signed)
Patient transported to CT 

## 2022-07-18 ENCOUNTER — Encounter: Payer: Medicare Other | Admitting: Neurology

## 2022-07-28 ENCOUNTER — Emergency Department: Payer: Medicare Other

## 2022-07-28 ENCOUNTER — Emergency Department
Admission: EM | Admit: 2022-07-28 | Discharge: 2022-07-29 | Disposition: A | Discharge status: Home / Self Care | Payer: Medicare Other | Attending: Emergency Medicine | Admitting: Emergency Medicine

## 2022-07-28 ENCOUNTER — Other Ambulatory Visit: Payer: Self-pay

## 2022-07-28 DIAGNOSIS — U071 COVID-19: Secondary | ICD-10-CM | POA: Insufficient documentation

## 2022-07-28 DIAGNOSIS — J45909 Unspecified asthma, uncomplicated: Secondary | ICD-10-CM | POA: Diagnosis not present

## 2022-07-28 DIAGNOSIS — I509 Heart failure, unspecified: Secondary | ICD-10-CM | POA: Insufficient documentation

## 2022-07-28 DIAGNOSIS — N3000 Acute cystitis without hematuria: Secondary | ICD-10-CM | POA: Diagnosis not present

## 2022-07-28 DIAGNOSIS — R509 Fever, unspecified: Secondary | ICD-10-CM | POA: Diagnosis present

## 2022-07-28 DIAGNOSIS — I2510 Atherosclerotic heart disease of native coronary artery without angina pectoris: Secondary | ICD-10-CM | POA: Diagnosis not present

## 2022-07-28 DIAGNOSIS — I110 Hypertensive heart disease with heart failure: Secondary | ICD-10-CM | POA: Diagnosis not present

## 2022-07-28 LAB — CBC WITH DIFFERENTIAL/PLATELET
Abs Immature Granulocytes: 0.03 10*3/uL (ref 0.00–0.07)
Basophils Absolute: 0 10*3/uL (ref 0.0–0.1)
Basophils Relative: 0 %
Eosinophils Absolute: 0.1 10*3/uL (ref 0.0–0.5)
Eosinophils Relative: 3 %
HCT: 38.2 % (ref 36.0–46.0)
Hemoglobin: 12.3 g/dL (ref 12.0–15.0)
Immature Granulocytes: 1 %
Lymphocytes Relative: 13 %
Lymphs Abs: 0.7 10*3/uL (ref 0.7–4.0)
MCH: 32.3 pg (ref 26.0–34.0)
MCHC: 32.2 g/dL (ref 30.0–36.0)
MCV: 100.3 fL — ABNORMAL HIGH (ref 80.0–100.0)
Monocytes Absolute: 0.9 10*3/uL (ref 0.1–1.0)
Monocytes Relative: 16 %
Neutro Abs: 3.9 10*3/uL (ref 1.7–7.7)
Neutrophils Relative %: 67 %
Platelets: 199 10*3/uL (ref 150–400)
RBC: 3.81 MIL/uL — ABNORMAL LOW (ref 3.87–5.11)
RDW: 14.3 % (ref 11.5–15.5)
WBC: 5.7 10*3/uL (ref 4.0–10.5)
nRBC: 0 % (ref 0.0–0.2)

## 2022-07-28 LAB — URINALYSIS, ROUTINE W REFLEX MICROSCOPIC
Bilirubin Urine: NEGATIVE
Glucose, UA: NEGATIVE mg/dL
Ketones, ur: 5 mg/dL — AB
Nitrite: NEGATIVE
Protein, ur: 30 mg/dL — AB
RBC / HPF: >50 RBC/hpf — ABNORMAL HIGH (ref 0–5)
Specific Gravity, Urine: 1.019 (ref 1.005–1.030)
WBC, UA: >50 WBC/hpf — ABNORMAL HIGH (ref 0–5)
pH: 6 (ref 5.0–8.0)

## 2022-07-28 LAB — RESP PANEL BY RT-PCR (FLU A&B, COVID) ARPGX2
Influenza A by PCR: NEGATIVE
Influenza B by PCR: NEGATIVE
SARS Coronavirus 2 by RT PCR: POSITIVE — AB

## 2022-07-28 LAB — BASIC METABOLIC PANEL
Anion gap: 7 (ref 5–15)
BUN: 15 mg/dL (ref 8–23)
CO2: 24 mmol/L (ref 22–32)
Calcium: 8.4 mg/dL — ABNORMAL LOW (ref 8.9–10.3)
Chloride: 106 mmol/L (ref 98–111)
Creatinine, Ser: 0.83 mg/dL (ref 0.44–1.00)
GFR, Estimated: >60 mL/min (ref >60)
Glucose, Bld: 87 mg/dL (ref 70–99)
Potassium: 3.3 mmol/L — ABNORMAL LOW (ref 3.5–5.1)
Sodium: 137 mmol/L (ref 135–145)

## 2022-07-28 LAB — PROCALCITONIN: Procalcitonin: <0.1 ng/mL

## 2022-07-28 MED ORDER — SODIUM CHLORIDE 0.9 % IV SOLN
1.0000 g | Freq: Once | INTRAVENOUS | Status: AC
  Administered 2022-07-28: 1 g via INTRAVENOUS
  Filled 2022-07-28: qty 10

## 2022-07-28 MED ORDER — MOLNUPIRAVIR 200 MG PO CAPS: 4.0000 | ORAL_CAPSULE | Freq: Two times a day (BID) | ORAL | 0 refills | Status: AC

## 2022-07-28 MED ORDER — CEFPODOXIME PROXETIL 200 MG PO TABS: 200.0000 mg | ORAL_TABLET | Freq: Two times a day (BID) | ORAL | 0 refills | Status: AC

## 2022-07-28 MED ORDER — POTASSIUM CHLORIDE CRYS ER 20 MEQ PO TBCR
20.0000 meq | EXTENDED_RELEASE_TABLET | Freq: Once | ORAL | Status: AC
  Administered 2022-07-28: 20 meq via ORAL
  Filled 2022-07-28: qty 1

## 2022-07-28 NOTE — ED Triage Notes (Signed)
Pt advised she was brought in via EMS. Her home healthcare nurse called EMS bc they were concerned about her low grade fever and possible UTI with dark urine. Pt also has a cough. Pt is CAOx4 and in no acute distress. Pt is on chronic O2 at 2 liters.

## 2022-07-28 NOTE — ED Notes (Signed)
Ambulated patient-patient's oxygen saturation stayed above 96% on 2L nasal cannula, which is her baseline.

## 2022-07-28 NOTE — ED Provider Notes (Signed)
[facility] Provider Note    Event Date/Time   First MD Initiated Contact with Patient 07/28/22 2004     (approximate)   History   Chief Complaint Fever   HPI  Tina Fields is a 76 y.o. female with past medical history of hypertension, CAD, CHF, asthma, and rheumatoid arthritis who presents to the ED complaining of fever.  Patient reports that she has been feeling ill for the past 24 hours and was found to have a temperature of 102.0 at home earlier today.  She has had a cough productive of whitish sputum and reports feeling congested, but has not had any chest pain or shortness of breath.  She denies any nausea, vomiting, diarrhea, dysuria, hematuria, or flank pain.  She is not aware of any sick contacts.  Her home health nurse did report concerns that her urine had a strong odor.  She wears 2 L nasal cannula chronically of supplemental oxygen, additionally takes methotrexate for her rheumatoid arthritis.  She has not taken any Tylenol or ibuprofen since her fever earlier today.     Physical Exam   Triage Vital Signs: ED Triage Vitals  Enc Vitals Group     BP 07/28/22 1728 (!) 144/77     Pulse Rate 07/28/22 1728 100     Resp 07/28/22 1728 (!) 24     Temp 07/28/22 1730 99.5 F (37.5 C)     Temp Source 07/28/22 1730 Oral     SpO2 07/28/22 1728 97 %     Weight 07/28/22 1729 165 lb 12.6 oz (75.2 kg)     Height 07/28/22 1729 5\' 6"  (1.676 m)     Head Circumference --      Peak Flow --      Pain Score 07/28/22 1729 5     Pain Loc --      Pain Edu? --      Excl. in GC? --     Most recent vital signs: Vitals:   07/28/22 2030 07/28/22 2230  BP: 132/78 (!) 145/75  Pulse: 86 82  Resp: 18 18  Temp: 98.9 F (37.2 C)   SpO2: 99% 99%    Constitutional: Alert and oriented. Eyes: Conjunctivae are normal. Head: Atraumatic. Nose: No congestion/rhinnorhea. Mouth/Throat: Mucous membranes are moist.  Cardiovascular: Normal rate, regular rhythm.  Grossly normal heart sounds.  2+ radial pulses bilaterally. Respiratory: Normal respiratory effort.  No retractions. Lungs CTAB. Gastrointestinal: Soft and nontender. No distention. Musculoskeletal: No lower extremity tenderness nor edema.  Neurologic:  Normal speech and language. No gross focal neurologic deficits are appreciated.    ED Results / Procedures / Treatments   Labs (all labs ordered are listed, but only abnormal results are displayed) Labs Reviewed  RESP PANEL BY RT-PCR (FLU A&B, COVID) ARPGX2 - Abnormal; Notable for the following components:      Result Value   SARS Coronavirus 2 by RT PCR POSITIVE (*)    All other components within normal limits  BASIC METABOLIC PANEL - Abnormal; Notable for the following components:   Potassium 3.3 (*)    Calcium 8.4 (*)    All other components within normal limits  CBC WITH DIFFERENTIAL/PLATELET - Abnormal; Notable for the following components:   RBC 3.81 (*)    MCV 100.3 (*)    All other components within normal limits  URINALYSIS, ROUTINE W REFLEX MICROSCOPIC - Abnormal; Notable for the following components:   Color, Urine YELLOW (*)    APPearance HAZY (*)  Hgb urine dipstick MODERATE (*)    Ketones, ur 5 (*)    Protein, ur 30 (*)    Leukocytes,Ua LARGE (*)    RBC / HPF >50 (*)    WBC, UA >50 (*)    Bacteria, UA RARE (*)    All other components within normal limits  URINE CULTURE  PROCALCITONIN   RADIOLOGY Chest x-ray reviewed and interpreted by me with interstitial opacity noted, no focal infiltrate or effusion noted.  PROCEDURES:  Critical Care performed: No  Procedures   MEDICATIONS ORDERED IN ED: Medications  cefTRIAXone (ROCEPHIN) 1 g in sodium chloride 0.9 % 100 mL IVPB (has no administration in time range)  potassium chloride SA (KLOR-CON M) CR tablet 20 mEq (20 mEq Oral Given 07/28/22 2048)     IMPRESSION / MDM / ASSESSMENT AND PLAN / ED COURSE  I reviewed the triage vital signs and the nursing  notes.                              76 y.o. female with past medical history of hypertension, CAD, CHF, asthma, rheumatoid arthritis who presents to the ED complaining of 24 hours of cough and congestion associated with fever earlier today.  Patient's presentation is most consistent with acute presentation with potential threat to life or bodily function.  Differential diagnosis includes, but is not limited to, sepsis, pneumonia, UTI, bronchitis, COVID-19, influenza, electrolyte abnormality, AKI, other viral syndrome.  Patient nontoxic-appearing and in no acute distress, vital signs are reassuring as she is afebrile here in the ED, remarkable only for mild tachypnea.  She is not in any respiratory distress on my assessment, currently maintaining oxygen saturations on her usual supplemental 2 L nasal cannula.  Chest x-ray shows interstitial infiltrates versus edema, no signs of fluid overload on exam and x-ray raises suspicion for viral illness such as COVID-19 or influenza.  Viral testing is pending at this time, will add on procalcitonin to further assess for occult pneumonia.  Labs are reassuring with no significant anemia or leukocytosis, mild hypokalemia noted with no significant AKI.  Urinalysis is also pending however patient denies any symptoms of UTI.  Patient tested positive for COVID-19, which explains her respiratory symptoms.  Low suspicion for occult pneumonia as procalcitonin is negative.  Patient does appear to have a UTI and we will send urine for culture, treat with IV dose of Rocephin here in the ED.  Patient was able to ambulate on a pulse ox with minimal respiratory difficulty, maintained oxygen saturations while doing so.  She is appropriate for outpatient management, was counseled to closely follow-up with her PCP and to return to the ED for new or worsening symptoms.  Unfortunately, Paxlovid would interact with her anticoagulation and we will prescribe her molnupiravir.  Patient  agrees with plan.      FINAL CLINICAL IMPRESSION(S) / ED DIAGNOSES   Final diagnoses:  COVID-19  Acute cystitis without hematuria     Rx / DC Orders   ED Discharge Orders          Ordered    cefpodoxime (VANTIN) 200 MG tablet  2 times daily        07/28/22 2327    molnupiravir EUA (LAGEVRIO) 200 MG CAPS capsule  2 times daily        07/28/22 2327             Note:  This document was prepared using Dragon  voice recognition software and may include unintentional dictation errors.   Blake Divine, MD 07/28/22 2329

## 2022-07-28 NOTE — ED Provider Triage Note (Signed)
Emergency Medicine Provider Triage Evaluation Note  Tina Fields , a 76 y.o. female  was evaluated in triage.  Pt complains of headache and fever. Per EMS, home health nurse noticed her urine has a strong odor as well.  Physical Exam  There were no vitals taken for this visit. Gen:   Awake, no distress   Resp:  Normal effort  MSK:   Moves extremities without difficulty  Other:    Medical Decision Making  Medically screening exam initiated at 5:26 PM.  Appropriate orders placed.  Tina Fields was informed that the remainder of the evaluation will be completed by another provider, this initial triage assessment does not replace that evaluation, and the importance of remaining in the ED until their evaluation is complete.    Chinita Pester, FNP 07/28/22 1728

## 2022-07-28 NOTE — ED Triage Notes (Signed)
First Nurse: Pt here via [facility] from home with weakness. Pt's home health nurse was concerned about pt's fever. Pt states that her urine has had a strong odor.  34-RR 97% on 2L-PRN at home 111 99.9 141/75 20G R hand-500cc of NS given by ems

## 2022-07-30 LAB — URINE CULTURE: Culture: 40000 — AB

## 2022-08-08 ENCOUNTER — Inpatient Hospital Stay (HOSPITAL_BASED_OUTPATIENT_CLINIC_OR_DEPARTMENT_OTHER): Payer: Medicare Other | Admitting: Oncology

## 2022-08-08 ENCOUNTER — Encounter: Payer: Self-pay | Admitting: Oncology

## 2022-08-08 ENCOUNTER — Inpatient Hospital Stay: Payer: Medicare Other | Attending: Oncology

## 2022-08-08 VITALS — BP 130/74 | HR 77 | Temp 96.5°F | Wt 174.4 lb

## 2022-08-08 DIAGNOSIS — Z86711 Personal history of pulmonary embolism: Secondary | ICD-10-CM | POA: Insufficient documentation

## 2022-08-08 DIAGNOSIS — D649 Anemia, unspecified: Secondary | ICD-10-CM | POA: Diagnosis not present

## 2022-08-08 DIAGNOSIS — Z87891 Personal history of nicotine dependence: Secondary | ICD-10-CM | POA: Diagnosis not present

## 2022-08-08 DIAGNOSIS — Z7901 Long term (current) use of anticoagulants: Secondary | ICD-10-CM | POA: Diagnosis not present

## 2022-08-08 LAB — COMPREHENSIVE METABOLIC PANEL
ALT: 9 U/L (ref 0–44)
AST: 16 U/L (ref 15–41)
Albumin: 3 g/dL — ABNORMAL LOW (ref 3.5–5.0)
Alkaline Phosphatase: 65 U/L (ref 38–126)
Anion gap: 10 (ref 5–15)
BUN: 18 mg/dL (ref 8–23)
CO2: 25 mmol/L (ref 22–32)
Calcium: 8.6 mg/dL — ABNORMAL LOW (ref 8.9–10.3)
Chloride: 104 mmol/L (ref 98–111)
Creatinine, Ser: 0.79 mg/dL (ref 0.44–1.00)
GFR, Estimated: >60 mL/min (ref >60)
Glucose, Bld: 124 mg/dL — ABNORMAL HIGH (ref 70–99)
Potassium: 3.5 mmol/L (ref 3.5–5.1)
Sodium: 139 mmol/L (ref 135–145)
Total Bilirubin: 0.6 mg/dL (ref 0.3–1.2)
Total Protein: 7 g/dL (ref 6.5–8.1)

## 2022-08-08 LAB — CBC WITH DIFFERENTIAL/PLATELET
Abs Immature Granulocytes: 0.01 10*3/uL (ref 0.00–0.07)
Basophils Absolute: 0 10*3/uL (ref 0.0–0.1)
Basophils Relative: 0 %
Eosinophils Absolute: 0 10*3/uL (ref 0.0–0.5)
Eosinophils Relative: 0 %
HCT: 38.3 % (ref 36.0–46.0)
Hemoglobin: 12.4 g/dL (ref 12.0–15.0)
Immature Granulocytes: 0 %
Lymphocytes Relative: 21 %
Lymphs Abs: 0.9 10*3/uL (ref 0.7–4.0)
MCH: 32.6 pg (ref 26.0–34.0)
MCHC: 32.4 g/dL (ref 30.0–36.0)
MCV: 100.8 fL — ABNORMAL HIGH (ref 80.0–100.0)
Monocytes Absolute: 0.4 10*3/uL (ref 0.1–1.0)
Monocytes Relative: 9 %
Neutro Abs: 2.9 10*3/uL (ref 1.7–7.7)
Neutrophils Relative %: 70 %
Platelets: 253 10*3/uL (ref 150–400)
RBC: 3.8 MIL/uL — ABNORMAL LOW (ref 3.87–5.11)
RDW: 13.4 % (ref 11.5–15.5)
WBC: 4.1 10*3/uL (ref 4.0–10.5)
nRBC: 0 % (ref 0.0–0.2)

## 2022-08-08 MED ORDER — APIXABAN 2.5 MG PO TABS: 2.5000 mg | ORAL_TABLET | Freq: Two times a day (BID) | ORAL | 5 refills | Status: DC

## 2022-08-09 NOTE — Assessment & Plan Note (Addendum)
Hemoglobin is stable, close to baseline.  Slight macrocytosis, persistent. Recommend patient to start empiric vitamin B12 supplementation.  Will check B12 and folate at the next visit.

## 2022-08-09 NOTE — Assessment & Plan Note (Addendum)
Unprovoked pulmonary embolism She has finished 6 months of therapeutic anticoagulation. hypercoagulable work up negative.  Decrease to Eliquis 2.5mg  BID.  Prescription was sent to pharmacy. Recommend age appropriate cancer screening.

## 2022-08-09 NOTE — Progress Notes (Signed)
Hematology/Oncology Progress note [phone] [phone]      Patient Care Team: Letta Median, MD as PCP - General (Family Medicine)  ASSESSMENT & PLAN:   History of pulmonary embolism Unprovoked pulmonary embolism She has finished 6 months of therapeutic anticoagulation. hypercoagulable work up negative.  Decrease to Eliquis 2.5mg  BID.  Prescription was sent to pharmacy. Recommend age appropriate cancer screening.    Normocytic anemia Hemoglobin is stable, close to baseline.  Slight macrocytosis, persistent. Recommend patient to start empiric vitamin B12 supplementation.  Will check B12 and folate at the next visit.   Orders Placed This Encounter  Procedures   CBC with Differential/Platelet    Standing Status:   Future    Standing Expiration Date:   08/08/2023   Comprehensive metabolic panel    Standing Status:   Future    Standing Expiration Date:   08/08/2023   Vitamin B12    Standing Status:   Future    Standing Expiration Date:   08/09/2023     All questions were answered. The patient knows to call the clinic with any problems, questions or concerns. No barriers to learning was detected.  Earlie Server, MD 08/08/2022   CHIEF COMPLAINTS/PURPOSE OF CONSULTATION:  Pulmonary Embolism  HISTORY OF PRESENTING ILLNESS:  Tina Fields 76 y.o. female is referred to establish care for evaluation of pulmonary embolism.   01/28/22 - 02/01/22 patient presented for evaluation of SOB. She was found to be hypoxic.  Work up included CTa which was positive for acute pulmonary embolism,  CT angio chest 01/28/22: Positive for acute PE with CT evidence of right heart strain (RV/LV ratio: 1.06) consistent with submassive PE. Extensive bilateral ground glass opacities with underlying changes of COPD. In the presence of interval mediastinal and bilateral hilar adenopathy, this is most likely infectious in origin with associated reactive adenopathy. Cardiomegaly.  Previously demonstrated liver and bilateral renal cysts. Hiatal hernia. Patient denies any triggering immobilization factors. She was started on heparin gtt and transitioned to Elqiuis at discharge.  Venous Doppler did not show DVT in lower extremities.  She was also treated with community pneumonia.  Family history of lung cancer - paternal aunt.  Appetite is fair. She is accompanied by cousin.    INTERVAL HISTORY Tina Fields is a 76 y.o. female who has above history reviewed by me today presents for follow up visit for management of history of pulmonary embolism  She feels well today. SOB has improved a lot and she has found her oxygen level remains stable while off oxygen.  No new complaint.    MEDICAL HISTORY:  Past Medical History:  Diagnosis Date   Aortic atherosclerosis (HCC)    Asthma    CHF (congestive heart failure) (HCC)    Coronary artery disease    Dyspnea    Edema of both legs    GERD (gastroesophageal reflux disease)    HFrEF (heart failure with reduced ejection fraction) ([place])    History of 2019 novel coronavirus disease (COVID-19) 10/28/2020   Hypertension    Myocardial infarction (HCC)    OSA (obstructive sleep apnea)    noncompliant with nocturnal PAP therapy   RA (rheumatoid arthritis) ([place])     SURGICAL HISTORY: Past Surgical History:  Procedure Laterality Date   BREAST SURGERY     CARDIAC CATHETERIZATION Left 06/21/2005   Moderate ostial LCx and LAD disease; LVEF 45%; Location: [facility]; Surgeon: Neoma Laming, MD   CARDIAC CATHETERIZATION Left 05/20/2008   No occlusive CAD;  LVEF 65%; Location: [facility]; Surgeon: Katrine Coho, MD   COLONOSCOPY     ESOPHAGOGASTRODUODENOSCOPY     TUBAL LIGATION      SOCIAL HISTORY: Social History   Socioeconomic History   Marital status: Widowed    Spouse name: Not on file   Number of children: Not on file   Years of education: Not on file   Highest education level: Not on file  Occupational History    Not on file  Tobacco Use   Smoking status: Former    Types: Cigarettes    Quit date: 01/09/1995    Years since quitting: 27.6   Smokeless tobacco: Never  Vaping Use   Vaping Use: Never used  Substance and Sexual Activity   Alcohol use: No   Drug use: No   Sexual activity: Yes    Birth control/protection: None  Other Topics Concern   Not on file  Social History Narrative   Adult son   Social Determinants of Health   Financial Resource Strain: Unknown (08/21/2017)   Overall Financial Resource Strain (CARDIA)    Difficulty of Paying Living Expenses: Patient refused  Food Insecurity: Unknown (08/21/2017)   Hunger Vital Sign    Worried About [place] in the Last Year: Patient refused    [place] in the Last Year: Patient refused  Transportation Needs: Unknown (08/21/2017)   PRAPARE - Transportation    Lack of Transportation (Medical): Patient refused    Lack of Transportation (Non-Medical): Patient refused  Physical Activity: Unknown (08/21/2017)   Exercise Vital Sign    Days of Exercise per Week: Patient refused    Minutes of Exercise per Session: Patient refused  Stress: Unknown (08/21/2017)   [place]    Feeling of Stress : Patient refused  Social Connections: Unknown (08/21/2017)   Social Connection and Isolation Panel [NHANES]    Frequency of Communication with Friends and Family: Patient refused    Frequency of Social Gatherings with Friends and Family: Patient refused    Attends Religious Services: Patient refused    Active Member of Clubs or Organizations: Patient refused    Attends Archivist Meetings: Patient refused    Marital Status: Patient refused  Intimate Partner Violence: Unknown (08/21/2017)   Humiliation, Afraid, Rape, and Kick questionnaire    Fear of Current or Ex-Partner: Patient refused    Emotionally Abused: Patient refused    Physically Abused: Patient  refused    Sexually Abused: Patient refused    FAMILY HISTORY: Family History  Problem Relation Age of Onset   CAD Mother    Diabetes Mother    Hypertension Mother    CAD Father    Diabetes Father    Hypertension Father    Stroke Father     ALLERGIES:  is allergic to ace inhibitors, hydrochlorothiazide, latex, and azithromycin.  MEDICATIONS:  Current Outpatient Medications  Medication Sig Dispense Refill   acetaminophen (TYLENOL) 650 MG CR tablet Take 1,300 mg by mouth as needed for pain.     albuterol (PROVENTIL) (2.5 MG/3ML) 0.083% nebulizer solution Take 2.5 mg by nebulization every 6 (six) hours as needed for wheezing or shortness of breath.     albuterol (VENTOLIN HFA) 108 (90 Base) MCG/ACT inhaler Inhale 1-2 puffs into the lungs every 6 (six) hours as needed for wheezing or shortness of breath.     apixaban (ELIQUIS) 2.5 MG TABS tablet Take 1 tablet (2.5 mg total) by mouth  2 (two) times daily. 60 tablet 5   atorvastatin (LIPITOR) 40 MG tablet Take 1 tablet (40 mg total) by mouth every morning. 90 tablet 0   [employer] VITAMIN B12 1000 MCG TBCR Take 1,000 mcg by mouth daily.     ergocalciferol (VITAMIN D2) 1.25 MG (50000 UT) capsule Take 50,000 Units by mouth once a week. Every monday     fluticasone (FLONASE) 50 MCG/ACT nasal spray Place 2 sprays into the nose daily as needed for allergies.     folic acid (FOLVITE) 1 MG tablet Take 1 mg by mouth daily.     furosemide (LASIX) 20 MG tablet Take 1 tablet (20 mg total) by mouth daily. 90 tablet 0   isosorbide mononitrate (IMDUR) 30 MG 24 hr tablet Take 30 mg by mouth daily.     MIRALAX 17 GM/SCOOP powder Take 17 g by mouth daily as needed for moderate constipation. Dissolve 17 gm in 8 ounces of water and drink     montelukast (SINGULAIR) 10 MG tablet Take 10 mg by mouth at bedtime.     sacubitril-valsartan (ENTRESTO) 24-26 MG Take 1 tablet by mouth 2 (two) times daily.     HUMIRA PEN 40 MG/0.4ML PNKT Inject 40 mg into the skin every 14  (fourteen) days. (Patient not taking: Reported on 03/13/2022)     No current facility-administered medications for this visit.    Review of Systems  Constitutional:  Positive for fatigue. Negative for appetite change, chills and fever.  HENT:   Negative for hearing loss and voice change.   Eyes:  Negative for eye problems.  Respiratory:  Negative for chest tightness, cough and shortness of breath.   Cardiovascular:  Negative for chest pain.  Gastrointestinal:  Negative for abdominal distention, abdominal pain and blood in stool.  Endocrine: Negative for hot flashes.  Genitourinary:  Negative for difficulty urinating and frequency.   Musculoskeletal:  Negative for arthralgias.  Skin:  Negative for itching and rash.  Neurological:  Negative for extremity weakness.  Hematological:  Negative for adenopathy.  Psychiatric/Behavioral:  Negative for confusion.      PHYSICAL EXAMINATION: Vitals:   08/08/22 1423  BP: 130/74  Pulse: 77  Temp: (!) 96.5 F (35.8 C)  SpO2: 97%   Filed Weights   08/08/22 1423  Weight: 174 lb 6.4 oz (79.1 kg)    Physical Exam Constitutional:      General: She is not in acute distress.    Appearance: She is not diaphoretic.     Comments: Patient sits in the wheelchair.   HENT:     Head: Normocephalic and atraumatic.     Nose: Nose normal.     Mouth/Throat:     Pharynx: No oropharyngeal exudate.  Eyes:     General: No scleral icterus.    Pupils: Pupils are equal, round, and reactive to light.  Cardiovascular:     Rate and Rhythm: Normal rate and regular rhythm.     Heart sounds: No murmur heard. Pulmonary:     Effort: Pulmonary effort is normal. No respiratory distress.     Comments: She breathes comfortably on nasal cannula oxygen.  Abdominal:     Palpations: Abdomen is soft.  Musculoskeletal:        General: Normal range of motion.     Cervical back: Normal range of motion and neck supple.  Skin:    General: Skin is warm and dry.      Findings: No erythema.  Neurological:     Mental  Status: She is alert and oriented to person, place, and time. Mental status is at baseline.     Cranial Nerves: No cranial nerve deficit.     Motor: No abnormal muscle tone.     Coordination: Coordination normal.  Psychiatric:        Mood and Affect: Mood and affect normal.      LABORATORY DATA:  I have reviewed the data as listed Lab Results  Component Value Date   WBC 4.1 08/08/2022   HGB 12.4 08/08/2022   HCT 38.3 08/08/2022   MCV 100.8 (H) 08/08/2022   PLT 253 08/08/2022   Recent Labs    03/20/22 1357 04/24/22 1257 07/13/22 0336 07/28/22 1733 08/08/22 1410  NA 141 138 140 137 139  K 3.3* 3.3* 3.7 3.3* 3.5  CL 109 106 107 106 104  CO2 26 26 27 24 25   GLUCOSE 89 142* 105* 87 124*  BUN 20 18 21 15 18   CREATININE 0.77 0.75 0.94 0.83 0.79  CALCIUM 8.6* 8.6* 8.6* 8.4* 8.6*  GFRNONAA >60 >60 >60 >60 >60  PROT 7.4 7.1 7.2  --  7.0  ALBUMIN 2.8* 3.0* 3.1*  --  3.0*  AST 17 15 15   --  16  ALT 11 9 14   --  9  ALKPHOS 63 73 79  --  65  BILITOT 0.8 0.7 0.9  --  0.6  BILIDIR 0.1  --   --   --   --   IBILI 0.7  --   --   --   --      RADIOGRAPHIC STUDIES: I have personally reviewed the radiological images as listed and agreed with the findings in the report. DG Chest 2 View  Result Date: 07/28/2022 CLINICAL DATA:  Fever EXAM: CHEST - 2 VIEW COMPARISON:  07/13/2022 FINDINGS: The heart size and mediastinal contours are within normal limits. Diffuse bilateral interstitial opacity. The visualized skeletal structures are unremarkable. IMPRESSION: Diffuse bilateral interstitial opacity, consistent with edema or atypical/viral infection. No focal airspace opacity. Electronically Signed   By: M.D.   On: 07/28/2022 17:50   CT Angio Chest PE W and/or Wo Contrast  Result Date: 07/13/2022 CLINICAL DATA:  76 year old female with new productive cough and chest pain beneath the left breast with shortness of breath. History  of acute PE in 01-29-2023. EXAM: CT ANGIOGRAPHY CHEST WITH CONTRAST TECHNIQUE: Multidetector CT imaging of the chest was performed using the standard protocol during bolus administration of intravenous contrast. Multiplanar CT image reconstructions and MIPs were obtained to evaluate the vascular anatomy. RADIATION DOSE REDUCTION: This exam was performed according to the departmental dose-optimization program which includes automated exposure control, adjustment of the mA and/or kV according to patient size and/or use of iterative reconstruction technique. CONTRAST:  64mL OMNIPAQUE IOHEXOL 350 MG/ML SOLN COMPARISON:  CTA chest 01/28/2022. Portable chest x-ray this morning. FINDINGS: Cardiovascular: Excellent contrast bolus timing in the pulmonary arterial tree. Resolved right hilar thrombus seen on the 2023-01-29 CTA. No pulmonary artery filling defect today. Stable mild-to-moderate cardiomegaly. No pericardial effusion. Calcified aortic atherosclerosis. No aortic contrast. Calcified coronary artery atherosclerosis series 6, image 163. Mediastinum/Nodes: Maximal, reactive appearing mediastinal and hilar lymph nodes are stable since Jan 29, 2023. No discrete mediastinal mass. Lungs/Pleura: Similar lung volumes and atelectatic changes to the major airways which remain patent. Bilateral pulmonary mosaic attenuation appears unchanged since 01-29-2023, and is pronounced bilaterally. No pleural effusion. No new or progressed lung opacity. Some underlying chronic subpleural lung scarring, with perhaps early  honeycombing at both costophrenic angles. Subtle/early mosaic attenuation was present on a 2012 chest CT. Upper Abdomen: Mild contrast reflux from the right atrium into the hepatic IVC and hepatic veins. Otherwise stable visible liver. Negative visible spleen and bowel. Stable visible adrenal glands, kidneys, gallbladder. Musculoskeletal: Stable. No acute or suspicious osseous lesion identified. Review of the MIP images confirms the above findings.  IMPRESSION: 1. Negative for acute pulmonary embolus. And resolved right lung thrombus seen in May. 2. Unchanged appearance of pronounced bilateral pulmonary mosaic attenuation since May. Now favor chronic small vessel or chronic small airway disease, and there is some evidence of underlying chronic subpleural lung scarring - possible early honeycombing at the lung bases. No pleural effusion or new pulmonary abnormality. 3. Stable mild-to-moderate cardiomegaly, reactive appearing mediastinal lymph nodes. Electronically Signed   By: Genevie Ann M.D.   On: 07/13/2022 05:44   DG Chest Portable 1 View  Result Date: 07/13/2022 CLINICAL DATA:  Cough EXAM: PORTABLE CHEST 1 VIEW COMPARISON:  Chest radiograph dated 03/20/2022. CT chest dated 01/28/2022. FINDINGS: Increased interstitial markings, left upper lobe predominant, compatible with interstitial lung disease when correlating with prior CT. No pleural effusion or pneumothorax. The heart is normal in size.  Thoracic aortic atherosclerosis. IMPRESSION: Stable chronic interstitial lung disease. No evidence of acute cardiopulmonary disease. Electronically Signed   By: Julian Hy M.D.   On: 07/13/2022 03:45

## 2022-11-09 ENCOUNTER — Emergency Department: Payer: 59

## 2022-11-09 ENCOUNTER — Other Ambulatory Visit: Payer: Self-pay

## 2022-11-09 DIAGNOSIS — R0902 Hypoxemia: Secondary | ICD-10-CM | POA: Diagnosis present

## 2022-11-09 DIAGNOSIS — Z79620 Long term (current) use of immunosuppressive biologic: Secondary | ICD-10-CM

## 2022-11-09 DIAGNOSIS — Z7901 Long term (current) use of anticoagulants: Secondary | ICD-10-CM

## 2022-11-09 DIAGNOSIS — M069 Rheumatoid arthritis, unspecified: Secondary | ICD-10-CM | POA: Diagnosis present

## 2022-11-09 DIAGNOSIS — I252 Old myocardial infarction: Secondary | ICD-10-CM

## 2022-11-09 DIAGNOSIS — Z79899 Other long term (current) drug therapy: Secondary | ICD-10-CM

## 2022-11-09 DIAGNOSIS — I110 Hypertensive heart disease with heart failure: Principal | ICD-10-CM | POA: Diagnosis present

## 2022-11-09 DIAGNOSIS — I959 Hypotension, unspecified: Secondary | ICD-10-CM | POA: Diagnosis not present

## 2022-11-09 DIAGNOSIS — G4733 Obstructive sleep apnea (adult) (pediatric): Secondary | ICD-10-CM | POA: Diagnosis present

## 2022-11-09 DIAGNOSIS — Z86711 Personal history of pulmonary embolism: Secondary | ICD-10-CM

## 2022-11-09 DIAGNOSIS — J4489 Other specified chronic obstructive pulmonary disease: Secondary | ICD-10-CM | POA: Diagnosis present

## 2022-11-09 DIAGNOSIS — I5043 Acute on chronic combined systolic (congestive) and diastolic (congestive) heart failure: Secondary | ICD-10-CM | POA: Diagnosis present

## 2022-11-09 DIAGNOSIS — Z881 Allergy status to other antibiotic agents status: Secondary | ICD-10-CM

## 2022-11-09 DIAGNOSIS — Z833 Family history of diabetes mellitus: Secondary | ICD-10-CM

## 2022-11-09 DIAGNOSIS — I25119 Atherosclerotic heart disease of native coronary artery with unspecified angina pectoris: Secondary | ICD-10-CM | POA: Diagnosis present

## 2022-11-09 DIAGNOSIS — I700 Atherosclerosis of aorta: Secondary | ICD-10-CM | POA: Diagnosis present

## 2022-11-09 DIAGNOSIS — Z8249 Family history of ischemic heart disease and other diseases of the circulatory system: Secondary | ICD-10-CM

## 2022-11-09 DIAGNOSIS — E785 Hyperlipidemia, unspecified: Secondary | ICD-10-CM | POA: Diagnosis present

## 2022-11-09 DIAGNOSIS — Z87891 Personal history of nicotine dependence: Secondary | ICD-10-CM

## 2022-11-09 DIAGNOSIS — Z888 Allergy status to other drugs, medicaments and biological substances status: Secondary | ICD-10-CM

## 2022-11-09 DIAGNOSIS — Z91040 Latex allergy status: Secondary | ICD-10-CM

## 2022-11-09 DIAGNOSIS — I2699 Other pulmonary embolism without acute cor pulmonale: Secondary | ICD-10-CM | POA: Diagnosis present

## 2022-11-09 DIAGNOSIS — Z8616 Personal history of COVID-19: Secondary | ICD-10-CM

## 2022-11-09 DIAGNOSIS — Z823 Family history of stroke: Secondary | ICD-10-CM

## 2022-11-09 DIAGNOSIS — D72819 Decreased white blood cell count, unspecified: Secondary | ICD-10-CM | POA: Diagnosis present

## 2022-11-09 DIAGNOSIS — Z91199 Patient's noncompliance with other medical treatment and regimen due to unspecified reason: Secondary | ICD-10-CM

## 2022-11-09 DIAGNOSIS — K219 Gastro-esophageal reflux disease without esophagitis: Secondary | ICD-10-CM | POA: Diagnosis present

## 2022-11-09 DIAGNOSIS — Z66 Do not resuscitate: Secondary | ICD-10-CM | POA: Diagnosis not present

## 2022-11-09 DIAGNOSIS — I340 Nonrheumatic mitral (valve) insufficiency: Secondary | ICD-10-CM | POA: Diagnosis present

## 2022-11-09 DIAGNOSIS — I428 Other cardiomyopathies: Secondary | ICD-10-CM | POA: Diagnosis present

## 2022-11-09 DIAGNOSIS — R079 Chest pain, unspecified: Secondary | ICD-10-CM | POA: Diagnosis not present

## 2022-11-09 NOTE — ED Triage Notes (Signed)
BIB [facility] from home with c/o L sided chest pain that is now resolved. On EMS arrival pt denies cp and stated she did not take any ASA or anything else. EMS reports NSR with them. Pt continues to deny cp but does report nausea. '4mg'$  zofran IM given by EMS and states nausea now improved. Pt alert and oriented on arrival. Pt on 2L [place] chronically   132/77 HR 83 97% 2L [place] RR 18

## 2022-11-09 NOTE — ED Notes (Signed)
Pt now c/o abd pain and cramping.

## 2022-11-10 ENCOUNTER — Emergency Department: Payer: 59

## 2022-11-10 ENCOUNTER — Inpatient Hospital Stay
Admission: EM | Admit: 2022-11-10 | Discharge: 2022-11-12 | DRG: 291 | Disposition: A | Discharge status: Home / Self Care | Payer: 59 | Attending: Internal Medicine | Admitting: Internal Medicine

## 2022-11-10 ENCOUNTER — Inpatient Hospital Stay: Payer: 59

## 2022-11-10 ENCOUNTER — Encounter: Payer: Self-pay | Admitting: Family Medicine

## 2022-11-10 DIAGNOSIS — I110 Hypertensive heart disease with heart failure: Secondary | ICD-10-CM | POA: Diagnosis present

## 2022-11-10 DIAGNOSIS — E785 Hyperlipidemia, unspecified: Secondary | ICD-10-CM | POA: Diagnosis present

## 2022-11-10 DIAGNOSIS — Z7901 Long term (current) use of anticoagulants: Secondary | ICD-10-CM | POA: Diagnosis not present

## 2022-11-10 DIAGNOSIS — Z86711 Personal history of pulmonary embolism: Secondary | ICD-10-CM | POA: Diagnosis present

## 2022-11-10 DIAGNOSIS — J4489 Other specified chronic obstructive pulmonary disease: Secondary | ICD-10-CM | POA: Diagnosis present

## 2022-11-10 DIAGNOSIS — D72819 Decreased white blood cell count, unspecified: Secondary | ICD-10-CM | POA: Diagnosis present

## 2022-11-10 DIAGNOSIS — I25119 Atherosclerotic heart disease of native coronary artery with unspecified angina pectoris: Secondary | ICD-10-CM | POA: Diagnosis present

## 2022-11-10 DIAGNOSIS — R0902 Hypoxemia: Secondary | ICD-10-CM

## 2022-11-10 DIAGNOSIS — Z823 Family history of stroke: Secondary | ICD-10-CM | POA: Diagnosis not present

## 2022-11-10 DIAGNOSIS — Z87891 Personal history of nicotine dependence: Secondary | ICD-10-CM | POA: Diagnosis not present

## 2022-11-10 DIAGNOSIS — Z8249 Family history of ischemic heart disease and other diseases of the circulatory system: Secondary | ICD-10-CM | POA: Diagnosis not present

## 2022-11-10 DIAGNOSIS — Z79899 Other long term (current) drug therapy: Secondary | ICD-10-CM | POA: Diagnosis not present

## 2022-11-10 DIAGNOSIS — I428 Other cardiomyopathies: Secondary | ICD-10-CM | POA: Diagnosis present

## 2022-11-10 DIAGNOSIS — I10 Essential (primary) hypertension: Secondary | ICD-10-CM | POA: Diagnosis present

## 2022-11-10 DIAGNOSIS — I959 Hypotension, unspecified: Secondary | ICD-10-CM | POA: Diagnosis not present

## 2022-11-10 DIAGNOSIS — I252 Old myocardial infarction: Secondary | ICD-10-CM | POA: Diagnosis not present

## 2022-11-10 DIAGNOSIS — R079 Chest pain, unspecified: Secondary | ICD-10-CM | POA: Diagnosis present

## 2022-11-10 DIAGNOSIS — I2699 Other pulmonary embolism without acute cor pulmonale: Secondary | ICD-10-CM | POA: Diagnosis present

## 2022-11-10 DIAGNOSIS — G4733 Obstructive sleep apnea (adult) (pediatric): Secondary | ICD-10-CM | POA: Diagnosis present

## 2022-11-10 DIAGNOSIS — Z66 Do not resuscitate: Secondary | ICD-10-CM | POA: Diagnosis not present

## 2022-11-10 DIAGNOSIS — I700 Atherosclerosis of aorta: Secondary | ICD-10-CM | POA: Diagnosis present

## 2022-11-10 DIAGNOSIS — I5043 Acute on chronic combined systolic (congestive) and diastolic (congestive) heart failure: Secondary | ICD-10-CM | POA: Diagnosis present

## 2022-11-10 DIAGNOSIS — K219 Gastro-esophageal reflux disease without esophagitis: Secondary | ICD-10-CM | POA: Diagnosis present

## 2022-11-10 DIAGNOSIS — Z8616 Personal history of COVID-19: Secondary | ICD-10-CM | POA: Diagnosis not present

## 2022-11-10 DIAGNOSIS — R0789 Other chest pain: Principal | ICD-10-CM

## 2022-11-10 DIAGNOSIS — I340 Nonrheumatic mitral (valve) insufficiency: Secondary | ICD-10-CM | POA: Diagnosis present

## 2022-11-10 DIAGNOSIS — M069 Rheumatoid arthritis, unspecified: Secondary | ICD-10-CM | POA: Diagnosis present

## 2022-11-10 DIAGNOSIS — R0602 Shortness of breath: Secondary | ICD-10-CM

## 2022-11-10 HISTORY — DX: Chest pain, unspecified: R07.9

## 2022-11-10 LAB — BASIC METABOLIC PANEL
Anion gap: 9 (ref 5–15)
Anion gap: 9 (ref 5–15)
BUN: 17 mg/dL (ref 8–23)
BUN: 18 mg/dL (ref 8–23)
CO2: 22 mmol/L (ref 22–32)
CO2: 24 mmol/L (ref 22–32)
Calcium: 8.3 mg/dL — ABNORMAL LOW (ref 8.9–10.3)
Calcium: 8.5 mg/dL — ABNORMAL LOW (ref 8.9–10.3)
Chloride: 104 mmol/L (ref 98–111)
Chloride: 105 mmol/L (ref 98–111)
Creatinine, Ser: 0.92 mg/dL (ref 0.44–1.00)
Creatinine, Ser: 0.97 mg/dL (ref 0.44–1.00)
GFR, Estimated: 59 mL/min — ABNORMAL LOW (ref >60)
GFR, Estimated: >60 mL/min (ref >60)
Glucose, Bld: 114 mg/dL — ABNORMAL HIGH (ref 70–99)
Glucose, Bld: 95 mg/dL (ref 70–99)
Potassium: 3.6 mmol/L (ref 3.5–5.1)
Potassium: 3.8 mmol/L (ref 3.5–5.1)
Sodium: 136 mmol/L (ref 135–145)
Sodium: 137 mmol/L (ref 135–145)

## 2022-11-10 LAB — CBC
HCT: 43 % (ref 36.0–46.0)
HCT: 43.5 % (ref 36.0–46.0)
Hemoglobin: 13.4 g/dL (ref 12.0–15.0)
Hemoglobin: 13.5 g/dL (ref 12.0–15.0)
MCH: 31.9 pg (ref 26.0–34.0)
MCH: 32.3 pg (ref 26.0–34.0)
MCHC: 31 g/dL (ref 30.0–36.0)
MCHC: 31.2 g/dL (ref 30.0–36.0)
MCV: 102.8 fL — ABNORMAL HIGH (ref 80.0–100.0)
MCV: 103.6 fL — ABNORMAL HIGH (ref 80.0–100.0)
Platelets: 216 10*3/uL (ref 150–400)
Platelets: 227 10*3/uL (ref 150–400)
RBC: 4.15 MIL/uL (ref 3.87–5.11)
RBC: 4.23 MIL/uL (ref 3.87–5.11)
RDW: 14.2 % (ref 11.5–15.5)
RDW: 14.4 % (ref 11.5–15.5)
WBC: 3.5 10*3/uL — ABNORMAL LOW (ref 4.0–10.5)
WBC: 3.8 10*3/uL — ABNORMAL LOW (ref 4.0–10.5)
nRBC: 0 % (ref 0.0–0.2)
nRBC: 0 % (ref 0.0–0.2)

## 2022-11-10 LAB — LIPASE, BLOOD: Lipase: 37 U/L (ref 11–51)

## 2022-11-10 LAB — BRAIN NATRIURETIC PEPTIDE: B Natriuretic Peptide: 926.9 pg/mL — ABNORMAL HIGH (ref 0.0–100.0)

## 2022-11-10 LAB — TROPONIN I (HIGH SENSITIVITY)
Troponin I (High Sensitivity): 10 ng/L (ref <18)
Troponin I (High Sensitivity): 10 ng/L (ref <18)
Troponin I (High Sensitivity): 13 ng/L (ref <18)

## 2022-11-10 MED ORDER — FLUTICASONE PROPIONATE 50 MCG/ACT NA SUSP: 2.0000 | Freq: Every day | NASAL | Status: DC | PRN

## 2022-11-10 MED ORDER — ATORVASTATIN CALCIUM 20 MG PO TABS
40.0000 mg | ORAL_TABLET | Freq: Every morning | ORAL | Status: DC
  Administered 2022-11-10 – 2022-11-12 (×3): 40 mg via ORAL
  Filled 2022-11-10 (×3): qty 2

## 2022-11-10 MED ORDER — ALUM & MAG HYDROXIDE-SIMETH 200-200-20 MG/5ML PO SUSP: 30.0000 mL | Freq: Once | ORAL | Status: DC

## 2022-11-10 MED ORDER — FUROSEMIDE 10 MG/ML IJ SOLN
60.0000 mg | Freq: Once | INTRAMUSCULAR | Status: AC
  Administered 2022-11-10: 60 mg via INTRAVENOUS
  Filled 2022-11-10: qty 8

## 2022-11-10 MED ORDER — ONDANSETRON HCL 4 MG PO TABS: 4.0000 mg | ORAL_TABLET | Freq: Four times a day (QID) | ORAL | Status: DC | PRN

## 2022-11-10 MED ORDER — VITAMIN D (ERGOCALCIFEROL) 1.25 MG (50000 UNIT) PO CAPS: 50000.0000 [IU] | ORAL_CAPSULE | ORAL | Status: DC

## 2022-11-10 MED ORDER — POLYETHYLENE GLYCOL 3350 17 GM/SCOOP PO POWD: 17.0000 g | Freq: Every day | ORAL | Status: DC | PRN

## 2022-11-10 MED ORDER — TECHNETIUM TC 99M TETROFOSMIN IV KIT
30.0000 | PACK | Freq: Once | INTRAVENOUS | Status: AC
  Administered 2022-11-10: 31.05 via INTRAVENOUS
  Filled 2022-11-10: qty 30

## 2022-11-10 MED ORDER — MAGNESIUM HYDROXIDE 400 MG/5ML PO SUSP: 30.0000 mL | Freq: Every day | ORAL | Status: DC | PRN

## 2022-11-10 MED ORDER — MORPHINE SULFATE (PF) 2 MG/ML IV SOLN: 1.0000 mg | INTRAVENOUS | Status: DC | PRN

## 2022-11-10 MED ORDER — APIXABAN 2.5 MG PO TABS
2.5000 mg | ORAL_TABLET | Freq: Two times a day (BID) | ORAL | Status: DC
  Administered 2022-11-10 – 2022-11-12 (×5): 2.5 mg via ORAL
  Filled 2022-11-10 (×5): qty 1

## 2022-11-10 MED ORDER — ALBUTEROL SULFATE HFA 108 (90 BASE) MCG/ACT IN AERS: 1.0000 | INHALATION_SPRAY | Freq: Four times a day (QID) | RESPIRATORY_TRACT | Status: DC | PRN

## 2022-11-10 MED ORDER — ONDANSETRON HCL 4 MG/2ML IJ SOLN: 4.0000 mg | Freq: Four times a day (QID) | INTRAMUSCULAR | Status: DC | PRN

## 2022-11-10 MED ORDER — MIDODRINE HCL 5 MG PO TABS
5.0000 mg | ORAL_TABLET | Freq: Three times a day (TID) | ORAL | Status: DC
  Administered 2022-11-10 – 2022-11-12 (×6): 5 mg via ORAL
  Filled 2022-11-10 (×6): qty 1

## 2022-11-10 MED ORDER — SACUBITRIL-VALSARTAN 24-26 MG PO TABS
1.0000 | ORAL_TABLET | Freq: Two times a day (BID) | ORAL | Status: DC
  Administered 2022-11-10 – 2022-11-12 (×5): 1 via ORAL
  Filled 2022-11-10 (×5): qty 1

## 2022-11-10 MED ORDER — ALBUTEROL SULFATE (2.5 MG/3ML) 0.083% IN NEBU: 2.5000 mg | INHALATION_SOLUTION | Freq: Four times a day (QID) | RESPIRATORY_TRACT | Status: DC | PRN

## 2022-11-10 MED ORDER — FOLIC ACID 1 MG PO TABS
1.0000 mg | ORAL_TABLET | Freq: Every day | ORAL | Status: DC
  Administered 2022-11-10 – 2022-11-12 (×3): 1 mg via ORAL
  Filled 2022-11-10 (×3): qty 1

## 2022-11-10 MED ORDER — ISOSORBIDE MONONITRATE ER 30 MG PO TB24
30.0000 mg | ORAL_TABLET | Freq: Every day | ORAL | Status: DC
  Administered 2022-11-10 – 2022-11-12 (×3): 30 mg via ORAL
  Filled 2022-11-10 (×3): qty 1

## 2022-11-10 MED ORDER — ACETAMINOPHEN 325 MG PO TABS
650.0000 mg | ORAL_TABLET | Freq: Four times a day (QID) | ORAL | Status: DC | PRN
  Filled 2022-11-10: qty 2

## 2022-11-10 MED ORDER — MONTELUKAST SODIUM 10 MG PO TABS
10.0000 mg | ORAL_TABLET | Freq: Every day | ORAL | Status: DC
  Administered 2022-11-10 – 2022-11-11 (×2): 10 mg via ORAL
  Filled 2022-11-10 (×2): qty 1

## 2022-11-10 MED ORDER — FUROSEMIDE 10 MG/ML IJ SOLN
40.0000 mg | Freq: Two times a day (BID) | INTRAMUSCULAR | Status: DC
  Administered 2022-11-10 – 2022-11-11 (×3): 40 mg via INTRAVENOUS
  Filled 2022-11-10 (×3): qty 4

## 2022-11-10 MED ORDER — REGADENOSON 0.4 MG/5ML IV SOLN
0.4000 mg | Freq: Once | INTRAVENOUS | Status: AC
  Administered 2022-11-10: 0.4 mg via INTRAVENOUS

## 2022-11-10 MED ORDER — SODIUM CHLORIDE 0.9 % IV BOLUS
500.0000 mL | Freq: Once | INTRAVENOUS | Status: AC
  Administered 2022-11-10: 500 mL via INTRAVENOUS

## 2022-11-10 MED ORDER — VITAMIN B-12 1000 MCG PO TABS
1000.0000 ug | ORAL_TABLET | Freq: Every day | ORAL | Status: DC
  Administered 2022-11-10 – 2022-11-12 (×3): 1000 ug via ORAL
  Filled 2022-11-10 (×3): qty 1

## 2022-11-10 MED ORDER — FUROSEMIDE 10 MG/ML IJ SOLN: 40.0000 mg | Freq: Two times a day (BID) | INTRAMUSCULAR | Status: DC

## 2022-11-10 MED ORDER — TECHNETIUM TC 99M TETROFOSMIN IV KIT
9.7100 | PACK | Freq: Once | INTRAVENOUS | Status: AC | PRN
  Administered 2022-11-10: 9.71 via INTRAVENOUS

## 2022-11-10 MED ORDER — NITROGLYCERIN 0.4 MG SL SUBL: 0.4000 mg | SUBLINGUAL_TABLET | SUBLINGUAL | Status: DC | PRN

## 2022-11-10 MED ORDER — TRAZODONE HCL 50 MG PO TABS: 25.0000 mg | ORAL_TABLET | Freq: Every evening | ORAL | Status: DC | PRN

## 2022-11-10 MED ORDER — ACETAMINOPHEN 650 MG RE SUPP: 650.0000 mg | Freq: Four times a day (QID) | RECTAL | Status: DC | PRN

## 2022-11-10 NOTE — Assessment & Plan Note (Addendum)
Mgmt per Cardiology, Dr. Clayborn Bigness following. Presented with chest pain, mild edema, elevated BNP and chest xray with mild CHF changes. --continue diuresis with IV Lasix 40 mg BID --diuresis per cardiology --strict I/O's and daily weights. - Continue Entresto and Aldactone -- monitor renal function and electrolytes --Echo is ordered and pending Prior echo from May 2023 -- EF 45-50%, with LV WMA's, mild LVH, grade I dd, moderately elevated PASP, mild MR

## 2022-11-10 NOTE — Assessment & Plan Note (Signed)
Seems stable, no active flare --continue home Plaquenil, prednisone and methotrexate --Previously on Humira, appears stopped due to recurrent sinus infections

## 2022-11-10 NOTE — Assessment & Plan Note (Signed)
Continue statin. 

## 2022-11-10 NOTE — ED Notes (Signed)
Mansy, MD at bedside. 

## 2022-11-10 NOTE — ED Provider Notes (Signed)
[facility] Provider Note    Event Date/Time   First MD Initiated Contact with Patient 11/10/22 0122     (approximate)   History   Chest Pain   HPI  Tina Fields is a 77 y.o. female who presents to the ED for evaluation of Chest Pain   I review a cardiology clinic visit from November.  History of diastolic dysfunction, COPD, HTN, CAD, OSA, PE and HLD.  Currently anticoagulated on Eliquis.  Patient presents to the ED for evaluation of chest discomfort and dyspnea.  She reports symptoms when supine primarily including her pain and orthopnea.  Also reports feeling winded and short of breath when getting around but denies any chest discomfort with exertion.  Denies chest pain now and reports feeling better.  No syncope, fevers or falls  Physical Exam   Triage Vital Signs: ED Triage Vitals  Enc Vitals Group     BP 11/09/22 2345 113/74     Pulse Rate 11/09/22 2345 85     Resp 11/09/22 2345 18     Temp 11/09/22 2345 97.8 F (36.6 C)     Temp Source 11/09/22 2345 Oral     SpO2 11/09/22 2345 95 %     Weight 11/09/22 2344 171 lb (77.6 kg)     Height 11/09/22 2344 '5\' 6"'$  (1.676 m)     Head Circumference --      Peak Flow --      Pain Score 11/09/22 2344 0     Pain Loc --      Pain Edu? --      Excl. in [place]? --     Most recent vital signs: Vitals:   11/09/22 2345 11/10/22 0129  BP: 113/74 126/71  Pulse: 85 80  Resp: 18 14  Temp: 97.8 F (36.6 C) 97.9 F (36.6 C)  SpO2: 95% 97%    General: Awake, no distress.  CV:  Good peripheral perfusion.  Resp:  Normal effort.  Abd:  No distention.  MSK:  No deformity noted.  Trace bilateral pitting edema Neuro:  No focal deficits appreciated. Other:     ED Results / Procedures / Treatments   Labs (all labs ordered are listed, but only abnormal results are displayed) Labs Reviewed  BASIC METABOLIC PANEL - Abnormal; Notable for the following components:      Result Value   Glucose, Bld 114  (*)    Calcium 8.5 (*)    All other components within normal limits  CBC - Abnormal; Notable for the following components:   WBC 3.5 (*)    MCV 103.6 (*)    All other components within normal limits  BRAIN NATRIURETIC PEPTIDE - Abnormal; Notable for the following components:   B Natriuretic Peptide 926.9 (*)    All other components within normal limits  LIPASE, BLOOD  URINALYSIS, ROUTINE W REFLEX MICROSCOPIC  TROPONIN I (HIGH SENSITIVITY)  TROPONIN I (HIGH SENSITIVITY)    EKG Sinus rhythm with sinus arrhythmia, rate of 93 bpm.  Normal axis and intervals.  Nonspecific ST changes laterally and inferiorly without clear signs of acute ischemia.  No STEMI.  RADIOLOGY 2 view CXR interpreted by me with pulmonary vascular congestion without pleural effusion or discrete filtration  Official radiology report(s): DG Chest 2 View  Result Date: 11/10/2022 CLINICAL DATA:  Chest pain EXAM: CHEST - 2 VIEW COMPARISON:  07/28/2022 FINDINGS: Cardiac shadow is mildly enlarged. Aortic calcifications are again seen. Mild vascular congestion is noted with mild  interstitial edema stable from the prior exam. No new focal infiltrate or effusion is noted. No bony abnormality is seen. IMPRESSION: Mild CHF Electronically Signed   By: Inez Catalina M.D.   On: 11/10/2022 00:16    PROCEDURES and INTERVENTIONS:  .Critical Care  Performed by: Vladimir Crofts, MD Authorized by: Vladimir Crofts, MD   Critical care provider statement:    Critical care time (minutes):  30   Critical care time was exclusive of:  Separately billable procedures and treating other patients   Critical care was necessary to treat or prevent imminent or life-threatening deterioration of the following conditions:  Respiratory failure   Critical care was time spent personally by me on the following activities:  Development of treatment plan with patient or surrogate, discussions with consultants, evaluation of patient's response to treatment,  examination of patient, ordering and review of laboratory studies, ordering and review of radiographic studies, ordering and performing treatments and interventions, pulse oximetry, re-evaluation of patient's condition and review of old charts .1-3 Lead EKG Interpretation  Performed by: Vladimir Crofts, MD Authorized by: Vladimir Crofts, MD     Interpretation: normal     ECG rate:  80   ECG rate assessment: normal     Rhythm: sinus rhythm     Ectopy: none     Conduction: normal     Medications  furosemide (LASIX) injection 60 mg (60 mg Intravenous Given 11/10/22 0240)     IMPRESSION / MDM / ASSESSMENT AND PLAN / ED COURSE  I reviewed the triage vital signs and the nursing notes.  Differential diagnosis includes, but is not limited to, ACS, PTX, PNA, muscle strain/spasm, PE, dissection  {Patient presents with symptoms of an acute illness or injury that is potentially life-threatening.  77 year old woman presents with positional chest pain and dyspnea, with evidence of a CHF exacerbation requiring medical admission.  Initially on her room air and stable, but does develop hypoxia during her ED course necessitating nasal cannula.  Her CXR is congested and she does look volume overloaded clinically.  Also has an elevated BNP.  Negative troponins, normal metabolic panel and lipase and CBC.  We will initiate diuresis and consult medicine for admission.  Clinical Course as of 11/10/22 0349  Fri Nov 10, 2022  0341 Hypoxic to the low 80s with a good waveform.  We will place her on supplemental oxygen.  I urged her to stay for admission and she is agreeable [DS]    Clinical Course User Index [DS] Vladimir Crofts, MD     FINAL CLINICAL IMPRESSION(S) / ED DIAGNOSES   Final diagnoses:  Other chest pain  Shortness of breath  Hypoxia     Rx / DC Orders   ED Discharge Orders     None        Note:  This document was prepared using Dragon voice recognition software and may include  unintentional dictation errors.   Vladimir Crofts, MD 11/10/22 [phone]

## 2022-11-10 NOTE — Assessment & Plan Note (Signed)
--  Continue her hypertensives

## 2022-11-10 NOTE — ED Notes (Signed)
Pt reports no chest pain at this time.  

## 2022-11-10 NOTE — H&P (Signed)
[place]   PATIENT NAME: Tina Fields    MR#:  [id number]  DATE OF BIRTH:  12/19/1945  DATE OF ADMISSION:  11/10/2022  PRIMARY CARE PHYSICIAN: Letta Median, MD   Patient is coming from: Home  REQUESTING/REFERRING PHYSICIAN: Vladimir Crofts, MD  CHIEF COMPLAINT:   Chief Complaint  Patient presents with   Chest Pain    HISTORY OF PRESENT ILLNESS:  Tina Fields is a 77 y.o. Caucasian female with medical history significant for asthma, systolic and diastolic CHF, GERD, essential hypertension, history of PE on Eliquis, OSA and rheumatoid arthritis, who presented to the emergency room with acute onset of chest pain with associated dyspnea as well as orthopnea with dyspnea on exertion.  She graded her pain as 4/10 in severity and describes it as a sharp pain without radiation however with nausea without vomiting or diaphoresis.  She admits to orthopnea and occasional paroxysmal nocturnal dyspnea as well as dyspnea on exertion without significant cough or wheezing.  No fever or chills.  No abdominal pain or melena or bright red bleeding per rectum.  No dysuria, oliguria or hematuria or flank pain.  ED Course: When he came to the ER, vital signs were within normal.  Pulse symmetry dropped to 85% on room air.  Labs revealed unremarkable CMP.  BNP was 926 and high sensitive troponin I was 10 twice.  CBC showed mild leukopenia 3.5 and macrocytosis. EKG as reviewed by me : EKG showed normal sinus rhythm with a rate of 93 with sinus arrhythmia and minimal voltage criteria for LVH with T wave inversion laterally. Imaging: 2 view chest x-ray showed mild CHF.  The patient was given 60 mg of IV Lasix.  She will be admitted to a cardiac telemetry bed for further evaluation and management. PAST MEDICAL HISTORY:   Past Medical History:  Diagnosis Date   Aortic atherosclerosis (HCC)    Asthma    CHF (congestive heart failure) (HCC)    Coronary artery disease    Dyspnea     Edema of both legs    GERD (gastroesophageal reflux disease)    HFrEF (heart failure with reduced ejection fraction) ([place])    History of 2019 novel coronavirus disease (COVID-19) 10/28/2020   Hypertension    Myocardial infarction (HCC)    OSA (obstructive sleep apnea)    noncompliant with nocturnal PAP therapy   RA (rheumatoid arthritis) ([place])     PAST SURGICAL HISTORY:   Past Surgical History:  Procedure Laterality Date   BREAST SURGERY     CARDIAC CATHETERIZATION Left 06/21/2005   Moderate ostial LCx and LAD disease; LVEF 45%; Location: [place] [facility]; Surgeon: Neoma Laming, MD   CARDIAC CATHETERIZATION Left 05/20/2008   No occlusive CAD; LVEF 65%; Location: [place]; Surgeon: Katrine Coho, MD   COLONOSCOPY     ESOPHAGOGASTRODUODENOSCOPY     TUBAL LIGATION      SOCIAL HISTORY:   Social History   Tobacco Use   Smoking status: Former    Types: Cigarettes    Quit date: 01/09/1995    Years since quitting: 27.8   Smokeless tobacco: Never  Substance Use Topics   Alcohol use: No    FAMILY HISTORY:   Family History  Problem Relation Age of Onset   CAD Mother    Diabetes Mother    Hypertension Mother    CAD Father    Diabetes Father    Hypertension Father    Stroke Father     DRUG ALLERGIES:  Allergies  Allergen Reactions   Ace Inhibitors Cough    Other reaction(s): Cough    Hydrochlorothiazide Other (See Comments)    Cramps   Latex Hives   Azithromycin Rash    REVIEW OF SYSTEMS:   ROS As per history of present illness. All pertinent systems were reviewed above. Constitutional, HEENT, cardiovascular, respiratory, GI, GU, musculoskeletal, neuro, psychiatric, endocrine, integumentary and hematologic systems were reviewed and are otherwise negative/unremarkable except for positive findings mentioned above in the HPI.   MEDICATIONS AT HOME:   Prior to Admission medications   Medication Sig Start Date End Date Taking? Authorizing Provider  acetaminophen (TYLENOL)  650 MG CR tablet Take 1,300 mg by mouth as needed for pain.    [provider]  albuterol (PROVENTIL) (2.5 MG/3ML) 0.083% nebulizer solution Take 2.5 mg by nebulization every 6 (six) hours as needed for wheezing or shortness of breath.    [provider]  albuterol (VENTOLIN HFA) 108 (90 Base) MCG/ACT inhaler Inhale 1-2 puffs into the lungs every 6 (six) hours as needed for wheezing or shortness of breath. 11/06/20   [provider]  apixaban (ELIQUIS) 2.5 MG TABS tablet Take 1 tablet (2.5 mg total) by mouth 2 (two) times daily. 08/08/22   Earlie Server, MD  atorvastatin (LIPITOR) 40 MG tablet Take 1 tablet (40 mg total) by mouth every morning. 12/05/21   Kayleen Memos, DO  [facility] VITAMIN B12 1000 MCG TBCR Take 1,000 mcg by mouth daily. 12/25/21   [provider]  ergocalciferol (VITAMIN D2) 1.25 MG (50000 UT) capsule Take 50,000 Units by mouth once a week. Every monday    [provider]  fluticasone (FLONASE) 50 MCG/ACT nasal spray Place 2 sprays into the nose daily as needed for allergies. 12/03/18   [provider]  folic acid (FOLVITE) 1 MG tablet Take 1 mg by mouth daily. 09/19/21   [provider]  furosemide (LASIX) 20 MG tablet Take 1 tablet (20 mg total) by mouth daily. 12/05/21   Hall, Carole N, DO  HUMIRA PEN 40 MG/0.4ML PNKT Inject 40 mg into the skin every 14 (fourteen) days. Patient not taking: Reported on 03/13/2022 01/16/22   [provider]  isosorbide mononitrate (IMDUR) 30 MG 24 hr tablet Take 30 mg by mouth daily.    [provider]  MIRALAX 17 GM/SCOOP powder Take 17 g by mouth daily as needed for moderate constipation. Dissolve 17 gm in 8 ounces of water and drink 01/11/21   [provider]  montelukast (SINGULAIR) 10 MG tablet Take 10 mg by mouth at bedtime.    [provider]  sacubitril-valsartan (ENTRESTO) 24-26 MG Take 1 tablet by mouth 2 (two) times daily.    [provider]       VITAL SIGNS:  Blood pressure 109/73, pulse 90, temperature 97.9 F (36.6 C), temperature source Oral, resp. rate (!) 23, height '5\' 6"'$  (1.676 m), weight 77.6 kg, SpO2 (!) 85 %.  PHYSICAL EXAMINATION:  Physical Exam  GENERAL:  77 y.o.-year-old Caucasian female patient lying in the bed with no acute distress.  EYES: Pupils equal, round, reactive to light and accommodation. No scleral icterus. Extraocular muscles intact.  HEENT: Head atraumatic, normocephalic. Oropharynx and nasopharynx clear.  NECK:  Supple, no jugular venous distention. No thyroid enlargement, no tenderness.  LUNGS: Diminished bibasilar breath sounds with bibasal rales.  No use of accessory muscles of respiration.  CARDIOVASCULAR: Regular rate and rhythm, S1, S2 normal. No murmurs, rubs, or gallops.  ABDOMEN: Soft, nondistended, nontender. Bowel sounds present. No organomegaly or mass.  EXTREMITIES: 1+ bilateral lower extremity pitting edema, with no cyanosis, or clubbing.  NEUROLOGIC: Cranial nerves II through XII are intact. Muscle strength 5/5 in all extremities. Sensation intact. Gait not checked.  PSYCHIATRIC: The patient is alert and oriented x 3.  Normal affect and good eye contact. SKIN: No obvious rash, lesion, or ulcer.   LABORATORY PANEL:   CBC Recent Labs  Lab 11/09/22 2352  WBC 3.5*  HGB 13.4  HCT 43.0  PLT 227   ------------------------------------------------------------------------------------------------------------------  Chemistries  Recent Labs  Lab 11/09/22 2352  NA 136  K 3.6  CL 105  CO2 22  GLUCOSE 114*  BUN 17  CREATININE 0.92  CALCIUM 8.5*   ------------------------------------------------------------------------------------------------------------------  Cardiac Enzymes No results for input(s): "TROPONINI" in the last 168 hours. ------------------------------------------------------------------------------------------------------------------  RADIOLOGY:  DG Chest 2  View  Result Date: 11/10/2022 CLINICAL DATA:  Chest pain EXAM: CHEST - 2 VIEW COMPARISON:  07/28/2022 FINDINGS: Cardiac shadow is mildly enlarged. Aortic calcifications are again seen. Mild vascular congestion is noted with mild interstitial edema stable from the prior exam. No new focal infiltrate or effusion is noted. No bony abnormality is seen. IMPRESSION: Mild CHF Electronically Signed   By: Inez Catalina M.D.   On: 11/10/2022 00:16      IMPRESSION AND PLAN:  Assessment and Plan: * Acute on chronic combined CHF  -The patient will be admitted to a cardiac telemetry bed. - We will continue diuresis with IV Lasix. - We Will follow serial troponins. - We will follow I's and O's and daily weights. - Cardiology consult be obtained. - I notified Dr. Clayborn Bigness about the patient. - We will continue Entresto and Aldactone   Chest pain - We will follow serial troponins. - She will be placed on aspirin and as needed sublingual nitroglycerin and IV morphine sulfate for pain. - Cardiology consult to be obtained as mentioned above.  Essential hypertension - We will continue her at hypertensives.  Dyslipidemia - We will continue statin therapy.  COPD with asthma - We will continue Singulair and as needed albuterol.  Rheumatoid arthritis, unspecified ([place]) - We will continue Plaquenil and methotrexate.  The patient is getting Humira every 3 months.  History of pulmonary embolism - We will continue Eliquis.   DVT prophylaxis: Eliquis. Advanced Care Planning:  Code Status: full code.  Family Communication:  The plan of care was discussed in details with the patient (and family). I answered all questions. The patient agreed to proceed with the above mentioned plan. Further management will depend upon hospital course. Disposition Plan: Back to previous home environment Consults called: Cardiology. All the records are reviewed and case discussed with ED provider.  Status is:  Inpatient   At the time of the admission, it appears that the appropriate admission status for this patient is inpatient.  This is judged to be reasonable and necessary in order to provide the required intensity of service to ensure the patient's safety given the presenting symptoms, physical exam findings and initial radiographic and laboratory data in the context of comorbid conditions.  The patient requires inpatient status due to high intensity of service, high risk of further deterioration and high frequency of surveillance required.  I certify that at the time of admission, it is my clinical judgment that the patient will require inpatient hospital care extending more than 2 midnights.  Dispo: The patient is from: Home              Anticipated d/c is to: Home              Patient currently is not medically stable to d/c.              Difficult to place patient: No  Christel Mormon M.D on 11/10/2022 at 6:28 AM  [facility]   From 7 PM-7 AM, contact night-coverage [email or web address]  CC: Primary care physician; Letta Median, MD

## 2022-11-10 NOTE — Consult Note (Signed)
CARDIOLOGY CONSULT NOTE               Patient ID: Tina Fields MRN: [id number] DOB/AGE: [birth year] 77 y.o.  Admit date: 11/10/2022 Referring Physician Dr. Garner Gavel hospitalist Primary Physician Dr. Rutherford Guys primary Primary Cardiologist Dr. Lawerance Cruel, PA Reason for Consultation shortness of breath congestive heart failure possible angina  HPI: Patient is a 77 year old female history of asthma COPD congestive heart failure systolic and diastolic dysfunction GERD hypertension previous PE on Eliquis obstructive sleep apnea noncompliant with CPAP rheumatoid arthritis previous history of smoking hyperlipidemia hypoxemia presented with worsening shortness of breath and chest discomfort she has had some mild lower extremity edema she describes some sharp pain without radiation nausea but no vomiting or diaphoresis she has had orthopnea PND especially with exertion.  Patient was brought to emergency room found to have elevated BNP with sats in the 80s she has oxygen at home which she uses as needed chest x-ray suggested possible heart failure patient had not experienced any recent chest pain and had a cath done in remote past which showed insignificant disease patient now presents for further assessment chest pain improved EKG was nondiagnostic  Review of systems complete and found to be negative unless listed above     Past Medical History:  Diagnosis Date   Aortic atherosclerosis (HCC)    Asthma    CHF (congestive heart failure) (HCC)    Coronary artery disease    Dyspnea    Edema of both legs    GERD (gastroesophageal reflux disease)    HFrEF (heart failure with reduced ejection fraction) ([place])    History of 2019 novel coronavirus disease (COVID-19) 10/28/2020   Hypertension    Myocardial infarction ([place])    OSA (obstructive sleep apnea)    noncompliant with nocturnal PAP therapy   RA (rheumatoid arthritis) ([place])     Past Surgical History:  Procedure  Laterality Date   BREAST SURGERY     CARDIAC CATHETERIZATION Left 06/21/2005   Moderate ostial LCx and LAD disease; LVEF 45%; Location: [place]; Surgeon: Neoma Laming, MD   CARDIAC CATHETERIZATION Left 05/20/2008   No occlusive CAD; LVEF 65%; Location: [facility]; Surgeon: Katrine Coho, MD   COLONOSCOPY     ESOPHAGOGASTRODUODENOSCOPY     TUBAL LIGATION      (Not in a hospital admission)  Social History   Socioeconomic History   Marital status: Widowed    Spouse name: Not on file   Number of children: Not on file   Years of education: Not on file   Highest education level: Not on file  Occupational History   Not on file  Tobacco Use   Smoking status: Former    Types: Cigarettes    Quit date: 01/09/1995    Years since quitting: 27.8   Smokeless tobacco: Never  Vaping Use   Vaping Use: Never used  Substance and Sexual Activity   Alcohol use: No   Drug use: No   Sexual activity: Yes    Birth control/protection: None  Other Topics Concern   Not on file  Social History Narrative   Adult son   Social Determinants of Health   Financial Resource Strain: Unknown (08/21/2017)   Overall Financial Resource Strain (CARDIA)    Difficulty of Paying Living Expenses: Patient refused  Food Insecurity: Unknown (08/21/2017)   Hunger Vital Sign    Worried About Running Out of Food in the Last Year: Patient refused    [place] in the  Last Year: Patient refused  Transportation Needs: Unknown (08/21/2017)   PRAPARE - Transportation    Lack of Transportation (Medical): Patient refused    Lack of Transportation (Non-Medical): Patient refused  Physical Activity: Unknown (08/21/2017)   Exercise Vital Sign    Days of Exercise per Week: Patient refused    Minutes of Exercise per Session: Patient refused  Stress: Unknown (08/21/2017)   [place]    Feeling of Stress : Patient refused  Social Connections: Unknown  (08/21/2017)   Social Connection and Isolation Panel [NHANES]    Frequency of Communication with Friends and Family: Patient refused    Frequency of Social Gatherings with Friends and Family: Patient refused    Attends Religious Services: Patient refused    Active Member of Clubs or Organizations: Patient refused    Attends Archivist Meetings: Patient refused    Marital Status: Patient refused  Intimate Partner Violence: Unknown (08/21/2017)   Humiliation, Afraid, Rape, and Kick questionnaire    Fear of Current or Ex-Partner: Patient refused    Emotionally Abused: Patient refused    Physically Abused: Patient refused    Sexually Abused: Patient refused    Family History  Problem Relation Age of Onset   CAD Mother    Diabetes Mother    Hypertension Mother    CAD Father    Diabetes Father    Hypertension Father    Stroke Father       Review of systems complete and found to be negative unless listed above      PHYSICAL EXAM  General: Well developed, well nourished, in no acute distress HEENT:  Normocephalic and atramatic Neck:  No JVD.  Lungs: Clear bilaterally to auscultation and percussion. Heart: HRRR . Normal S1 and S2 without gallops or 2/6 sem murmurs.  Abdomen: Bowel sounds are positive, abdomen soft and non-tender  Msk:  Back normal, normal gait. Normal strength and tone for age. Extremities: No clubbing, cyanosis or 2+edema.   Neuro: Alert and oriented X 3. Psych:  Good affect, responds appropriately  Labs:   Lab Results  Component Value Date   WBC 3.8 (L) 11/10/2022   HGB 13.5 11/10/2022   HCT 43.5 11/10/2022   MCV 102.8 (H) 11/10/2022   PLT 216 11/10/2022    Recent Labs  Lab 11/10/22 0644  NA 137  K 3.8  CL 104  CO2 24  BUN 18  CREATININE 0.97  CALCIUM 8.3*  GLUCOSE 95   Lab Results  Component Value Date   CKTOTAL 150 03/15/2013   CKMB 1.3 03/15/2013   TROPONINI <0.03 09/12/2018    Lab Results  Component Value Date   CHOL  154 03/03/2021   CHOL 174 08/21/2017   Lab Results  Component Value Date   HDL 27 (L) 03/03/2021   HDL 28 (L) 08/21/2017   Lab Results  Component Value Date   LDLCALC 111 (H) 03/03/2021   LDLCALC 108 (H) 08/21/2017   Lab Results  Component Value Date   TRIG 82 03/03/2021   TRIG 190 (H) 08/21/2017   Lab Results  Component Value Date   CHOLHDL 5.7 03/03/2021   CHOLHDL 6.2 08/21/2017   No results found for: "LDLDIRECT"    Radiology: DG Chest 2 View  Result Date: 11/10/2022 CLINICAL DATA:  Chest pain EXAM: CHEST - 2 VIEW COMPARISON:  07/28/2022 FINDINGS: Cardiac shadow is mildly enlarged. Aortic calcifications are again seen. Mild vascular congestion is noted with mild interstitial  edema stable from the prior exam. No new focal infiltrate or effusion is noted. No bony abnormality is seen. IMPRESSION: Mild CHF Electronically Signed   By: Inez Catalina M.D.   On: 11/10/2022 00:16    EKG: Normal sinus rhythm rate of 75 nonspecific ST-T wave changes  ASSESSMENT AND PLAN:  Acute on chronic congestive heart failure Chest pain possible angina Shortness of breath COPD with hypoxemia Hypertension Rheumatoid arthritis History of pulmonary embolus Known insignificant coronary disease History of obstructive sleep apnea . Plan Agree with admit rule out myocardial infarction follow-up EKGs and troponins  ARB Arni Lasix beta-blocker spironolactone if able to tolerate Consider repeat echocardiogram for assessment of ventricular function wall motion and valvular structures for assessment of heart failure and shortness of breath Functional study for assessment of ischemia Chest pain angina possibly continue Imdur beta-blocker ARB/ARNI statin Patient has history obstructive apnea but noncompliant with CPAP encourage patient to perform sleep study institute CPAP if indicated History of smoking but quit continue to watch patient refrain from tobacco abuse Inhalers supplemental oxygen diuretics  for shortness of breath heart failure Aggressive hypertension management and control Continue statin therapy for hyperlipidemia Maintain Eliquis therapy for anticoagulation for pulmonary embolus Maintain rheumatoid arthritis therapy    Signed: Yolonda Kida MD, 11/10/2022, 7:59 AM

## 2022-11-10 NOTE — Discharge Instructions (Signed)

## 2022-11-10 NOTE — Progress Notes (Signed)
Brief rounding note, same day as admission  HPI: Pt admitted after midnight with chest pain.  See H&P for detailed HPI on admission. Cardiology consulted.  Started on IV diuresis for decompensated heart failure.  Interval history: Pt seen in ED, holding for a bed.  She recently developed 10/10 chest pain she describes to feel like indigestion, substernal, burning, nonradiating.  States she is prone to indigestion.  No other acute complaints.   Exam: General exam: awake, alert, appears in mild distress due to pain HEENT: atraumatic, clear conjunctiva, anicteric sclera, moist mucus membranes, hearing grossly normal  Respiratory system: CTA, no wheezes, rales or rhonchi, normal respiratory effort. Cardiovascular system: normal S1/S2, RRR, trace to 1+ LE edema.   Gastrointestinal system: soft, NT, ND, no HSM felt, +bowel sounds. Central nervous system: A&O x3. no gross focal neurologic deficits, normal speech Extremities: moves all, trace to 1+ BLE edema, normal tone Skin: dry, intact, normal temperature Psychiatry: normal mood, congruent affect, judgement and insight appear normal   A&P: as per H&P by Dr. Sidney Ace, with any changes or additions as below:  --Stat repeat EKG and troponin (prior trop 10, twice) --Maalox trial --Morphine and SL nitro PRN --Cardiology ordered Myoview stress test for this afternoon --Our office received paperwork that home O2 re-certification is needed, will do this admission.  Will get RN to do home O2 qualification after stress test.    No charge

## 2022-11-10 NOTE — Consult Note (Signed)
   Heart Failure Nurse Navigator Note  HFmrEF 45 to 50%.  Mild LVH.  Grade 1 diastolic dysfunction.  Right ventricular systolic function is mildly reduced.  Moderately elevated pulmonary artery systolic pressures.  Mild mitral regurgitation.  She presented to the emergency room with complaints of chest pain, dyspnea on exertion and ND, orthopnea.  BNP 926.  Chest x-ray revealed CHF.  Comorbidities:  Asthma Coronary artery disease Hypertension GERD Obstructive sleep apnea H/O  pulmonary embolus on NOAC Rheumatoid arthritis  Medications:  Eliquis 2.5 mg 2 times a day Atorvastatin 40 mg daily Folic acid 1 mg daily Furosemide 40 mg IV 2 times a day Isosorbide mononitrate 30 mg daily Entresto 24/26 mg 2 times a day   Labs:  Sodium 137, potassium 3.8, chloride 104, CO2 24, BUN 18, creatinine 0.97, GFR 59.  Hemoglobin 13.5, hematocrit 43.5. Weight 77.6 kg Intake not documented Output not documented  Initial meeting with patient who was lying quietly on her side in the emergency room.  There were no family members present at the bedside.  Patient states that she currently lives with her son.   Discussed  heart failure and how she takes care of herself at home.  Patient states that she had quit weighing herself as her weight had remained steady at 171 pounds.  No further reporting 2 pound weight gain overnight or a total of 5 pounds within the week or changes in symptoms.  She states that she does not follow of fluid restriction and that she does use a little salt on some of her food.  Explained the reasoning behind not using salt and eating low-sodium foods.  Made aware that she has follow-up in the outpatient heart failure clinic on March 18 at 3 PM.  She has an 8% no-show which is 4 out of 53 appointments.  She had no further questions.  Pricilla Riffle RN [facility]

## 2022-11-10 NOTE — Assessment & Plan Note (Signed)
continue Eliquis Currently bradycardic.  Discussed with family at this point would like to concentrate on comfort discontinue telemetry   

## 2022-11-10 NOTE — Progress Notes (Signed)
Pt to stress test

## 2022-11-10 NOTE — Assessment & Plan Note (Addendum)
Resolved.  Presented with chest pain in setting of mild CHF decompensation. Troponin normal x 3.  No acute EKG changes.  Unlikely due to ACS. 3/8 - recurrent severe 10/10 chest pain responded well to Maalox and pt reported it felt like indigestion --Cardiology following --Myoview 3/8 -- abnormal -- see report --stat EKG and troponin if recurrent chest pain --PRN SL nitroglycerin and IV morphine

## 2022-11-10 NOTE — Assessment & Plan Note (Signed)
Stable, not exacerbated. --continue Singulair and PRN albuterol

## 2022-11-11 ENCOUNTER — Inpatient Hospital Stay
Admit: 2022-11-11 | Discharge: 2022-11-11 | Disposition: A | Discharge status: Home / Self Care | Payer: 59 | Attending: Internal Medicine | Admitting: Internal Medicine

## 2022-11-11 LAB — NM MYOCAR MULTI W/SPECT W/WALL MOTION / EF
LV dias vol: 150 mL (ref 46–106)
LV sys vol: 117 mL
Nuc Stress EF: 22 %
Rest Nuclear Isotope Dose: 9.7 mCi
SDS: 2
SRS: 17
SSS: 13
ST Depression (mm): 0 mm
Stress Nuclear Isotope Dose: 31.1 mCi
TID: 1.11

## 2022-11-11 MED ORDER — FUROSEMIDE 40 MG PO TABS
40.0000 mg | ORAL_TABLET | Freq: Every day | ORAL | Status: DC
  Administered 2022-11-12: 40 mg via ORAL
  Filled 2022-11-11: qty 1

## 2022-11-11 MED ORDER — SPIRONOLACTONE 12.5 MG HALF TABLET
12.5000 mg | ORAL_TABLET | Freq: Every day | ORAL | Status: DC
  Administered 2022-11-11 – 2022-11-12 (×2): 12.5 mg via ORAL
  Filled 2022-11-11 (×2): qty 1

## 2022-11-11 NOTE — Progress Notes (Addendum)
Progress Note   Patient: Tina Fields [id number] DOB: [birth year] DOA: 11/10/2022     2 DOS: the patient was seen and examined on 11/12/2022   Brief hospital course: HPI on admission by Dr. Sidney Ace: "Tina Fields is a 77 y.o. female with medical history significant for asthma, systolic and diastolic CHF, GERD, essential hypertension, history of PE on Eliquis, OSA and rheumatoid arthritis, who presented to the emergency room with acute onset of chest pain with associated dyspnea as well as orthopnea with dyspnea on exertion.  She graded her pain as 4/10 in severity and describes it as a sharp pain without radiation however with nausea without vomiting or diaphoresis.  She admits to orthopnea and occasional paroxysmal nocturnal dyspnea as well as dyspnea on exertion without significant cough or wheezing....   "    Evaluation in the ED --- BNP was elevated 925.  Troponin normal at 10 twice.  EGK normal sinus with no acute ischemic changes.  Chest xray with mild vascular congestion and interstitial edema, consistent with mild CHF.  She was admitted to medicine service, started on IV diuresis and cardiology was consulted.  Myoview stress test done 3/8 -- report still pending. Cardiology recommendations are pending, but have ordered an Echo.  3/9 -- pt denies recurrence of chest pain and notes improvement in her pain yesterday after given Maalox.   Assessment and Plan: * Acute on chronic combined CHF Mgmt per Cardiology, Dr. Clayborn Bigness following. Presented with chest pain, mild edema, elevated BNP and chest xray with mild CHF changes. --continue diuresis with IV Lasix 40 mg BID --diuresis per cardiology --strict I/O's and daily weights. - Continue Entresto and Aldactone -- monitor renal function and electrolytes --Echo is ordered and pending Prior echo from May 2023 -- EF 45-50%, with LV WMA's, mild LVH, grade I dd, moderately elevated PASP, mild MR   Chest pain Resolved.   Presented with chest pain in setting of mild CHF decompensation. Troponin normal x 3.  No acute EKG changes.  Unlikely due to ACS. 3/8 - recurrent severe 10/10 chest pain responded well to Maalox and pt reported it felt like indigestion --Cardiology following --Myoview done yesterday, report still pending --stat EKG and troponin if recurrent chest pain --PRN SL nitroglycerin and IV morphine   Essential hypertension --Continue her hypertensives  Dyslipidemia Continue statin   COPD with asthma Stable, not exacerbated. --continue Singulair and PRN albuterol  Rheumatoid arthritis, unspecified (HCC) Seems stable, no active flare --continue Plaquenil and methotrexate --Also on Humira every 3 months  History of pulmonary embolism --continue Eliquis        Subjective: Pt awake resting in bed when seen this AM.  She denies any recurrence of chest pain since yesterday AM when she had severe episode. She reports pain did improve after Maalox yesterday.  No other acute complaints.  States cardiologist recently in to see her, ordered Echo and told her she can maybe go home later.  Physical Exam: Vitals:   11/11/22 1954 11/11/22 2330 11/12/22 0421 11/12/22 0500  BP: (!) 98/45 (!) 100/50 104/65   Pulse: 71 68 70   Resp: '17 18 17   '$ Temp: 98.6 F (37 C) 98.5 F (36.9 C) 98.4 F (36.9 C)   TempSrc:  Oral Oral   SpO2: 97% 97% 97%   Weight:    79.9 kg  Height:       General exam: awake, alert, no acute distress HEENT: atraumatic, clear conjunctiva, anicteric sclera, moist mucus membranes, hearing grossly  normal  Respiratory system: CTAB, no wheezes, rales or rhonchi, normal respiratory effort. Cardiovascular system: normal S1/S2, RRR, trace BLE nonpitting edema.   Gastrointestinal system: soft, NT, ND, no HSM felt, +bowel sounds. Central nervous system: A&O x3. no gross focal neurologic deficits, normal speech Extremities: moves all, trace CLE edema, normal tone Skin: dry, intact,  normal temperature Psychiatry: normal mood, congruent affect, judgement and insight appear normal   Data Reviewed:  Notable labs ---stable renal function Cr 0.97 from 0.92, Ca 8.3.  WBC 3.8 from 3.5   Family Communication: None present, will attempt to call.  Disposition: Status is: Inpatient Remains inpatient appropriate because: Ongoing evaluation and pending cardiology clearance for d/c   Planned Discharge Destination: Home    Time spent: 38 minutes  Author: Ezekiel Slocumb, DO 11/12/2022 8:36 AM  For on call review [email or web address].

## 2022-11-11 NOTE — Progress Notes (Signed)
[facility] Cardiology    SUBJECTIVE: Denies any worsening shortness of breath no further chest pain or angina resting comfortably in bed still has some lower extremity edema   Vitals:   11/10/22 2349 11/11/22 0418 11/11/22 0500 11/11/22 0801  BP: (!) 92/56 (!) 107/51  104/67  Pulse: 83 81  88  Resp: 20 19    Temp: 97.8 F (36.6 C) 98.2 F (36.8 C)    TempSrc: Oral Oral    SpO2: 94% 98%  95%  Weight:   77.6 kg   Height:         Intake/Output Summary (Last 24 hours) at 11/11/2022 0936 Last data filed at 11/11/2022 0431 Gross per 24 hour  Intake 1581.75 ml  Output 300 ml  Net 1281.75 ml      PHYSICAL EXAM  General: Well developed, well nourished, in no acute distress HEENT:  Normocephalic and atramatic Neck:  No JVD.  Lungs: Clear bilaterally to auscultation and percussion. Heart: HRRR . Normal S1 and S2 without gallops or 2/6 sem murmurs.  Abdomen: Bowel sounds are positive, abdomen soft and non-tender  Msk:  Back normal, normal gait. Normal strength and tone for age. Extremities: No clubbing, cyanosis or 2+edema.   Neuro: Alert and oriented X 3. Psych:  Good affect, responds appropriately   LABS: Basic Metabolic Panel: Recent Labs    11/09/22 2352 11/10/22 0644  NA 136 137  K 3.6 3.8  CL 105 104  CO2 22 24  GLUCOSE 114* 95  BUN 17 18  CREATININE 0.92 0.97  CALCIUM 8.5* 8.3*   Liver Function Tests: No results for input(s): "AST", "ALT", "ALKPHOS", "BILITOT", "PROT", "ALBUMIN" in the last 72 hours. Recent Labs    11/09/22 2352  LIPASE 37   CBC: Recent Labs    11/09/22 2352 11/10/22 0644  WBC 3.5* 3.8*  HGB 13.4 13.5  HCT 43.0 43.5  MCV 103.6* 102.8*  PLT 227 216   Cardiac Enzymes: No results for input(s): "CKTOTAL", "CKMB", "CKMBINDEX", "TROPONINI" in the last 72 hours. BNP: Invalid input(s): "POCBNP" D-Dimer: No results for input(s): "DDIMER" in the last 72 hours. Hemoglobin A1C: No results for input(s): "HGBA1C" in the last 72 hours. Fasting  Lipid Panel: No results for input(s): "CHOL", "HDL", "LDLCALC", "TRIG", "CHOLHDL", "LDLDIRECT" in the last 72 hours. Thyroid Function Tests: No results for input(s): "TSH", "T4TOTAL", "T3FREE", "THYROIDAB" in the last 72 hours.  Invalid input(s): "FREET3" Anemia Panel: No results for input(s): "VITAMINB12", "FOLATE", "FERRITIN", "TIBC", "IRON", "RETICCTPCT" in the last 72 hours.  DG Chest 2 View  Result Date: 11/10/2022 CLINICAL DATA:  Chest pain EXAM: CHEST - 2 VIEW COMPARISON:  07/28/2022 FINDINGS: Cardiac shadow is mildly enlarged. Aortic calcifications are again seen. Mild vascular congestion is noted with mild interstitial edema stable from the prior exam. No new focal infiltrate or effusion is noted. No bony abnormality is seen. IMPRESSION: Mild CHF Electronically Signed   By: Inez Catalina M.D.   On: 11/10/2022 00:16     Echo pending  TELEMETRY: Normal sinus rhythm rate of 80 nonspecific ST-T wave changes:  ASSESSMENT AND PLAN:  Principal Problem:   Acute on chronic combined CHF Active Problems:   Rheumatoid arthritis, unspecified (HCC)   COPD with asthma   History of pulmonary embolism   Dyslipidemia   Essential hypertension   Chest pain    Plan Congestive heart failure mild systolic dysfunction continue medical therapy with diuretics beta-blockers currently on Entresto Recommend echocardiogram for reassessment of left ventricular function Continue diuretic therapy  for heart failure elevated BNP Inhalers as necessary for COPD type symptoms Agree with statin therapy for hyperlipidemia Hypertension management and control Entresto beta-blocker and spironolactone Consider antianginals Imdur beta-blockers and aspirin Continue Eliquis history of pulmonary emboli for anticoagulation History of rheumatoid arthritis currently on Plaquenil and methotrexate therapy getting Humira every 90 days   Yolonda Kida, MD 11/11/2022 9:36 AM

## 2022-11-11 NOTE — Hospital Course (Addendum)
HPI on admission by Dr. Sidney Ace: "Tina Fields is a 77 y.o. female with medical history significant for asthma, systolic and diastolic CHF, GERD, essential hypertension, history of PE on Eliquis, OSA and rheumatoid arthritis, who presented to the emergency room with acute onset of chest pain with associated dyspnea as well as orthopnea with dyspnea on exertion.  She graded her pain as 4/10 in severity and describes it as a sharp pain without radiation however with nausea without vomiting or diaphoresis.  She admits to orthopnea and occasional paroxysmal nocturnal dyspnea as well as dyspnea on exertion without significant cough or wheezing....   "    Evaluation in the ED --- BNP was elevated 925.  Troponin normal at 10 twice.  EGK normal sinus with no acute ischemic changes.  Chest xray with mild vascular congestion and interstitial edema, consistent with mild CHF.  She was admitted to medicine service, started on IV diuresis and cardiology was consulted.  Myoview stress test done 3/8 -- Findings: "Abnormal myocardial perfusion scan Severely depressed left ventricular function less than 25% Perfusion images suggest multiple persistent defects apical anterior lateral wall inferior Poor artifactual low uptake  cannot be excluded this may all be related to artifact versus infarct and scar This is a high risk scan"  3/9 -- pt denies recurrence of chest pain and notes improvement in her pain yesterday after given Maalox.   3/10 -- remains stable, on PO Lasix.  Cardiology cleared for d/c and patient is medically stable and agreeable.  She will follow up next week with cardiology.

## 2022-11-11 NOTE — Progress Notes (Signed)
MEWS Progress Note Pt green per MEWs guidelines at this time, pt was yellow at 1613, however no Q2 x1hr obtained at 1713. Will implement MEWs guidelines now. VS Q4 x 12hr  if pt remains green. Pt stable, resting in bed w/ eyes closed. See further details below   Patient Details Name: Tina Fields MRN: [id number] DOB: 07-May-1946 Today's Date: 11/11/2022   MEWS Flowsheet Documentation:  Assess: MEWS Score Temp: 98.6 F (37 C) BP: (!) 98/45 MAP (mmHg): (!) 60 Pulse Rate: 71 ECG Heart Rate: 93 Resp: 17 Level of Consciousness: Alert SpO2: 97 % O2 Device: Nasal Cannula Patient Activity (if Appropriate): In bed O2 Flow Rate (L/min): 2 L/min Assess: MEWS Score MEWS Temp: 0 MEWS Systolic: 1 MEWS Pulse: 0 MEWS RR: 0 MEWS LOC: 0 MEWS Score: 1 MEWS Score Color: Green Assess: SIRS CRITERIA SIRS Temperature : 0 SIRS Respirations : 0 SIRS Pulse: 0 SIRS WBC: 0 SIRS Score Sum : 0 SIRS Temperature : 0 SIRS Pulse: 0 SIRS Respirations : 0 SIRS WBC: 0 SIRS Score Sum : 0 Assess: if the MEWS score is Yellow or Red Were vital signs taken at a resting state?: Yes Focused Assessment: No change from prior assessment Does the patient meet 2 or more of the SIRS criteria?: No MEWS guidelines implemented : No, previously yellow, continue vital signs every 4 hours Notify: Charge Nurse/RN Name of Charge Nurse/RN Notified: Tiffany, RN (pt yellow for previous shift, MEWS not implemented, will implement now)      Sharlett Iles 11/11/2022, 8:54 PM

## 2022-11-11 NOTE — Progress Notes (Signed)
Echocardiogram 2D Echocardiogram has been performed.  Tina Fields 11/11/2022, 5:34 PM

## 2022-11-12 ENCOUNTER — Encounter: Payer: Self-pay | Admitting: Family Medicine

## 2022-11-12 LAB — BASIC METABOLIC PANEL
Anion gap: 5 (ref 5–15)
BUN: 24 mg/dL — ABNORMAL HIGH (ref 8–23)
CO2: 26 mmol/L (ref 22–32)
Calcium: 8.3 mg/dL — ABNORMAL LOW (ref 8.9–10.3)
Chloride: 106 mmol/L (ref 98–111)
Creatinine, Ser: 0.94 mg/dL (ref 0.44–1.00)
GFR, Estimated: >60 mL/min (ref >60)
Glucose, Bld: 93 mg/dL (ref 70–99)
Potassium: 3.5 mmol/L (ref 3.5–5.1)
Sodium: 137 mmol/L (ref 135–145)

## 2022-11-12 LAB — ECHOCARDIOGRAM COMPLETE
AR max vel: 1.52 cm2
AV Peak grad: 7.4 mmHg
Ao pk vel: 1.36 m/s
Area-P 1/2: 2.2 cm2
Calc EF: 37.5 %
S' Lateral: 4.6 cm
Single Plane A2C EF: 36.4 %
Single Plane A4C EF: 41.1 %
Weight: 2737.23 oz

## 2022-11-12 LAB — MAGNESIUM: Magnesium: 1.8 mg/dL (ref 1.7–2.4)

## 2022-11-12 MED ORDER — SPIRONOLACTONE 25 MG PO TABS: 12.5000 mg | ORAL_TABLET | Freq: Every day | ORAL | 1 refills | Status: DC

## 2022-11-12 MED ORDER — CARVEDILOL 3.125 MG PO TABS: 3.1250 mg | ORAL_TABLET | Freq: Two times a day (BID) | ORAL | 1 refills | Status: DC

## 2022-11-12 MED ORDER — MIDODRINE HCL 5 MG PO TABS: 5.0000 mg | ORAL_TABLET | Freq: Three times a day (TID) | ORAL | 1 refills | Status: DC

## 2022-11-12 MED ORDER — FUROSEMIDE 20 MG PO TABS: 20.0000 mg | ORAL_TABLET | Freq: Every day | ORAL | Status: DC

## 2022-11-12 MED ORDER — CARVEDILOL 3.125 MG PO TABS: 3.1250 mg | ORAL_TABLET | Freq: Two times a day (BID) | ORAL | Status: DC

## 2022-11-12 MED ORDER — NITROGLYCERIN 0.4 MG SL SUBL: 0.4000 mg | SUBLINGUAL_TABLET | SUBLINGUAL | 0 refills | Status: AC | PRN

## 2022-11-12 NOTE — Progress Notes (Signed)
[facility] Cardiology    SUBJECTIVE: Patient states to be doing reasonably well denies any further chest pain shortness of breath is improved he is ambulated in the halls well with oxygen occasionally and feels well enough to go home   Vitals:   11/12/22 0500 11/12/22 0820 11/12/22 1000 11/12/22 1128  BP:  117/67  101/88  Pulse:  70  86  Resp:  20  18  Temp:   98.4 F (36.9 C) 98.2 F (36.8 C)  TempSrc:      SpO2:  99%  97%  Weight: 79.9 kg     Height:         Intake/Output Summary (Last 24 hours) at 11/12/2022 1251 Last data filed at 11/12/2022 0900 Gross per 24 hour  Intake 720 ml  Output 300 ml  Net 420 ml      PHYSICAL EXAM  General: Well developed, well nourished, in no acute distress HEENT:  Normocephalic and atramatic Neck:  No JVD.  Lungs: Clear bilaterally to auscultation and percussion. Heart: HRRR . Normal S1 and S2 without gallops or murmurs.  Abdomen: Bowel sounds are positive, abdomen soft and non-tender  Msk:  Back normal, normal gait. Normal strength and tone for age. Extremities: No clubbing, cyanosis or edema.  Deformities related to arthritis Neuro: Alert and oriented X 3. Psych:  Good affect, responds appropriately   LABS: Basic Metabolic Panel: Recent Labs    11/10/22 0644 11/12/22 0559  NA 137 137  K 3.8 3.5  CL 104 106  CO2 24 26  GLUCOSE 95 93  BUN 18 24*  CREATININE 0.97 0.94  CALCIUM 8.3* 8.3*  MG  --  1.8   Liver Function Tests: No results for input(s): "AST", "ALT", "ALKPHOS", "BILITOT", "PROT", "ALBUMIN" in the last 72 hours. Recent Labs    11/09/22 2352  LIPASE 37   CBC: Recent Labs    11/09/22 2352 11/10/22 0644  WBC 3.5* 3.8*  HGB 13.4 13.5  HCT 43.0 43.5  MCV 103.6* 102.8*  PLT 227 216   Cardiac Enzymes: No results for input(s): "CKTOTAL", "CKMB", "CKMBINDEX", "TROPONINI" in the last 72 hours. BNP: Invalid input(s): "POCBNP" D-Dimer: No results for input(s): "DDIMER" in the last 72 hours. Hemoglobin A1C: No  results for input(s): "HGBA1C" in the last 72 hours. Fasting Lipid Panel: No results for input(s): "CHOL", "HDL", "LDLCALC", "TRIG", "CHOLHDL", "LDLDIRECT" in the last 72 hours. Thyroid Function Tests: No results for input(s): "TSH", "T4TOTAL", "T3FREE", "THYROIDAB" in the last 72 hours.  Invalid input(s): "FREET3" Anemia Panel: No results for input(s): "VITAMINB12", "FOLATE", "FERRITIN", "TIBC", "IRON", "RETICCTPCT" in the last 72 hours.  NM Myocar Multi W/Spect W/Wall Motion / EF  Result Date: 11/11/2022   Findings are consistent with infarction. The study is high risk.   No ST deviation was noted.   LV perfusion is abnormal. There is no evidence of ischemia. There is evidence of infarction. Defect 1: There is a large defect with severe reduction in uptake present in the apical anterolateral and inferolateral location(s) that is fixed. There is abnormal wall motion in the defect area. Consistent with infarction and artifact.   Left ventricular function is abnormal. Nuclear stress EF: 22 %. The left ventricular ejection fraction is severely decreased (<30%). End diastolic cavity size is mildly enlarged. End systolic cavity size is mildly enlarged. Abnormal myocardial perfusion scan Severely depressed left ventricular function less than 25% Perfusion images suggest multiple persistent defects apical anterior lateral wall inferior Poor artifactual low uptake  cannot be excluded  this may all be related to artifact versus infarct and scar This is a high risk scan We will consider alternative imaging study for corroboration     Echo severely depressed left ventricular function EF less than 25% globally depressed.  Apparently speckled myocardium which may be consistent with infiltrative process no significant valvular abnormalities  TELEMETRY: Normal sinus rhythm with of 78 nonspecific ST-T wave changes:  ASSESSMENT AND PLAN:  Principal Problem:   Acute on chronic combined CHF Active Problems:    Rheumatoid arthritis, unspecified (HCC)   COPD with asthma   History of pulmonary embolism   Dyslipidemia   Essential hypertension   Chest pain Hypoxemia Angina Systolic cardiomyopathy  Plan Shortness of breath congestive heart failure appears to be systolic with depressed left ventricular function recommend continue Entresto and Coreg continue spironolactone maintain Lasix therapy Consider [place] as part of her regimen for systolic congestive heart failure Cardiomyopathy systolic unclear etiology may very well be ischemic but evaluation for nonischemic infiltrative process should be considered Hypoxemia COPD continue oxygen therapy as necessary Mild hypotension instituted midodrine therapy to help Continue Imdur 30 mg a day we will consider adding hydralazine if additional blood pressure therapy is necessary and may also help with heart failure Continue statin therapy for hyperlipidemia management Patient denies any further chest pain or angina but will maintain Imdur and add Coreg patient is on Eliquis so will defer aspirin therapy Continue Eliquis therapy history DVT  in the past Lower extremity edema improved continue Lasix therapy consider support stockings elevation Patient ambulated well today in the halls so may be strong enough for early discharge with early follow-up with cardiology Will have the patient arranged to see cardiology this week for further recommendations and management for imaging versus cardiac cath  Cardiac meds discharge Coreg 3.125 twice a day Entresto 24/26 mg twice a day Eliquis 2.5 twice a day Spironolactone 12.5 daily Lasix 20 mg daily Imdur 30 mg daily Lipitor 40 mg daily Midodrine 5 mg 3 times a day as needed for hypotension  Follow-up with cardiology this week    Yolonda Kida, MD 11/12/2022 12:51 PM

## 2022-11-12 NOTE — Discharge Summary (Addendum)
Physician Discharge Summary   Patient: Tina Fields MRN: [id number] DOB: 22-Dec-1945  Admit date:     11/10/2022  Discharge date: 11/12/22  Discharge Physician: Ezekiel Slocumb   PCP: Letta Median, MD   Recommendations at discharge:   Follow up with Cardiology next week Follow up with Primary Care in 1-2 weeks Follow blood pressure on multiple cardiac meds and midodrine Repeat labs in 1-2 weeks  Discharge Diagnoses: Principal Problem:   Acute on chronic combined CHF Active Problems:   Essential hypertension   COPD with asthma   Dyslipidemia   Rheumatoid arthritis, unspecified ([place])   History of pulmonary embolism  Resolved Problems:   Chest pain  Hospital Course: HPI on admission by Dr. Sidney Ace: "Tina Fields is a 77 y.o. female with medical history significant for asthma, systolic and diastolic CHF, GERD, essential hypertension, history of PE on Eliquis, OSA and rheumatoid arthritis, who presented to the emergency room with acute onset of chest pain with associated dyspnea as well as orthopnea with dyspnea on exertion.  She graded her pain as 4/10 in severity and describes it as a sharp pain without radiation however with nausea without vomiting or diaphoresis.  She admits to orthopnea and occasional paroxysmal nocturnal dyspnea as well as dyspnea on exertion without significant cough or wheezing....   "    Evaluation in the ED --- BNP was elevated 925.  Troponin normal at 10 twice.  EGK normal sinus with no acute ischemic changes.  Chest xray with mild vascular congestion and interstitial edema, consistent with mild CHF.  She was admitted to medicine service, started on IV diuresis and cardiology was consulted.  Myoview stress test done 3/8 -- Findings: "Abnormal myocardial perfusion scan Severely depressed left ventricular function less than 25% Perfusion images suggest multiple persistent defects apical anterior lateral wall inferior Poor artifactual  low uptake  cannot be excluded this may all be related to artifact versus infarct and scar This is a high risk scan"  3/9 -- pt denies recurrence of chest pain and notes improvement in her pain yesterday after given Maalox.   3/10 -- remains stable, on PO Lasix.  Cardiology cleared for d/c and patient is medically stable and agreeable.  She will follow up next week with cardiology.  Assessment and Plan: * Acute on chronic combined CHF Mgmt per Cardiology, Dr. Clayborn Bigness following. Presented with chest pain, mild edema, elevated BNP and chest xray with mild CHF changes. --Diuresed with IV Lasix 40 mg BID --Resume on home PO lasix 20 mg daily --Daily weights at home - Continue Entresto and Aldactone --Started on low dose Coreg --Started on midodrine for soft BP's, mildly hypotensive at times but stable MAP's -- monitor renal function and electrolytes --Echo 3/9 -- EF 20-25%, consider infiltrative process, global LV hypokinesis, grade I dd, mild MR --Prior echo from May 2023 -- EF 45-50%, with LV WMA's, mild LVH, grade I dd, moderately elevated PASP, mild MR --Plan is for cardiac MR vs CT vs cath to rule out ischemic cardiomyopathy   Chest pain-resolved as of 11/12/2022 Resolved.  Presented with chest pain in setting of mild CHF decompensation. Troponin normal x 3.  No acute EKG changes.  Unlikely due to ACS. 3/8 - recurrent severe 10/10 chest pain responded well to Maalox and pt reported it felt like indigestion --Cardiology following --Myoview 3/8 -- abnormal -- see report --stat EKG and troponin if recurrent chest pain --PRN SL nitroglycerin and IV morphine   Essential hypertension --Continue  her hypertensives  Dyslipidemia Continue statin   COPD with asthma Stable, not exacerbated. --continue Singulair and PRN albuterol  Rheumatoid arthritis, unspecified (HCC) Seems stable, no active flare --continue home Plaquenil, prednisone and methotrexate --Previously on Humira, appears  stopped due to recurrent sinus infections  History of pulmonary embolism --continue Eliquis         Consultants: Cardiology Procedures performed: Myoview stress test, Echo    Disposition: Home  Diet recommendation:  Cardiac diet   DISCHARGE MEDICATION: Allergies as of 11/12/2022       Reactions   Ace Inhibitors Cough   Other reaction(s): Cough   Hydrochlorothiazide Other (See Comments)   Cramps   Latex Hives   Azithromycin Rash        Medication List     STOP taking these medications    Humira (2 Pen) 40 MG/0.4ML Pnkt Generic drug: Adalimumab       TAKE these medications    acetaminophen 650 MG CR tablet Commonly known as: TYLENOL Take 1,300 mg by mouth as needed for pain.   albuterol (2.5 MG/3ML) 0.083% nebulizer solution Commonly known as: PROVENTIL Take 2.5 mg by nebulization every 6 (six) hours as needed for wheezing or shortness of breath.   albuterol 108 (90 Base) MCG/ACT inhaler Commonly known as: VENTOLIN HFA Inhale 1-2 puffs into the lungs every 6 (six) hours as needed for wheezing or shortness of breath.   alendronate 70 MG tablet Commonly known as: FOSAMAX Take 70 mg by mouth once a week.   apixaban 2.5 MG Tabs tablet Commonly known as: Eliquis Take 1 tablet (2.5 mg total) by mouth 2 (two) times daily.   atorvastatin 40 MG tablet Commonly known as: LIPITOR Take 1 tablet (40 mg total) by mouth every morning.   carvedilol 3.125 MG tablet Commonly known as: COREG Take 1 tablet (3.125 mg total) by mouth 2 (two) times daily with a meal.   [employer] Vitamin B12 1000 MCG Tbcr Generic drug: Cyanocobalamin Take 1,000 mcg by mouth daily.   Entresto 24-26 MG Generic drug: sacubitril-valsartan Take 1 tablet by mouth 2 (two) times daily.   ergocalciferol 1.25 MG (50000 UT) capsule Commonly known as: VITAMIN D2 Take 50,000 Units by mouth once a week. Every monday   fluticasone 50 MCG/ACT nasal spray Commonly known as: FLONASE Place 2  sprays into the nose daily as needed for allergies.   folic acid 1 MG tablet Commonly known as: FOLVITE Take 1 mg by mouth daily.   furosemide 20 MG tablet Commonly known as: LASIX Take 1 tablet (20 mg total) by mouth daily.   hydroxychloroquine 200 MG tablet Commonly known as: PLAQUENIL Take 200 mg by mouth daily.   isosorbide mononitrate 30 MG 24 hr tablet Commonly known as: IMDUR Take 30 mg by mouth daily.   meclizine 25 MG tablet Commonly known as: ANTIVERT Take 25 mg by mouth 3 (three) times daily as needed.   methotrexate 2.5 MG tablet Commonly known as: RHEUMATREX Take 15 mg by mouth once a week.   midodrine 5 MG tablet Commonly known as: PROAMATINE Take 1 tablet (5 mg total) by mouth 3 (three) times daily with meals.   MiraLax 17 GM/SCOOP powder Generic drug: polyethylene glycol powder Take 17 g by mouth daily as needed for moderate constipation. Dissolve 17 gm in 8 ounces of water and drink   montelukast 10 MG tablet Commonly known as: SINGULAIR Take 10 mg by mouth at bedtime.   nitroGLYCERIN 0.4 MG SL tablet Commonly known as:  NITROSTAT Place 1 tablet (0.4 mg total) under the tongue every 5 (five) minutes as needed for chest pain.   predniSONE 5 MG tablet Commonly known as: DELTASONE Take 5 mg by mouth daily.   spironolactone 25 MG tablet Commonly known as: ALDACTONE Take 0.5 tablets (12.5 mg total) by mouth daily. Start taking on: November 13, 2022        Discharge Exam: Danley Danker Weights   11/10/22 1726 11/11/22 0500 11/12/22 0500  Weight: 78.3 kg 77.6 kg 79.9 kg   General exam: awake, alert, no acute distress HEENT: moist mucus membranes, hearing grossly normal  Respiratory system: CTAB, no wheezes, rales or rhonchi, normal respiratory effort. Cardiovascular system: normal S1/S2, RRR, no pedal edema.   Gastrointestinal system: soft, NT, ND, no HSM felt, +bowel sounds. Central nervous system: A&O x3. no gross focal neurologic deficits, normal  speech Extremities: moves all, no edema, normal tone Skin: dry, intact, normal temperature Psychiatry: normal mood, congruent affect, judgement and insight appear normal   Condition at discharge: stable  The results of significant diagnostics from this hospitalization (including imaging, microbiology, ancillary and laboratory) are listed below for reference.   Imaging Studies: ECHOCARDIOGRAM COMPLETE  Result Date: 11/12/2022    ECHOCARDIOGRAM REPORT   Patient Name:   Tina Fields Date of Exam: 11/11/2022 Medical Rec #:  [id number]             Height:       66.0 in Accession #:    [id number]            Weight:       171.1 lb Date of Birth:  13-Mar-1946              BSA:          1.871 m Patient Age:    77 years              BP:           94/52 mmHg Patient Gender: F                     HR:           88 bpm. Exam Location:  [facility] Procedure: 2D Echo Indications:     CHF I50.9  History:         Patient has prior history of Echocardiogram examinations, most                  recent 01/29/2022.  Sonographer:     Kathlen Brunswick RDCS Referring Phys:  [phone] DWAYNE D CALLWOOD Diagnosing Phys: Yolonda Kida MD IMPRESSIONS  1. Consider infiltrative process ie.Marland Kitchen amyloid /sarcoid.  2. Ischemic cardiomyopathy can't be excluded.  3. Severely depressed LVF worse since previous study 5/23.  4. Left ventricular ejection fraction, by estimation, is 20 to 25%. The left ventricle has severely decreased function. The left ventricle demonstrates global hypokinesis. The left ventricular internal cavity size was mildly to moderately dilated. Left ventricular diastolic parameters are consistent with Grade I diastolic dysfunction (impaired relaxation).  5. Right ventricular systolic function is mildly reduced. The right ventricular size is mildly enlarged. Mildly increased right ventricular wall thickness.  6. Left atrial size was mildly dilated.  7. Right atrial size was mildly dilated.  8. The mitral valve is  degenerative. Mild mitral valve regurgitation.  9. The aortic valve is calcified. Aortic valve regurgitation is trivial. Aortic valve sclerosis/calcification is present, without any evidence of aortic stenosis. FINDINGS  Left Ventricle:  Left ventricular ejection fraction, by estimation, is 20 to 25%. The left ventricle has severely decreased function. The left ventricle demonstrates global hypokinesis. The left ventricular internal cavity size was mildly to moderately dilated. There is borderline concentric left ventricular hypertrophy. Left ventricular diastolic parameters are consistent with Grade I diastolic dysfunction (impaired relaxation). Right Ventricle: The right ventricular size is mildly enlarged. Mildly increased right ventricular wall thickness. Right ventricular systolic function is mildly reduced. Left Atrium: Left atrial size was mildly dilated. Right Atrium: Right atrial size was mildly dilated. Pericardium: There is no evidence of pericardial effusion. Mitral Valve: The mitral valve is degenerative in appearance. There is moderate thickening of the mitral valve leaflet(s). There is mild calcification of the mitral valve leaflet(s). Mild mitral valve regurgitation. Tricuspid Valve: The tricuspid valve is normal in structure. Tricuspid valve regurgitation is mild. Aortic Valve: The aortic valve is calcified. Aortic valve regurgitation is trivial. Aortic valve sclerosis/calcification is present, without any evidence of aortic stenosis. Aortic valve peak gradient measures 7.4 mmHg. Pulmonic Valve: The pulmonic valve was grossly normal. Pulmonic valve regurgitation is trivial. Aorta: The ascending aorta was not well visualized. IAS/Shunts: No atrial level shunt detected by color flow Doppler. Additional Comments: Severely depressed LVF worse since previous study 5/23. Consider infiltrative process ie.Marland Kitchen amyloid /sarcoid. Ischemic cardiomyopathy can't be excluded.  LEFT VENTRICLE PLAX 2D LVIDd:          5.20 cm      Diastology LVIDs:         4.60 cm      LV e' medial:    6.31 cm/s LV PW:         1.30 cm      LV E/e' medial:  8.6 LV IVS:        1.00 cm      LV e' lateral:   5.22 cm/s LVOT diam:     1.90 cm      LV E/e' lateral: 10.4 LV SV:         38 LV SV Index:   20 LVOT Area:     2.84 cm  LV Volumes (MOD) LV vol d, MOD A2C: 97.6 ml LV vol d, MOD A4C: 175.0 ml LV vol s, MOD A2C: 62.1 ml LV vol s, MOD A4C: 103.0 ml LV SV MOD A2C:     35.5 ml LV SV MOD A4C:     175.0 ml LV SV MOD BP:      49.3 ml RIGHT VENTRICLE RV Basal diam:  2.60 cm RV S prime:     8.92 cm/s TAPSE (M-mode): 1.7 cm LEFT ATRIUM             Index        RIGHT ATRIUM          Index LA diam:        4.10 cm 2.19 cm/m   RA Area:     9.89 cm LA Vol (A2C):   74.7 ml 39.92 ml/m  RA Volume:   18.00 ml 9.62 ml/m LA Vol (A4C):   47.1 ml 25.17 ml/m LA Biplane Vol: 60.8 ml 32.49 ml/m  AORTIC VALVE                 PULMONIC VALVE AV Area (Vmax): 1.52 cm     PV Vmax:          0.87 m/s AV Vmax:        136.00 cm/s  PV Peak grad:     3.0 mmHg  AV Peak Grad:   7.4 mmHg     PR End Diast Vel: 5.62 msec LVOT Vmax:      72.70 cm/s LVOT Vmean:     41.900 cm/s LVOT VTI:       0.133 m  AORTA                        PULMONARY ARTERY Ao Root diam: 3.10 cm        MPA diam:        1.90 cm Ao Asc diam:  2.70 cm MITRAL VALVE               TRICUSPID VALVE MV Area (PHT): 2.20 cm    TV Peak grad:   17.3 mmHg MV Decel Time: 345 msec    TV Vmax:        2.08 m/s MV E velocity: 54.50 cm/s MV A velocity: 72.30 cm/s  SHUNTS MV E/A ratio:  0.75        Systemic VTI:  0.13 m                            Systemic Diam: 1.90 cm Yolonda Kida MD Electronically signed by Yolonda Kida MD Signature Date/Time: 11/12/2022/1:12:02 PM    Final    NM Myocar Multi W/Spect Tina Fields Motion / EF  Result Date: 11/11/2022   Findings are consistent with infarction. The study is high risk.   No ST deviation was noted.   LV perfusion is abnormal. There is no evidence of ischemia. There is evidence  of infarction. Defect 1: There is a large defect with severe reduction in uptake present in the apical anterolateral and inferolateral location(s) that is fixed. There is abnormal wall motion in the defect area. Consistent with infarction and artifact.   Left ventricular function is abnormal. Nuclear stress EF: 22 %. The left ventricular ejection fraction is severely decreased (<30%). End diastolic cavity size is mildly enlarged. End systolic cavity size is mildly enlarged. Abnormal myocardial perfusion scan Severely depressed left ventricular function less than 25% Perfusion images suggest multiple persistent defects apical anterior lateral wall inferior Poor artifactual low uptake  cannot be excluded this may all be related to artifact versus infarct and scar This is a high risk scan We will consider alternative imaging study for corroboration   DG Chest 2 View  Result Date: 11/10/2022 CLINICAL DATA:  Chest pain EXAM: CHEST - 2 VIEW COMPARISON:  07/28/2022 FINDINGS: Cardiac shadow is mildly enlarged. Aortic calcifications are again seen. Mild vascular congestion is noted with mild interstitial edema stable from the prior exam. No new focal infiltrate or effusion is noted. No bony abnormality is seen. IMPRESSION: Mild CHF Electronically Signed   By: Inez Catalina M.D.   On: 11/10/2022 00:16    Microbiology: Results for orders placed or performed during the hospital encounter of 07/28/22  Resp Panel by RT-PCR (Flu A&B, Covid) Anterior Nasal Swab     Status: Abnormal   Collection Time: 07/28/22  5:33 PM   Specimen: Anterior Nasal Swab  Result Value Ref Range Status   SARS Coronavirus 2 by RT PCR POSITIVE (A) NEGATIVE Final    Comment: (NOTE) SARS-CoV-2 target nucleic acids are DETECTED.  The SARS-CoV-2 RNA is generally detectable in upper respiratory specimens during the acute phase of infection. Positive results are indicative of the presence of the identified virus, but do not rule out bacterial  infection or co-infection with other pathogens not detected by the test. Clinical correlation with patient history and other diagnostic information is necessary to determine patient infection status. The expected result is Negative.  Fact Sheet for Patients: EntrepreneurPulse.com.au  Fact Sheet for Healthcare Providers: IncredibleEmployment.be  This test is not yet approved or cleared by the [place] FDA and  has been authorized for detection and/or diagnosis of SARS-CoV-2 by FDA under an Emergency Use Authorization (EUA).  This EUA will remain in effect (meaning this test can be used) for the duration of  the COVID-19 declaration under Section 564(b)(1) of the A ct, 21 U.S.C. section 360bbb-3(b)(1), unless the authorization is terminated or revoked sooner.     Influenza A by PCR NEGATIVE NEGATIVE Final   Influenza B by PCR NEGATIVE NEGATIVE Final    Comment: (NOTE) The Xpert Xpress SARS-CoV-2/FLU/RSV plus assay is intended as an aid in the diagnosis of influenza from Nasopharyngeal swab specimens and should not be used as a sole basis for treatment. Nasal washings and aspirates are unacceptable for Xpert Xpress SARS-CoV-2/FLU/RSV testing.  Fact Sheet for Patients: EntrepreneurPulse.com.au  Fact Sheet for Healthcare Providers: IncredibleEmployment.be  This test is not yet approved or cleared by the [place] FDA and has been authorized for detection and/or diagnosis of SARS-CoV-2 by FDA under an Emergency Use Authorization (EUA). This EUA will remain in effect (meaning this test can be used) for the duration of the COVID-19 declaration under Section 564(b)(1) of the Act, 21 U.S.C. section 360bbb-3(b)(1), unless the authorization is terminated or revoked.  Performed at [facility], [address] [place], [place] [address]   Urine Culture     Status: Abnormal   Collection Time:  07/28/22  5:33 PM   Specimen: Urine, Clean Catch  Result Value Ref Range Status   Specimen Description   Final    URINE, CLEAN CATCH Performed at [facility], [address], [place], [place] [address]    Special Requests   Final    NONE Performed at [facility], [address] [place], [place] [address]    Culture (A)  Final    40,000 COLONIES/mL MULTIPLE SPECIES PRESENT, SUGGEST RECOLLECTION   Report Status 07/30/2022 FINAL  Final    Labs: CBC: Recent Labs  Lab 11/09/22 2352 11/10/22 0644  WBC 3.5* 3.8*  HGB 13.4 13.5  HCT 43.0 43.5  MCV 103.6* 102.8*  PLT 227 [id number]   Basic Metabolic Panel: Recent Labs  Lab 11/09/22 2352 11/10/22 0644 11/12/22 0559  NA 136 137 137  K 3.6 3.8 3.5  CL 105 104 106  CO2 '22 24 26  '$ GLUCOSE 114* 95 93  BUN 17 18 24*  CREATININE 0.92 0.97 0.94  CALCIUM 8.5* 8.3* 8.3*  MG  --   --  1.8   Liver Function Tests: No results for input(s): "AST", "ALT", "ALKPHOS", "BILITOT", "PROT", "ALBUMIN" in the last 168 hours. CBG: No results for input(s): "GLUCAP" in the last 168 hours.  Discharge time spent: greater than 30 minutes.  Signed: Ezekiel Slocumb, DO [facility] 11/12/2022

## 2022-11-19 NOTE — Progress Notes (Deleted)
Patient ID: Tina Fields, female    DOB: 07/20/1946, 77 y.o.   MRN: [id number]  HPI  Tina Fields is a 77 y/o female with a history of asthma, CAD, HTN, aortic atherosclerosis, asthma, GERD, obstructive sleep apnea, RA, previous tobacco use and chronic heart failure.   Echo 11/11/22: EF 20-25% with mild LAE, mild MR. Echo 01/29/22: EF of 45-50% along with mild LVH, moderately elevated PA pressure and mild MR. Echo 05/03/21: EF of 45-50% with moderate LAE.   Admitted 11/10/22 due to chest pain and SOB due to a/c heart failure. IV diuresed. Myoview stress test done 3/8 -- Findings: Abnormal myocardial perfusion scan, Severely depressed left ventricular function less than 25%. Midodrine started   She presents today for a HF follow-up visit with a chief complaint of     Past Medical History:  Diagnosis Date   Aortic atherosclerosis ([place])    Asthma    Chest pain 11/10/2022   CHF (congestive heart failure) (HCC)    Coronary artery disease    Dyspnea    Edema of both legs    GERD (gastroesophageal reflux disease)    HFrEF (heart failure with reduced ejection fraction) ([place])    History of 2019 novel coronavirus disease (COVID-19) 10/28/2020   Hypertension    Myocardial infarction (HCC)    OSA (obstructive sleep apnea)    noncompliant with nocturnal PAP therapy   RA (rheumatoid arthritis) ([place])    Past Surgical History:  Procedure Laterality Date   BREAST SURGERY     CARDIAC CATHETERIZATION Left 06/21/2005   Moderate ostial LCx and LAD disease; LVEF 45%; Location: [facility]; Surgeon: Neoma Laming, MD   CARDIAC CATHETERIZATION Left 05/20/2008   No occlusive CAD; LVEF 65%; Location: [facility]; Surgeon: Katrine Coho, MD   COLONOSCOPY     ESOPHAGOGASTRODUODENOSCOPY     TUBAL LIGATION     Family History  Problem Relation Age of Onset   CAD Mother    Diabetes Mother    Hypertension Mother    CAD Father    Diabetes Father    Hypertension Father    Stroke Father    Social History    Tobacco Use   Smoking status: Former    Types: Cigarettes    Quit date: 01/09/1995    Years since quitting: 27.8   Smokeless tobacco: Never  Substance Use Topics   Alcohol use: No   Allergies  Allergen Reactions   Ace Inhibitors Cough    Other reaction(s): Cough    Hydrochlorothiazide Other (See Comments)    Cramps   Latex Hives   Azithromycin Rash    Review of Systems  Constitutional:  Positive for fatigue. Negative for appetite change.  HENT:  Negative for congestion, postnasal drip and sore throat.   Eyes: Negative.   Respiratory:  Positive for cough and shortness of breath. Negative for chest tightness.   Cardiovascular:  Positive for leg swelling. Negative for chest pain and palpitations.  Gastrointestinal:  Negative for abdominal distention and abdominal pain.  Endocrine: Negative.   Genitourinary: Negative.   Musculoskeletal:  Positive for arthralgias (hands/ knees due to RA) and neck pain. Negative for back pain.  Skin: Negative.   Allergic/Immunologic: Negative.   Neurological:  Negative for dizziness and light-headedness.  Hematological:  Negative for adenopathy. Does not bruise/bleed easily.  Psychiatric/Behavioral:  Negative for dysphoric mood and sleep disturbance (sleeping on 1 pillow). The patient is not nervous/anxious.      Physical Exam Vitals and nursing note reviewed.  Constitutional:      Appearance: Normal appearance.  HENT:     Head: Normocephalic and atraumatic.  Cardiovascular:     Rate and Rhythm: Normal rate and regular rhythm.  Pulmonary:     Effort: Pulmonary effort is normal. No respiratory distress.     Breath sounds: No wheezing or rales.  Abdominal:     General: There is no distension.     Palpations: Abdomen is soft.  Musculoskeletal:        General: Deformity (knuckles on hands due to RA) present. No tenderness.     Cervical back: Normal range of motion and neck supple.     Right lower leg: No edema.     Left lower leg: No  edema.  Skin:    General: Skin is warm and dry.  Neurological:     General: No focal deficit present.     Mental Status: She is alert and oriented to person, place, and time.  Psychiatric:        Mood and Affect: Mood normal.        Behavior: Behavior normal.        Thought Content: Thought content normal.   Assessment & Plan:  1: Chronic heart failure with now reduced ejection fraction- - NYHA class III - euvolemic today - weighing daily; reminded to call for an overnight weight gain of > 2 pounds or a weekly weight gain of > 5 pounds - not adding salt and does read food labels for sodium content - entresto  - reports low BP in the past so may not be able to tolerate other GDMT - BNP 11/09/22 was 926.9  2: HTN- - BP  - sees PCP (Tina Fields @ Tina Fields)  - BMP 11/12/22 reviewed and showed sodium 137, potassium 3.5, creatinine 0.94 & GFR >60  3: Obstructive sleep apnea- - does not wear CPAP  4: RA- - saw rheumatologist Tina Fields) 11/16/22 -   5: PE- - saw cardiology (Tina Fields) 11/14/22 - saw hematology Tina Fields) 08/08/22 - wearing oxygen at 2L around the clock  - currently has home health nursing coming in once / week

## 2022-11-20 ENCOUNTER — Encounter: Payer: 59 | Admitting: Family

## 2022-11-26 ENCOUNTER — Inpatient Hospital Stay
Admission: EM | Admit: 2022-11-26 | Discharge: 2022-11-28 | DRG: 291 | Disposition: A | Discharge status: Home / Self Care | Payer: 59 | Attending: Internal Medicine | Admitting: Internal Medicine

## 2022-11-26 ENCOUNTER — Other Ambulatory Visit: Payer: Self-pay

## 2022-11-26 ENCOUNTER — Emergency Department: Payer: 59

## 2022-11-26 DIAGNOSIS — E785 Hyperlipidemia, unspecified: Secondary | ICD-10-CM | POA: Diagnosis present

## 2022-11-26 DIAGNOSIS — I110 Hypertensive heart disease with heart failure: Secondary | ICD-10-CM | POA: Diagnosis not present

## 2022-11-26 DIAGNOSIS — I2510 Atherosclerotic heart disease of native coronary artery without angina pectoris: Secondary | ICD-10-CM | POA: Diagnosis present

## 2022-11-26 DIAGNOSIS — Z87891 Personal history of nicotine dependence: Secondary | ICD-10-CM

## 2022-11-26 DIAGNOSIS — Z66 Do not resuscitate: Secondary | ICD-10-CM | POA: Diagnosis present

## 2022-11-26 DIAGNOSIS — M069 Rheumatoid arthritis, unspecified: Secondary | ICD-10-CM | POA: Diagnosis present

## 2022-11-26 DIAGNOSIS — I493 Ventricular premature depolarization: Secondary | ICD-10-CM | POA: Diagnosis present

## 2022-11-26 DIAGNOSIS — Z91199 Patient's noncompliance with other medical treatment and regimen due to unspecified reason: Secondary | ICD-10-CM

## 2022-11-26 DIAGNOSIS — J810 Acute pulmonary edema: Secondary | ICD-10-CM

## 2022-11-26 DIAGNOSIS — I509 Heart failure, unspecified: Principal | ICD-10-CM

## 2022-11-26 DIAGNOSIS — Z8616 Personal history of COVID-19: Secondary | ICD-10-CM | POA: Diagnosis not present

## 2022-11-26 DIAGNOSIS — I252 Old myocardial infarction: Secondary | ICD-10-CM

## 2022-11-26 DIAGNOSIS — Z7983 Long term (current) use of bisphosphonates: Secondary | ICD-10-CM

## 2022-11-26 DIAGNOSIS — Z1152 Encounter for screening for COVID-19: Secondary | ICD-10-CM | POA: Diagnosis not present

## 2022-11-26 DIAGNOSIS — I5043 Acute on chronic combined systolic (congestive) and diastolic (congestive) heart failure: Secondary | ICD-10-CM | POA: Diagnosis present

## 2022-11-26 DIAGNOSIS — I10 Essential (primary) hypertension: Secondary | ICD-10-CM | POA: Diagnosis present

## 2022-11-26 DIAGNOSIS — G4733 Obstructive sleep apnea (adult) (pediatric): Secondary | ICD-10-CM | POA: Diagnosis present

## 2022-11-26 DIAGNOSIS — I700 Atherosclerosis of aorta: Secondary | ICD-10-CM | POA: Diagnosis present

## 2022-11-26 DIAGNOSIS — Z7901 Long term (current) use of anticoagulants: Secondary | ICD-10-CM | POA: Diagnosis not present

## 2022-11-26 DIAGNOSIS — Z9851 Tubal ligation status: Secondary | ICD-10-CM

## 2022-11-26 DIAGNOSIS — K219 Gastro-esophageal reflux disease without esophagitis: Secondary | ICD-10-CM | POA: Diagnosis present

## 2022-11-26 DIAGNOSIS — Z8249 Family history of ischemic heart disease and other diseases of the circulatory system: Secondary | ICD-10-CM

## 2022-11-26 DIAGNOSIS — Z9981 Dependence on supplemental oxygen: Secondary | ICD-10-CM

## 2022-11-26 DIAGNOSIS — J9621 Acute and chronic respiratory failure with hypoxia: Secondary | ICD-10-CM | POA: Diagnosis present

## 2022-11-26 DIAGNOSIS — E876 Hypokalemia: Secondary | ICD-10-CM | POA: Diagnosis present

## 2022-11-26 DIAGNOSIS — Z79899 Other long term (current) drug therapy: Secondary | ICD-10-CM

## 2022-11-26 DIAGNOSIS — Z888 Allergy status to other drugs, medicaments and biological substances status: Secondary | ICD-10-CM

## 2022-11-26 DIAGNOSIS — Z881 Allergy status to other antibiotic agents status: Secondary | ICD-10-CM

## 2022-11-26 DIAGNOSIS — Z7952 Long term (current) use of systemic steroids: Secondary | ICD-10-CM

## 2022-11-26 DIAGNOSIS — Z86711 Personal history of pulmonary embolism: Secondary | ICD-10-CM | POA: Diagnosis not present

## 2022-11-26 DIAGNOSIS — Z91040 Latex allergy status: Secondary | ICD-10-CM

## 2022-11-26 DIAGNOSIS — Z79631 Long term (current) use of antimetabolite agent: Secondary | ICD-10-CM

## 2022-11-26 DIAGNOSIS — J4489 Other specified chronic obstructive pulmonary disease: Secondary | ICD-10-CM | POA: Diagnosis present

## 2022-11-26 DIAGNOSIS — R0602 Shortness of breath: Secondary | ICD-10-CM | POA: Diagnosis not present

## 2022-11-26 LAB — COMPREHENSIVE METABOLIC PANEL
ALT: 10 U/L (ref 0–44)
AST: 17 U/L (ref 15–41)
Albumin: 3.2 g/dL — ABNORMAL LOW (ref 3.5–5.0)
Alkaline Phosphatase: 65 U/L (ref 38–126)
Anion gap: 7 (ref 5–15)
BUN: 22 mg/dL (ref 8–23)
CO2: 24 mmol/L (ref 22–32)
Calcium: 8.8 mg/dL — ABNORMAL LOW (ref 8.9–10.3)
Chloride: 109 mmol/L (ref 98–111)
Creatinine, Ser: 0.99 mg/dL (ref 0.44–1.00)
GFR, Estimated: 58 mL/min — ABNORMAL LOW (ref >60)
Glucose, Bld: 121 mg/dL — ABNORMAL HIGH (ref 70–99)
Potassium: 3 mmol/L — ABNORMAL LOW (ref 3.5–5.1)
Sodium: 140 mmol/L (ref 135–145)
Total Bilirubin: 0.8 mg/dL (ref 0.3–1.2)
Total Protein: 7 g/dL (ref 6.5–8.1)

## 2022-11-26 LAB — CBC WITH DIFFERENTIAL/PLATELET
Abs Immature Granulocytes: 0.01 10*3/uL (ref 0.00–0.07)
Basophils Absolute: 0 10*3/uL (ref 0.0–0.1)
Basophils Relative: 0 %
Eosinophils Absolute: 0 10*3/uL (ref 0.0–0.5)
Eosinophils Relative: 0 %
HCT: 38.5 % (ref 36.0–46.0)
Hemoglobin: 12.1 g/dL (ref 12.0–15.0)
Immature Granulocytes: 0 %
Lymphocytes Relative: 24 %
Lymphs Abs: 1.1 10*3/uL (ref 0.7–4.0)
MCH: 32.2 pg (ref 26.0–34.0)
MCHC: 31.4 g/dL (ref 30.0–36.0)
MCV: 102.4 fL — ABNORMAL HIGH (ref 80.0–100.0)
Monocytes Absolute: 0.4 10*3/uL (ref 0.1–1.0)
Monocytes Relative: 8 %
Neutro Abs: 3.1 10*3/uL (ref 1.7–7.7)
Neutrophils Relative %: 68 %
Platelets: 209 10*3/uL (ref 150–400)
RBC: 3.76 MIL/uL — ABNORMAL LOW (ref 3.87–5.11)
RDW: 13.9 % (ref 11.5–15.5)
WBC: 4.6 10*3/uL (ref 4.0–10.5)
nRBC: 0 % (ref 0.0–0.2)

## 2022-11-26 LAB — MAGNESIUM: Magnesium: 1.8 mg/dL (ref 1.7–2.4)

## 2022-11-26 LAB — TROPONIN I (HIGH SENSITIVITY): Troponin I (High Sensitivity): 12 ng/L (ref <18)

## 2022-11-26 LAB — RESP PANEL BY RT-PCR (RSV, FLU A&B, COVID)  RVPGX2
Influenza A by PCR: NEGATIVE
Influenza B by PCR: NEGATIVE
Resp Syncytial Virus by PCR: NEGATIVE
SARS Coronavirus 2 by RT PCR: NEGATIVE

## 2022-11-26 LAB — BRAIN NATRIURETIC PEPTIDE: B Natriuretic Peptide: 2966.8 pg/mL — ABNORMAL HIGH (ref 0.0–100.0)

## 2022-11-26 MED ORDER — ALBUTEROL SULFATE (2.5 MG/3ML) 0.083% IN NEBU: 3.0000 mL | INHALATION_SOLUTION | RESPIRATORY_TRACT | Status: DC | PRN

## 2022-11-26 MED ORDER — ISOSORBIDE MONONITRATE ER 30 MG PO TB24
30.0000 mg | ORAL_TABLET | Freq: Every day | ORAL | Status: DC
  Administered 2022-11-26 – 2022-11-28 (×3): 30 mg via ORAL
  Filled 2022-11-26 (×3): qty 1

## 2022-11-26 MED ORDER — FOLIC ACID 1 MG PO TABS
1.0000 mg | ORAL_TABLET | Freq: Every day | ORAL | Status: DC
  Administered 2022-11-26 – 2022-11-28 (×3): 1 mg via ORAL
  Filled 2022-11-26 (×3): qty 1

## 2022-11-26 MED ORDER — POTASSIUM CHLORIDE CRYS ER 20 MEQ PO TBCR
40.0000 meq | EXTENDED_RELEASE_TABLET | Freq: Once | ORAL | Status: AC
  Administered 2022-11-26: 40 meq via ORAL
  Filled 2022-11-26: qty 2

## 2022-11-26 MED ORDER — NITROGLYCERIN 0.4 MG SL SUBL: 0.4000 mg | SUBLINGUAL_TABLET | SUBLINGUAL | Status: DC | PRN

## 2022-11-26 MED ORDER — POTASSIUM CHLORIDE 10 MEQ/100ML IV SOLN
10.0000 meq | INTRAVENOUS | Status: DC
  Filled 2022-11-26: qty 100

## 2022-11-26 MED ORDER — HYDRALAZINE HCL 20 MG/ML IJ SOLN: 5.0000 mg | INTRAMUSCULAR | Status: DC | PRN

## 2022-11-26 MED ORDER — MONTELUKAST SODIUM 10 MG PO TABS
10.0000 mg | ORAL_TABLET | Freq: Every day | ORAL | Status: DC
  Administered 2022-11-26 – 2022-11-27 (×2): 10 mg via ORAL
  Filled 2022-11-26 (×2): qty 1

## 2022-11-26 MED ORDER — APIXABAN 2.5 MG PO TABS
2.5000 mg | ORAL_TABLET | Freq: Two times a day (BID) | ORAL | Status: DC
  Administered 2022-11-26 – 2022-11-28 (×5): 2.5 mg via ORAL
  Filled 2022-11-26 (×6): qty 1

## 2022-11-26 MED ORDER — POLYETHYLENE GLYCOL 3350 17 G PO PACK: 17.0000 g | PACK | Freq: Every day | ORAL | Status: DC | PRN

## 2022-11-26 MED ORDER — METHOTREXATE SODIUM 2.5 MG PO TABS
15.0000 mg | ORAL_TABLET | ORAL | Status: DC
  Administered 2022-11-27: 15 mg via ORAL
  Filled 2022-11-26: qty 6

## 2022-11-26 MED ORDER — ATORVASTATIN CALCIUM 20 MG PO TABS
40.0000 mg | ORAL_TABLET | Freq: Every morning | ORAL | Status: DC
  Administered 2022-11-26 – 2022-11-28 (×3): 40 mg via ORAL
  Filled 2022-11-26 (×3): qty 2

## 2022-11-26 MED ORDER — FUROSEMIDE 10 MG/ML IJ SOLN: 40.0000 mg | Freq: Once | INTRAMUSCULAR | Status: DC

## 2022-11-26 MED ORDER — PREDNISONE 20 MG PO TABS
20.0000 mg | ORAL_TABLET | Freq: Every day | ORAL | Status: DC
  Administered 2022-11-26 – 2022-11-28 (×3): 20 mg via ORAL
  Filled 2022-11-26 (×3): qty 1

## 2022-11-26 MED ORDER — ONDANSETRON HCL 4 MG/2ML IJ SOLN: 4.0000 mg | Freq: Three times a day (TID) | INTRAMUSCULAR | Status: DC | PRN

## 2022-11-26 MED ORDER — MECLIZINE HCL 25 MG PO TABS: 25.0000 mg | ORAL_TABLET | Freq: Three times a day (TID) | ORAL | Status: DC | PRN

## 2022-11-26 MED ORDER — HYDROXYCHLOROQUINE SULFATE 200 MG PO TABS
200.0000 mg | ORAL_TABLET | Freq: Every day | ORAL | Status: DC
  Administered 2022-11-26 – 2022-11-28 (×3): 200 mg via ORAL
  Filled 2022-11-26 (×3): qty 1

## 2022-11-26 MED ORDER — ACETAMINOPHEN 325 MG PO TABS
650.0000 mg | ORAL_TABLET | Freq: Four times a day (QID) | ORAL | Status: DC | PRN
  Administered 2022-11-28: 650 mg via ORAL
  Filled 2022-11-26: qty 2

## 2022-11-26 MED ORDER — FUROSEMIDE 10 MG/ML IJ SOLN
40.0000 mg | Freq: Two times a day (BID) | INTRAMUSCULAR | Status: DC
  Administered 2022-11-26 – 2022-11-28 (×5): 40 mg via INTRAVENOUS
  Filled 2022-11-26 (×5): qty 4

## 2022-11-26 MED ORDER — SACUBITRIL-VALSARTAN 24-26 MG PO TABS
1.0000 | ORAL_TABLET | Freq: Every day | ORAL | Status: DC
  Administered 2022-11-26 – 2022-11-28 (×3): 1 via ORAL
  Filled 2022-11-26 (×3): qty 1

## 2022-11-26 MED ORDER — DM-GUAIFENESIN ER 30-600 MG PO TB12: 1.0000 | ORAL_TABLET | Freq: Two times a day (BID) | ORAL | Status: DC | PRN

## 2022-11-26 MED ORDER — IPRATROPIUM-ALBUTEROL 0.5-2.5 (3) MG/3ML IN SOLN
3.0000 mL | Freq: Once | RESPIRATORY_TRACT | Status: AC
  Administered 2022-11-26: 3 mL via RESPIRATORY_TRACT
  Filled 2022-11-26: qty 3

## 2022-11-26 MED ORDER — CARVEDILOL 3.125 MG PO TABS
3.1250 mg | ORAL_TABLET | Freq: Two times a day (BID) | ORAL | Status: DC
  Administered 2022-11-26 – 2022-11-28 (×4): 3.125 mg via ORAL
  Filled 2022-11-26 (×4): qty 1

## 2022-11-26 NOTE — ED Notes (Signed)
Advised nurse that patient has ready bed 

## 2022-11-26 NOTE — ED Notes (Signed)
Pt repositioned in bed.

## 2022-11-26 NOTE — H&P (Signed)
History and Physical    Tina Fields [id number] DOB: November 30, 1945 DOA: 11/26/2022  Referring MD/NP/PA:   PCP: Letta Median, MD   Patient coming from:  The patient is coming from home.   Chief Complaint: SOB  HPI: Tina Fields is a 77 y.o. 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  Patient was recently hospitalized from 3/8-3/10 due to CHF exacerbation.  2D echo was done, which showed EF of 20-25% with grade 1 diastolic dysfunction.  Patient comes back due to worsening shortness of breath in the past several days.  She has cough with little mucus production.  Denies chest pain, fever or chills.  Patient has nausea, no vomiting, diarrhea or abdominal pain.  No symptoms of UTI.   Patient states that she is taking her diuretics consistently.  She has worsening leg edema bilaterally.  She took her Eliquis last night. No recent fall, denies dark stool or rectal bleeding.   Data reviewed independently and ED Course: pt was found to have BNP 2966, trop 12, WBC 6.4, potassium 3.0, creatinine 0.99, BUN 22, temperature normal, blood pressure 145/86, heart rate 73, RR 24, oxygen saturation 98% on home level 2 L oxygen.  Chest x-ray showed pulmonary edema.  Patient is admitted to telemetry bed as inpatient.   EKG: I have personally reviewed.  Sinus rhythm, QTc 471, LAD, occasional PVC   Review of Systems:   General: no fevers, chills, no body weight gain, has fatigue HEENT: no blurry vision, hearing changes or sore throat Respiratory: has dyspnea, coughing, no wheezing CV: no chest pain, no palpitations GI: has nausea, no vomiting, abdominal pain, diarrhea, constipation GU: no dysuria, burning on urination, increased urinary frequency, hematuria  Ext: has leg edema Neuro: no unilateral weakness,  numbness, or tingling, no vision change or hearing loss Skin: no rash, no skin tear. MSK: No muscle spasm, no deformity, no limitation of range of movement in spin Heme: No easy bruising.  Travel history: No recent long distant travel.   Allergy:  Allergies  Allergen Reactions   Ace Inhibitors Cough    Other reaction(s): Cough    Hydrochlorothiazide Other (See Comments)    Cramps   Latex Hives   Azithromycin Rash    Past Medical History:  Diagnosis Date   Aortic atherosclerosis (HCC)    Asthma    Chest pain 11/10/2022   CHF (congestive heart failure) (HCC)    Coronary artery disease    Dyspnea    Edema of both legs    GERD (gastroesophageal reflux disease)    HFrEF (heart failure with reduced ejection fraction) ([place])    History of 2019 novel coronavirus disease (COVID-19) 10/28/2020   Hypertension    Myocardial infarction (HCC)    OSA (obstructive sleep apnea)    noncompliant with nocturnal PAP therapy   RA (rheumatoid arthritis) ([place])     Past Surgical History:  Procedure Laterality Date   BREAST SURGERY     CARDIAC CATHETERIZATION Left 06/21/2005   Moderate ostial LCx and LAD disease; LVEF 45%; Location: [facility]; Surgeon: Neoma Laming, MD   CARDIAC CATHETERIZATION Left 05/20/2008   No occlusive CAD; LVEF 65%; Location: [facility]; Surgeon: Katrine Coho, MD   COLONOSCOPY     ESOPHAGOGASTRODUODENOSCOPY     TUBAL LIGATION      Social History:  reports that she quit smoking about  27 years ago. Her smoking use included cigarettes. She has never used smokeless tobacco. She reports that she does not drink alcohol and does not use drugs.  Family History:  Family History  Problem Relation Age of Onset   CAD Mother    Diabetes Mother    Hypertension Mother    CAD Father    Diabetes Father    Hypertension Father    Stroke Father      Prior to Admission medications   Medication Sig Start Date End Date Taking? Authorizing Provider  acetaminophen (TYLENOL) 650 MG CR  tablet Take 1,300 mg by mouth as needed for pain.    [provider]  albuterol (PROVENTIL) (2.5 MG/3ML) 0.083% nebulizer solution Take 2.5 mg by nebulization every 6 (six) hours as needed for wheezing or shortness of breath.    [provider]  albuterol (VENTOLIN HFA) 108 (90 Base) MCG/ACT inhaler Inhale 1-2 puffs into the lungs every 6 (six) hours as needed for wheezing or shortness of breath. 11/06/20   [provider]  alendronate (FOSAMAX) 70 MG tablet Take 70 mg by mouth once a week.    [provider]  apixaban (ELIQUIS) 2.5 MG TABS tablet Take 1 tablet (2.5 mg total) by mouth 2 (two) times daily. 08/08/22   Earlie Server, MD  atorvastatin (LIPITOR) 40 MG tablet Take 1 tablet (40 mg total) by mouth every morning. 12/05/21   Kayleen Memos, DO  carvedilol (COREG) 3.125 MG tablet Take 1 tablet (3.125 mg total) by mouth 2 (two) times daily with a meal. 11/12/22   Nicole Kindred A, DO  [facility] VITAMIN B12 1000 MCG TBCR Take 1,000 mcg by mouth daily. 12/25/21   [provider]  ergocalciferol (VITAMIN D2) 1.25 MG (50000 UT) capsule Take 50,000 Units by mouth once a week. Every monday    [provider]  fluticasone (FLONASE) 50 MCG/ACT nasal spray Place 2 sprays into the nose daily as needed for allergies. 12/03/18   [provider]  folic acid (FOLVITE) 1 MG tablet Take 1 mg by mouth daily. 09/19/21   [provider]  furosemide (LASIX) 20 MG tablet Take 1 tablet (20 mg total) by mouth daily. 12/05/21   Kayleen Memos, DO  hydroxychloroquine (PLAQUENIL) 200 MG tablet Take 200 mg by mouth daily.    [provider]  isosorbide mononitrate (IMDUR) 30 MG 24 hr tablet Take 30 mg by mouth daily.    [provider]  meclizine (ANTIVERT) 25 MG tablet Take 25 mg by mouth 3 (three) times daily as needed.    [provider]  methotrexate (RHEUMATREX) 2.5 MG tablet Take 15 mg by mouth once a week. 08/17/22   [provider]  midodrine (PROAMATINE) 5 MG tablet Take 1 tablet (5 mg total) by mouth 3 (three) times daily with meals. 11/12/22   Ezekiel Slocumb, DO  MIRALAX 17 GM/SCOOP powder Take 17 g by mouth daily as needed for moderate constipation. Dissolve 17 gm in 8 ounces of water and drink 01/11/21   [provider]  montelukast (SINGULAIR) 10 MG tablet Take 10 mg by mouth at bedtime.    [provider]  nitroGLYCERIN (NITROSTAT) 0.4 MG SL tablet Place 1 tablet (0.4 mg total) under the tongue every 5 (five) minutes as needed for chest pain. 11/12/22   Ezekiel Slocumb, DO  predniSONE (DELTASONE) 5 MG tablet Take 5 mg by mouth daily. 10/01/22   [provider]  sacubitril-valsartan Delene Loll) 24-26  MG Take 1 tablet by mouth 2 (two) times daily.    [provider]  spironolactone (ALDACTONE) 25 MG tablet Take 0.5 tablets (12.5 mg total) by mouth daily. 11/13/22   Ezekiel Slocumb, DO    Physical Exam: Vitals:   11/26/22 1100 11/26/22 1115 11/26/22 1130 11/26/22 1200  BP: 139/76  (!) 143/78 (!) 151/85  Pulse: 71 77 77 73  Resp: 18 20 (!) 22 (!) 32  Temp:      TempSrc:      SpO2: 100% 100% 100% 100%  Weight:      Height:       General: Not in acute distress HEENT:       Eyes: PERRL, EOMI, no scleral icterus.       ENT: No discharge from the ears and nose, no pharynx injection, no tonsillar enlargement.        Neck: positive JVD, no bruit, no mass felt. Heme: No neck lymph node enlargement. Cardiac: S1/S2, RRR, No murmurs, No gallops or rubs. Respiratory: has fine crackles bilaterally GI: Soft, nondistended, nontender, no rebound pain, no organomegaly, BS present. GU: No hematuria Ext: 2+ pitting leg edema bilaterally. 1+DP/PT pulse bilaterally. Musculoskeletal: has finger deformities, No joint redness or warmth, no limitation of ROM in spin. Skin: No rashes.  Neuro: Alert, oriented X3, cranial nerves II-XII grossly intact, moves all extremities normally. Psych:  Patient is not psychotic, no suicidal or hemocidal ideation.  Labs on Admission: I have personally reviewed following labs and imaging studies  CBC: Recent Labs  Lab 11/26/22 0943  WBC 4.6  NEUTROABS 3.1  HGB 12.1  HCT 38.5  MCV 102.4*  PLT [id number]   Basic Metabolic Panel: Recent Labs  Lab 11/26/22 0943  NA 140  K 3.0*  CL 109  CO2 24  GLUCOSE 121*  BUN 22  CREATININE 0.99  CALCIUM 8.8*  MG 1.8   GFR: Estimated Creatinine Clearance: 49.1 mL/min (by C-G formula based on SCr of 0.99 mg/dL). Liver Function Tests: Recent Labs  Lab 11/26/22 0943  AST 17  ALT 10  ALKPHOS 65  BILITOT 0.8  PROT 7.0  ALBUMIN 3.2*   No results for input(s): "LIPASE", "AMYLASE" in the last 168 hours. No results for input(s): "AMMONIA" in the last 168 hours. Coagulation Profile: No results for input(s): "INR", "PROTIME" in the last 168 hours. Cardiac Enzymes: No results for input(s): "CKTOTAL", "CKMB", "CKMBINDEX", "TROPONINI" in the last 168 hours. BNP (last 3 results) No results for input(s): "PROBNP" in the last 8760 hours. HbA1C: No results for input(s): "HGBA1C" in the last 72 hours. CBG: No results for input(s): "GLUCAP" in the last 168 hours. Lipid Profile: No results for input(s): "CHOL", "HDL", "LDLCALC", "TRIG", "CHOLHDL", "LDLDIRECT" in the last 72 hours. Thyroid Function Tests: No results for input(s): "TSH", "T4TOTAL", "FREET4", "T3FREE", "THYROIDAB" in the last 72 hours. Anemia Panel: No results for input(s): "VITAMINB12", "FOLATE", "FERRITIN", "TIBC", "IRON", "RETICCTPCT" in the last 72 hours. Urine analysis:    Component Value Date/Time   COLORURINE YELLOW (A) 07/28/2022 1733   APPEARANCEUR HAZY (A) 07/28/2022 1733   APPEARANCEUR Clear 01/16/2012 0248   LABSPEC 1.019 07/28/2022 1733   LABSPEC 1.009 01/16/2012 0248   PHURINE 6.0 07/28/2022 1733   GLUCOSEU NEGATIVE 07/28/2022 1733   GLUCOSEU Negative 01/16/2012 0248   HGBUR MODERATE (A) 07/28/2022 1733    BILIRUBINUR NEGATIVE 07/28/2022 1733   BILIRUBINUR Negative 01/16/2012 0248   KETONESUR 5 (A) 07/28/2022 1733   PROTEINUR 30 (A) 07/28/2022 1733  NITRITE NEGATIVE 07/28/2022 1733   LEUKOCYTESUR LARGE (A) 07/28/2022 1733   LEUKOCYTESUR Negative 01/16/2012 0248   Sepsis Labs: @LABRCNTIP (procalcitonin:4,lacticidven:4) ) Recent Results (from the past 240 hour(s))  Resp panel by RT-PCR (RSV, Flu A&B, Covid) Anterior Nasal Swab     Status: None   Collection Time: 11/26/22  9:43 AM   Specimen: Anterior Nasal Swab  Result Value Ref Range Status   SARS Coronavirus 2 by RT PCR NEGATIVE NEGATIVE Final    Comment: (NOTE) SARS-CoV-2 target nucleic acids are NOT DETECTED.  The SARS-CoV-2 RNA is generally detectable in upper respiratory specimens during the acute phase of infection. The lowest concentration of SARS-CoV-2 viral copies this assay can detect is 138 copies/mL. A negative result does not preclude SARS-Cov-2 infection and should not be used as the sole basis for treatment or other patient management decisions. A negative result may occur with  improper specimen collection/handling, submission of specimen other than nasopharyngeal swab, presence of viral mutation(s) within the areas targeted by this assay, and inadequate number of viral copies(<138 copies/mL). A negative result must be combined with clinical observations, patient history, and epidemiological information. The expected result is Negative.  Fact Sheet for Patients:  EntrepreneurPulse.com.au  Fact Sheet for Healthcare Providers:  IncredibleEmployment.be  This test is no t yet approved or cleared by the [place] FDA and  has been authorized for detection and/or diagnosis of SARS-CoV-2 by FDA under an Emergency Use Authorization (EUA). This EUA will remain  in effect (meaning this test can be used) for the duration of the COVID-19 declaration under Section 564(b)(1) of the Act,  21 U.S.C.section 360bbb-3(b)(1), unless the authorization is terminated  or revoked sooner.       Influenza A by PCR NEGATIVE NEGATIVE Final   Influenza B by PCR NEGATIVE NEGATIVE Final    Comment: (NOTE) The Xpert Xpress SARS-CoV-2/FLU/RSV plus assay is intended as an aid in the diagnosis of influenza from Nasopharyngeal swab specimens and should not be used as a sole basis for treatment. Nasal washings and aspirates are unacceptable for Xpert Xpress SARS-CoV-2/FLU/RSV testing.  Fact Sheet for Patients: EntrepreneurPulse.com.au  Fact Sheet for Healthcare Providers: IncredibleEmployment.be  This test is not yet approved or cleared by the [place] FDA and has been authorized for detection and/or diagnosis of SARS-CoV-2 by FDA under an Emergency Use Authorization (EUA). This EUA will remain in effect (meaning this test can be used) for the duration of the COVID-19 declaration under Section 564(b)(1) of the Act, 21 U.S.C. section 360bbb-3(b)(1), unless the authorization is terminated or revoked.     Resp Syncytial Virus by PCR NEGATIVE NEGATIVE Final    Comment: (NOTE) Fact Sheet for Patients: EntrepreneurPulse.com.au  Fact Sheet for Healthcare Providers: IncredibleEmployment.be  This test is not yet approved or cleared by the [place] FDA and has been authorized for detection and/or diagnosis of SARS-CoV-2 by FDA under an Emergency Use Authorization (EUA). This EUA will remain in effect (meaning this test can be used) for the duration of the COVID-19 declaration under Section 564(b)(1) of the Act, 21 U.S.C. section 360bbb-3(b)(1), unless the authorization is terminated or revoked.  Performed at [facility], [address] [place], [place] [address]      Radiological Exams on Admission: DG Chest 2 View  Result Date: 11/26/2022 CLINICAL DATA:  Shortness of breath EXAM: CHEST -  2 VIEW COMPARISON:  11/10/2022 FINDINGS: Cardiomegaly. Aortic atherosclerosis. Pulmonary vascular congestion. Diffuse bilateral interstitial opacities. No significant pleural fluid collection. No pneumothorax. IMPRESSION: Appearance  most suggestive of CHF with pulmonary edema. Electronically Signed   By: Davina Poke D.O.   On: 11/26/2022 10:06      Assessment/Plan Principal Problem:   Acute on chronic combined systolic and diastolic congestive heart failure (HCC) Active Problems:   CAD   Essential hypertension   COPD with asthma   Dyslipidemia   Rheumatoid arthritis, unspecified ([place])   History of pulmonary embolism   Hypokalemia   Assessment and Plan:  Acute on chronic combined systolic and diastolic congestive heart failure [facility]): Patient has 2+ leg edema, positive JVD, elevated BNP 2966, pulm edema by chest x-ray, clinically consistent with CHF exacerbation.  2D echo on 11/11/2022 showed EF 20-25% with grade 1 diastolic dysfunction.  -Will admit to tele bed as inpatient -Lasix 40 mg bid by IV -entresto -Daily weights -strict I/O's -Low salt diet -Fluid restriction -Obtain REDs Vest reading  CAD: No CP. Trop  12. -Lipitor, imdur, coreg  COPD with asthma: Stable, not exacerbated. --continue Singulair and bronchodilators   Rheumatoid arthritis, unspecified ([place]): Seems stable, no active flare --continue home Plaquenil, prednisone and methotrexate --Previously on Humira, appears stopped due to recurrent sinus infections - will increase prednisone dose up from 5 to 20 mg daily due to stress   History of pulmonary embolism --continue Eliquis   Essential hypertension: -IV hydralazine as needed -Patient is on IV Lasix -coreg, imdur  Dyslipidemia:  -Lipitor  Hypokalemia: Potassium 3.0.  Magnesium 1.8 -Repleted potassium      DVT ppx: on Eliquis  Code Status: DNR (I discussed with patient and explained the meaning of [place]. Patient wants to be  DNR)  Family Communication: Yes, patient's cousin by phone  Disposition Plan:  Anticipate discharge back to previous environment  Consults called:  none  Admission status and Level of care: Telemetry Cardiac:    as inpt       Dispo: The patient is from: Home              Anticipated d/c is to: Home              Anticipated d/c date is: 2 days              Patient currently is not medically stable to d/c.    Severity of Illness:  The appropriate patient status for this patient is INPATIENT. Inpatient status is judged to be reasonable and necessary in order to provide the required intensity of service to ensure the patient's safety. The patient's presenting symptoms, physical exam findings, and initial radiographic and laboratory data in the context of their chronic comorbidities is felt to place them at high risk for further clinical deterioration. Furthermore, it is not anticipated that the patient will be medically stable for discharge from the hospital within 2 midnights of admission.   * I certify that at the point of admission it is my clinical judgment that the patient will require inpatient hospital care spanning beyond 2 midnights from the point of admission due to high intensity of service, high risk for further deterioration and high frequency of surveillance required.*       Date of Service 11/26/2022    Ivor Costa [facility]   If 7PM-7AM, please contact night-coverage [email or web address] 11/26/2022, 12:39 PM

## 2022-11-26 NOTE — ED Triage Notes (Signed)
Pt to ED via [facility] from. Pt reports SOB since yesterday. Pt with hx CHF and COPD. Initial sats 85% with FD on 2L [place] chronic. Pt is on blood thinners. 18g RH   HR 75 CBG 144 135/99 97.9 oral

## 2022-11-26 NOTE — ED Notes (Signed)
Pt transported to xray - will administer medication upon return

## 2022-11-26 NOTE — ED Provider Notes (Signed)
[facility] Provider Note    Event Date/Time   First MD Initiated Contact with Patient 11/26/22 0935     (approximate)   History   Shortness of Breath   HPI  Tina Fields is a 77 y.o. female with history of CHF, GERD, hypertension, PE on Eliquis, rheumatoid arthritis, asthma who comes in with concern for shortness of breath.  Patient reports shortness of breath since yesterday.  She does report a history of CHF, COPD.  She is on 2 L at baseline.  There was concern for an sat of 85% on 2 L.  Patient was transported on 3 L.  Here she reports that she has been compliant with her diuretics and she reports no significant weight gain.  She denies any chest pain, falls hitting her head, abdominal pain.  She denies any severe cough, congestion  I reviewed the note from 11/10/2022 patient had elevated BNP with x-ray concerning for edema.  Patient had a mild stress test done that showed an EF of less than 25% and she was diuresed.  Patient also been seen in the emergency room on November 2023 with a CT scan did not show any evidence of acute PE and has resolving right lung thrombus that was seen in May.   Physical Exam   Triage Vital Signs: ED Triage Vitals  Enc Vitals Group     BP 11/26/22 0939 (!) 145/86     Pulse Rate 11/26/22 0939 73     Resp 11/26/22 0939 (!) 24     Temp 11/26/22 0939 97.9 F (36.6 C)     Temp Source 11/26/22 0939 Oral     SpO2 11/26/22 0939 98 %     Weight 11/26/22 0937 176 lb 2.4 oz (79.9 kg)     Height 11/26/22 0937 5\' 6"  (1.676 m)     Head Circumference --      Peak Flow --      Pain Score 11/26/22 0937 0     Pain Loc --      Pain Edu? --      Excl. in [place]? --     Most recent vital signs: Vitals:   11/26/22 0939  BP: (!) 145/86  Pulse: 73  Resp: (!) 24  Temp: 97.9 F (36.6 C)  SpO2: 98%     General: Awake, no distress.  CV:  Good peripheral perfusion.  Resp:  Normal effort.  Abd:  No distention.  Other:  No  wheezing.  No swelling in legs.  No calf tenderness.   ED Results / Procedures / Treatments   Labs (all labs ordered are listed, but only abnormal results are displayed) Labs Reviewed  RESP PANEL BY RT-PCR (RSV, FLU A&B, COVID)  [id number]  CBC WITH DIFFERENTIAL/PLATELET  COMPREHENSIVE METABOLIC PANEL  BRAIN NATRIURETIC PEPTIDE  TROPONIN I (HIGH SENSITIVITY)     EKG  My interpretation of EKG:  Normal sinus rate of 77 without any ST elevation or T wave inversions with occasional PVC, normal intervals  RADIOLOGY I have reviewed the xray personally and interpreted and patient has pulmonary edema  PROCEDURES:  Critical Care performed: No  .1-3 Lead EKG Interpretation  Performed by: Vanessa [place], MD Authorized by: Vanessa [place], MD     Interpretation: normal     ECG rate:  70   ECG rate assessment: normal     Rhythm: sinus rhythm     Ectopy: PVCs     Conduction: normal  MEDICATIONS ORDERED IN ED: Medications  potassium chloride SA (KLOR-CON M) CR tablet 40 mEq (has no administration in time range)  potassium chloride 10 mEq in 100 mL IVPB (has no administration in time range)  albuterol (PROVENTIL) (2.5 MG/3ML) 0.083% nebulizer solution 3 mL (has no administration in time range)  dextromethorphan-guaiFENesin (MUCINEX DM) 30-600 MG per 12 hr tablet 1 tablet (has no administration in time range)  ondansetron (ZOFRAN) injection 4 mg (has no administration in time range)  hydrALAZINE (APRESOLINE) injection 5 mg (has no administration in time range)  acetaminophen (TYLENOL) tablet 650 mg (has no administration in time range)  furosemide (LASIX) injection 40 mg (has no administration in time range)  ipratropium-albuterol (DUONEB) 0.5-2.5 (3) MG/3ML nebulizer solution 3 mL (3 mLs Nebulization Given 11/26/22 1013)     IMPRESSION / MDM / ASSESSMENT AND PLAN / ED COURSE  I reviewed the triage vital signs and the nursing notes.   Patient's presentation is most consistent  with severe exacerbation of chronic illness.   Differential is ACS, CHF, PE seems less likely given on blood thinner.  No obvious wheezing to suggest COPD, asthma.  CBC reassuring CMP shows slightly low potassium patient given some IV and oral repletion.  BMP almost 3000.  Troponin is stable.    Chest x-ray consistent with pulmonary edema.  Discussed with patient given her age and concern for worsening hypoxia although she stable on her 2 L suspect that it would get worse with ambulation will admit for IV diuresis.  Patient was given 40 of IV Lasix.  The patient is on the cardiac monitor to evaluate for evidence of arrhythmia and/or significant heart rate changes.      FINAL CLINICAL IMPRESSION(S) / ED DIAGNOSES   Final diagnoses:  Acute on chronic congestive heart failure, unspecified heart failure type ([place])  Acute pulmonary edema (HCC)  Hypokalemia     Rx / DC Orders   ED Discharge Orders     None        Note:  This document was prepared using Dragon voice recognition software and may include unintentional dictation errors.   Vanessa [place], MD 11/26/22 1125

## 2022-11-27 DIAGNOSIS — I5043 Acute on chronic combined systolic (congestive) and diastolic (congestive) heart failure: Secondary | ICD-10-CM | POA: Diagnosis not present

## 2022-11-27 LAB — BASIC METABOLIC PANEL
Anion gap: 10 (ref 5–15)
BUN: 24 mg/dL — ABNORMAL HIGH (ref 8–23)
CO2: 25 mmol/L (ref 22–32)
Calcium: 8.6 mg/dL — ABNORMAL LOW (ref 8.9–10.3)
Chloride: 105 mmol/L (ref 98–111)
Creatinine, Ser: 0.97 mg/dL (ref 0.44–1.00)
GFR, Estimated: 59 mL/min — ABNORMAL LOW (ref >60)
Glucose, Bld: 111 mg/dL — ABNORMAL HIGH (ref 70–99)
Potassium: 3.7 mmol/L (ref 3.5–5.1)
Sodium: 140 mmol/L (ref 135–145)

## 2022-11-27 LAB — MAGNESIUM: Magnesium: 1.6 mg/dL — ABNORMAL LOW (ref 1.7–2.4)

## 2022-11-27 MED ORDER — MAGNESIUM SULFATE 2 GM/50ML IV SOLN
2.0000 g | Freq: Once | INTRAVENOUS | Status: AC
  Administered 2022-11-27: 2 g via INTRAVENOUS
  Filled 2022-11-27: qty 50

## 2022-11-27 NOTE — Consult Note (Signed)
   Heart Failure Nurse Navigator Note  HFrEF 20 to 25%.  Left ventricular internal cavity is mild to moderately dilated.  Left ventricular diastolic parameters consistent with grade 1 diastolic dysfunction.  Right ventricular systolic function is mildly reduced.  Mild biatrial enlargement.  Mild mitral regurgitation.  She presented to the emergency room with complaints of worsening shortness of breath, worsening lower extremity edema.  BNP 2966.  Chest x-ray with pulmonary edema.  Comorbidities:  Coronary artery disease Hypertension COPD Hyperlipidemia Rheumatoid arthritis History of pulmonary embolism  Medications:  Apixaban 2.5 mg 2 times a day Atorvastatin 40 mg daily Carvedilol 3.125 mg 2 times a day with meals Furosemide 40 mg IV every 12 hours Isosorbide mononitrate 30 mg daily Magnesium sulfate 2 g today Entresto 24/26 mg 1 tablet daily  Labs:  Sodium 140, potassium 3.7, chloride 105, CO2 25, BUN 24, creatinine 0.97, estimated GFR 59, magnesium 1.6. Weight is 75.8 kg Intake not documented Output 1500 mL.   Initial meeting with patient on this admission.  She was last in the hospital and discharged on March 10 with heart failure.  States that she had been weighing herself daily and had noted 1/2 pound weight gain.  She also states on Thursday she had noted she was more short of breath and called her home health nurse she came and listen to her lungs and told her that her lungs were "clear."  In addition to monitoring weights, also went over monitoring symptoms such as worsening shortness of breath, lower extremity edema, PND and orthopnea.  Discussed how she takes care of herself at home.  She states that she had been compliant with weighing herself every day, but she states that she had not been following of fluid restriction.  She states that she now realizes that that was her mistake as she likes to drink a lot of Pepsi's.  Discussed taking in no more than 64 ounces  daily and what constitutes a liquid.  She voices understanding.  She states that she does not use salt on her food at the table.  She tries to eat low-sodium foods.  She had no further questions and we will consider continue to follow along.  Pricilla Riffle RN [facility]

## 2022-11-27 NOTE — TOC Progression Note (Signed)
Transition of Care [facility]) - Progression Note    Patient Details  Name: Tina Fields MRN: [id number] Date of Birth: 21-May-1946  Transition of [facility]) CM/SW Contact  Laurena Slimmer, RN Phone Number: 11/27/2022, 11:30 AM  Clinical Narrative:    Case reviewed for DME needs and changes in discharge disposition.         Expected Discharge Plan and Services                                               Social Determinants of Health (SDOH) Interventions SDOH Screenings   Food Insecurity: No Food Insecurity (11/10/2022)  Housing: Low Risk  (11/10/2022)  Transportation Needs: No Transportation Needs (11/10/2022)  Utilities: Not At Risk (11/10/2022)  Financial Resource Strain: Unknown (08/21/2017)  Physical Activity: Unknown (08/21/2017)  Social Connections: Unknown (08/21/2017)  Stress: Unknown (08/21/2017)  Tobacco Use: Medium Risk (11/26/2022)    Readmission Risk Interventions    12/04/2021    2:04 PM  Readmission Risk Prevention Plan  Transportation Screening Complete  PCP or Specialist Appt within 5-7 Days Complete  Home Care Screening Complete  Medication Review (RN CM) Referral to Pharmacy

## 2022-11-27 NOTE — Discharge Instructions (Signed)

## 2022-11-27 NOTE — Progress Notes (Signed)
  Progress Note   Patient: Tina Fields [id number] DOB: 03-22-1946 DOA: 11/26/2022     1 DOS: the patient was seen and examined on 11/27/2022    Subjective:  Patient seen and examined at bedside this morning She is currently requiring 2 L of intranasal oxygen She tells me she uses oxygen at Magnesium level is still low at 1.6, we are replacing as needed Denies chest pain nausea vomiting   Brief hospital course: Tina Fields is a 77 y.o. 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  She is currently being evaluated for acute on chronic CHF exacerbation  Assessment and Plan:  Acute on chronic combined systolic and diastolic congestive heart failure [facility]): Patient has 2+ leg edema, positive JVD, elevated BNP 2966, pulm edema by chest x-ray, clinically consistent with CHF exacerbation.  2D echo on 11/11/2022 showed EF 20-25% with grade 1 diastolic dysfunction.   -Continue management on telemetry unit -Continue Lasix 40 mg bid by IV -Continue entresto -Daily weights -strict I/O's -Low salt diet -Fluid restriction    CAD: No CP. Trop  12. 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 --continue Singulair and bronchodilators   Rheumatoid arthritis, unspecified ([place]): Seems stable, no active flare --continue home Plaquenil, prednisone and methotrexate --Previously on Humira, appears stopped due to recurrent sinus infections - will increase prednisone dose up from 5 to 20 mg daily due to stress   History of pulmonary embolism --continue Eliquis   Essential hypertension: -IV hydralazine as needed -Patient is on IV Lasix -coreg, imdur   Dyslipidemia:  -Lipitor   Hypokalemia: Potassium 3.0.  Magnesium 1.8 -Repleted potassium     DVT ppx: on Eliquis   Code Status:  dnr   Family Communication: Yes, patient's cousin by phone   Disposition Plan:  Anticipate discharge back to previous environment   Consults called:  none   Physical Exam:  General: Not in acute distress HEENT: Eyes: PERRL, EOMI, no scleral icterus. ENT: No discharge from the ears and nose,        Neck: positive JVD, no bruit, no mass felt. Heme: No neck lymph node enlargement. Cardiac: S1/S2, RRR, No murmurs, No gallops or rubs. Respiratory: has fine crackles bilaterally GI: Soft, nondistended, nontender, no rebound pain, no organomegaly, BS present. GU: No hematuria Ext: 2+ pitting leg edema bilaterally. 1+DP/PT pulse bilaterally. Musculoskeletal: has finger deformities, No joint redness or warmth, no limitation of ROM in spin. Skin: No rashes.  Neuro: Alert, oriented X3, cranial nerves II-XII grossly intact  Psych: Patient is not psychotic   Vitals:   11/27/22 0348 11/27/22 0436 11/27/22 0807 11/27/22 1154  BP: 124/69  113/61 105/67  Pulse: 79  72 83  Resp: 20  16 16   Temp: 97.6 F (36.4 C)  97.6 F (36.4 C) 97.6 F (36.4 C)  TempSrc:      SpO2: 98%  99% 93%  Weight:  75.8 kg    Height:        Disposition: Status is: Inpatient   Time spent: 40 minutes  Author: Verline Lema, MD 11/27/2022 2:34 PM  For on call review [email or web address].

## 2022-11-28 DIAGNOSIS — I5043 Acute on chronic combined systolic (congestive) and diastolic (congestive) heart failure: Secondary | ICD-10-CM | POA: Diagnosis not present

## 2022-11-28 LAB — BASIC METABOLIC PANEL
Anion gap: 8 (ref 5–15)
BUN: 32 mg/dL — ABNORMAL HIGH (ref 8–23)
CO2: 27 mmol/L (ref 22–32)
Calcium: 8.2 mg/dL — ABNORMAL LOW (ref 8.9–10.3)
Chloride: 104 mmol/L (ref 98–111)
Creatinine, Ser: 0.96 mg/dL (ref 0.44–1.00)
GFR, Estimated: >60 mL/min (ref >60)
Glucose, Bld: 112 mg/dL — ABNORMAL HIGH (ref 70–99)
Potassium: 3.4 mmol/L — ABNORMAL LOW (ref 3.5–5.1)
Sodium: 139 mmol/L (ref 135–145)

## 2022-11-28 LAB — CBC WITH DIFFERENTIAL/PLATELET
Abs Immature Granulocytes: 0.01 10*3/uL (ref 0.00–0.07)
Basophils Absolute: 0 10*3/uL (ref 0.0–0.1)
Basophils Relative: 0 %
Eosinophils Absolute: 0 10*3/uL (ref 0.0–0.5)
Eosinophils Relative: 0 %
HCT: 38.4 % (ref 36.0–46.0)
Hemoglobin: 12.4 g/dL (ref 12.0–15.0)
Immature Granulocytes: 0 %
Lymphocytes Relative: 24 %
Lymphs Abs: 1.4 10*3/uL (ref 0.7–4.0)
MCH: 32.4 pg (ref 26.0–34.0)
MCHC: 32.3 g/dL (ref 30.0–36.0)
MCV: 100.3 fL — ABNORMAL HIGH (ref 80.0–100.0)
Monocytes Absolute: 0.6 10*3/uL (ref 0.1–1.0)
Monocytes Relative: 10 %
Neutro Abs: 4 10*3/uL (ref 1.7–7.7)
Neutrophils Relative %: 66 %
Platelets: 208 10*3/uL (ref 150–400)
RBC: 3.83 MIL/uL — ABNORMAL LOW (ref 3.87–5.11)
RDW: 14.1 % (ref 11.5–15.5)
WBC: 6.1 10*3/uL (ref 4.0–10.5)
nRBC: 0 % (ref 0.0–0.2)

## 2022-11-28 LAB — MAGNESIUM: Magnesium: 2 mg/dL (ref 1.7–2.4)

## 2022-11-28 MED ORDER — POTASSIUM CHLORIDE CRYS ER 20 MEQ PO TBCR
40.0000 meq | EXTENDED_RELEASE_TABLET | ORAL | Status: AC
  Administered 2022-11-28 (×2): 40 meq via ORAL
  Filled 2022-11-28 (×2): qty 2

## 2022-11-28 NOTE — Progress Notes (Signed)
   Heart Failure Nurse Navigator Note  Met with patient this morning, she is lying supine in bed in no acute distress.  She states that she is going to be discharged home today she feels she is ready to go.  By teach back method discussed how she is going to take care of herself at home, with daily weights, watching her fluid restriction along with sodium restriction.  So discussed signs and symptoms and weight gains to report.  She appears to have good understanding.  Will follow-up in the outpatient heart failure clinic and reminded to bring her medication bottles with her to her an appointment.  She had no further questions.  Pricilla Riffle RN [facility]

## 2022-11-28 NOTE — Discharge Summary (Signed)
Physician Discharge Summary   Patient: Tina Fields MRN: [id number] DOB: April 06, 1946  Admit date:     11/26/2022  Discharge date: 11/28/22  Discharge Physician: Verline Lema   PCP: Letta Median, MD     Discharge Diagnoses: Acute on chronic combined systolic and diastolic congestive heart failure [facility]):  CAD:  COPD with asthma: Stable, not exacerbated. Rheumatoid arthritis, unspecified ([place]): Seems stable, no active flare History of pulmonary embolism Essential hypertension: Dyslipidemia:  Hypokalemia:     Hospital Course: Tina Fields is a 77 y.o. 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  Patient was managed for acute on chronic exacerbation of CHF .she follows up with pulmonary Dr. Wynell Balloon and has an upcoming appointment .her respiratory function is much better today and therefore being discharged home to follow-up with her cardiologist .  She has been counseled on fluid restriction therapy and to continue her medications as prescribed .   Procedures performed: NONE  Disposition: Home Diet recommendation:  Discharge Diet Orders (From admission, onward)     Start     Ordered   11/28/22 0000  Diet - low sodium heart healthy        11/28/22 1152           Cardiac diet DISCHARGE MEDICATION: Allergies as of 11/28/2022       Reactions   Ace Inhibitors Cough   Other reaction(s): Cough   Hydrochlorothiazide Other (See Comments)   Cramps   Latex Hives   Azithromycin Rash        Medication List     TAKE these medications    acetaminophen 650 MG CR tablet Commonly known as: TYLENOL Take 1,300 mg by mouth as needed for pain.   albuterol 108 (90 Base) MCG/ACT inhaler Commonly known as: VENTOLIN HFA Inhale 1-2 puffs into the lungs every 6 (six) hours as needed for  wheezing or shortness of breath.   albuterol (2.5 MG/3ML) 0.083% nebulizer solution Commonly known as: PROVENTIL Take 2.5 mg by nebulization every 6 (six) hours as needed for wheezing or shortness of breath.   alendronate 70 MG tablet Commonly known as: FOSAMAX Take 70 mg by mouth once a week.   apixaban 2.5 MG Tabs tablet Commonly known as: Eliquis Take 1 tablet (2.5 mg total) by mouth 2 (two) times daily.   atorvastatin 40 MG tablet Commonly known as: LIPITOR Take 1 tablet (40 mg total) by mouth every morning.   carvedilol 3.125 MG tablet Commonly known as: COREG Take 1 tablet (3.125 mg total) by mouth 2 (two) times daily with a meal.   [facility] Vitamin B12 1000 MCG Tbcr Generic drug: Cyanocobalamin Take 1,000 mcg by mouth daily.   Entresto 24-26 MG Generic drug: sacubitril-valsartan Take 1 tablet by mouth daily.   fluticasone 50 MCG/ACT nasal spray Commonly known as: FLONASE Place 2 sprays into the nose daily as needed for allergies.   folic acid 1 MG tablet Commonly known as: FOLVITE Take 1 mg by mouth daily.   furosemide 20 MG tablet Commonly known as: LASIX Take 1 tablet (20 mg total) by mouth daily.   hydroxychloroquine 200 MG tablet Commonly known as: PLAQUENIL Take 200 mg by mouth daily.   isosorbide mononitrate 30 MG 24 hr tablet Commonly known as: IMDUR Take 30 mg by mouth daily.   meclizine 25 MG tablet Commonly known  as: ANTIVERT Take 25 mg by mouth 3 (three) times daily as needed for nausea or dizziness.   methotrexate 2.5 MG tablet Commonly known as: RHEUMATREX Take 15 mg by mouth every Monday.   midodrine 5 MG tablet Commonly known as: PROAMATINE Take 1 tablet (5 mg total) by mouth 3 (three) times daily with meals. What changed:  when to take this reasons to take this   montelukast 10 MG tablet Commonly known as: SINGULAIR Take 10 mg by mouth at bedtime.   nitroGLYCERIN 0.4 MG SL tablet Commonly known as: NITROSTAT Place 1 tablet (0.4  mg total) under the tongue every 5 (five) minutes as needed for chest pain.   polyethylene glycol 17 g packet Commonly known as: MIRALAX / GLYCOLAX Take 17 g by mouth daily as needed for moderate constipation.   predniSONE 5 MG tablet Commonly known as: DELTASONE Take 5 mg by mouth daily.   spironolactone 25 MG tablet Commonly known as: ALDACTONE Take 0.5 tablets (12.5 mg total) by mouth daily.        Discharge Exam: Filed Weights   11/26/22 [id number] 11/27/22 0436 11/28/22 [id number]  Weight: 79.9 kg 75.8 kg 74.7 kg   General: Not in acute distress HEENT: Eyes: PERRL, EOMI, no scleral icterus. ENT: No discharge from the ears and nose,        Neck: positive JVD, no bruit, no mass felt. Heme: No neck lymph node enlargement. Cardiac: S1/S2, RRR, No murmurs, No gallops or rubs. Respiratory: has fine crackles bilaterally GI: Soft, nondistended, nontender, no rebound pain, no organomegaly, BS present. GU: No hematuria Ext: 2+ pitting leg edema bilaterally. 1+DP/PT pulse bilaterally. Musculoskeletal: has finger deformities, No joint redness or warmth, no limitation of ROM in spin. Skin: No rashes.  Neuro: Alert, oriented X3, cranial nerves II-XII grossly intact   Condition at discharge: good Discharge time spent: greater than 30 minutes.  Signed: Verline Lema, MD [facility] 11/28/2022

## 2022-11-28 NOTE — TOC Transition Note (Signed)
Transition of Care [facility]) - CM/SW Discharge Note   Patient Details  Name: Tina Fields MRN: [id number] Date of Birth: December 03, 1945  [facility]) CM/SW Contact:  Candie Chroman, LCSW Phone Number: 11/28/2022, 1:13 PM   Clinical Narrative: Patient has orders to discharge home today. Readmission prevention screen complete. CSW called patient in the room, introduced role, and explained that discharge planning would be discussed. PCP is Rutherford Guys, MD. Her cousin drives her to appointments. [facility] [place] No issues obtaining medications. Patient's son lives with her. No home health prior to admission. She uses a RW to get around at home. Patient is on 1 L chronic oxygen ([employer]). Her friend will transport her home today and patient said she will not need oxygen for the ride home. RN is aware. No further concerns. CSW signing off.    Final next level of care: Home/Self Care Barriers to Discharge: No Barriers Identified   Patient Goals and CMS Choice      Discharge Placement                  Patient to be transferred to facility by: Friend   Patient and family notified of of transfer: 11/28/22  Discharge Plan and Services Additional resources added to the After Visit Summary for       Post Acute Care Choice: NA                               Social Determinants of Health ([place]) Interventions SDOH Screenings   Food Insecurity: No Food Insecurity (11/10/2022)  Housing: Low Risk  (11/10/2022)  Transportation Needs: No Transportation Needs (11/10/2022)  Utilities: Not At Risk (11/10/2022)  Financial Resource Strain: Unknown (08/21/2017)  Physical Activity: Unknown (08/21/2017)  Social Connections: Unknown (08/21/2017)  Stress: Unknown (08/21/2017)  Tobacco Use: Medium Risk (11/26/2022)     Readmission Risk Interventions    11/28/2022    1:11 PM 12/04/2021    2:04 PM  Readmission Risk Prevention Plan  Transportation Screening Complete  Complete  PCP or Specialist Appt within 5-7 Days  Complete  PCP or Specialist Appt within 3-5 Days Complete   Home Care Screening  Complete  Medication Review (RN CM)  Referral to Pharmacy  Social Work Consult for [place] Planning/Counseling [place] Not Applicable   Medication Review [employer]) Complete

## 2022-12-01 ENCOUNTER — Encounter: Payer: Self-pay | Admitting: Family

## 2022-12-01 ENCOUNTER — Ambulatory Visit: Payer: 59 | Attending: Family | Admitting: Family

## 2022-12-01 VITALS — BP 96/64 | HR 79 | Wt 167.2 lb

## 2022-12-01 DIAGNOSIS — I2510 Atherosclerotic heart disease of native coronary artery without angina pectoris: Secondary | ICD-10-CM | POA: Diagnosis not present

## 2022-12-01 DIAGNOSIS — Z7952 Long term (current) use of systemic steroids: Secondary | ICD-10-CM | POA: Diagnosis not present

## 2022-12-01 DIAGNOSIS — I10 Essential (primary) hypertension: Secondary | ICD-10-CM | POA: Diagnosis not present

## 2022-12-01 DIAGNOSIS — R5383 Other fatigue: Secondary | ICD-10-CM | POA: Diagnosis present

## 2022-12-01 DIAGNOSIS — M069 Rheumatoid arthritis, unspecified: Secondary | ICD-10-CM | POA: Diagnosis not present

## 2022-12-01 DIAGNOSIS — J45909 Unspecified asthma, uncomplicated: Secondary | ICD-10-CM | POA: Insufficient documentation

## 2022-12-01 DIAGNOSIS — Z87891 Personal history of nicotine dependence: Secondary | ICD-10-CM | POA: Insufficient documentation

## 2022-12-01 DIAGNOSIS — Z79899 Other long term (current) drug therapy: Secondary | ICD-10-CM | POA: Diagnosis not present

## 2022-12-01 DIAGNOSIS — I5020 Unspecified systolic (congestive) heart failure: Secondary | ICD-10-CM

## 2022-12-01 DIAGNOSIS — Z8616 Personal history of COVID-19: Secondary | ICD-10-CM | POA: Insufficient documentation

## 2022-12-01 DIAGNOSIS — K219 Gastro-esophageal reflux disease without esophagitis: Secondary | ICD-10-CM | POA: Insufficient documentation

## 2022-12-01 DIAGNOSIS — G8929 Other chronic pain: Secondary | ICD-10-CM | POA: Insufficient documentation

## 2022-12-01 DIAGNOSIS — I110 Hypertensive heart disease with heart failure: Secondary | ICD-10-CM | POA: Insufficient documentation

## 2022-12-01 DIAGNOSIS — R42 Dizziness and giddiness: Secondary | ICD-10-CM | POA: Diagnosis not present

## 2022-12-01 DIAGNOSIS — G4733 Obstructive sleep apnea (adult) (pediatric): Secondary | ICD-10-CM | POA: Insufficient documentation

## 2022-12-01 DIAGNOSIS — I5022 Chronic systolic (congestive) heart failure: Secondary | ICD-10-CM | POA: Diagnosis not present

## 2022-12-01 DIAGNOSIS — I2782 Chronic pulmonary embolism: Secondary | ICD-10-CM

## 2022-12-01 NOTE — Patient Instructions (Signed)
Take your blood pressure daily and if the top number of your blood pressure is <100, take the midodrine. Let me know if you need a prescription for this.

## 2022-12-01 NOTE — Progress Notes (Signed)
Patient ID: Tina Fields, female    DOB: May 25, 1946, 77 y.o.   MRN: [id number]  HPI  Ms Bota is a 77 y/o female with a history of asthma, CAD, HTN, aortic atherosclerosis, asthma, GERD, obstructive sleep apnea, RA, previous tobacco use and chronic heart failure.   Echo 11/11/22: EF 20-25% with mild LAE, mild MR. Echo 01/29/22: EF of 45-50% along with mild LVH, moderately elevated PA pressure and mild MR. Echo 05/03/21: EF of 45-50% with moderate LAE.   Admitted 11/26/22 due to SOB due to a/c heart failure. Admitted 11/10/22 due to chest pain and SOB due to a/c heart failure. IV diuresed. Myoview stress test done 3/8 -- Findings: Abnormal myocardial perfusion scan, Severely depressed left ventricular function less than 25%. Midodrine started   She presents today for a HF follow-up visit (hasn't been seen since 07/23) with a chief complaint of minimal fatigue with moderate exertion. Chronic in nature. Has SOB, cough, pedal edema (improving), intermittent dizziness & chronic pain along with this. Denies difficulty sleeping, abdominal distention, palpitations, chest pain or weight gain.   She thinks she may have midodrine at home but knows that she hasn't been taking it.  Past Medical History:  Diagnosis Date   Aortic atherosclerosis ([place])    Asthma    Chest pain 11/10/2022   CHF (congestive heart failure) (HCC)    Coronary artery disease    Dyspnea    Edema of both legs    GERD (gastroesophageal reflux disease)    HFrEF (heart failure with reduced ejection fraction) ([place])    History of 2019 novel coronavirus disease (COVID-19) 10/28/2020   Hypertension    Myocardial infarction (HCC)    OSA (obstructive sleep apnea)    noncompliant with nocturnal PAP therapy   RA (rheumatoid arthritis) ([place])    Past Surgical History:  Procedure Laterality Date   BREAST SURGERY     CARDIAC CATHETERIZATION Left 06/21/2005   Moderate ostial LCx and LAD disease; LVEF 45%; Location: [place]; Surgeon:  Neoma Laming, MD   CARDIAC CATHETERIZATION Left 05/20/2008   No occlusive CAD; LVEF 65%; Location: [facility]; Surgeon: Katrine Coho, MD   COLONOSCOPY     ESOPHAGOGASTRODUODENOSCOPY     TUBAL LIGATION     Family History  Problem Relation Age of Onset   CAD Mother    Diabetes Mother    Hypertension Mother    CAD Father    Diabetes Father    Hypertension Father    Stroke Father    Social History   Tobacco Use   Smoking status: Former    Types: Cigarettes    Quit date: 01/09/1995    Years since quitting: 27.9   Smokeless tobacco: Never  Substance Use Topics   Alcohol use: No   Allergies  Allergen Reactions   Ace Inhibitors Cough    Other reaction(s): Cough    Hydrochlorothiazide Other (See Comments)    Cramps   Latex Hives   Azithromycin Rash   Prior to Admission medications   Medication Sig Start Date End Date Taking? Authorizing Provider  acetaminophen (TYLENOL) 650 MG CR tablet Take 1,300 mg by mouth as needed for pain.   Yes [provider]  albuterol (VENTOLIN HFA) 108 (90 Base) MCG/ACT inhaler Inhale 1-2 puffs into the lungs every 6 (six) hours as needed for wheezing or shortness of breath.   Yes [provider]  alendronate (FOSAMAX) 70 MG tablet Take 70 mg by mouth once a week.   Yes [provider]  apixaban (ELIQUIS) 2.5 MG TABS tablet Take 1 tablet (2.5 mg total) by mouth 2 (two) times daily. 08/08/22  Yes Earlie Server, MD  atorvastatin (LIPITOR) 40 MG tablet Take 1 tablet (40 mg total) by mouth every morning. 12/05/21  Yes Kayleen Memos, DO  carvedilol (COREG) 3.125 MG tablet Take 1 tablet (3.125 mg total) by mouth 2 (two) times daily with a meal. 11/12/22  Yes Nicole Kindred A, DO  [facility] VITAMIN B12 1000 MCG TBCR Take 1,000 mcg by mouth daily.   Yes [provider]  fluticasone (FLONASE) 50 MCG/ACT nasal spray Place 2 sprays into the nose daily as needed for allergies.   Yes [provider]  folic acid (FOLVITE) 1 MG tablet  Take 1 mg by mouth daily.   Yes [provider]  furosemide (LASIX) 20 MG tablet Take 1 tablet (20 mg total) by mouth daily. 12/05/21  Yes Kayleen Memos, DO  hydroxychloroquine (PLAQUENIL) 200 MG tablet Take 200 mg by mouth daily.   Yes [provider]  isosorbide mononitrate (IMDUR) 30 MG 24 hr tablet Take 30 mg by mouth daily.   Yes [provider]  methotrexate (RHEUMATREX) 2.5 MG tablet Take 15 mg by mouth every Monday.   Yes [provider]  montelukast (SINGULAIR) 10 MG tablet Take 10 mg by mouth at bedtime.   Yes [provider]  polyethylene glycol (MIRALAX / GLYCOLAX) 17 g packet Take 17 g by mouth daily as needed for moderate constipation.   Yes [provider]  predniSONE (DELTASONE) 5 MG tablet Take 5 mg by mouth daily. 10/01/22  Yes [provider]  sacubitril-valsartan (ENTRESTO) 24-26 MG Take 1 tablet by mouth daily.   Yes [provider]  spironolactone (ALDACTONE) 25 MG tablet Take 0.5 tablets (12.5 mg total) by mouth daily. 11/13/22  Yes Nicole Kindred A, DO  albuterol (PROVENTIL) (2.5 MG/3ML) 0.083% nebulizer solution Take 2.5 mg by nebulization every 6 (six) hours as needed for wheezing or shortness of breath. Patient not taking: Reported on 12/01/2022    [provider]  midodrine (PROAMATINE) 5 MG tablet Take 1 tablet (5 mg total) by mouth 3 (three) times daily with meals. Patient not taking: Reported on 12/01/2022 11/12/22   Nicole Kindred A, DO  nitroGLYCERIN (NITROSTAT) 0.4 MG SL tablet Place 1 tablet (0.4 mg total) under the tongue every 5 (five) minutes as needed for chest pain. 11/12/22   Ezekiel Slocumb, DO    Review of Systems  Constitutional:  Positive for fatigue. Negative for appetite change.  HENT:  Negative for congestion, postnasal drip and sore throat.   Eyes: Negative.   Respiratory:  Positive for cough and shortness of breath. Negative for chest tightness.   Cardiovascular:   Positive for leg swelling. Negative for chest pain and palpitations.  Gastrointestinal:  Negative for abdominal distention and abdominal pain.  Endocrine: Negative.   Genitourinary: Negative.   Musculoskeletal:  Positive for arthralgias (hands/ knees due to RA) and neck pain. Negative for back pain.  Skin: Negative.   Allergic/Immunologic: Negative.   Neurological:  Positive for dizziness (at times). Negative for light-headedness.  Hematological:  Negative for adenopathy. Does not bruise/bleed easily.  Psychiatric/Behavioral:  Negative for dysphoric mood and sleep disturbance (sleeping on 1 pillow; wearing oxygen at bedtime). The patient is not nervous/anxious.    Vitals:   12/01/22 1320  BP: 96/64  Pulse: 79  SpO2: 95%  Weight: 167 lb 3.2 oz (75.8 kg)  Wt Readings from Last 3 Encounters:  12/01/22 167 lb 3.2 oz (75.8 kg)  11/28/22 164 lb 9.6 oz (74.7 kg)  11/12/22 176 lb 2.4 oz (79.9 kg)   Lab Results  Component Value Date   CREATININE 0.96 11/28/2022   CREATININE 0.97 11/27/2022   CREATININE 0.99 11/26/2022   Physical Exam Vitals and nursing note reviewed.  Constitutional:      Appearance: Normal appearance.  HENT:     Head: Normocephalic and atraumatic.  Cardiovascular:     Rate and Rhythm: Normal rate and regular rhythm.  Pulmonary:     Effort: Pulmonary effort is normal. No respiratory distress.     Breath sounds: No wheezing or rales.  Abdominal:     General: There is no distension.     Palpations: Abdomen is soft.  Musculoskeletal:        General: Deformity (knuckles on hands due to RA) present. No tenderness.     Cervical back: Normal range of motion and neck supple.     Right lower leg: Edema (trace pitting) present.     Left lower leg: No edema.  Skin:    General: Skin is warm and dry.  Neurological:     General: No focal deficit present.     Mental Status: She is alert and oriented to person, place, and time.  Psychiatric:        Mood and Affect: Mood  normal.        Behavior: Behavior normal.        Thought Content: Thought content normal.   Assessment & Plan:  1: Chronic heart failure with now reduced ejection fraction- - NYHA class II - euvolemic today - weighing daily; reminded to call for an overnight weight gain of > 2 pounds or a weekly weight gain of > 5 pounds - echo 11/11/22: EF 20-25% with mild LAE, mild MR. Echo 01/29/22: EF of 45-50% along with mild LVH, moderately elevated PA pressure and mild MR. Echo 05/03/21: EF of 45-50% with moderate LAE.  - not adding salt and does read food labels for sodium content - continue entresto 24/26mg  BID - continue carvedilol 3.125mg  BID - continue spironolactone 12.5mg  daily - current BP will not allow for titration - BNP 11/26/22 was 2966.8  2: HTN- - BP 96/64 - she thinks she has midodrine at home but hasn't been taking it; check BP once daily at home and if SBP is <100, she needs to take the midodrine - if she doesn't have midodrine at home, she will need to call us back for a new RX - sees PCP ([facility])  - BMP 11/28/22 reviewed and showed sodium 139, potassium 3.4, creatinine 0.96 & GFR >60  3: Obstructive sleep apnea- - does not wear CPAP - wearing oxygen but she doesn't know if it's at 1 or 2L  4: RA- - saw rheumatologist Posey Pronto) 11/16/22 - prednisone 5mg  daily  5: PE- - saw cardiology ([place]) 11/14/22 - saw hematology Tasia Catchings) 08/08/22 - continue apixaban 2.5mg  BID   Return in 2 months, sooner if needed.

## 2022-12-22 ENCOUNTER — Telehealth: Payer: Self-pay

## 2022-12-22 NOTE — Telephone Encounter (Signed)
Pt called regarding sodium and fluid advice.  Limiting fluid intake education given with teach back. Do the following things EVERYDAY: Weigh yourself in the morning before breakfast. Write it down and keep it in a log. Take your medicines as prescribed Eat low salt foods--Limit salt (sodium) to 2000 mg per day.  Stay as active as you can everyday Limit all fluids for the day to less than 2 liters  Pt aware, agreeable, and verbalized understanding.

## 2023-01-31 ENCOUNTER — Ambulatory Visit: Payer: 59 | Attending: Family | Admitting: Family

## 2023-01-31 ENCOUNTER — Encounter: Payer: Self-pay | Admitting: Family

## 2023-01-31 ENCOUNTER — Other Ambulatory Visit: Payer: Self-pay | Admitting: Oncology

## 2023-01-31 VITALS — BP 91/66 | HR 79 | Ht 66.0 in | Wt 171.0 lb

## 2023-01-31 DIAGNOSIS — I428 Other cardiomyopathies: Secondary | ICD-10-CM | POA: Diagnosis present

## 2023-01-31 DIAGNOSIS — I2510 Atherosclerotic heart disease of native coronary artery without angina pectoris: Secondary | ICD-10-CM | POA: Insufficient documentation

## 2023-01-31 DIAGNOSIS — Z7952 Long term (current) use of systemic steroids: Secondary | ICD-10-CM | POA: Diagnosis not present

## 2023-01-31 DIAGNOSIS — Z8616 Personal history of COVID-19: Secondary | ICD-10-CM | POA: Insufficient documentation

## 2023-01-31 DIAGNOSIS — Z86711 Personal history of pulmonary embolism: Secondary | ICD-10-CM | POA: Insufficient documentation

## 2023-01-31 DIAGNOSIS — I110 Hypertensive heart disease with heart failure: Secondary | ICD-10-CM | POA: Diagnosis not present

## 2023-01-31 DIAGNOSIS — I5020 Unspecified systolic (congestive) heart failure: Secondary | ICD-10-CM

## 2023-01-31 DIAGNOSIS — Z7901 Long term (current) use of anticoagulants: Secondary | ICD-10-CM | POA: Diagnosis not present

## 2023-01-31 DIAGNOSIS — G4733 Obstructive sleep apnea (adult) (pediatric): Secondary | ICD-10-CM | POA: Insufficient documentation

## 2023-01-31 DIAGNOSIS — I5022 Chronic systolic (congestive) heart failure: Secondary | ICD-10-CM | POA: Diagnosis not present

## 2023-01-31 DIAGNOSIS — Z87891 Personal history of nicotine dependence: Secondary | ICD-10-CM | POA: Insufficient documentation

## 2023-01-31 DIAGNOSIS — I10 Essential (primary) hypertension: Secondary | ICD-10-CM

## 2023-01-31 DIAGNOSIS — Z8249 Family history of ischemic heart disease and other diseases of the circulatory system: Secondary | ICD-10-CM | POA: Diagnosis not present

## 2023-01-31 DIAGNOSIS — Z79899 Other long term (current) drug therapy: Secondary | ICD-10-CM | POA: Diagnosis not present

## 2023-01-31 DIAGNOSIS — I2782 Chronic pulmonary embolism: Secondary | ICD-10-CM

## 2023-01-31 DIAGNOSIS — M069 Rheumatoid arthritis, unspecified: Secondary | ICD-10-CM | POA: Diagnosis not present

## 2023-01-31 NOTE — Progress Notes (Signed)
[facility] HEART FAILURE CLINIC - Pharmacist Note  Tina Fields is a 77 y.o. 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 Patient reports no medication access issues and no adverse events. She does ask why she is on medications that lower her blood pressure and a medication to increase her blood pressure. We discussed the role of GDMT in heart failure and that there may be some changes that can be made to her regimen to alleviate the need for midodrine. She is agreeable to any such changes. Patient reports occasional shortness of breath and leg swelling but denies these symptoms in clinic today. She reports that her appetite has increased since being on prednisone for her RA. She reports adherence to fluid restriction and that she has switched from drinking sodas to drinking water.  Recent ED Visit (past 6 months):  Date: 11/26/2022, CC: SOB Date: 11/10/2022, CC: chest pain  Guideline-Directed Medical Therapy/Evidence Based Medicine ACE/ARB/ARNI: Sacubitril/valsartan 24/26 mg twice daily Beta Blocker: Carvedilol 3.125 mg twice daily Aldosterone Antagonist: Spironolactone 12.5 mg daily Diuretic: Furosemide 20 mg daily SGLT2i:  none  Adherence Assessment Do you ever forget to take your medication? [] Yes [x] No  Do you ever skip doses due to side effects? [] Yes [x] No  Do you have trouble affording your medicines? [] Yes [x] No  Are you ever unable to pick up your medication due to transportation difficulties? [] Yes [x] No  Do you ever stop taking your medications because you don't believe they are helping? [] Yes [x] No  Do you check your weight daily? [x] Yes [] No  Adherence strategy: none reported Barriers to obtaining medications: none reported   Diagnostics ECHO: Date 11/11/2022, EF 20-25%, GHK, G1DD  Vitals    12/01/2022    1:20 PM 11/28/2022   12:04 PM 11/28/2022    8:22 AM  Vitals with BMI  Weight 167 lbs 3 oz  164 lbs 10 oz  BMI 27  26.58   Systolic 96 93   Diastolic 64 56   Pulse 79 72      Recent Labs    Latest Ref Rng & Units 11/28/2022    4:04 AM 11/27/2022    3:30 AM 11/26/2022    9:43 AM  BMP  Glucose 70 - 99 mg/dL 161  096  045   BUN 8 - 23 mg/dL 32  24  22   Creatinine 0.44 - 1.00 mg/dL 4.09  8.11  9.14   Sodium 135 - 145 mmol/L 139  140  140   Potassium 3.5 - 5.1 mmol/L 3.4  3.7  3.0   Chloride 98 - 111 mmol/L 104  105  109   CO2 22 - 32 mmol/L 27  25  24    Calcium 8.9 - 10.3 mg/dL 8.2  8.6  8.8     Past Medical History Past Medical History:  Diagnosis Date   Aortic atherosclerosis (HCC)    Asthma    Chest pain 11/10/2022   CHF (congestive heart failure) (HCC)    Coronary artery disease    Dyspnea    Edema of both legs    GERD (gastroesophageal reflux disease)    HFrEF (heart failure with reduced ejection fraction) (HCC)    History of 2019 novel coronavirus disease (COVID-19) 10/28/2020   Hypertension    Myocardial infarction (HCC)    OSA (obstructive sleep apnea)    noncompliant with nocturnal PAP therapy   RA (rheumatoid arthritis) (HCC)     Plan Continue regimen as directed by NP  Consider discontinuing Imdur in the setting of hypotension Annual echo due 11/2023  Time spent: 10 minutes  Celene Squibb, PharmD PGY1 Pharmacy Resident 01/31/2023 2:18 PM

## 2023-01-31 NOTE — Patient Instructions (Addendum)
Dr.Gramig in [place] is the hand specialist that Dr. Allena Katz recommends   Stop taking your isosorbide (imdur) and let's see if the blood pressure rises.

## 2023-01-31 NOTE — Progress Notes (Signed)
PCP: Tonia Ghent, MD (last seen earlier this year) Primary Cardiologist: Marcina Millard, MD (last seen 04/24; returns 07/24)  HPI:  Tina Fields is a 77 y/o female with a history of asthma, insignifcant CAD, HTN, aortic atherosclerosis, asthma, GERD, obstructive sleep apnea, RA, PE (05/23), hyperlipidemia, previous tobacco use and chronic heart failure.   Echo 11/11/22: EF 20-25% with mild LAE, mild MR. Echo 01/29/22: EF of 45-50% along with mild LVH, moderately elevated PA pressure and mild MR. Echo 05/03/21: EF of 45-50% with moderate LAE.   Lexiscan Myoview 11/10/2022 revealed anterolateral and inferolateral scar without ischemia in the absence of chest pain. Cardiac catheterization was deferred.   Admitted 11/26/22 due to SOB due to a/c heart failure. Admitted 11/10/22 due to chest pain and SOB due to a/c heart failure. IV diuresed. Myoview stress test done 3/8 -- Findings: Abnormal myocardial perfusion scan, Severely depressed left ventricular function less than 25%. Midodrine started   She presents today for a HF follow-up visit with a chief complaint of minimal SOB with moderate exertion. Chronic in nature. Has associated fatigue (improving), cough, intermittent dizziness, send of being off-balance and chronic difficulty sleeping along with this. Denies chest pain, palpitations or pedal edema. 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 Is hoping to see a hand specialist regarding this.   No longer drinking any soda and is purely drinking water. Tries to keep daily fluid intake to ~ 60 ounces.   ROS: All systems negative except as listed in HPI, PMH and Problem List.  SH:  Social History   Socioeconomic History   Marital status: Widowed    Spouse name: Not on file   Number of children: Not on file   Years of education: Not on file   Highest education level: Not on file  Occupational History   Not on file  Tobacco Use    Smoking status: Former    Types: Cigarettes    Quit date: 01/09/1995    Years since quitting: 28.0   Smokeless tobacco: Never  Vaping Use   Vaping Use: Never used  Substance and Sexual Activity   Alcohol use: No   Drug use: No   Sexual activity: Yes    Birth control/protection: None  Other Topics Concern   Not on file  Social History Narrative   Adult son   Social Determinants of Health   Financial Resource Strain: Unknown (08/21/2017)   Overall Financial Resource Strain (CARDIA)    Difficulty of Paying Living Expenses: Patient declined  Food Insecurity: No Food Insecurity (11/10/2022)   Hunger Vital Sign    Worried About Running Out of Food in the Last Year: Never true    Ran Out of Food in the Last Year: Never true  Transportation Needs: No Transportation Needs (11/10/2022)   PRAPARE - Administrator, Civil Service (Medical): No    Lack of Transportation (Non-Medical): No  Physical Activity: Unknown (08/21/2017)   Exercise Vital Sign    Days of Exercise per Week: Patient declined    Minutes of Exercise per Session: Patient declined  Stress: Unknown (08/21/2017)   [employer] of Occupational Health - Occupational Stress Questionnaire    Feeling of Stress : Patient declined  Social Connections: Unknown (08/21/2017)   Social Connection and Isolation Panel [NHANES]    Frequency of Communication with Friends and Family: Patient declined    Frequency of Social Gatherings with Friends and Family: Patient declined  Attends Religious Services: Patient declined    Active Member of Clubs or Organizations: Patient declined    Attends Banker Meetings: Patient declined    Marital Status: Patient declined  Intimate Partner Violence: Not At Risk (11/10/2022)   Humiliation, Afraid, Rape, and Kick questionnaire    Fear of Current or Ex-Partner: No    Emotionally Abused: No    Physically Abused: No    Sexually Abused: No    FH:  Family History  Problem  Relation Age of Onset   CAD Mother    Diabetes Mother    Hypertension Mother    CAD Father    Diabetes Father    Hypertension Father    Stroke Father     Past Medical History:  Diagnosis Date   Aortic atherosclerosis (HCC)    Asthma    Chest pain 11/10/2022   CHF (congestive heart failure) (HCC)    Coronary artery disease    Dyspnea    Edema of both legs    GERD (gastroesophageal reflux disease)    HFrEF (heart failure with reduced ejection fraction) (HCC)    History of 2019 novel coronavirus disease (COVID-19) 10/28/2020   Hypertension    Myocardial infarction (HCC)    OSA (obstructive sleep apnea)    noncompliant with nocturnal PAP therapy   RA (rheumatoid arthritis) (HCC)     Current Outpatient Medications  Medication Sig Dispense Refill   acetaminophen (TYLENOL) 650 MG CR tablet Take 1,300 mg by mouth as needed for pain.     albuterol (PROVENTIL) (2.5 MG/3ML) 0.083% nebulizer solution Take 2.5 mg by nebulization every 6 (six) hours as needed for wheezing or shortness of breath. (Patient not taking: Reported on 12/01/2022)     albuterol (VENTOLIN HFA) 108 (90 Base) MCG/ACT inhaler Inhale 1-2 puffs into the lungs every 6 (six) hours as needed for wheezing or shortness of breath.     alendronate (FOSAMAX) 70 MG tablet Take 70 mg by mouth once a week.     atorvastatin (LIPITOR) 40 MG tablet Take 1 tablet (40 mg total) by mouth every morning. 90 tablet 0   carvedilol (COREG) 3.125 MG tablet Take 1 tablet (3.125 mg total) by mouth 2 (two) times daily with a meal. 60 tablet 1   [employer] VITAMIN B12 1000 MCG TBCR Take 1,000 mcg by mouth daily.     ELIQUIS 2.5 MG TABS tablet TAKE 1 TABLET BY MOUTH TWICE A DAY 60 tablet 5   fluticasone (FLONASE) 50 MCG/ACT nasal spray Place 2 sprays into the nose daily as needed for allergies.     folic acid (FOLVITE) 1 MG tablet Take 1 mg by mouth daily.     furosemide (LASIX) 20 MG tablet Take 1 tablet (20 mg total) by mouth daily. 90 tablet 0    hydroxychloroquine (PLAQUENIL) 200 MG tablet Take 200 mg by mouth daily.     isosorbide mononitrate (IMDUR) 30 MG 24 hr tablet Take 30 mg by mouth daily.     methotrexate (RHEUMATREX) 2.5 MG tablet Take 15 mg by mouth every Monday.     midodrine (PROAMATINE) 5 MG tablet Take 1 tablet (5 mg total) by mouth 3 (three) times daily with meals. (Patient not taking: Reported on 12/01/2022) 90 tablet 1   montelukast (SINGULAIR) 10 MG tablet Take 10 mg by mouth at bedtime.     nitroGLYCERIN (NITROSTAT) 0.4 MG SL tablet Place 1 tablet (0.4 mg total) under the tongue every 5 (five) minutes as needed for chest  pain. 30 tablet 0   polyethylene glycol (MIRALAX / GLYCOLAX) 17 g packet Take 17 g by mouth daily as needed for moderate constipation.     predniSONE (DELTASONE) 5 MG tablet Take 5 mg by mouth daily.     sacubitril-valsartan (ENTRESTO) 24-26 MG Take 1 tablet by mouth daily.     spironolactone (ALDACTONE) 25 MG tablet Take 0.5 tablets (12.5 mg total) by mouth daily. 30 tablet 1   No current facility-administered medications for this visit.   Vitals:   01/31/23 1347  BP: 91/66  Pulse: 79  SpO2: 97%  Weight: 171 lb (77.6 kg)  Height: 5\' 6"  (1.676 m)   Wt Readings from Last 3 Encounters:  01/31/23 171 lb (77.6 kg)  12/01/22 167 lb 3.2 oz (75.8 kg)  11/28/22 164 lb 9.6 oz (74.7 kg)   Lab Results  Component Value Date   CREATININE 0.96 11/28/2022   CREATININE 0.97 11/27/2022   CREATININE 0.99 11/26/2022   PHYSICAL EXAM:  General:  Well appearing. No resp difficulty HEENT: normal Neck: supple. JVP flat. Carotids 2+ bilaterally; no bruits. No lymphadenopathy or thryomegaly appreciated. Cor: PMI normal. Regular rate & rhythm. No rubs, gallops or murmurs. Lungs: clear Abdomen: soft, nontender, nondistended. No hepatosplenomegaly. No bruits or masses. Good bowel sounds. Extremities: no cyanosis, clubbing, rash, edema Neuro: alert & orientedx3, cranial nerves grossly intact. Moves all 4  extremities w/o difficulty. Affect pleasant.   ECG: not done   ASSESSMENT & PLAN:  1: NICM with reduced ejection fraction- - likely due to HTN/ PE or untreated OSA - NYHA class II - euvolemic today - weighing daily; reminded to call for an overnight weight gain of > 2 pounds or a weekly weight gain of > 5 pounds - weight up 4 pounds from last visit here 2 months ago - echo 11/11/22: EF 20-25% with mild LAE, mild MR.  - Echo 01/29/22: EF of 45-50% along with mild LVH, moderately elevated PA pressure and mild MR.  - Echo 05/03/21: EF of 45-50% with moderate LAE - Lexiscan Myoview 11/10/2022 revealed anterolateral and inferolateral scar without ischemia in the absence of chest pain. Cardiac catheterization was deferred.  - not adding salt and does read food labels for sodium content - continue entresto 24/26mg  BID - continue carvedilol 3.125mg  BID - continue spironolactone 12.5mg  daily - stop isosorbide and if BP improves, will attempt to titrate GDMT and/ or add SGLT2 - once GDMT is as optimized as possible, will update echo - saw cardiology (Paraschos) 04/24 - BNP 11/26/22 was 2966.8 - PharmD reconciled meds w/ patient  2: HTN- - BP 91/66; stopping isosorbide per above - sees PCP ([facility])  - BMP 11/28/22 reviewed and showed sodium 139, potassium 3.4, creatinine 0.96 & GFR >60  3: Obstructive sleep apnea- - does not wear CPAP - wearing 2L oxygen at bedtime and PRN during the day - may need pulmonology f/u as she is asking about portable oxygen tank  4: RA- - saw rheumatologist Allena Katz) 03/24 - name of hand specialist given to her (per his note) - prednisone 5mg  daily  5: PE- - saw hematology Cathie Hoops) 12/23 - continue apixaban 2.5mg  BID - 2L oxygen at bedtime and PRN during the day  Return in 1 month, sooner if needed.

## 2023-02-07 ENCOUNTER — Encounter: Payer: Self-pay | Admitting: Oncology

## 2023-02-07 ENCOUNTER — Inpatient Hospital Stay: Payer: 59 | Attending: Oncology

## 2023-02-07 ENCOUNTER — Inpatient Hospital Stay (HOSPITAL_BASED_OUTPATIENT_CLINIC_OR_DEPARTMENT_OTHER): Payer: 59 | Admitting: Oncology

## 2023-02-07 VITALS — BP 115/67 | HR 72 | Temp 97.5°F | Resp 18 | Wt 172.2 lb

## 2023-02-07 DIAGNOSIS — Z87891 Personal history of nicotine dependence: Secondary | ICD-10-CM | POA: Insufficient documentation

## 2023-02-07 DIAGNOSIS — Z7901 Long term (current) use of anticoagulants: Secondary | ICD-10-CM | POA: Insufficient documentation

## 2023-02-07 DIAGNOSIS — Z86711 Personal history of pulmonary embolism: Secondary | ICD-10-CM | POA: Diagnosis present

## 2023-02-07 DIAGNOSIS — Z801 Family history of malignant neoplasm of trachea, bronchus and lung: Secondary | ICD-10-CM | POA: Diagnosis not present

## 2023-02-07 DIAGNOSIS — E876 Hypokalemia: Secondary | ICD-10-CM | POA: Diagnosis not present

## 2023-02-07 DIAGNOSIS — K449 Diaphragmatic hernia without obstruction or gangrene: Secondary | ICD-10-CM | POA: Insufficient documentation

## 2023-02-07 DIAGNOSIS — I252 Old myocardial infarction: Secondary | ICD-10-CM | POA: Diagnosis not present

## 2023-02-07 DIAGNOSIS — Z8701 Personal history of pneumonia (recurrent): Secondary | ICD-10-CM | POA: Insufficient documentation

## 2023-02-07 DIAGNOSIS — D649 Anemia, unspecified: Secondary | ICD-10-CM

## 2023-02-07 LAB — CBC WITH DIFFERENTIAL/PLATELET
Abs Immature Granulocytes: 0.01 10*3/uL (ref 0.00–0.07)
Basophils Absolute: 0 10*3/uL (ref 0.0–0.1)
Basophils Relative: 1 %
Eosinophils Absolute: 0 10*3/uL (ref 0.0–0.5)
Eosinophils Relative: 0 %
HCT: 40.9 % (ref 36.0–46.0)
Hemoglobin: 13.2 g/dL (ref 12.0–15.0)
Immature Granulocytes: 0 %
Lymphocytes Relative: 20 %
Lymphs Abs: 0.9 10*3/uL (ref 0.7–4.0)
MCH: 32.5 pg (ref 26.0–34.0)
MCHC: 32.3 g/dL (ref 30.0–36.0)
MCV: 100.7 fL — ABNORMAL HIGH (ref 80.0–100.0)
Monocytes Absolute: 0.4 10*3/uL (ref 0.1–1.0)
Monocytes Relative: 8 %
Neutro Abs: 3.1 10*3/uL (ref 1.7–7.7)
Neutrophils Relative %: 71 %
Platelets: 180 10*3/uL (ref 150–400)
RBC: 4.06 MIL/uL (ref 3.87–5.11)
RDW: 14.8 % (ref 11.5–15.5)
WBC: 4.4 10*3/uL (ref 4.0–10.5)
nRBC: 0 % (ref 0.0–0.2)

## 2023-02-07 LAB — COMPREHENSIVE METABOLIC PANEL
ALT: 16 U/L (ref 0–44)
AST: 16 U/L (ref 15–41)
Albumin: 3.2 g/dL — ABNORMAL LOW (ref 3.5–5.0)
Alkaline Phosphatase: 64 U/L (ref 38–126)
Anion gap: 9 (ref 5–15)
BUN: 24 mg/dL — ABNORMAL HIGH (ref 8–23)
CO2: 26 mmol/L (ref 22–32)
Calcium: 8.6 mg/dL — ABNORMAL LOW (ref 8.9–10.3)
Chloride: 106 mmol/L (ref 98–111)
Creatinine, Ser: 0.9 mg/dL (ref 0.44–1.00)
GFR, Estimated: >60 mL/min (ref >60)
Glucose, Bld: 123 mg/dL — ABNORMAL HIGH (ref 70–99)
Potassium: 3 mmol/L — ABNORMAL LOW (ref 3.5–5.1)
Sodium: 141 mmol/L (ref 135–145)
Total Bilirubin: 1.2 mg/dL (ref 0.3–1.2)
Total Protein: 6.9 g/dL (ref 6.5–8.1)

## 2023-02-07 MED ORDER — POTASSIUM CHLORIDE CRYS ER 10 MEQ PO TBCR: 10.0000 meq | EXTENDED_RELEASE_TABLET | Freq: Two times a day (BID) | ORAL | 0 refills | Status: DC

## 2023-02-07 MED ORDER — APIXABAN 2.5 MG PO TABS: 2.5000 mg | ORAL_TABLET | Freq: Two times a day (BID) | ORAL | 5 refills | Status: DC

## 2023-02-07 NOTE — Assessment & Plan Note (Addendum)
Unprovoked pulmonary embolism She has finished 6 months of therapeutic anticoagulation. hypercoagulable work up negative.  Continue Eliquis 2.5mg  BID.  Prescription was sent to pharmacy. Recommend age appropriate cancer screening.

## 2023-02-07 NOTE — Progress Notes (Signed)
Hematology/Oncology Progress note [phone] [phone]      Patient Care Team: Oswaldo Conroy, MD as PCP - General (Family Medicine)  ASSESSMENT & PLAN:   History of pulmonary embolism Unprovoked pulmonary embolism She has finished 6 months of therapeutic anticoagulation. hypercoagulable work up negative.  Continue Eliquis 2.5mg  BID.  Prescription was sent to pharmacy. Recommend age appropriate cancer screening.    Hypokalemia Likely due to diuretic use.  Will start patient on potassium chloride daily. I gave her 2 weeks supply.  I recommend patient to contact her cardiologist and primary care provider to repeat levels and decide whether she needs to continue long term potassium.     Orders Placed This Encounter  Procedures   CBC with Differential (Cancer Center Only)    Standing Status:   Future    Standing Expiration Date:   02/07/2024   CMP (Cancer Center only)    Standing Status:   Future    Standing Expiration Date:   02/07/2024   Vitamin B12    Standing Status:   Future    Standing Expiration Date:   02/07/2024   Folate    Standing Status:   Future    Standing Expiration Date:   02/07/2024   Follow up in 6 months. All questions were answered. The patient knows to call the clinic with any problems, questions or concerns. No barriers to learning was detected.  Rickard Patience, MD 02/07/2023   CHIEF COMPLAINTS/PURPOSE OF CONSULTATION:  Pulmonary Embolism  HISTORY OF PRESENTING ILLNESS:  Tina Fields 77 y.o. female is referred to establish care for pulmonary embolism.   01/28/22 - 02/01/22 patient presented for evaluation of SOB. She was found to be hypoxic.  Work up included CTa which was positive for acute pulmonary embolism,  CT angio chest 01/28/22: Positive for acute PE with CT evidence of right heart strain (RV/LV ratio: 1.06) consistent with submassive PE. Extensive bilateral ground glass opacities with underlying changes of  COPD. In the presence of interval mediastinal and bilateral hilar adenopathy, this is most likely infectious in origin with associated reactive adenopathy. Cardiomegaly. Previously demonstrated liver and bilateral renal cysts. Hiatal hernia. Patient denies any triggering immobilization factors. She was started on heparin gtt and transitioned to Elqiuis at discharge.  Venous Doppler did not show DVT in lower extremities.  She was also treated with community pneumonia.  Family history of lung cancer - paternal aunt.  Appetite is fair. She is accompanied by cousin.    INTERVAL HISTORY Tina Fields is a 77 y.o. female who has above history reviewed by me today presents for follow up visit for management of history of pulmonary embolism She feels well today. No new complaint.  She uses nasal cannula oxygen as needed.   MEDICAL HISTORY:  Past Medical History:  Diagnosis Date   Aortic atherosclerosis (HCC)    Asthma    Chest pain 11/10/2022   CHF (congestive heart failure) (HCC)    Coronary artery disease    Dyspnea    Edema of both legs    GERD (gastroesophageal reflux disease)    HFrEF (heart failure with reduced ejection fraction) (HCC)    History of 2019 novel coronavirus disease (COVID-19) 10/28/2020   Hypertension    Myocardial infarction (HCC)    OSA (obstructive sleep apnea)    noncompliant with nocturnal PAP therapy   RA (rheumatoid arthritis) (HCC)     SURGICAL HISTORY: Past Surgical History:  Procedure Laterality Date   BREAST SURGERY  CARDIAC CATHETERIZATION Left 06/21/2005   Moderate ostial LCx and LAD disease; LVEF 45%; Location: [facility]; Surgeon: Adrian Blackwater, MD   CARDIAC CATHETERIZATION Left 05/20/2008   No occlusive CAD; LVEF 65%; Location: [facility]; Surgeon: Rudean Hitt, MD   COLONOSCOPY     ESOPHAGOGASTRODUODENOSCOPY     TUBAL LIGATION      SOCIAL HISTORY: Social History   Socioeconomic History   Marital status: Widowed    Spouse name: Not on  file   Number of children: Not on file   Years of education: Not on file   Highest education level: Not on file  Occupational History   Not on file  Tobacco Use   Smoking status: Former    Types: Cigarettes    Quit date: 01/09/1995    Years since quitting: 28.0   Smokeless tobacco: Never  Vaping Use   Vaping Use: Never used  Substance and Sexual Activity   Alcohol use: No   Drug use: No   Sexual activity: Yes    Birth control/protection: None  Other Topics Concern   Not on file  Social History Narrative   Adult son   Social Determinants of Health   Financial Resource Strain: Unknown (08/21/2017)   Overall Financial Resource Strain (CARDIA)    Difficulty of Paying Living Expenses: Patient declined  Food Insecurity: No Food Insecurity (11/10/2022)   Hunger Vital Sign    Worried About Running Out of Food in the Last Year: Never true    Ran Out of Food in the Last Year: Never true  Transportation Needs: No Transportation Needs (11/10/2022)   PRAPARE - Administrator, Civil Service (Medical): No    Lack of Transportation (Non-Medical): No  Physical Activity: Unknown (08/21/2017)   Exercise Vital Sign    Days of Exercise per Week: Patient declined    Minutes of Exercise per Session: Patient declined  Stress: Unknown (08/21/2017)   [employer] of Occupational Health - Occupational Stress Questionnaire    Feeling of Stress : Patient declined  Social Connections: Unknown (08/21/2017)   Social Connection and Isolation Panel [NHANES]    Frequency of Communication with Friends and Family: Patient declined    Frequency of Social Gatherings with Friends and Family: Patient declined    Attends Religious Services: Patient declined    Database administrator or Organizations: Patient declined    Attends Banker Meetings: Patient declined    Marital Status: Patient declined  Intimate Partner Violence: Not At Risk (11/10/2022)   Humiliation, Afraid, Rape, and  Kick questionnaire    Fear of Current or Ex-Partner: No    Emotionally Abused: No    Physically Abused: No    Sexually Abused: No    FAMILY HISTORY: Family History  Problem Relation Age of Onset   CAD Mother    Diabetes Mother    Hypertension Mother    CAD Father    Diabetes Father    Hypertension Father    Stroke Father     ALLERGIES:  is allergic to ace inhibitors, hydrochlorothiazide, latex, and azithromycin.  MEDICATIONS:  Current Outpatient Medications  Medication Sig Dispense Refill   acetaminophen (TYLENOL) 650 MG CR tablet Take 1,300 mg by mouth as needed for pain.     albuterol (VENTOLIN HFA) 108 (90 Base) MCG/ACT inhaler Inhale 1-2 puffs into the lungs every 6 (six) hours as needed for wheezing or shortness of breath.     alendronate (FOSAMAX) 70 MG tablet Take 70 mg by mouth once  a week. Thursday     atorvastatin (LIPITOR) 40 MG tablet Take 1 tablet (40 mg total) by mouth every morning. 90 tablet 0   carvedilol (COREG) 3.125 MG tablet Take 1 tablet (3.125 mg total) by mouth 2 (two) times daily with a meal. 60 tablet 1   [employer] VITAMIN B12 1000 MCG TBCR Take 1,000 mcg by mouth daily.     fluticasone (FLONASE) 50 MCG/ACT nasal spray Place 2 sprays into the nose daily as needed for allergies.     folic acid (FOLVITE) 1 MG tablet Take 1 mg by mouth daily.     furosemide (LASIX) 20 MG tablet Take 1 tablet (20 mg total) by mouth daily. 90 tablet 0   hydroxychloroquine (PLAQUENIL) 200 MG tablet Take 200 mg by mouth daily.     isosorbide mononitrate (IMDUR) 30 MG 24 hr tablet Take 30 mg by mouth daily.     methotrexate (RHEUMATREX) 2.5 MG tablet Take 15 mg by mouth every Monday.     montelukast (SINGULAIR) 10 MG tablet Take 10 mg by mouth at bedtime.     potassium chloride (KLOR-CON M) 10 MEQ tablet Take 1 tablet (10 mEq total) by mouth 2 (two) times daily. 14 tablet 0   predniSONE (DELTASONE) 5 MG tablet Take 5 mg by mouth daily.     sacubitril-valsartan (ENTRESTO) 24-26 MG  Take 1 tablet by mouth daily.     spironolactone (ALDACTONE) 25 MG tablet Take 0.5 tablets (12.5 mg total) by mouth daily. 30 tablet 1   apixaban (ELIQUIS) 2.5 MG TABS tablet Take 1 tablet (2.5 mg total) by mouth 2 (two) times daily. 60 tablet 5   midodrine (PROAMATINE) 5 MG tablet Take 1 tablet (5 mg total) by mouth 3 (three) times daily with meals. (Patient not taking: Reported on 01/31/2023) 90 tablet 1   nitroGLYCERIN (NITROSTAT) 0.4 MG SL tablet Place 1 tablet (0.4 mg total) under the tongue every 5 (five) minutes as needed for chest pain. (Patient not taking: Reported on 02/07/2023) 30 tablet 0   No current facility-administered medications for this visit.    Review of Systems  Constitutional:  Positive for fatigue. Negative for appetite change, chills and fever.  HENT:   Negative for hearing loss and voice change.   Eyes:  Negative for eye problems.  Respiratory:  Negative for chest tightness, cough and shortness of breath.   Cardiovascular:  Negative for chest pain.  Gastrointestinal:  Negative for abdominal distention, abdominal pain and blood in stool.  Endocrine: Negative for hot flashes.  Genitourinary:  Negative for difficulty urinating and frequency.   Musculoskeletal:  Negative for arthralgias.  Skin:  Negative for itching and rash.  Neurological:  Negative for extremity weakness.  Hematological:  Negative for adenopathy.  Psychiatric/Behavioral:  Negative for confusion.      PHYSICAL EXAMINATION: Vitals:   02/07/23 1442  BP: 115/67  Pulse: 72  Resp: 18  Temp: (!) 97.5 F (36.4 C)   Filed Weights   02/07/23 1442  Weight: 172 lb 3.2 oz (78.1 kg)    Physical Exam Constitutional:      General: She is not in acute distress.    Appearance: She is not diaphoretic.     Comments: Patient sits in the wheelchair.   HENT:     Head: Normocephalic and atraumatic.     Nose: Nose normal.     Mouth/Throat:     Pharynx: No oropharyngeal exudate.  Eyes:     General: No  scleral icterus.  Pupils: Pupils are equal, round, and reactive to light.  Cardiovascular:     Rate and Rhythm: Normal rate and regular rhythm.     Heart sounds: No murmur heard. Pulmonary:     Effort: Pulmonary effort is normal. No respiratory distress.     Comments: She breathes comfortably on room air Abdominal:     Palpations: Abdomen is soft.  Musculoskeletal:        General: Normal range of motion.     Cervical back: Normal range of motion and neck supple.  Skin:    General: Skin is warm and dry.     Findings: No erythema.  Neurological:     Mental Status: She is alert and oriented to person, place, and time. Mental status is at baseline.     Cranial Nerves: No cranial nerve deficit.     Motor: No abnormal muscle tone.     Coordination: Coordination normal.  Psychiatric:        Mood and Affect: Mood and affect normal.      LABORATORY DATA:  I have reviewed the data as listed Lab Results  Component Value Date   WBC 4.4 02/07/2023   HGB 13.2 02/07/2023   HCT 40.9 02/07/2023   MCV 100.7 (H) 02/07/2023   PLT 180 02/07/2023   Recent Labs    03/20/22 1357 04/24/22 1257 08/08/22 1410 11/09/22 2352 11/26/22 0943 11/27/22 0330 11/28/22 0404 02/07/23 1428  NA 141   < > 139   < > 140 140 139 141  K 3.3*   < > 3.5   < > 3.0* 3.7 3.4* 3.0*  CL 109   < > 104   < > 109 105 104 106  CO2 26   < > 25   < > 24 25 27 26   GLUCOSE 89   < > 124*   < > 121* 111* 112* 123*  BUN 20   < > 18   < > 22 24* 32* 24*  CREATININE 0.77   < > 0.79   < > 0.99 0.97 0.96 0.90  CALCIUM 8.6*   < > 8.6*   < > 8.8* 8.6* 8.2* 8.6*  GFRNONAA >60   < > >60   < > 58* 59* >60 >60  PROT 7.4   < > 7.0  --  7.0  --   --  6.9  ALBUMIN 2.8*   < > 3.0*  --  3.2*  --   --  3.2*  AST 17   < > 16  --  17  --   --  16  ALT 11   < > 9  --  10  --   --  16  ALKPHOS 63   < > 65  --  65  --   --  64  BILITOT 0.8   < > 0.6  --  0.8  --   --  1.2  BILIDIR 0.1  --   --   --   --   --   --   --   IBILI 0.7  --    --   --   --   --   --   --    < > = values in this interval not displayed.     RADIOGRAPHIC STUDIES: I have personally reviewed the radiological images as listed and agreed with the findings in the report. No results found.

## 2023-02-07 NOTE — Assessment & Plan Note (Signed)
Likely due to diuretic use.  Will start patient on potassium chloride daily. I gave her 2 weeks supply.  I recommend patient to contact her cardiologist and primary care provider to repeat levels and decide whether she needs to continue long term potassium.

## 2023-02-07 NOTE — Progress Notes (Signed)
Pt here for follow up. No new concerns voiced.   

## 2023-02-08 LAB — VITAMIN B12: Vitamin B-12: 2516 pg/mL — ABNORMAL HIGH (ref 180–914)

## 2023-03-05 ENCOUNTER — Encounter: Payer: Self-pay | Admitting: Family

## 2023-03-05 ENCOUNTER — Ambulatory Visit: Payer: 59 | Attending: Family | Admitting: Family

## 2023-03-05 VITALS — BP 112/58 | HR 69 | Ht 66.0 in | Wt 169.0 lb

## 2023-03-05 DIAGNOSIS — E785 Hyperlipidemia, unspecified: Secondary | ICD-10-CM | POA: Diagnosis not present

## 2023-03-05 DIAGNOSIS — Z87891 Personal history of nicotine dependence: Secondary | ICD-10-CM | POA: Diagnosis not present

## 2023-03-05 DIAGNOSIS — Z8249 Family history of ischemic heart disease and other diseases of the circulatory system: Secondary | ICD-10-CM | POA: Insufficient documentation

## 2023-03-05 DIAGNOSIS — I2510 Atherosclerotic heart disease of native coronary artery without angina pectoris: Secondary | ICD-10-CM | POA: Diagnosis not present

## 2023-03-05 DIAGNOSIS — I110 Hypertensive heart disease with heart failure: Secondary | ICD-10-CM | POA: Diagnosis not present

## 2023-03-05 DIAGNOSIS — Z7901 Long term (current) use of anticoagulants: Secondary | ICD-10-CM | POA: Insufficient documentation

## 2023-03-05 DIAGNOSIS — I5020 Unspecified systolic (congestive) heart failure: Secondary | ICD-10-CM | POA: Diagnosis not present

## 2023-03-05 DIAGNOSIS — G4733 Obstructive sleep apnea (adult) (pediatric): Secondary | ICD-10-CM | POA: Diagnosis not present

## 2023-03-05 DIAGNOSIS — Z79899 Other long term (current) drug therapy: Secondary | ICD-10-CM | POA: Diagnosis not present

## 2023-03-05 DIAGNOSIS — I2782 Chronic pulmonary embolism: Secondary | ICD-10-CM

## 2023-03-05 DIAGNOSIS — K219 Gastro-esophageal reflux disease without esophagitis: Secondary | ICD-10-CM | POA: Diagnosis not present

## 2023-03-05 DIAGNOSIS — I5022 Chronic systolic (congestive) heart failure: Secondary | ICD-10-CM | POA: Insufficient documentation

## 2023-03-05 DIAGNOSIS — M069 Rheumatoid arthritis, unspecified: Secondary | ICD-10-CM | POA: Insufficient documentation

## 2023-03-05 DIAGNOSIS — I428 Other cardiomyopathies: Secondary | ICD-10-CM | POA: Diagnosis present

## 2023-03-05 DIAGNOSIS — I10 Essential (primary) hypertension: Secondary | ICD-10-CM

## 2023-03-05 DIAGNOSIS — J45909 Unspecified asthma, uncomplicated: Secondary | ICD-10-CM | POA: Insufficient documentation

## 2023-03-05 MED ORDER — EMPAGLIFLOZIN 10 MG PO TABS: 10.0000 mg | ORAL_TABLET | Freq: Every day | ORAL | 6 refills | Status: DC

## 2023-03-05 NOTE — Progress Notes (Signed)
PCP: Tonia Ghent, MD (last seen earlier this year) Primary Cardiologist: Marcina Millard, MD (last seen 04/24; returns 07/24)  HPI:  Tina Fields is a 77 y/o female with a history of asthma, insignifcant CAD, HTN, aortic atherosclerosis, asthma, GERD, obstructive sleep apnea, RA, PE (05/23), hyperlipidemia, previous tobacco use and chronic heart failure.   Echo 11/11/22: EF 20-25% with mild LAE, mild MR. Echo 01/29/22: EF of 45-50% along with mild LVH, moderately elevated PA pressure and mild MR. Echo 05/03/21: EF of 45-50% with moderate LAE.   Lexiscan Myoview 11/10/2022 revealed anterolateral and inferolateral scar without ischemia in the absence of chest pain. Cardiac catheterization was deferred.   Admitted 11/26/22 due to SOB due to a/c heart failure. Admitted 11/10/22 due to chest pain and SOB due to a/c heart failure. IV diuresed. Myoview stress test done 3/8 -- Findings: Abnormal myocardial perfusion scan, Severely depressed left ventricular function less than 25%. Midodrine started   She presents today for a HF follow-up visit with a chief complaint of minimal SOB with moderate exertion. Chronic in nature. Has associated fatigue (improving), intermittent dizziness and minimal pedal edema along with this. Denies cough, chest pain, palpitations, difficulty sleeping or weight gain. Overall she says that she is feeling much better.   She asks for a referral to pulmonology to see if she can get portable oxygen. She says that she used to see Dr Meredeth Ide but that it's been > 5 years since she was seen   Having BMP [facility] at [facility]. Has been started on potassium since last here.   At last visit, isosorbide was stopped due to hypotension.   ROS: All systems negative except as listed in HPI, PMH and Problem List.  SH:  Social History   Socioeconomic History   Marital status: Widowed    Spouse name: Not on file   Number of children: Not on file   Years of education: Not on file    Highest education level: Not on file  Occupational History   Not on file  Tobacco Use   Smoking status: Former    Types: Cigarettes    Quit date: 01/09/1995    Years since quitting: 28.1   Smokeless tobacco: Never  Vaping Use   Vaping Use: Never used  Substance and Sexual Activity   Alcohol use: No   Drug use: No   Sexual activity: Yes    Birth control/protection: None  Other Topics Concern   Not on file  Social History Narrative   Adult son   Social Determinants of Health   Financial Resource Strain: Unknown (08/21/2017)   Overall Financial Resource Strain (CARDIA)    Difficulty of Paying Living Expenses: Patient declined  Food Insecurity: No Food Insecurity (11/10/2022)   Hunger Vital Sign    Worried About Running Out of Food in the Last Year: Never true    Ran Out of Food in the Last Year: Never true  Transportation Needs: No Transportation Needs (11/10/2022)   PRAPARE - Administrator, Civil Service (Medical): No    Lack of Transportation (Non-Medical): No  Physical Activity: Unknown (08/21/2017)   Exercise Vital Sign    Days of Exercise per Week: Patient declined    Minutes of Exercise per Session: Patient declined  Stress: Unknown (08/21/2017)   [employer] of Occupational Health - Occupational Stress Questionnaire    Feeling of Stress : Patient declined  Social Connections: Unknown (08/21/2017)   Social Connection and Isolation Panel [NHANES]  Frequency of Communication with Friends and Family: Patient declined    Frequency of Social Gatherings with Friends and Family: Patient declined    Attends Religious Services: Patient declined    Database administrator or Organizations: Patient declined    Attends Banker Meetings: Patient declined    Marital Status: Patient declined  Intimate Partner Violence: Not At Risk (11/10/2022)   Humiliation, Afraid, Rape, and Kick questionnaire    Fear of Current or Ex-Partner: No    Emotionally Abused:  No    Physically Abused: No    Sexually Abused: No    FH:  Family History  Problem Relation Age of Onset   CAD Mother    Diabetes Mother    Hypertension Mother    CAD Father    Diabetes Father    Hypertension Father    Stroke Father     Past Medical History:  Diagnosis Date   Aortic atherosclerosis (HCC)    Asthma    Chest pain 11/10/2022   CHF (congestive heart failure) (HCC)    Coronary artery disease    Dyspnea    Edema of both legs    GERD (gastroesophageal reflux disease)    HFrEF (heart failure with reduced ejection fraction) (HCC)    History of 2019 novel coronavirus disease (COVID-19) 10/28/2020   Hypertension    Myocardial infarction (HCC)    OSA (obstructive sleep apnea)    noncompliant with nocturnal PAP therapy   RA (rheumatoid arthritis) (HCC)     Current Outpatient Medications  Medication Sig Dispense Refill   acetaminophen (TYLENOL) 650 MG CR tablet Take 1,300 mg by mouth as needed for pain.     albuterol (VENTOLIN HFA) 108 (90 Base) MCG/ACT inhaler Inhale 1-2 puffs into the lungs every 6 (six) hours as needed for wheezing or shortness of breath.     alendronate (FOSAMAX) 70 MG tablet Take 70 mg by mouth once a week. Thursday     apixaban (ELIQUIS) 2.5 MG TABS tablet Take 1 tablet (2.5 mg total) by mouth 2 (two) times daily. 60 tablet 5   atorvastatin (LIPITOR) 40 MG tablet Take 1 tablet (40 mg total) by mouth every morning. 90 tablet 0   carvedilol (COREG) 3.125 MG tablet Take 1 tablet (3.125 mg total) by mouth 2 (two) times daily with a meal. 60 tablet 1   [facility]S VITAMIN B12 1000 MCG TBCR Take 1,000 mcg by mouth daily.     fluticasone (FLONASE) 50 MCG/ACT nasal spray Place 2 sprays into the nose daily as needed for allergies.     folic acid (FOLVITE) 1 MG tablet Take 1 mg by mouth daily.     furosemide (LASIX) 20 MG tablet Take 1 tablet (20 mg total) by mouth daily. 90 tablet 0   hydroxychloroquine (PLAQUENIL) 200 MG tablet Take 200 mg by mouth daily.      methotrexate (RHEUMATREX) 2.5 MG tablet Take 15 mg by mouth every Monday.     montelukast (SINGULAIR) 10 MG tablet Take 10 mg by mouth at bedtime.     potassium chloride (KLOR-CON M) 10 MEQ tablet Take 1 tablet (10 mEq total) by mouth 2 (two) times daily. (Patient taking differently: Take 10 mEq by mouth 2 (two) times daily. Pt reports taking once daily) 14 tablet 0   sacubitril-valsartan (ENTRESTO) 24-26 MG Take 1 tablet by mouth daily.     spironolactone (ALDACTONE) 25 MG tablet Take 0.5 tablets (12.5 mg total) by mouth daily. 30 tablet 1  isosorbide mononitrate (IMDUR) 30 MG 24 hr tablet Take 30 mg by mouth daily. (Patient not taking: Reported on 03/05/2023)     midodrine (PROAMATINE) 5 MG tablet Take 1 tablet (5 mg total) by mouth 3 (three) times daily with meals. (Patient not taking: Reported on 01/31/2023) 90 tablet 1   nitroGLYCERIN (NITROSTAT) 0.4 MG SL tablet Place 1 tablet (0.4 mg total) under the tongue every 5 (five) minutes as needed for chest pain. (Patient not taking: Reported on 02/07/2023) 30 tablet 0   predniSONE (DELTASONE) 5 MG tablet Take 5 mg by mouth daily. (Patient not taking: Reported on 03/05/2023)     No current facility-administered medications for this visit.   Vitals:   03/05/23 1329  BP: (!) 112/58  Pulse: 69  SpO2: 97%  Weight: 169 lb (76.7 kg)  Height: 5\' 6"  (1.676 m)   Wt Readings from Last 3 Encounters:  03/05/23 169 lb (76.7 kg)  02/07/23 172 lb 3.2 oz (78.1 kg)  01/31/23 171 lb (77.6 kg)   Lab Results  Component Value Date   CREATININE 0.90 02/07/2023   CREATININE 0.96 11/28/2022   CREATININE 0.97 11/27/2022   PHYSICAL EXAM:  General:  Well appearing. No resp difficulty HEENT: normal Neck: supple. JVP flat. No lymphadenopathy or thryomegaly appreciated. Cor: PMI normal. Regular rate & rhythm. No rubs, gallops or murmurs. Lungs: clear Abdomen: soft, nontender, nondistended. No hepatosplenomegaly. No bruits or masses.  Extremities: no cyanosis,  clubbing, rash, trace pedal edema bilaterally; fingers deformed due to RA Neuro: alert & oriented x3, cranial nerves grossly intact. Moves all 4 extremities w/o difficulty. Affect pleasant.   ECG: not done   ASSESSMENT & PLAN:  1: NICM with reduced ejection fraction- - likely due to HTN/ PE or untreated OSA - NYHA class II - euvolemic today - weighing daily; reminded to call for an overnight weight gain of > 2 pounds or a weekly weight gain of > 5 pounds - weight 171 pounds from last visit here 6 weeks ago - echo 11/11/22: EF 20-25% with mild LAE, mild MR.  - Echo 01/29/22: EF of 45-50% along with mild LVH, moderately elevated PA pressure and mild MR.  - Echo 05/03/21: EF of 45-50% with moderate LAE - Lexiscan Myoview 11/10/2022 revealed anterolateral and inferolateral scar without ischemia in the absence of chest pain. Cardiac catheterization was deferred.  - not adding salt and does read food labels for sodium content - continue entresto 24/26mg  BID - continue carvedilol 3.125mg  BID - continue spironolactone 12.5mg  daily - continue potassium daily - continue furosemide 20mg  daily - begin jardiance 10mg  daily; 30 day voucher provided - BMP next visit - once GDMT is as optimized as possible, will update echo - saw cardiology (Paraschos) 04/24 - BNP 11/26/22 was 2966.8  2: HTN- - BP 12/58 - sees PCP ([facility])  - BMP 02/07/23 reviewed and showed sodium 141, potassium 3.0, creatinine 0.9 & GFR >60 - getting BMP drawn at [facility] later today  3: Obstructive sleep apnea- - does not wear CPAP - wearing 2L oxygen at bedtime and PRN during the day - referral made to [facility] pulmonology per her request  4: RA- - saw rheumatologist Allena Katz) 03/24 - is hoping to see the hand specialist to see if he can decrease progress of her RA  5: PE- - saw hematology Cathie Hoops) 06/24 - continue apixaban 2.5mg  BID - 2L oxygen at bedtime and PRN during the day  Return in 1 month,  sooner  if needed.

## 2023-03-22 ENCOUNTER — Telehealth: Payer: Self-pay

## 2023-04-12 ENCOUNTER — Encounter: Payer: 59 | Admitting: Family

## 2023-04-12 NOTE — Progress Notes (Deleted)
PCP: Tonia Ghent, MD (last seen earlier this year) Primary Cardiologist: Marcina Millard, MD (last seen 07/24)  HPI:  Tina Fields is a 77 y/o female with a history of asthma, insignifcant CAD, HTN, aortic atherosclerosis, asthma, GERD, obstructive sleep apnea, RA, PE (05/23), hyperlipidemia, previous tobacco use and chronic heart failure.   Admitted 11/10/22 due to chest pain and SOB due to a/c heart failure. IV diuresed. Myoview stress test done 3/8 -- Findings: Abnormal myocardial perfusion scan, Severely depressed left ventricular function less than 25%. Midodrine started. Admitted 11/26/22 due to SOB due to a/c heart failure.  Echo 05/03/21: EF of 45-50% with moderate LAE.  Echo 01/29/22: EF of 45-50% along with mild LVH, moderately elevated PA pressure and mild MR. Echo 11/11/22: EF 20-25% with mild LAE, mild MR.    Lexiscan Myoview 11/10/2022 revealed anterolateral and inferolateral scar without ischemia in the absence of chest pain. Cardiac catheterization was deferred.   She presents today for a HF follow-up visit with a chief complaint of   At last visit, jardiance 10mg  was started.   ROS: All systems negative except as listed in HPI, PMH and Problem List.  SH:  Social History   Socioeconomic History   Marital status: Widowed    Spouse name: Not on file   Number of children: Not on file   Years of education: Not on file   Highest education level: Not on file  Occupational History   Not on file  Tobacco Use   Smoking status: Former    Current packs/day: 0.00    Types: Cigarettes    Quit date: 01/09/1995    Years since quitting: 28.2   Smokeless tobacco: Never  Vaping Use   Vaping status: Never Used  Substance and Sexual Activity   Alcohol use: No   Drug use: No   Sexual activity: Yes    Birth control/protection: None  Other Topics Concern   Not on file  Social History Narrative   Adult son   Social Determinants of Health   Financial Resource Strain: Medium Risk  (10/29/2020)   Received from [facility], [facility]   Overall Financial Resource Strain (CARDIA)    Difficulty of Paying Living Expenses: Somewhat hard  Food Insecurity: No Food Insecurity (11/10/2022)   Hunger Vital Sign    Worried About Running Out of Food in the Last Year: Never true    Ran Out of Food in the Last Year: Never true  Transportation Needs: No Transportation Needs (11/10/2022)   PRAPARE - Administrator, Civil Service (Medical): No    Lack of Transportation (Non-Medical): No  Physical Activity: Unknown (08/21/2017)   Exercise Vital Sign    Days of Exercise per Week: Patient declined    Minutes of Exercise per Session: Patient declined  Stress: Unknown (08/21/2017)   [employer] of Occupational Health - Occupational Stress Questionnaire    Feeling of Stress : Patient declined  Social Connections: Unknown (08/21/2017)   Social Connection and Isolation Panel [NHANES]    Frequency of Communication with Friends and Family: Patient declined    Frequency of Social Gatherings with Friends and Family: Patient declined    Attends Religious Services: Patient declined    Active Member of Clubs or Organizations: Patient declined    Attends Banker Meetings: Patient declined    Marital Status: Patient declined  Intimate Partner Violence: Not At Risk (11/10/2022)   Humiliation, Afraid, Rape, and Kick questionnaire    Fear of  Current or Ex-Partner: No    Emotionally Abused: No    Physically Abused: No    Sexually Abused: No    FH:  Family History  Problem Relation Age of Onset   CAD Mother    Diabetes Mother    Hypertension Mother    CAD Father    Diabetes Father    Hypertension Father    Stroke Father     Past Medical History:  Diagnosis Date   Aortic atherosclerosis (HCC)    Asthma    Chest pain 11/10/2022   CHF (congestive heart failure) (HCC)    Coronary artery disease    Dyspnea    Edema of both legs    GERD  (gastroesophageal reflux disease)    HFrEF (heart failure with reduced ejection fraction) (HCC)    History of 2019 novel coronavirus disease (COVID-19) 10/28/2020   Hypertension    Myocardial infarction (HCC)    OSA (obstructive sleep apnea)    noncompliant with nocturnal PAP therapy   RA (rheumatoid arthritis) (HCC)     Current Outpatient Medications  Medication Sig Dispense Refill   acetaminophen (TYLENOL) 650 MG CR tablet Take 1,300 mg by mouth as needed for pain.     albuterol (VENTOLIN HFA) 108 (90 Base) MCG/ACT inhaler Inhale 1-2 puffs into the lungs every 6 (six) hours as needed for wheezing or shortness of breath.     alendronate (FOSAMAX) 70 MG tablet Take 70 mg by mouth once a week. Thursday     apixaban (ELIQUIS) 2.5 MG TABS tablet Take 1 tablet (2.5 mg total) by mouth 2 (two) times daily. 60 tablet 5   atorvastatin (LIPITOR) 40 MG tablet Take 1 tablet (40 mg total) by mouth every morning. 90 tablet 0   carvedilol (COREG) 3.125 MG tablet Take 1 tablet (3.125 mg total) by mouth 2 (two) times daily with a meal. 60 tablet 1   [facility] VITAMIN B12 1000 MCG TBCR Take 1,000 mcg by mouth daily.     empagliflozin (JARDIANCE) 10 MG TABS tablet Take 1 tablet (10 mg total) by mouth daily before breakfast. 30 tablet 6   fluticasone (FLONASE) 50 MCG/ACT nasal spray Place 2 sprays into the nose daily as needed for allergies.     folic acid (FOLVITE) 1 MG tablet Take 1 mg by mouth daily.     furosemide (LASIX) 20 MG tablet Take 1 tablet (20 mg total) by mouth daily. 90 tablet 0   hydroxychloroquine (PLAQUENIL) 200 MG tablet Take 200 mg by mouth daily.     methotrexate (RHEUMATREX) 2.5 MG tablet Take 15 mg by mouth every Monday.     midodrine (PROAMATINE) 5 MG tablet Take 1 tablet (5 mg total) by mouth 3 (three) times daily with meals. (Patient not taking: Reported on 01/31/2023) 90 tablet 1   montelukast (SINGULAIR) 10 MG tablet Take 10 mg by mouth at bedtime.     nitroGLYCERIN (NITROSTAT) 0.4 MG SL  tablet Place 1 tablet (0.4 mg total) under the tongue every 5 (five) minutes as needed for chest pain. (Patient not taking: Reported on 02/07/2023) 30 tablet 0   potassium chloride (KLOR-CON M) 10 MEQ tablet Take 1 tablet (10 mEq total) by mouth 2 (two) times daily. (Patient taking differently: Take 10 mEq by mouth 2 (two) times daily. Pt reports taking once daily) 14 tablet 0   sacubitril-valsartan (ENTRESTO) 24-26 MG Take 1 tablet by mouth daily.     spironolactone (ALDACTONE) 25 MG tablet Take 0.5 tablets (12.5 mg total)  by mouth daily. 30 tablet 1   No current facility-administered medications for this visit.     PHYSICAL EXAM:  General:  Well appearing. No resp difficulty HEENT: normal Neck: supple. JVP flat. No lymphadenopathy or thryomegaly appreciated. Cor: PMI normal. Regular rate & rhythm. No rubs, gallops or murmurs. Lungs: clear Abdomen: soft, nontender, nondistended. No hepatosplenomegaly. No bruits or masses.  Extremities: no cyanosis, clubbing, rash, trace pedal edema bilaterally; fingers deformed due to RA Neuro: alert & oriented x3, cranial nerves grossly intact. Moves all 4 extremities w/o difficulty. Affect pleasant.   ECG: not done   ASSESSMENT & PLAN:  1: NICM with reduced ejection fraction- - likely due to HTN/ PE or untreated OSA - NYHA class II - euvolemic today - weighing daily; reminded to call for an overnight weight gain of > 2 pounds or a weekly weight gain of > 5 pounds - weight 169 pounds from last visit here 1 month ago - Echo 05/03/21: EF of 45-50% with moderate LAE.  - Echo 01/29/22: EF of 45-50% along with mild LVH, moderately elevated PA pressure and mild MR. - Echo 11/11/22: EF 20-25% with mild LAE, mild MR. Eugenie Birks Myoview 11/10/2022 revealed anterolateral and inferolateral scar without ischemia in the absence of chest pain. Cardiac catheterization was deferred.  - not adding salt and does read food labels for sodium content - continue entresto  24/26mg  BID - continue carvedilol 3.125mg  BID - continue spironolactone 12.5mg  daily - continue potassium daily - continue furosemide 20mg  daily - continue jardiance 10mg  daily - BMP today - once GDMT is as optimized as possible, will update echo - saw cardiology (Paraschos) 07/24 - BNP 11/26/22 was 2966.8  2: HTN- - BP  - sees PCP ([facility])  - BMP 03/05/23 reviewed and showed sodium 142, potassium 3.9, creatinine 1.1 & GFR 51  3: Obstructive sleep apnea- - does not wear CPAP - wearing 2L oxygen at bedtime and PRN during the day - referral made to [facility] pulmonology at last visit, per her request  4: RA- - saw rheumatologist Allena Katz) 03/24 - is hoping to see the hand specialist to see if he can decrease progress of her RA  5: PE- - saw hematology Cathie Hoops) 06/24 - continue apixaban 2.5mg  BID - 2L oxygen at bedtime and PRN during the day

## 2023-04-18 ENCOUNTER — Encounter: Payer: Self-pay | Admitting: Family

## 2023-04-18 ENCOUNTER — Ambulatory Visit: Payer: 59 | Admitting: Family

## 2023-04-18 ENCOUNTER — Encounter: Payer: Self-pay | Admitting: Pharmacy Technician

## 2023-04-18 VITALS — BP 146/85 | HR 69 | Ht 66.0 in | Wt 176.0 lb

## 2023-04-18 DIAGNOSIS — I5022 Chronic systolic (congestive) heart failure: Secondary | ICD-10-CM | POA: Diagnosis not present

## 2023-04-18 DIAGNOSIS — M069 Rheumatoid arthritis, unspecified: Secondary | ICD-10-CM | POA: Diagnosis not present

## 2023-04-18 DIAGNOSIS — K219 Gastro-esophageal reflux disease without esophagitis: Secondary | ICD-10-CM | POA: Insufficient documentation

## 2023-04-18 DIAGNOSIS — G4733 Obstructive sleep apnea (adult) (pediatric): Secondary | ICD-10-CM

## 2023-04-18 DIAGNOSIS — L905 Scar conditions and fibrosis of skin: Secondary | ICD-10-CM | POA: Insufficient documentation

## 2023-04-18 DIAGNOSIS — J45909 Unspecified asthma, uncomplicated: Secondary | ICD-10-CM | POA: Diagnosis not present

## 2023-04-18 DIAGNOSIS — R079 Chest pain, unspecified: Secondary | ICD-10-CM | POA: Insufficient documentation

## 2023-04-18 DIAGNOSIS — I2510 Atherosclerotic heart disease of native coronary artery without angina pectoris: Secondary | ICD-10-CM | POA: Insufficient documentation

## 2023-04-18 DIAGNOSIS — I5020 Unspecified systolic (congestive) heart failure: Secondary | ICD-10-CM | POA: Diagnosis not present

## 2023-04-18 DIAGNOSIS — E785 Hyperlipidemia, unspecified: Secondary | ICD-10-CM | POA: Insufficient documentation

## 2023-04-18 DIAGNOSIS — I110 Hypertensive heart disease with heart failure: Secondary | ICD-10-CM | POA: Diagnosis not present

## 2023-04-18 DIAGNOSIS — I10 Essential (primary) hypertension: Secondary | ICD-10-CM | POA: Diagnosis not present

## 2023-04-18 DIAGNOSIS — Z87891 Personal history of nicotine dependence: Secondary | ICD-10-CM | POA: Diagnosis not present

## 2023-04-18 DIAGNOSIS — I428 Other cardiomyopathies: Secondary | ICD-10-CM | POA: Diagnosis not present

## 2023-04-18 DIAGNOSIS — Z79899 Other long term (current) drug therapy: Secondary | ICD-10-CM | POA: Insufficient documentation

## 2023-04-18 MED ORDER — SACUBITRIL-VALSARTAN 49-51 MG PO TABS: 1.0000 | ORAL_TABLET | Freq: Two times a day (BID) | ORAL | 5 refills | Status: DC

## 2023-04-18 MED ORDER — SACUBITRIL-VALSARTAN 24-26 MG PO TABS
1.0000 | ORAL_TABLET | Freq: Two times a day (BID) | ORAL | 3 refills | Status: DC
Start: 2023-04-18 — End: 2023-05-16

## 2023-04-18 MED ORDER — DAPAGLIFLOZIN PROPANEDIOL 10 MG PO TABS: 10.0000 mg | ORAL_TABLET | Freq: Every day | ORAL | 5 refills | Status: DC

## 2023-04-18 NOTE — Patient Instructions (Addendum)
Stop jardiance, wait 2 days and then begin farxiga  Take your current entresto dose twice daily.

## 2023-04-18 NOTE — Consult Note (Addendum)
[facility] - HEART FAILURE CLINIC - PHARMACIST COUNSELING NOTE  Guideline-Directed Medical Therapy/Evidence Based Medicine  ACE/ARB/ARNI:  Sacubitril-valsartan 24/26mg  once daily  (she is mistakenly taking it only once daily) Beta Blocker: Carvedilol 3.125 mg twice daily Aldosterone Antagonist: Spironolactone 12.5 mg daily Diuretic: Furosemide 20 mg daily SGLT2i: Empagliflozin 10 mg daily  Adherence Assessment  Do you ever forget to take your medication? [] Yes [x] No  Do you ever skip doses due to side effects? [] Yes [x] No  Do you have trouble affording your medicines? [] Yes [x] No  Are you ever unable to pick up your medication due to transportation difficulties? [] Yes [x] No  Do you ever stop taking your medications because you don't believe they are helping? [] Yes [x] No  Do you check your weight daily? [x] Yes [] No   Adherence strategy: Uses medication bag supplied by [facility]  Barriers to obtaining medications: None  Vital signs: HR 69, BP 112/58, weight (pounds) 169 ECHO: Date 11/11/2022, EF 20-25%, notes: LV global hypokinesis and grade I DD     Latest Ref Rng & Units 02/07/2023    2:28 PM 11/28/2022    4:04 AM 11/27/2022    3:30 AM  BMP  Glucose 70 - 99 mg/dL 161  096  045   BUN 8 - 23 mg/dL 24  32  24   Creatinine 0.44 - 1.00 mg/dL 4.09  8.11  9.14   Sodium 135 - 145 mmol/L 141  139  140   Potassium 3.5 - 5.1 mmol/L 3.0  3.4  3.7   Chloride 98 - 111 mmol/L 106  104  105   CO2 22 - 32 mmol/L 26  27  25    Calcium 8.9 - 10.3 mg/dL 8.6  8.2  8.6     Past Medical History:  Diagnosis Date   Aortic atherosclerosis (HCC)    Asthma    Chest pain 11/10/2022   CHF (congestive heart failure) (HCC)    Coronary artery disease    Dyspnea    Edema of both legs    GERD (gastroesophageal reflux disease)    HFrEF (heart failure with reduced ejection fraction) (HCC)    History of 2019 novel coronavirus disease (COVID-19) 10/28/2020   Hypertension    Myocardial  infarction (HCC)    OSA (obstructive sleep apnea)    noncompliant with nocturnal PAP therapy   RA (rheumatoid arthritis) [facility])     ASSESSMENT 77 year old female who presents to the HF clinic for follow-up regarding HFrEF. Last ECHO was on 11/11/2022 and showed LVEF 20-25%. Today is first follow-up since starting Jardiance. Patient complains of itching since starting the medication. Inetta Fermo to follow up whether itching is in vaginal area or not.  Patient endorses not paying attention to preferred daily fluid restrictions. She mentions that she is thirsty and if anything, fluid intake has increased. I re-educated patient on importance of trying to stick to limit of 2 L of fluid per day because swelling prevents [place] from making furosemide PRN.  While performing med rec, discovered that patient has been taking Entresto once daily instead of twice daily. Re-educated patient on importance of taking it TWICE daily.  Recent ED Visit (past 6 months):  Date - 11/26/2022, CC - AoCHF Date - 11/10/2022, CC - Chest pain  PLAN HFrEF (EF 20-25%) Switch to taking Entresto 24/26mg  twice daily. Will not change to higher strength until we know how her BP responds to the twice daily dosing. Recommend checking follow-up BMP in 3 weeks Continue carvedilol, spironolactone,  and furosemide Pausing empagliflozin for a couple of days to see if itching goes away. If the itching goes away while holding Jardiance, recommend switching to [place] to see if she tolerates.    Time spent: 15 minutes  Will M. Dareen Piano, PharmD Clinical Pharmacist 04/18/2023 2:30 PM

## 2023-04-18 NOTE — Progress Notes (Signed)
PCP: Tonia Ghent, MD (last seen earlier this year) Primary Cardiologist: Marcina Millard, MD (last seen 07/24)  HPI:  Ms Lautz is a 77 y/o female with a history of asthma, insignifcant CAD, HTN, aortic atherosclerosis, asthma, GERD, obstructive sleep apnea, RA, PE (05/23), hyperlipidemia, previous tobacco use and chronic heart failure.   Admitted 11/10/22 due to chest pain and SOB due to a/c heart failure. IV diuresed. Myoview stress test done 3/8 -- Findings: Abnormal myocardial perfusion scan, Severely depressed left ventricular function less than 25%. Midodrine started. Admitted 11/26/22 due to SOB due to a/c heart failure.  Echo 05/03/21: EF of 45-50% with moderate LAE.  Echo 01/29/22: EF of 45-50% along with mild LVH, moderately elevated PA pressure and mild MR. Echo 11/11/22: EF 20-25% with mild LAE, mild MR.    Lexiscan Myoview 11/10/2022 revealed anterolateral and inferolateral scar without ischemia in the absence of chest pain. Cardiac catheterization was deferred.   She presents today for a HF follow-up visit with a chief complaint of minimal shortness of breath with moderate exertion. Chronic in nature. Has associated swelling in legs as the day progresses, resolves overnight. Sleeping better. Denies fatigue, chest pain, cough, palpitations, abdominal distention, dizziness or weight gain.  At last visit, jardiance 10mg  was started. Since starting this, she has developed itching over bilateral arms, legs and abdomen. ([place] has now been added as an allergy). She says that she has only been taking her entresto as 1 tablet in the morning and not BID.   ROS: All systems negative except as listed in HPI, PMH and Problem List.  SH:  Social History   Socioeconomic History   Marital status: Widowed    Spouse name: Not on file   Number of children: Not on file   Years of education: Not on file   Highest education level: Not on file  Occupational History   Not on file  Tobacco Use    Smoking status: Former    Current packs/day: 0.00    Types: Cigarettes    Quit date: 01/09/1995    Years since quitting: 28.2   Smokeless tobacco: Never  Vaping Use   Vaping status: Never Used  Substance and Sexual Activity   Alcohol use: No   Drug use: No   Sexual activity: Yes    Birth control/protection: None  Other Topics Concern   Not on file  Social History Narrative   Adult son   Social Determinants of Health   Financial Resource Strain: Medium Risk (10/29/2020)   [facility]   Overall Financial Resource Strain (CARDIA)    Difficulty of Paying Living Expenses: Somewhat hard  Food Insecurity: No Food Insecurity (11/10/2022)   Hunger Vital Sign    Worried About Running Out of Food in the Last Year: Never true    Ran Out of Food in the Last Year: Never true  Transportation Needs: No Transportation Needs (11/10/2022)   PRAPARE - Administrator, Civil Service (Medical): No    Lack of Transportation (Non-Medical): No  Physical Activity: Unknown (08/21/2017)   Exercise Vital Sign    Days of Exercise per Week: Patient declined    Minutes of Exercise per Session: Patient declined  Stress: Unknown (08/21/2017)   [employer] of Occupational Health - Occupational Stress Questionnaire    Feeling of Stress : Patient declined  Social Connections: Unknown (08/21/2017)   Social Connection and Isolation Panel [NHANES]    Frequency of Communication with Friends  and Family: Patient declined    Frequency of Social Gatherings with Friends and Family: Patient declined    Attends Religious Services: Patient declined    Active Member of Clubs or Organizations: Patient declined    Attends Banker Meetings: Patient declined    Marital Status: Patient declined  Intimate Partner Violence: Not At Risk (11/10/2022)   Humiliation, Afraid, Rape, and Kick questionnaire    Fear of Current or Ex-Partner: No    Emotionally Abused: No     Physically Abused: No    Sexually Abused: No    FH:  Family History  Problem Relation Age of Onset   CAD Mother    Diabetes Mother    Hypertension Mother    CAD Father    Diabetes Father    Hypertension Father    Stroke Father     Past Medical History:  Diagnosis Date   Aortic atherosclerosis (HCC)    Asthma    Chest pain 11/10/2022   CHF (congestive heart failure) (HCC)    Coronary artery disease    Dyspnea    Edema of both legs    GERD (gastroesophageal reflux disease)    HFrEF (heart failure with reduced ejection fraction) (HCC)    History of 2019 novel coronavirus disease (COVID-19) 10/28/2020   Hypertension    Myocardial infarction (HCC)    OSA (obstructive sleep apnea)    noncompliant with nocturnal PAP therapy   RA (rheumatoid arthritis) (HCC)     Current Outpatient Medications  Medication Sig Dispense Refill   acetaminophen (TYLENOL) 650 MG CR tablet Take 1,300 mg by mouth as needed for pain.     albuterol (VENTOLIN HFA) 108 (90 Base) MCG/ACT inhaler Inhale 1-2 puffs into the lungs every 6 (six) hours as needed for wheezing or shortness of breath.     alendronate (FOSAMAX) 70 MG tablet Take 70 mg by mouth once a week. Thursday     apixaban (ELIQUIS) 2.5 MG TABS tablet Take 1 tablet (2.5 mg total) by mouth 2 (two) times daily. 60 tablet 5   atorvastatin (LIPITOR) 40 MG tablet Take 1 tablet (40 mg total) by mouth every morning. 90 tablet 0   carvedilol (COREG) 3.125 MG tablet Take 1 tablet (3.125 mg total) by mouth 2 (two) times daily with a meal. 60 tablet 1   CVS VITAMIN B12 1000 MCG TBCR Take 1,000 mcg by mouth daily.     empagliflozin (JARDIANCE) 10 MG TABS tablet Take 1 tablet (10 mg total) by mouth daily before breakfast. 30 tablet 6   fluticasone (FLONASE) 50 MCG/ACT nasal spray Place 2 sprays into the nose daily as needed for allergies.     folic acid (FOLVITE) 1 MG tablet Take 1 mg by mouth daily.     furosemide (LASIX) 20 MG tablet Take 1 tablet (20 mg  total) by mouth daily. 90 tablet 0   hydroxychloroquine (PLAQUENIL) 200 MG tablet Take 200 mg by mouth daily.     methotrexate (RHEUMATREX) 2.5 MG tablet Take 15 mg by mouth every Monday.     midodrine (PROAMATINE) 5 MG tablet Take 1 tablet (5 mg total) by mouth 3 (three) times daily with meals. (Patient not taking: Reported on 01/31/2023) 90 tablet 1   montelukast (SINGULAIR) 10 MG tablet Take 10 mg by mouth at bedtime.     nitroGLYCERIN (NITROSTAT) 0.4 MG SL tablet Place 1 tablet (0.4 mg total) under the tongue every 5 (five) minutes as needed for chest pain. (Patient not taking:  Reported on 02/07/2023) 30 tablet 0   potassium chloride (KLOR-CON M) 10 MEQ tablet Take 1 tablet (10 mEq total) by mouth 2 (two) times daily. (Patient taking differently: Take 10 mEq by mouth 2 (two) times daily. Pt reports taking once daily) 14 tablet 0   sacubitril-valsartan (ENTRESTO) 24-26 MG Take 1 tablet by mouth daily.     spironolactone (ALDACTONE) 25 MG tablet Take 0.5 tablets (12.5 mg total) by mouth daily. 30 tablet 1   No current facility-administered medications for this visit.   Vitals:   04/18/23 1327  BP: (!) 146/85  Pulse: 69  SpO2: 98%  Weight: 176 lb (79.8 kg)  Height: 5\' 6"  (1.676 m)   Wt Readings from Last 3 Encounters:  04/18/23 176 lb (79.8 kg)  03/05/23 169 lb (76.7 kg)  02/07/23 172 lb 3.2 oz (78.1 kg)   Lab Results  Component Value Date   CREATININE 0.90 02/07/2023   CREATININE 0.96 11/28/2022   CREATININE 0.97 11/27/2022   PHYSICAL EXAM:  General:  Well appearing. No resp difficulty HEENT: normal Neck: supple. JVP flat. No lymphadenopathy or thryomegaly appreciated. Cor: PMI normal. Regular rate & rhythm. No rubs, gallops or murmurs. Lungs: clear Abdomen: soft, nontender, nondistended. No hepatosplenomegaly. No bruits or masses.  Extremities: no cyanosis, clubbing, excoriated scratch marks noted on bilateral lower legs/ arms, trace pedal edema bilaterally; fingers deformed  due to RA Neuro: alert & oriented x3, cranial nerves grossly intact. Moves all 4 extremities w/o difficulty. Affect pleasant.   ECG: not done   ASSESSMENT & PLAN:  1: NICM with reduced ejection fraction- - likely due to HTN/ PE or untreated OSA - NYHA class II - euvolemic today - weighing daily; reminded to call for an overnight weight gain of > 2 pounds or a weekly weight gain of > 5 pounds - weight up 7 pounds from last visit here 1 month ago - Echo 05/03/21: EF of 45-50% with moderate LAE.  - Echo 01/29/22: EF of 45-50% along with mild LVH, moderately elevated PA pressure and mild MR. - Echo 11/11/22: EF 20-25% with mild LAE, mild MR. Eugenie Birks Myoview 11/10/2022 revealed anterolateral and inferolateral scar without ischemia in the absence of chest pain. Cardiac catheterization was deferred.  - not adding salt and does read food labels for sodium content - increase entresto to 24/26mg  BID; she had only been taking it once daily; plan to increase this to 49/51mg  at next visit - continue carvedilol 3.125mg  BID (she confirms that she's taking this BID) - continue spironolactone 12.5mg  daily - continue furosemide 20mg  daily - stop jardiance, wait for 2 days and then begin farxiga 10mg  daily & hopefully she won't have any itching with it; instructed her to call back if she does and then it may have to be stopped as well - BMP next visit - reviewed no more than 2L of fluid daily - once GDMT is as optimized as possible, will update echo - saw cardiology ([facility]as[facility]) 07/24 - BNP 11/26/22 was 2966.8 - PharmD reconciled meds w/ patient  2: HTN- - BP 146/85 - sees PCP ([facility]ender @ [facility])  - BMP 03/05/23 reviewed and showed sodium 142, potassium 3.9, creatinine 1.1 & GFR 51  3: Obstructive sleep apnea- - does not wear CPAP - wearing 2L oxygen at bedtime and PRN during the day  4: RA- - saw rheumatologist Allena Katz) 03/24; returns later today - is hoping to see the hand specialist to see if  he can decrease progress of  her RA  5: PE- - saw hematology Cathie Hoops) 06/24 - continue apixaban 2.5mg  BID - 2L oxygen at bedtime and PRN during the day  Return in 3 weeks, sooner if needed.

## 2023-04-25 ENCOUNTER — Telehealth: Payer: Self-pay | Admitting: *Deleted

## 2023-04-25 NOTE — Telephone Encounter (Signed)
Pt called complaining of low bp 92/50 and says she feels dizzy when her bp is that low. Pt asked if she needed to make any medication changes.   Routed to [employer]

## 2023-04-27 IMAGING — CT CT RENAL STONE PROTOCOL
2 of 4 series · 16 of 46 positions shown, 18 images · non-contrast
Comparison: Same-day ultrasound

CLINICAL DATA: Flank pain, kidney stone suspected. Lower abdominal
pain started early this morning. Nausea no vomiting. No fevers or
chills.

EXAM:
CT ABDOMEN AND PELVIS WITHOUT CONTRAST
TECHNIQUE: Multidetector CT imaging of the abdomen and pelvis was performed
following the standard protocol without IV contrast.

[Series 2: stone full standard · axial · 0.67mm/px · z∈[-480,-20]mm · 13 of 101 slices shown, 15 images]
[im 5/101  soft-tissue]
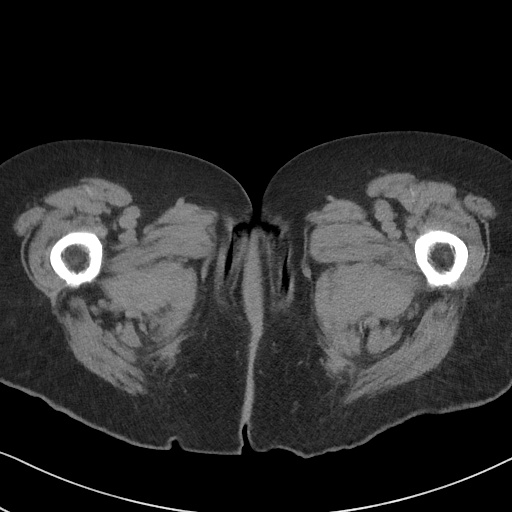
[im 5/101  bone]
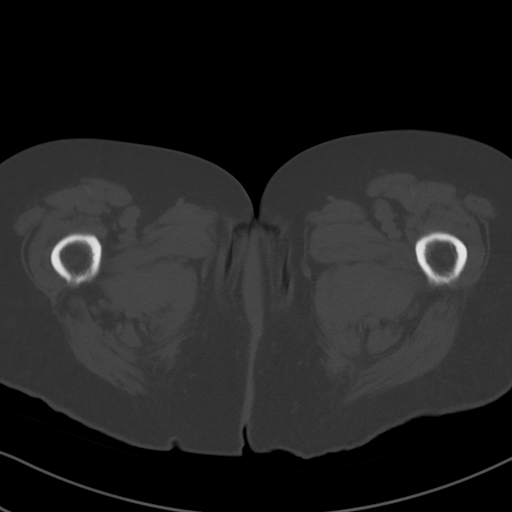
[im 13/101  soft-tissue]
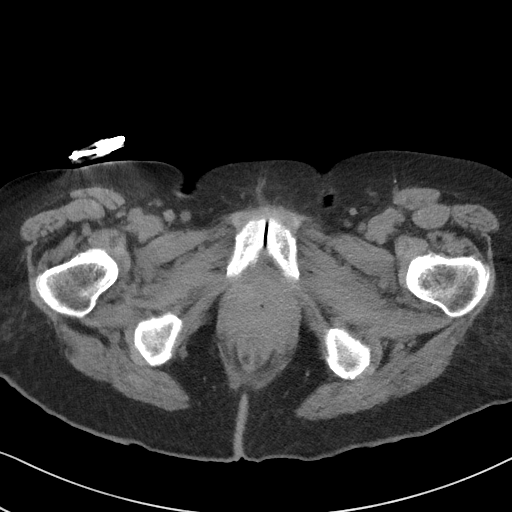
[im 21/101  soft-tissue]
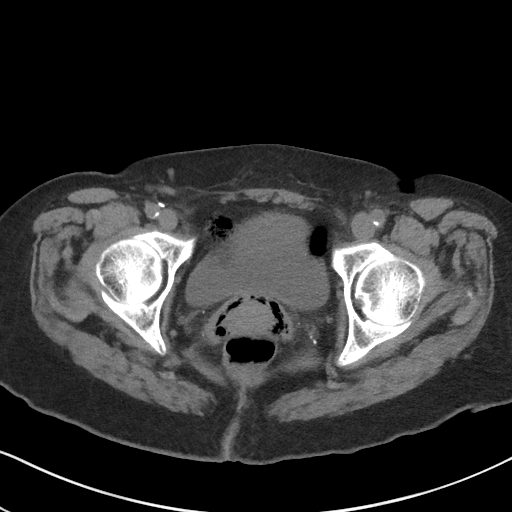
[im 29/101  soft-tissue]
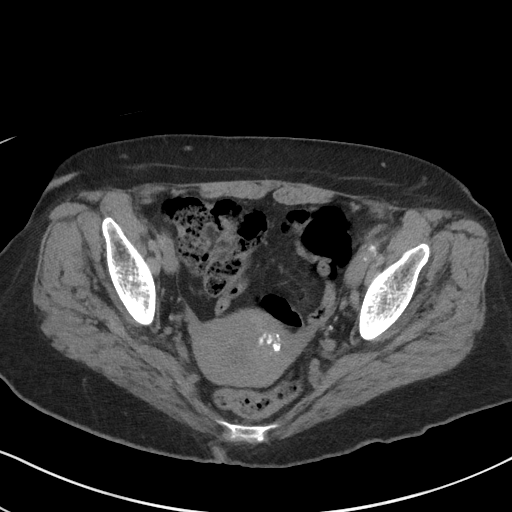
[im 37/101  soft-tissue]
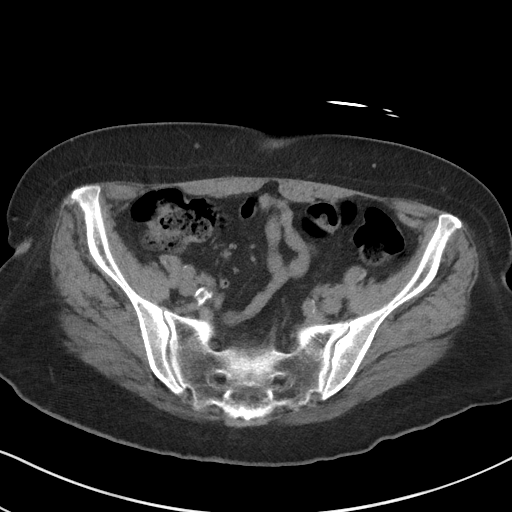
[im 45/101  soft-tissue]
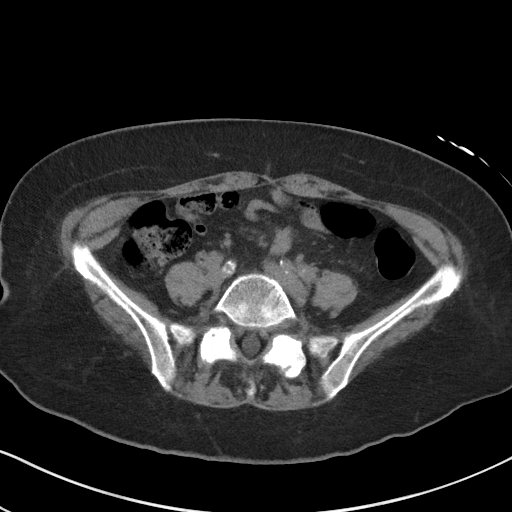
[im 53/101  soft-tissue]
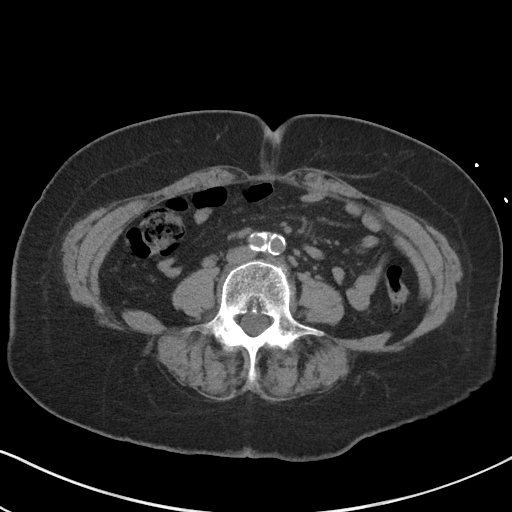
[im 57/101  soft-tissue]
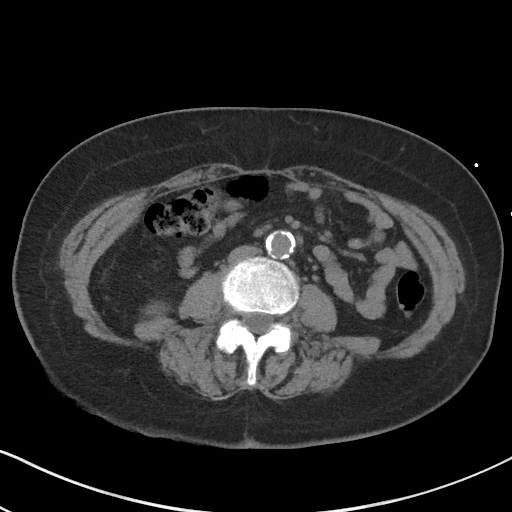
[im 65/101  soft-tissue]
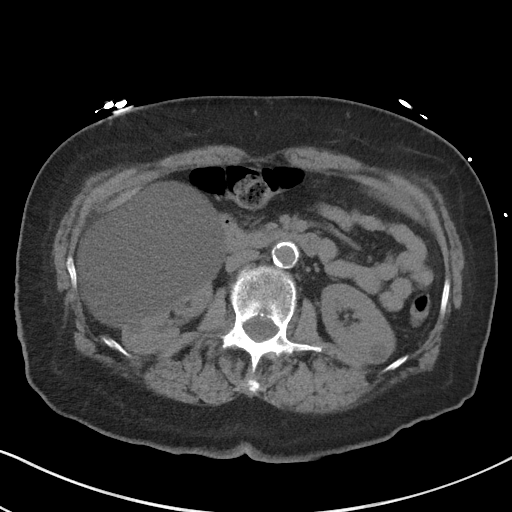
[im 65/101  bone]
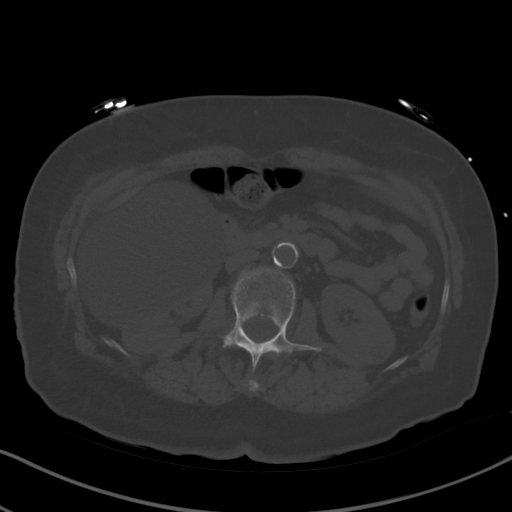
[im 73/101  soft-tissue]
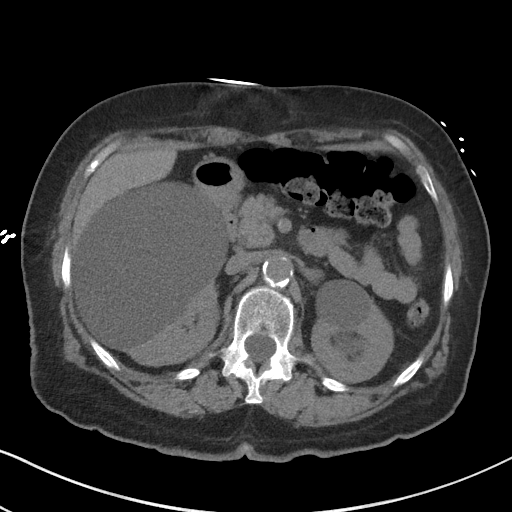
[im 81/101  soft-tissue]
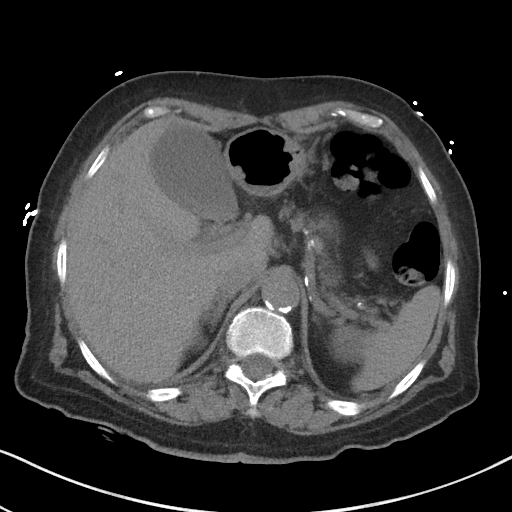
[im 89/101  soft-tissue]
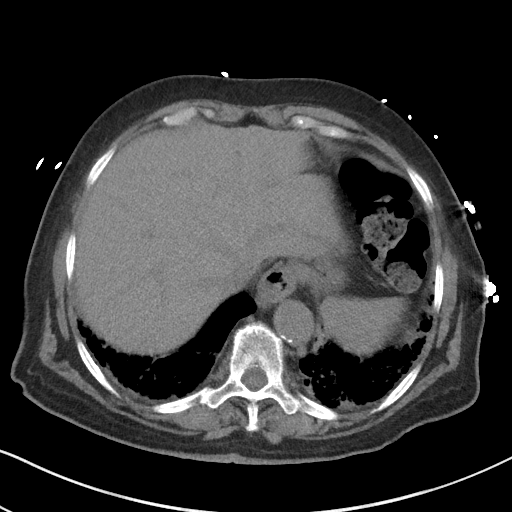
[im 97/101  soft-tissue]
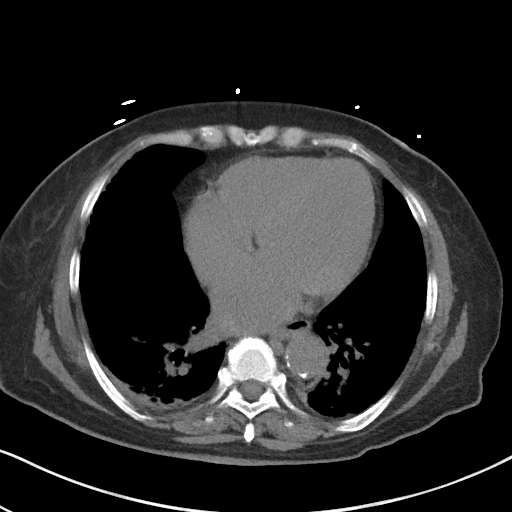

[Series 5: coronal · coronal · 0.84mm/px · 3 of 130 slices shown]
[im 44/130  soft-tissue]
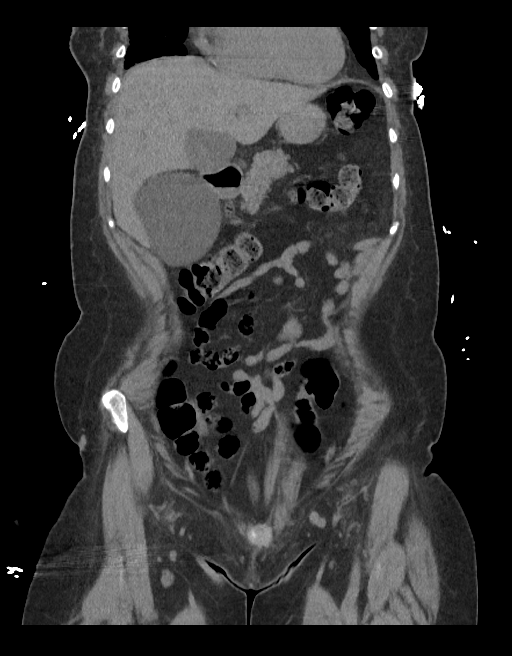
[im 58/130  soft-tissue]
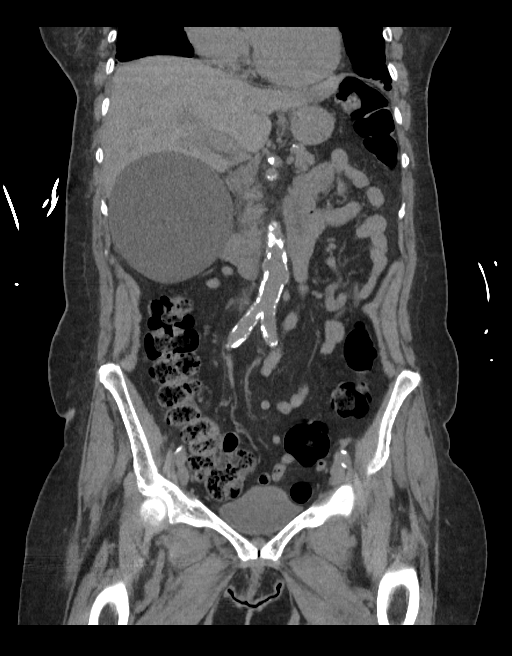
[im 72/130  soft-tissue]
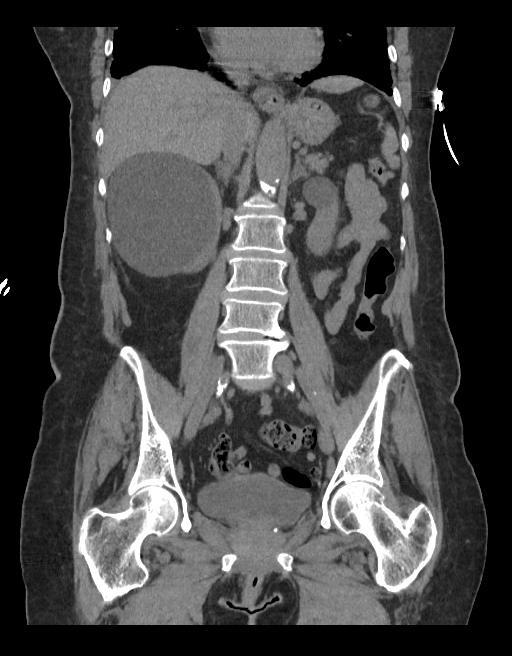

[16 of 46 positions shown; findings below may reference images not displayed]

FINDINGS: Lower chest: Bibasilar peripheral fibrotic change with traction
bronchiectasis.

Hepatobiliary: There are multiple hypodense liver lesions compatible
with cysts. Distended gallbladder with likely multiple small stones.
No adjacent inflammatory change.

Pancreas: Unremarkable. No pancreatic ductal dilatation or
surrounding inflammatory changes.

Spleen: Normal in size without focal abnormality.

Adrenals/Urinary Tract: Adrenal glands are unremarkable. There are
multiple bilateral large renal cysts, largest on the right measuring
up to 1.9 cm and on the left measuring 4.2 cm in the upper pole.
There is no nephrolithiasis. There is no hydronephrosis. Small
hiatal hernia. No evidence of bowel obstruction. Normal appendix.
Scattered colonic diverticuli.

Stomach/Bowel: Stomach is within normal limits. Appendix appears
normal. No evidence of bowel wall thickening, distention, or
inflammatory changes.

Vascular/Lymphatic: Aorto bi-iliac atherosclerotic calcifications as
well as the visceral branches. No AAA.

Reproductive: Multiple calcified uterine fibroids.

Other: No abdominal wall hernia or abnormality. No abdominopelvic
ascites.

Musculoskeletal: There is no acute osseous abnormality. No
suspicious lytic or blastic lesions. Severe lower lumbar facet
arthritis. Mild bilateral hip degenerative changes.
IMPRESSION: No acute abdominopelvic abnormality.  Normal appendix.

Distended gallbladder with multiple small stones, see separately
dictated abdominal ultrasound.

Multiple large bilateral renal cysts. No hydronephrosis or
nephrolithiasis.

Additional incidental findings as described above.

Aortic Atherosclerosis ([id number].X).

## 2023-05-04 NOTE — Telephone Encounter (Signed)
  Tina Fields from [facility] called stating she received a referral from [facility] but no ofv notes were sent. Faxed last ofv w/ Tina to [phone] on 7/18.

## 2023-05-10 ENCOUNTER — Ambulatory Visit: Payer: 59 | Attending: Family | Admitting: Family

## 2023-05-10 ENCOUNTER — Encounter: Payer: 59 | Admitting: Family

## 2023-05-10 ENCOUNTER — Encounter: Payer: Self-pay | Admitting: Family

## 2023-05-10 VITALS — BP 136/71 | HR 81 | Wt 179.6 lb

## 2023-05-10 DIAGNOSIS — J45909 Unspecified asthma, uncomplicated: Secondary | ICD-10-CM | POA: Diagnosis not present

## 2023-05-10 DIAGNOSIS — G4733 Obstructive sleep apnea (adult) (pediatric): Secondary | ICD-10-CM | POA: Insufficient documentation

## 2023-05-10 DIAGNOSIS — M069 Rheumatoid arthritis, unspecified: Secondary | ICD-10-CM | POA: Insufficient documentation

## 2023-05-10 DIAGNOSIS — Z87891 Personal history of nicotine dependence: Secondary | ICD-10-CM | POA: Insufficient documentation

## 2023-05-10 DIAGNOSIS — I700 Atherosclerosis of aorta: Secondary | ICD-10-CM | POA: Insufficient documentation

## 2023-05-10 DIAGNOSIS — I110 Hypertensive heart disease with heart failure: Secondary | ICD-10-CM | POA: Insufficient documentation

## 2023-05-10 DIAGNOSIS — I5022 Chronic systolic (congestive) heart failure: Secondary | ICD-10-CM | POA: Insufficient documentation

## 2023-05-10 DIAGNOSIS — K219 Gastro-esophageal reflux disease without esophagitis: Secondary | ICD-10-CM | POA: Diagnosis not present

## 2023-05-10 DIAGNOSIS — E785 Hyperlipidemia, unspecified: Secondary | ICD-10-CM | POA: Diagnosis not present

## 2023-05-10 DIAGNOSIS — I428 Other cardiomyopathies: Secondary | ICD-10-CM | POA: Insufficient documentation

## 2023-05-10 DIAGNOSIS — I10 Essential (primary) hypertension: Secondary | ICD-10-CM

## 2023-05-10 DIAGNOSIS — Z79899 Other long term (current) drug therapy: Secondary | ICD-10-CM | POA: Diagnosis not present

## 2023-05-10 DIAGNOSIS — I2510 Atherosclerotic heart disease of native coronary artery without angina pectoris: Secondary | ICD-10-CM | POA: Insufficient documentation

## 2023-05-10 DIAGNOSIS — I2782 Chronic pulmonary embolism: Secondary | ICD-10-CM

## 2023-05-10 DIAGNOSIS — M7989 Other specified soft tissue disorders: Secondary | ICD-10-CM | POA: Insufficient documentation

## 2023-05-10 LAB — BASIC METABOLIC PANEL
BUN/Creatinine Ratio: 19 (ref 12–28)
BUN: 19 mg/dL (ref 8–27)
CO2: 20 mmol/L (ref 20–29)
Calcium: 8.8 mg/dL (ref 8.7–10.3)
Chloride: 109 mmol/L — ABNORMAL HIGH (ref 96–106)
Creatinine, Ser: 1.01 mg/dL — ABNORMAL HIGH (ref 0.57–1.00)
Glucose: 129 mg/dL — ABNORMAL HIGH (ref 70–99)
Potassium: 3.5 mmol/L (ref 3.5–5.2)
Sodium: 143 mmol/L (ref 134–144)
eGFR: 57 mL/min/{1.73_m2} — ABNORMAL LOW (ref >59)

## 2023-05-10 NOTE — Progress Notes (Signed)
ADVANCED HEART FAILURE FOLLOW UP CLINIC NOTE  PCP: Tonia Ghent, MD (last seen earlier this year) Primary Cardiologist: Marcina Millard, MD (last seen 07/24)  HPI:  Tina Fields is a 77 y/o female with a history of asthma, insignifcant CAD, HTN, aortic atherosclerosis, asthma, GERD, obstructive sleep apnea, RA, PE (05/23), hyperlipidemia, previous tobacco use and chronic heart failure.   Admitted 11/10/22 due to chest pain and SOB due to a/c heart failure. IV diuresed. Myoview stress test done 3/8 Findings: Abnormal myocardial perfusion scan, Severely depressed left ventricular function less than 25%. Midodrine started. Admitted 11/26/22 due to SOB due to a/c heart failure.  Echo 05/03/21: EF of 45-50% with moderate LAE.  Echo 01/29/22: EF of 45-50% along with mild LVH, moderately elevated PA pressure and mild MR. Echo 11/11/22: EF 20-25% with mild LAE, mild MR.    Lexiscan Myoview 11/10/2022 revealed anterolateral and inferolateral scar without ischemia in the absence of chest pain. Cardiac catheterization was deferred.   She presents today for a HF follow-up visit with a chief complaint of minimal fatigue with moderate exertion. Describes this as chronic in nature. Has associated dizziness, bilateral hand / right hip pain and bilateral lower leg swelling along with this. Denies shortness of breath, chest pain, cough, palpitations, abdominal distention, dizziness or difficulty sleeping.  Her rheumatologist had tried stopping her prednisone and in <48 hours, her pain flared. She was just able to resume it today. She did take furosemide today due to swelling in her feet.    Getting ready to visit son in [place] and will be riding in a car. Plans to be there for ~ 1 week. Does not have compression socks.  At last visit, entresto was resumed at BID (she had been taking it daily) and farxiga was tried as she had developed itching with jardiance. Denies having any itching or any issues with farxiga.    ROS: All systems negative except as listed in HPI, PMH and Problem List.  SH:  Social History   Socioeconomic History   Marital status: Widowed    Spouse name: Not on file   Number of children: Not on file   Years of education: Not on file   Highest education level: Not on file  Occupational History   Not on file  Tobacco Use   Smoking status: Former    Current packs/day: 0.00    Types: Cigarettes    Quit date: 01/09/1995    Years since quitting: 28.3   Smokeless tobacco: Never  Vaping Use   Vaping status: Never Used  Substance and Sexual Activity   Alcohol use: No   Drug use: No   Sexual activity: Yes    Birth control/protection: None  Other Topics Concern   Not on file  Social History Narrative   Adult son   Social Determinants of Health   Financial Resource Strain: Medium Risk (10/29/2020)   Received from [facility]   Overall Financial Resource Strain (CARDIA)    Difficulty of Paying Living Expenses: Somewhat hard  Food Insecurity: No Food Insecurity (11/10/2022)   Hunger Vital Sign    Worried About Running Out of Food in the Last Year: Never true    Ran Out of Food in the Last Year: Never true  Transportation Needs: No Transportation Needs (11/10/2022)   PRAPARE - Administrator, Civil Service (Medical): No    Lack of Transportation (Non-Medical): No  Physical Activity: Unknown (08/21/2017)   Exercise Vital  Sign    Days of Exercise per Week: Patient declined    Minutes of Exercise per Session: Patient declined  Stress: Unknown (08/21/2017)   [employer] of Occupational Health - Occupational Stress Questionnaire    Feeling of Stress : Patient declined  Social Connections: Unknown (08/21/2017)   Social Connection and Isolation Panel [NHANES]    Frequency of Communication with Friends and Family: Patient declined    Frequency of Social Gatherings with Friends and Family: Patient declined    Attends Religious Services:  Patient declined    Database administrator or Organizations: Patient declined    Attends Banker Meetings: Patient declined    Marital Status: Patient declined  Intimate Partner Violence: Not At Risk (11/10/2022)   Humiliation, Afraid, Rape, and Kick questionnaire    Fear of Current or Ex-Partner: No    Emotionally Abused: No    Physically Abused: No    Sexually Abused: No    FH:  Family History  Problem Relation Age of Onset   CAD Mother    Diabetes Mother    Hypertension Mother    CAD Father    Diabetes Father    Hypertension Father    Stroke Father     Past Medical History:  Diagnosis Date   Aortic atherosclerosis (HCC)    Asthma    Chest pain 11/10/2022   CHF (congestive heart failure) (HCC)    Coronary artery disease    Dyspnea    Edema of both legs    GERD (gastroesophageal reflux disease)    HFrEF (heart failure with reduced ejection fraction) (HCC)    History of 2019 novel coronavirus disease (COVID-19) 10/28/2020   Hypertension    Myocardial infarction (HCC)    OSA (obstructive sleep apnea)    noncompliant with nocturnal PAP therapy   RA (rheumatoid arthritis) (HCC)     Current Outpatient Medications  Medication Sig Dispense Refill   acetaminophen (TYLENOL) 650 MG CR tablet Take 1,300 mg by mouth as needed for pain.     albuterol (VENTOLIN HFA) 108 (90 Base) MCG/ACT inhaler Inhale 1-2 puffs into the lungs every 6 (six) hours as needed for wheezing or shortness of breath.     alendronate (FOSAMAX) 70 MG tablet Take 70 mg by mouth once a week. Thursday     apixaban (ELIQUIS) 2.5 MG TABS tablet Take 1 tablet (2.5 mg total) by mouth 2 (two) times daily. 60 tablet 5   atorvastatin (LIPITOR) 40 MG tablet Take 1 tablet (40 mg total) by mouth every morning. 90 tablet 0   carvedilol (COREG) 3.125 MG tablet Take 1 tablet (3.125 mg total) by mouth 2 (two) times daily with a meal. 60 tablet 1   [employer] VITAMIN B12 1000 MCG TBCR Take 1,000 mcg by mouth daily.      dapagliflozin propanediol (FARXIGA) 10 MG TABS tablet Take 1 tablet (10 mg total) by mouth daily before breakfast. 30 tablet 5   fluticasone (FLONASE) 50 MCG/ACT nasal spray Place 2 sprays into the nose daily as needed for allergies.     folic acid (FOLVITE) 1 MG tablet Take 1 mg by mouth daily.     furosemide (LASIX) 20 MG tablet Take 1 tablet (20 mg total) by mouth daily. 90 tablet 0   hydroxychloroquine (PLAQUENIL) 200 MG tablet Take 200 mg by mouth daily.     methotrexate (RHEUMATREX) 2.5 MG tablet Take 15 mg by mouth every Monday.     midodrine (PROAMATINE) 5 MG tablet Take  1 tablet (5 mg total) by mouth 3 (three) times daily with meals. (Patient not taking: Reported on 04/18/2023) 90 tablet 1   montelukast (SINGULAIR) 10 MG tablet Take 10 mg by mouth at bedtime.     nitroGLYCERIN (NITROSTAT) 0.4 MG SL tablet Place 1 tablet (0.4 mg total) under the tongue every 5 (five) minutes as needed for chest pain. 30 tablet 0   potassium chloride (KLOR-CON M) 10 MEQ tablet Take 1 tablet (10 mEq total) by mouth 2 (two) times daily. (Patient not taking: Reported on 04/18/2023) 14 tablet 0   sacubitril-valsartan (ENTRESTO) 24-26 MG Take 1 tablet by mouth 2 (two) times daily. 60 tablet 3   spironolactone (ALDACTONE) 25 MG tablet Take 0.5 tablets (12.5 mg total) by mouth daily. 30 tablet 1   No current facility-administered medications for this visit.   Vitals:   05/10/23 1053  BP: 136/71  Pulse: 81  SpO2: 98%  Weight: 179 lb 9.6 oz (81.5 kg)   Wt Readings from Last 3 Encounters:  05/10/23 179 lb 9.6 oz (81.5 kg)  04/18/23 176 lb (79.8 kg)  03/05/23 169 lb (76.7 kg)   Lab Results  Component Value Date   CREATININE 0.90 02/07/2023   CREATININE 0.96 11/28/2022   CREATININE 0.97 11/27/2022   PHYSICAL EXAM:  General:  Well appearing. No resp difficulty HEENT: normal Neck: supple. JVP flat. No lymphadenopathy or thryomegaly appreciated. Cor: PMI normal. Regular rate & rhythm. No rubs, gallops  or murmurs. Lungs: clear Abdomen: soft, nontender, nondistended. No hepatosplenomegaly. No bruits or masses.  Extremities: no cyanosis, clubbing, fingers deformed due to RA, trace pitting bilaterally Neuro: alert & oriented x3, cranial nerves grossly intact. Moves all 4 extremities w/o difficulty. Affect pleasant.   ECG: not done   ASSESSMENT & PLAN:  1: NICM with reduced ejection fraction- - likely due to HTN/ PE or untreated OSA - NYHA class II - euvolemic today - weighing daily; reminded to call for an overnight weight gain of > 2 pounds or a weekly weight gain of > 5 pounds - weight up 3 pounds from last visit here 3 weeks ago - Echo 05/03/21: EF of 45-50% with moderate LAE.  - Echo 01/29/22: EF of 45-50% along with mild LVH, moderately elevated PA pressure and mild MR. - Echo 11/11/22: EF 20-25% with mild LAE, mild MR. Eugenie Birks Myoview 11/10/2022 revealed anterolateral and inferolateral scar without ischemia in the absence of chest pain. Cardiac catheterization was deferred.  - not adding salt and does read food labels for sodium content - continue entresto 24/26mg  BID; plan to titrate this to 49/51mg  BID after lab results and after she returns from [place]; if titrate this, BMP at next visit - continue carvedilol 3.125mg  BID  - continue spironolactone 12.5mg  daily - continue furosemide 20mg  daily PRN - continue farxiga 10mg  daily - BMP today - reviewed no more than 2L of fluid daily - once GDMT is as optimized as possible, will update echo - saw cardiology (Paraschos) 07/24 - encouraged her to get compression socks for the ride to [place] and elevate her legs when she gets there - BNP 11/26/22 was 2966.8  2: HTN- - BP 136/71 - sees PCP ([facility])  - BMP 03/05/23 reviewed and showed sodium 142, potassium 3.9, creatinine 1.1 & GFR 51 - BMP today  3: Obstructive sleep apnea- - does not wear CPAP - wearing 2L oxygen at bedtime and PRN during the day  4: RA- -  saw rheumatologist Allena Katz)  08/24 - had prednisone stopped but then RA flared and she just resumed it  5: PE- - saw hematology Cathie Hoops) 06/24 - continue apixaban 2.5mg  BID - 2L oxygen at bedtime and PRN during the day  Return in 6 weeks, sooner if needed.

## 2023-05-16 ENCOUNTER — Telehealth: Payer: Self-pay

## 2023-05-16 DIAGNOSIS — I5022 Chronic systolic (congestive) heart failure: Secondary | ICD-10-CM

## 2023-05-16 MED ORDER — ENTRESTO 49-51 MG PO TABS: 1.0000 | ORAL_TABLET | Freq: Two times a day (BID) | ORAL | 3 refills | Status: DC

## 2023-05-16 NOTE — Telephone Encounter (Addendum)
Tina Sniff, RN 05/16/2023  9:13 AM EDT Back to Top    Pt aware, agreeable, and verbalized understanding  Labs look good. Increase your Entresto to 49/51 mg twice a day. Finish current dose by taking 2 tablets two times  a day. Resume 1 tablet of Entresto 49/51 two times a day when you get the new prescription. BMET a week before appointment.   Med changed to reflect new orders. Bmet lab work sent.       Tina Freeze, FNP 05/11/2023  9:25 AM EDT     Labs look good. Once you return from [place], increase your entresto to 49/51mg  BID. Finish current dose by taking 2 tablets BID and then resume 1 tablet BID when you get the new RX. Get BMET the week before your next appt.

## 2023-06-05 ENCOUNTER — Other Ambulatory Visit: Payer: Self-pay

## 2023-06-05 ENCOUNTER — Emergency Department: Payer: 59

## 2023-06-05 ENCOUNTER — Emergency Department
Admission: EM | Admit: 2023-06-05 | Discharge: 2023-06-06 | Disposition: A | Discharge status: Home / Self Care | Payer: 59 | Attending: Emergency Medicine | Admitting: Emergency Medicine

## 2023-06-05 DIAGNOSIS — B9689 Other specified bacterial agents as the cause of diseases classified elsewhere: Secondary | ICD-10-CM | POA: Insufficient documentation

## 2023-06-05 DIAGNOSIS — I110 Hypertensive heart disease with heart failure: Secondary | ICD-10-CM | POA: Diagnosis not present

## 2023-06-05 DIAGNOSIS — R531 Weakness: Secondary | ICD-10-CM | POA: Diagnosis not present

## 2023-06-05 DIAGNOSIS — J069 Acute upper respiratory infection, unspecified: Secondary | ICD-10-CM | POA: Diagnosis not present

## 2023-06-05 DIAGNOSIS — R350 Frequency of micturition: Secondary | ICD-10-CM | POA: Diagnosis present

## 2023-06-05 DIAGNOSIS — Z20822 Contact with and (suspected) exposure to covid-19: Secondary | ICD-10-CM | POA: Diagnosis not present

## 2023-06-05 DIAGNOSIS — Z7901 Long term (current) use of anticoagulants: Secondary | ICD-10-CM | POA: Insufficient documentation

## 2023-06-05 DIAGNOSIS — I509 Heart failure, unspecified: Secondary | ICD-10-CM | POA: Diagnosis not present

## 2023-06-05 DIAGNOSIS — R5383 Other fatigue: Secondary | ICD-10-CM

## 2023-06-05 DIAGNOSIS — Z86711 Personal history of pulmonary embolism: Secondary | ICD-10-CM | POA: Diagnosis not present

## 2023-06-05 DIAGNOSIS — N390 Urinary tract infection, site not specified: Secondary | ICD-10-CM | POA: Insufficient documentation

## 2023-06-05 LAB — CBC WITH DIFFERENTIAL/PLATELET
Abs Immature Granulocytes: 0.02 10*3/uL (ref 0.00–0.07)
Basophils Absolute: 0 10*3/uL (ref 0.0–0.1)
Basophils Relative: 0 %
Eosinophils Absolute: 0 10*3/uL (ref 0.0–0.5)
Eosinophils Relative: 0 %
HCT: 40.9 % (ref 36.0–46.0)
Hemoglobin: 13 g/dL (ref 12.0–15.0)
Immature Granulocytes: 0 %
Lymphocytes Relative: 15 %
Lymphs Abs: 0.7 10*3/uL (ref 0.7–4.0)
MCH: 34.1 pg — ABNORMAL HIGH (ref 26.0–34.0)
MCHC: 31.8 g/dL (ref 30.0–36.0)
MCV: 107.3 fL — ABNORMAL HIGH (ref 80.0–100.0)
Monocytes Absolute: 0.5 10*3/uL (ref 0.1–1.0)
Monocytes Relative: 10 %
Neutro Abs: 3.6 10*3/uL (ref 1.7–7.7)
Neutrophils Relative %: 75 %
Platelets: 235 10*3/uL (ref 150–400)
RBC: 3.81 MIL/uL — ABNORMAL LOW (ref 3.87–5.11)
RDW: 14 % (ref 11.5–15.5)
WBC: 4.9 10*3/uL (ref 4.0–10.5)
nRBC: 0.6 % — ABNORMAL HIGH (ref 0.0–0.2)

## 2023-06-05 LAB — BASIC METABOLIC PANEL
Anion gap: 4 — ABNORMAL LOW (ref 5–15)
BUN: 14 mg/dL (ref 8–23)
CO2: 25 mmol/L (ref 22–32)
Calcium: 8.1 mg/dL — ABNORMAL LOW (ref 8.9–10.3)
Chloride: 110 mmol/L (ref 98–111)
Creatinine, Ser: 0.78 mg/dL (ref 0.44–1.00)
GFR, Estimated: >60 mL/min (ref >60)
Glucose, Bld: 96 mg/dL (ref 70–99)
Potassium: 3.8 mmol/L (ref 3.5–5.1)
Sodium: 139 mmol/L (ref 135–145)

## 2023-06-05 LAB — RESP PANEL BY RT-PCR (RSV, FLU A&B, COVID)  RVPGX2
Influenza A by PCR: NEGATIVE
Influenza B by PCR: NEGATIVE
Resp Syncytial Virus by PCR: NEGATIVE
SARS Coronavirus 2 by RT PCR: NEGATIVE

## 2023-06-05 LAB — D-DIMER, QUANTITATIVE: D-Dimer, Quant: 2.31 ug{FEU}/mL — ABNORMAL HIGH (ref 0.00–0.50)

## 2023-06-05 NOTE — ED Triage Notes (Signed)
Pt to ED via EMS from home, pt reports weakness xfew days as well as bilateral neck pain. 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 Pt takes eliquis. Pt denies chest pain or shortness of breath.

## 2023-06-05 NOTE — ED Provider Notes (Signed)
[facility] Provider Note    Event Date/Time   First MD Initiated Contact with Patient 06/05/23 2302     (approximate)   History   Weakness   HPI  Tina Fields is a 77 y.o. female   Past medical history of PE on Eliquis, hypertension, RA, heart failure with reduced ejection fraction presents emergency department with generalized weakness, fatigue, sinus congestion, cough, and urinary frequency and foul-smelling urine for the last 2 to 3 days.  Went to the family gathering on Saturday with no known sick contacts but many close exposures.  Called visiting nurse on Sunday who ran viral testing but was negative.  Was unable to produce a urine sample at that time.  Today she has developed some bilateral submandibular tenderness.  No fever.  Denies chest pain, shortness of breath, abdominal pain.  Lives alone and completely independent with all activities of daily living.  Has been fully compliant with her medications including her Eliquis.  External Medical Documents Reviewed: Family medicine note dated 05/10/2023 document past medical history and medications      Physical Exam   Triage Vital Signs: ED Triage Vitals  Encounter Vitals Group     BP 06/05/23 2102 132/63     Systolic BP Percentile --      Diastolic BP Percentile --      Pulse Rate 06/05/23 2102 67     Resp 06/05/23 2102 18     Temp 06/05/23 2102 98.1 F (36.7 C)     Temp src --      SpO2 06/05/23 2056 97 %     Weight 06/05/23 2101 175 lb (79.4 kg)     Height 06/05/23 2101 5\' 6"  (1.676 m)     Head Circumference --      Peak Flow --      Pain Score 06/05/23 2101 7     Pain Loc --      Pain Education --      Exclude from Growth Chart --     Most recent vital signs: Vitals:   06/05/23 2102 06/06/23 0030  BP: 132/63 (!) 164/85  Pulse: 67 66  Resp: 18 19  Temp: 98.1 F (36.7 C)   SpO2: 100% 97%    General: Awake, no distress.  CV:  Good peripheral perfusion.   Resp:  Normal effort. Abd:  No distention.  Other:  Awake alert pleasant in no acute distress with normal vital signs no hypoxemia no fever.  Lungs clear without focalities or wheezing, no rales, no marked peripheral edema.  Soft nontender abdomen.  No marked masses in the neck area or submandibular area, neck is supple with full range of motion.   ED Results / Procedures / Treatments   Labs (all labs ordered are listed, but only abnormal results are displayed) Labs Reviewed  BASIC METABOLIC PANEL - Abnormal; Notable for the following components:      Result Value   Calcium 8.1 (*)    Anion gap 4 (*)    All other components within normal limits  URINALYSIS, ROUTINE W REFLEX MICROSCOPIC - Abnormal; Notable for the following components:   Color, Urine YELLOW (*)    APPearance CLEAR (*)    Glucose, UA >=500 (*)    Hgb urine dipstick MODERATE (*)    Leukocytes,Ua MODERATE (*)    Bacteria, UA RARE (*)    All other components within normal limits  CBC WITH DIFFERENTIAL/PLATELET - Abnormal; Notable for the following components:  RBC 3.81 (*)    MCV 107.3 (*)    MCH 34.1 (*)    nRBC 0.6 (*)    All other components within normal limits  D-DIMER, QUANTITATIVE - Abnormal; Notable for the following components:   D-Dimer, Quant 2.31 (*)    All other components within normal limits  RESP PANEL BY RT-PCR (RSV, FLU A&B, COVID)  [id number]  CBG MONITORING, ED     I ordered and reviewed the above labs they are notable for blood cell count is normal and D-dimer is elevated 2.3.  EKG  ED ECG REPORT I, Pilar Jarvis, the attending physician, personally viewed and interpreted this ECG.   Date: 06/05/2023  EKG Time: 2105  Rate: 75  Rhythm: sinus  Axis: nl  Intervals: long qtc  ST&T Change: no stemi    RADIOLOGY I independently reviewed and interpreted cxr and see no obvious opacity or pneumothorax I also reviewed radiologist's formal read.   PROCEDURES:  Critical Care performed:  No  Procedures   MEDICATIONS ORDERED IN ED: Medications  cephALEXin (KEFLEX) capsule 500 mg (has no administration in time range)    IMPRESSION / MDM / ASSESSMENT AND PLAN / ED COURSE  I reviewed the triage vital signs and the nursing notes.                                Patient's presentation is most consistent with acute presentation with potential threat to life or bodily function.  Differential diagnosis includes, but is not limited to, viral URI, bacterial pneumonia, urinary tract infection, lymphadenopathy   The patient is on the cardiac monitor to evaluate for evidence of arrhythmia and/or significant heart rate changes.  MDM:    Is a patient with generalized weakness and fatigue in the setting of viral URI symptoms as well as UTI symptoms who looks markedly well, doubt sepsis or meningitis.  Her neck discomfort is likely in the setting of reactive lymphadenopathy as I see no signs of meningismus, marked masses, intraoral abscesses, and will check basic labs, chest x-ray, viral swabs and urinalysis today.   -- Urinalysis with bacteria inflammatory changes suggestive of UTI especially in the setting of fatigue and urinary frequency and foul-smelling urine.  Otherwise appears well nontoxic doubt sepsis, plan for outpatient treatment with antibiotics and PMD follow-up.      FINAL CLINICAL IMPRESSION(S) / ED DIAGNOSES   Final diagnoses:  Other fatigue  Lower urinary tract infectious disease  Viral URI with cough     Rx / DC Orders   ED Discharge Orders          Ordered    cephALEXin (KEFLEX) 500 MG capsule  4 times daily        06/06/23 0111             Note:  This document was prepared using Dragon voice recognition software and may include unintentional dictation errors.    Pilar Jarvis, MD 06/06/23 [phone]

## 2023-06-05 NOTE — ED Triage Notes (Signed)
EMS brings pt in from home for c/o weakness and neck pain

## 2023-06-06 LAB — URINALYSIS, ROUTINE W REFLEX MICROSCOPIC
Bilirubin Urine: NEGATIVE
Glucose, UA: >=500 mg/dL — AB
Ketones, ur: NEGATIVE mg/dL
Nitrite: NEGATIVE
Protein, ur: NEGATIVE mg/dL
Specific Gravity, Urine: 1.015 (ref 1.005–1.030)
pH: 6 (ref 5.0–8.0)

## 2023-06-06 MED ORDER — CEPHALEXIN 500 MG PO CAPS
500.0000 mg | ORAL_CAPSULE | Freq: Once | ORAL | Status: AC
  Administered 2023-06-06: 500 mg via ORAL
  Filled 2023-06-06: qty 1

## 2023-06-06 MED ORDER — CEPHALEXIN 500 MG PO CAPS: 500.0000 mg | ORAL_CAPSULE | Freq: Four times a day (QID) | ORAL | 0 refills | Status: AC

## 2023-06-06 NOTE — Discharge Instructions (Signed)
Take antibiotics for urinary tract infection for the full 5-day course as prescribed.  Thank you for choosing [place] for your health care today!  Please see your primary doctor this week for a follow up appointment.   If you have any new, worsening, or unexpected symptoms call your doctor right away or come back to the emergency department for reevaluation.  It was my pleasure to care for you today.   Daneil Dan Modesto Charon, MD

## 2023-06-20 NOTE — Progress Notes (Unsigned)
ADVANCED HEART FAILURE FOLLOW UP CLINIC NOTE  PCP: Tonia Ghent, MD (last seen earlier this year) Primary Cardiologist: Marcina Millard, MD (last seen 07/24)  HPI:  Ms Wittmeyer is a 77 y/o female with a history of asthma, insignifcant CAD, HTN, aortic atherosclerosis, asthma, GERD, obstructive sleep apnea, RA, PE (05/23), hyperlipidemia, previous tobacco use and chronic heart failure.   Admitted 11/10/22 due to chest pain and SOB due to a/c heart failure. IV diuresed. Myoview stress test done 3/8 Findings: Abnormal myocardial perfusion scan, Severely depressed left ventricular function less than 25%. Midodrine started. Admitted 11/26/22 due to SOB due to a/c heart failure.  Echo 05/03/21: EF of 45-50% with moderate LAE.  Echo 01/29/22: EF of 45-50% along with mild LVH, moderately elevated PA pressure and mild MR. Echo 11/11/22: EF 20-25% with mild LAE, mild MR.    Lexiscan Myoview 11/10/2022 revealed anterolateral and inferolateral scar without ischemia in the absence of chest pain. Cardiac catheterization was deferred.   She presents today for a HF follow-up visit with a chief complaint of minimal fatigue with moderate exertion. Describes this as chronic in nature. Has associated dizziness, bilateral hand / right hip pain and bilateral lower leg swelling along with this. Denies shortness of breath, chest pain, cough, palpitations, abdominal distention, dizziness or difficulty sleeping.  Her rheumatologist had tried stopping her prednisone and in <48 hours, her pain flared. She was just able to resume it today. She did take furosemide today due to swelling in her feet.    Getting ready to visit son in [place] and will be riding in a car. Plans to be there for ~ 1 week. Does not have compression socks.  At last visit, entresto was resumed at BID (she had been taking it daily) and farxiga was tried as she had developed itching with jardiance. Denies having any itching or any issues with farxiga.    ROS: All systems negative except as listed in HPI, PMH and Problem List.  SH:  Social History   Socioeconomic History   Marital status: Widowed    Spouse name: Not on file   Number of children: Not on file   Years of education: Not on file   Highest education level: Not on file  Occupational History   Not on file  Tobacco Use   Smoking status: Former    Current packs/day: 0.00    Types: Cigarettes    Quit date: 01/09/1995    Years since quitting: 28.4   Smokeless tobacco: Never  Vaping Use   Vaping status: Never Used  Substance and Sexual Activity   Alcohol use: No   Drug use: No   Sexual activity: Yes    Birth control/protection: None  Other Topics Concern   Not on file  Social History Narrative   Adult son   Social Determinants of Health   Financial Resource Strain: Medium Risk (10/29/2020)   Received from [facility], [facility]   Overall Financial Resource Strain (CARDIA)    Difficulty of Paying Living Expenses: Somewhat hard  Food Insecurity: No Food Insecurity (11/10/2022)   Hunger Vital Sign    Worried About Running Out of Food in the Last Year: Never true    Ran Out of Food in the Last Year: Never true  Transportation Needs: No Transportation Needs (11/10/2022)   PRAPARE - Administrator, Civil Service (Medical): No    Lack of Transportation (Non-Medical): No  Physical Activity: Unknown (08/21/2017)   Exercise Vital  Sign    Days of Exercise per Week: Patient declined    Minutes of Exercise per Session: Patient declined  Stress: Unknown (08/21/2017)   [employer] of Occupational Health - Occupational Stress Questionnaire    Feeling of Stress : Patient declined  Social Connections: Unknown (08/21/2017)   Social Connection and Isolation Panel [NHANES]    Frequency of Communication with Friends and Family: Patient declined    Frequency of Social Gatherings with Friends and Family: Patient declined    Attends Religious Services:  Patient declined    Database administrator or Organizations: Patient declined    Attends Banker Meetings: Patient declined    Marital Status: Patient declined  Intimate Partner Violence: Not At Risk (11/10/2022)   Humiliation, Afraid, Rape, and Kick questionnaire    Fear of Current or Ex-Partner: No    Emotionally Abused: No    Physically Abused: No    Sexually Abused: No    FH:  Family History  Problem Relation Age of Onset   CAD Mother    Diabetes Mother    Hypertension Mother    CAD Father    Diabetes Father    Hypertension Father    Stroke Father     Past Medical History:  Diagnosis Date   Aortic atherosclerosis (HCC)    Asthma    Chest pain 11/10/2022   CHF (congestive heart failure) (HCC)    Coronary artery disease    Dyspnea    Edema of both legs    GERD (gastroesophageal reflux disease)    HFrEF (heart failure with reduced ejection fraction) (HCC)    History of 2019 novel coronavirus disease (COVID-19) 10/28/2020   Hypertension    Myocardial infarction (HCC)    OSA (obstructive sleep apnea)    noncompliant with nocturnal PAP therapy   RA (rheumatoid arthritis) (HCC)     Current Outpatient Medications  Medication Sig Dispense Refill   acetaminophen (TYLENOL) 650 MG CR tablet Take 1,300 mg by mouth as needed for pain.     albuterol (VENTOLIN HFA) 108 (90 Base) MCG/ACT inhaler Inhale 1-2 puffs into the lungs every 6 (six) hours as needed for wheezing or shortness of breath.     alendronate (FOSAMAX) 70 MG tablet Take 70 mg by mouth once a week. Thursday     apixaban (ELIQUIS) 2.5 MG TABS tablet Take 1 tablet (2.5 mg total) by mouth 2 (two) times daily. 60 tablet 5   atorvastatin (LIPITOR) 40 MG tablet Take 1 tablet (40 mg total) by mouth every morning. 90 tablet 0   carvedilol (COREG) 3.125 MG tablet Take 1 tablet (3.125 mg total) by mouth 2 (two) times daily with a meal. 60 tablet 1   [employer] VITAMIN B12 1000 MCG TBCR Take 1,000 mcg by mouth daily.      dapagliflozin propanediol (FARXIGA) 10 MG TABS tablet Take 1 tablet (10 mg total) by mouth daily before breakfast. 30 tablet 5   fluticasone (FLONASE) 50 MCG/ACT nasal spray Place 2 sprays into the nose daily as needed for allergies.     folic acid (FOLVITE) 1 MG tablet Take 1 mg by mouth daily.     furosemide (LASIX) 20 MG tablet Take 1 tablet (20 mg total) by mouth daily. (Patient taking differently: Take 20 mg by mouth as needed for edema.) 90 tablet 0   hydroxychloroquine (PLAQUENIL) 200 MG tablet Take 200 mg by mouth daily.     methotrexate (RHEUMATREX) 2.5 MG tablet Take 15 mg by mouth  every Monday.     montelukast (SINGULAIR) 10 MG tablet Take 10 mg by mouth at bedtime.     nitroGLYCERIN (NITROSTAT) 0.4 MG SL tablet Place 1 tablet (0.4 mg total) under the tongue every 5 (five) minutes as needed for chest pain. 30 tablet 0   sacubitril-valsartan (ENTRESTO) 49-51 MG Take 1 tablet by mouth 2 (two) times daily. 180 tablet 3   No current facility-administered medications for this visit.   There were no vitals filed for this visit.  Wt Readings from Last 3 Encounters:  06/05/23 175 lb (79.4 kg)  05/10/23 179 lb 9.6 oz (81.5 kg)  04/18/23 176 lb (79.8 kg)   Lab Results  Component Value Date   CREATININE 0.78 06/05/2023   CREATININE 1.01 (H) 05/10/2023   CREATININE 0.90 02/07/2023   PHYSICAL EXAM:  General:  Well appearing. No resp difficulty HEENT: normal Neck: supple. JVP flat. No lymphadenopathy or thryomegaly appreciated. Cor: PMI normal. Regular rate & rhythm. No rubs, gallops or murmurs. Lungs: clear Abdomen: soft, nontender, nondistended. No hepatosplenomegaly. No bruits or masses.  Extremities: no cyanosis, clubbing, fingers deformed due to RA, trace pitting bilaterally Neuro: alert & oriented x3, cranial nerves grossly intact. Moves all 4 extremities w/o difficulty. Affect pleasant.   ECG: not done   ASSESSMENT & PLAN:  1: NICM with reduced ejection fraction- -  likely due to HTN/ PE or untreated OSA - NYHA class II - euvolemic today - weighing daily; reminded to call for an overnight weight gain of > 2 pounds or a weekly weight gain of > 5 pounds - weight up 3 pounds from last visit here 3 weeks ago - Echo 05/03/21: EF of 45-50% with moderate LAE.  - Echo 01/29/22: EF of 45-50% along with mild LVH, moderately elevated PA pressure and mild MR. - Echo 11/11/22: EF 20-25% with mild LAE, mild MR. Eugenie Birks Myoview 11/10/2022 revealed anterolateral and inferolateral scar without ischemia in the absence of chest pain. Cardiac catheterization was deferred.  - not adding salt and does read food labels for sodium content - continue entresto 24/26mg  BID; plan to titrate this to 49/51mg  BID after lab results and after she returns from [place]; if titrate this, BMP at next visit - continue carvedilol 3.125mg  BID  - continue spironolactone 12.5mg  daily - continue furosemide 20mg  daily PRN - continue farxiga 10mg  daily - BMP today - reviewed no more than 2L of fluid daily - once GDMT is as optimized as possible, will update echo - saw cardiology ([facility]) 07/24 - encouraged her to get compression socks for the ride to [place] and elevate her legs when she gets there - BNP 11/26/22 was 2966.8  2: HTN- - BP 136/71 - sees PCP ([facility])  - BMP 03/05/23 reviewed and showed sodium 142, potassium 3.9, creatinine 1.1 & GFR 51 - BMP today  3: Obstructive sleep apnea- - does not wear CPAP - wearing 2L oxygen at bedtime and PRN during the day  4: RA- - saw rheumatologist Allena Katz) 08/24 - had prednisone stopped but then RA flared and she just resumed it  5: PE- - saw hematology Cathie Hoops) 06/24 - continue apixaban 2.5mg  BID - 2L oxygen at bedtime and PRN during the day  Return in 6 weeks, sooner if needed.

## 2023-06-21 ENCOUNTER — Encounter: Payer: Self-pay | Admitting: Family

## 2023-06-21 ENCOUNTER — Ambulatory Visit: Payer: 59 | Admitting: Family

## 2023-06-21 ENCOUNTER — Other Ambulatory Visit
Admission: RE | Admit: 2023-06-21 | Discharge: 2023-06-21 | Disposition: A | Discharge status: Home / Self Care | Payer: 59 | Source: Ambulatory Visit | Attending: Family | Admitting: Family

## 2023-06-21 VITALS — BP 149/72 | HR 81 | Wt 181.0 lb

## 2023-06-21 DIAGNOSIS — I10 Essential (primary) hypertension: Secondary | ICD-10-CM

## 2023-06-21 DIAGNOSIS — I5022 Chronic systolic (congestive) heart failure: Secondary | ICD-10-CM | POA: Diagnosis present

## 2023-06-21 DIAGNOSIS — M069 Rheumatoid arthritis, unspecified: Secondary | ICD-10-CM | POA: Diagnosis not present

## 2023-06-21 DIAGNOSIS — I2782 Chronic pulmonary embolism: Secondary | ICD-10-CM

## 2023-06-21 DIAGNOSIS — G4733 Obstructive sleep apnea (adult) (pediatric): Secondary | ICD-10-CM | POA: Diagnosis not present

## 2023-06-21 LAB — BASIC METABOLIC PANEL
Anion gap: 10 (ref 5–15)
BUN: 18 mg/dL (ref 8–23)
CO2: 25 mmol/L (ref 22–32)
Calcium: 8.5 mg/dL — ABNORMAL LOW (ref 8.9–10.3)
Chloride: 106 mmol/L (ref 98–111)
Creatinine, Ser: 0.79 mg/dL (ref 0.44–1.00)
GFR, Estimated: >60 mL/min (ref >60)
Glucose, Bld: 127 mg/dL — ABNORMAL HIGH (ref 70–99)
Potassium: 3.5 mmol/L (ref 3.5–5.1)
Sodium: 141 mmol/L (ref 135–145)

## 2023-06-21 MED ORDER — CARVEDILOL 6.25 MG PO TABS: 6.2500 mg | ORAL_TABLET | Freq: Two times a day (BID) | ORAL | 1 refills | Status: DC

## 2023-06-21 NOTE — Patient Instructions (Addendum)
INCREASE YOUR CARVEDILOL (COREG) TO 6.25 MG TWICE DAILY  Go over to the [facility]. Go pass the gift shop and have your blood work completed.

## 2023-07-13 ENCOUNTER — Encounter: Payer: 59 | Admitting: Family

## 2023-07-13 NOTE — Progress Notes (Deleted)
ADVANCED HEART FAILURE FOLLOW UP CLINIC NOTE  PCP: Tonia Ghent, MD (last seen earlier this year) Primary Cardiologist: Marcina Millard, MD (last seen 07/24)  HPI:  [name] is a 77 y/o female with a history of asthma, insignifcant CAD, HTN, aortic atherosclerosis, asthma, GERD, obstructive sleep apnea, RA, PE (05/23), hyperlipidemia, previous tobacco use and chronic heart failure.   Admitted 11/10/22 due to chest pain and SOB due to a/c heart failure. IV diuresed. Myoview stress test done 3/8 Findings: Abnormal myocardial perfusion scan, Severely depressed left ventricular function less than 25%. Midodrine started. Admitted 11/26/22 due to SOB due to a/c heart failure. Was in the ED 06/05/23 with generalized weakness, fatigue, sinus congestion, cough, and urinary frequency and foul-smelling urine for the last 2 to 3 days along withbilateral submandibular tenderness. Found to have UTI and antibiotics started.     Echo 05/03/21: EF of 45-50% with moderate LAE.  Echo 01/29/22: EF of 45-50% along with mild LVH, moderately elevated PA pressure and mild MR. Echo 11/11/22: EF 20-25% with mild LAE, mild MR.    Lexiscan Myoview 11/10/2022 revealed anterolateral and inferolateral scar without ischemia in the absence of chest pain. Cardiac catheterization was deferred.   She presents today for a HF follow-up visit with a chief complaint of minimal shortness of breath with moderate exertion. Has associated occasional dizziness with sudden position changes, cough and difficulty sleeping (although improving). Denies chest pain, palpitations, abdominal distention or pedal edema. Notes that her appetite is "too good" because of chronic prednisone that she needs for her RA.   Since last visit, entresto was increased to 49/51mg  BID & no difficulty in taking this that she is aware of. She had stopped taking the spironolactone because of not wanting to cut in half .   ROS: All systems negative except as listed in  HPI, PMH and Problem List.  SH:  Social History   Socioeconomic History   Marital status: Widowed    Spouse name: Not on file   Number of children: Not on file   Years of education: Not on file   Highest education level: Not on file  Occupational History   Not on file  Tobacco Use   Smoking status: Former    Current packs/day: 0.00    Types: Cigarettes    Quit date: 01/09/1995    Years since quitting: 28.5   Smokeless tobacco: Never  Vaping Use   Vaping status: Never Used  Substance and Sexual Activity   Alcohol use: No   Drug use: No   Sexual activity: Yes    Birth control/protection: None  Other Topics Concern   Not on file  Social History Narrative   Adult son   Social Determinants of Health   Financial Resource Strain: Medium Risk (10/29/2020)   Received from [facility], [facility]   Overall Financial Resource Strain (CARDIA)    Difficulty of Paying Living Expenses: Somewhat hard  Food Insecurity: No Food Insecurity (11/10/2022)   Hunger Vital Sign    Worried About Running Out of Food in the Last Year: Never true    Ran Out of Food in the Last Year: Never true  Transportation Needs: No Transportation Needs (11/10/2022)   PRAPARE - Administrator, Civil Service (Medical): No    Lack of Transportation (Non-Medical): No  Physical Activity: Unknown (08/21/2017)   Exercise Vital Sign    Days of Exercise per Week: Patient declined    Minutes of Exercise per  Session: Patient declined  Stress: Unknown (08/21/2017)   [employer] of Occupational Health - Occupational Stress Questionnaire    Feeling of Stress : Patient declined  Social Connections: Unknown (08/21/2017)   Social Connection and Isolation Panel [NHANES]    Frequency of Communication with Friends and Family: Patient declined    Frequency of Social Gatherings with Friends and Family: Patient declined    Attends Religious Services: Patient declined    Database administrator or  Organizations: Patient declined    Attends Banker Meetings: Patient declined    Marital Status: Patient declined  Intimate Partner Violence: Not At Risk (11/10/2022)   Humiliation, Afraid, Rape, and Kick questionnaire    Fear of Current or Ex-Partner: No    Emotionally Abused: No    Physically Abused: No    Sexually Abused: No    FH:  Family History  Problem Relation Age of Onset   CAD Mother    Diabetes Mother    Hypertension Mother    CAD Father    Diabetes Father    Hypertension Father    Stroke Father     Past Medical History:  Diagnosis Date   Aortic atherosclerosis (HCC)    Asthma    Chest pain 11/10/2022   CHF (congestive heart failure) (HCC)    Coronary artery disease    Dyspnea    Edema of both legs    GERD (gastroesophageal reflux disease)    HFrEF (heart failure with reduced ejection fraction) (HCC)    History of 2019 novel coronavirus disease (COVID-19) 10/28/2020   Hypertension    Myocardial infarction (HCC)    OSA (obstructive sleep apnea)    noncompliant with nocturnal PAP therapy   RA (rheumatoid arthritis) (HCC)     Current Outpatient Medications  Medication Sig Dispense Refill   acetaminophen (TYLENOL) 650 MG CR tablet Take 1,300 mg by mouth as needed for pain.     albuterol (VENTOLIN HFA) 108 (90 Base) MCG/ACT inhaler Inhale 1-2 puffs into the lungs every 6 (six) hours as needed for wheezing or shortness of breath.     alendronate (FOSAMAX) 70 MG tablet Take 70 mg by mouth once a week. Thursday (Patient not taking: Reported on 06/21/2023)     apixaban (ELIQUIS) 2.5 MG TABS tablet Take 1 tablet (2.5 mg total) by mouth 2 (two) times daily. 60 tablet 5   atorvastatin (LIPITOR) 40 MG tablet Take 1 tablet (40 mg total) by mouth every morning. 90 tablet 0   carvedilol (COREG) 6.25 MG tablet Take 1 tablet (6.25 mg total) by mouth 2 (two) times daily. 180 tablet 1   [employer]S VITAMIN B12 1000 MCG TBCR Take 1,000 mcg by mouth daily.     dapagliflozin  propanediol (FARXIGA) 10 MG TABS tablet Take 1 tablet (10 mg total) by mouth daily before breakfast. 30 tablet 5   fluticasone (FLONASE) 50 MCG/ACT nasal spray Place 2 sprays into the nose daily as needed for allergies.     folic acid (FOLVITE) 1 MG tablet Take 1 mg by mouth daily.     furosemide (LASIX) 20 MG tablet Take 1 tablet (20 mg total) by mouth daily. (Patient not taking: Reported on 06/21/2023) 90 tablet 0   methotrexate (RHEUMATREX) 2.5 MG tablet Take 15 mg by mouth every Monday.     montelukast (SINGULAIR) 10 MG tablet Take 10 mg by mouth at bedtime.     nitroGLYCERIN (NITROSTAT) 0.4 MG SL tablet Place 1 tablet (0.4 mg total) under the  tongue every 5 (five) minutes as needed for chest pain. 30 tablet 0   predniSONE (DELTASONE) 5 MG tablet Take 5 mg by mouth daily with breakfast.     sacubitril-valsartan (ENTRESTO) 49-51 MG Take 1 tablet by mouth 2 (two) times daily. 180 tablet 3   No current facility-administered medications for this visit.   There were no vitals filed for this visit.  Wt Readings from Last 3 Encounters:  06/21/23 181 lb (82.1 kg)  06/05/23 175 lb (79.4 kg)  05/10/23 179 lb 9.6 oz (81.5 kg)   Lab Results  Component Value Date   CREATININE 0.79 06/21/2023   CREATININE 0.78 06/05/2023   CREATININE 1.01 (H) 05/10/2023   PHYSICAL EXAM:  General:  Well appearing. No resp difficulty HEENT: normal Neck: supple. JVP flat. No lymphadenopathy or thryomegaly appreciated. Cor: PMI normal. Regular rate & rhythm. No rubs, gallops or murmurs. Lungs: clear Abdomen: soft, nontender, nondistended. No hepatosplenomegaly. No bruits or masses.  Extremities: no cyanosis, clubbing, fingers deformed due to RA, trace pitting bilaterally Neuro: alert & oriented x3, cranial nerves grossly intact. Moves all 4 extremities w/o difficulty. Affect pleasant.   ECG: not done   ASSESSMENT & PLAN:  1: NICM with reduced ejection fraction- - likely due to HTN/ PE or untreated OSA -  NYHA class II - euvolemic today - weighing daily; reminded to call for an overnight weight gain of > 2 pounds or a weekly weight gain of > 5 pounds - weight up 2 pounds from last visit here 3 weeks ago - Echo 05/03/21: EF of 45-50% with moderate LAE.  - Echo 01/29/22: EF of 45-50% along with mild LVH, moderately elevated PA pressure and mild MR. - Echo 11/11/22: EF 20-25% with mild LAE, mild MR. Eugenie Birks Myoview 11/10/2022 revealed anterolateral and inferolateral scar without ischemia in the absence of chest pain. Cardiac catheterization was deferred.  - not adding salt and does read food labels for sodium content - continue entresto 49/51mg  BID - BMP today - increase carvedilol to 6.25mg  BID  - continue furosemide 20mg  daily PRN (hasn't had to take this in "awhile") - continue farxiga 10mg  daily - would like to resume spironolactone at next visit if she's agreeable (she had stopped it because of not wanting to cut it in half) - reviewed no more than 2L of fluid daily - once GDMT is as optimized as possible, will update echo; may need cMRI in future  - she has previously deferred catheterization - saw cardiology ([facility]) 07/24 - BNP 11/26/22 was 2966.8  2: HTN- - BP 149/75 (increasing entresto per above) - sees PCP ([facility] @ [facility])  - BMP 06/05/23 reviewed and showed sodium 139, potassium 3.8, creatinine 0.78 & GFR >60 - BMP today  3: Obstructive sleep apnea- - does not wear CPAP - wearing 2L oxygen at bedtime and PRN during the day  4: RA- - saw rheumatologist Allena Katz) 08/24 - continue daily prednisone  5: PE- - saw hematology Cathie Hoops) 06/24 - continue apixaban 2.5mg  BID - 2L oxygen at bedtime and PRN during the day  Return in 1 month, sooner if needed.

## 2023-07-19 ENCOUNTER — Encounter: Payer: Self-pay | Admitting: Family

## 2023-07-19 ENCOUNTER — Ambulatory Visit: Payer: 59 | Attending: Family | Admitting: Family

## 2023-07-19 VITALS — BP 126/77 | HR 63 | Ht 66.0 in | Wt 180.0 lb

## 2023-07-19 DIAGNOSIS — Z7984 Long term (current) use of oral hypoglycemic drugs: Secondary | ICD-10-CM | POA: Insufficient documentation

## 2023-07-19 DIAGNOSIS — I2782 Chronic pulmonary embolism: Secondary | ICD-10-CM | POA: Insufficient documentation

## 2023-07-19 DIAGNOSIS — J45909 Unspecified asthma, uncomplicated: Secondary | ICD-10-CM | POA: Diagnosis not present

## 2023-07-19 DIAGNOSIS — Z7952 Long term (current) use of systemic steroids: Secondary | ICD-10-CM | POA: Diagnosis not present

## 2023-07-19 DIAGNOSIS — R0602 Shortness of breath: Secondary | ICD-10-CM | POA: Diagnosis not present

## 2023-07-19 DIAGNOSIS — R5383 Other fatigue: Secondary | ICD-10-CM | POA: Insufficient documentation

## 2023-07-19 DIAGNOSIS — Z87891 Personal history of nicotine dependence: Secondary | ICD-10-CM | POA: Insufficient documentation

## 2023-07-19 DIAGNOSIS — M069 Rheumatoid arthritis, unspecified: Secondary | ICD-10-CM | POA: Insufficient documentation

## 2023-07-19 DIAGNOSIS — Z9981 Dependence on supplemental oxygen: Secondary | ICD-10-CM | POA: Insufficient documentation

## 2023-07-19 DIAGNOSIS — L905 Scar conditions and fibrosis of skin: Secondary | ICD-10-CM | POA: Insufficient documentation

## 2023-07-19 DIAGNOSIS — Z79899 Other long term (current) drug therapy: Secondary | ICD-10-CM | POA: Diagnosis not present

## 2023-07-19 DIAGNOSIS — I2510 Atherosclerotic heart disease of native coronary artery without angina pectoris: Secondary | ICD-10-CM | POA: Diagnosis not present

## 2023-07-19 DIAGNOSIS — E785 Hyperlipidemia, unspecified: Secondary | ICD-10-CM | POA: Insufficient documentation

## 2023-07-19 DIAGNOSIS — I5022 Chronic systolic (congestive) heart failure: Secondary | ICD-10-CM | POA: Diagnosis not present

## 2023-07-19 DIAGNOSIS — G4733 Obstructive sleep apnea (adult) (pediatric): Secondary | ICD-10-CM | POA: Insufficient documentation

## 2023-07-19 DIAGNOSIS — G8929 Other chronic pain: Secondary | ICD-10-CM | POA: Diagnosis not present

## 2023-07-19 DIAGNOSIS — I110 Hypertensive heart disease with heart failure: Secondary | ICD-10-CM | POA: Diagnosis not present

## 2023-07-19 DIAGNOSIS — K219 Gastro-esophageal reflux disease without esophagitis: Secondary | ICD-10-CM | POA: Diagnosis not present

## 2023-07-19 DIAGNOSIS — I428 Other cardiomyopathies: Secondary | ICD-10-CM | POA: Diagnosis not present

## 2023-07-19 DIAGNOSIS — I10 Essential (primary) hypertension: Secondary | ICD-10-CM | POA: Diagnosis not present

## 2023-07-19 NOTE — Patient Instructions (Addendum)
Go DOWN to LOWER LEVEL (LL) to have your blood work completed inside of [employer] office.  We will call only if results are abnormal.

## 2023-07-19 NOTE — Progress Notes (Signed)
ADVANCED HEART FAILURE FOLLOW UP CLINIC NOTE  PCP: Tonia Ghent, MD (last seen earlier this year) Primary Cardiologist: Marcina Millard, MD (last seen 07/24)  HPI:  Ms Corner is a 77 y/o female with a history of asthma, insignifcant CAD, HTN, aortic atherosclerosis, asthma, GERD, obstructive sleep apnea, RA, PE (05/23), hyperlipidemia, previous tobacco use and chronic heart failure.   Admitted 11/10/22 due to chest pain and SOB due to a/c heart failure. IV diuresed. Myoview stress test done 3/8 Findings: Abnormal myocardial perfusion scan, Severely depressed left ventricular function less than 25%. Midodrine started. Admitted 11/26/22 due to SOB due to a/c heart failure. Was in the ED 06/05/23 with generalized weakness, fatigue, sinus congestion, cough, and urinary frequency and foul-smelling urine for the last 2 to 3 days along withbilateral submandibular tenderness. Found to have UTI and antibiotics started.     Echo 05/03/21: EF of 45-50% with moderate LAE.  Echo 01/29/22: EF of 45-50% along with mild LVH, moderately elevated PA pressure and mild MR. Echo 11/11/22: EF 20-25% with mild LAE, mild MR.    Lexiscan Myoview 11/10/2022 revealed anterolateral and inferolateral scar without ischemia in the absence of chest pain. Cardiac catheterization was deferred.   She presents today for a HF follow-up visit with a chief complaint of moderate fatigue with minimal exertion. Says that she's very tired today because she didn't sleep well last night but she's unsure of what was keeping her up. Has associated shortness of breath, puffy eyelids, productve cough and chronic joint pain along with this. Denies chest pain, palpitations, abdominal distention, pedal edema, dizziness or weight gain. Denies feeling nauseous, fever or being around anyone that has been sick that she is aware of. Says that she just "doesn't feel good today"  At last visit, carvedilol increased to 6.25mg  BID and she has been tolerating  this without any issues that she's aware of.   ROS: All systems negative except as listed in HPI, PMH and Problem List.  SH:  Social History   Socioeconomic History   Marital status: Widowed    Spouse name: Not on file   Number of children: Not on file   Years of education: Not on file   Highest education level: Not on file  Occupational History   Not on file  Tobacco Use   Smoking status: Former    Current packs/day: 0.00    Types: Cigarettes    Quit date: 01/09/1995    Years since quitting: 28.5   Smokeless tobacco: Never  Vaping Use   Vaping status: Never Used  Substance and Sexual Activity   Alcohol use: No   Drug use: No   Sexual activity: Yes    Birth control/protection: None  Other Topics Concern   Not on file  Social History Narrative   Adult son   Social Determinants of Health   Financial Resource Strain: Medium Risk (10/29/2020)   Received from [facility], [facility]   Overall Financial Resource Strain (CARDIA)    Difficulty of Paying Living Expenses: Somewhat hard  Food Insecurity: No Food Insecurity (11/10/2022)   Hunger Vital Sign    Worried About Running Out of Food in the Last Year: Never true    Ran Out of Food in the Last Year: Never true  Transportation Needs: No Transportation Needs (11/10/2022)   PRAPARE - Administrator, Civil Service (Medical): No    Lack of Transportation (Non-Medical): No  Physical Activity: Unknown (08/21/2017)   Exercise Vital  Sign    Days of Exercise per Week: Patient declined    Minutes of Exercise per Session: Patient declined  Stress: Unknown (08/21/2017)   [employer] of Occupational Health - Occupational Stress Questionnaire    Feeling of Stress : Patient declined  Social Connections: Unknown (08/21/2017)   Social Connection and Isolation Panel [NHANES]    Frequency of Communication with Friends and Family: Patient declined    Frequency of Social Gatherings with Friends and Family: Patient  declined    Attends Religious Services: Patient declined    Database administrator or Organizations: Patient declined    Attends Banker Meetings: Patient declined    Marital Status: Patient declined  Intimate Partner Violence: Not At Risk (11/10/2022)   Humiliation, Afraid, Rape, and Kick questionnaire    Fear of Current or Ex-Partner: No    Emotionally Abused: No    Physically Abused: No    Sexually Abused: No    FH:  Family History  Problem Relation Age of Onset   CAD Mother    Diabetes Mother    Hypertension Mother    CAD Father    Diabetes Father    Hypertension Father    Stroke Father     Past Medical History:  Diagnosis Date   Aortic atherosclerosis (HCC)    Asthma    Chest pain 11/10/2022   CHF (congestive heart failure) (HCC)    Coronary artery disease    Dyspnea    Edema of both legs    GERD (gastroesophageal reflux disease)    HFrEF (heart failure with reduced ejection fraction) (HCC)    History of 2019 novel coronavirus disease (COVID-19) 10/28/2020   Hypertension    Myocardial infarction (HCC)    OSA (obstructive sleep apnea)    noncompliant with nocturnal PAP therapy   RA (rheumatoid arthritis) (HCC)     Current Outpatient Medications  Medication Sig Dispense Refill   acetaminophen (TYLENOL) 650 MG CR tablet Take 1,300 mg by mouth as needed for pain.     albuterol (VENTOLIN HFA) 108 (90 Base) MCG/ACT inhaler Inhale 1-2 puffs into the lungs every 6 (six) hours as needed for wheezing or shortness of breath.     alendronate (FOSAMAX) 70 MG tablet Take 70 mg by mouth once a week. Thursday (Patient not taking: Reported on 06/21/2023)     apixaban (ELIQUIS) 2.5 MG TABS tablet Take 1 tablet (2.5 mg total) by mouth 2 (two) times daily. 60 tablet 5   atorvastatin (LIPITOR) 40 MG tablet Take 1 tablet (40 mg total) by mouth every morning. 90 tablet 0   carvedilol (COREG) 6.25 MG tablet Take 1 tablet (6.25 mg total) by mouth 2 (two) times daily. 180  tablet 1   [employer] VITAMIN B12 1000 MCG TBCR Take 1,000 mcg by mouth daily.     dapagliflozin propanediol (FARXIGA) 10 MG TABS tablet Take 1 tablet (10 mg total) by mouth daily before breakfast. 30 tablet 5   fluticasone (FLONASE) 50 MCG/ACT nasal spray Place 2 sprays into the nose daily as needed for allergies.     folic acid (FOLVITE) 1 MG tablet Take 1 mg by mouth daily.     furosemide (LASIX) 20 MG tablet Take 1 tablet (20 mg total) by mouth daily. (Patient not taking: Reported on 06/21/2023) 90 tablet 0   methotrexate (RHEUMATREX) 2.5 MG tablet Take 15 mg by mouth every Monday.     montelukast (SINGULAIR) 10 MG tablet Take 10 mg by mouth at bedtime.  nitroGLYCERIN (NITROSTAT) 0.4 MG SL tablet Place 1 tablet (0.4 mg total) under the tongue every 5 (five) minutes as needed for chest pain. 30 tablet 0   predniSONE (DELTASONE) 5 MG tablet Take 5 mg by mouth daily with breakfast.     sacubitril-valsartan (ENTRESTO) 49-51 MG Take 1 tablet by mouth 2 (two) times daily. 180 tablet 3   No current facility-administered medications for this visit.   Vitals:   07/19/23 1055  BP: 126/77  Pulse: 63  SpO2: 95%  Weight: 180 lb (81.6 kg)  Height: 5\' 6"  (1.676 m)   Wt Readings from Last 3 Encounters:  07/19/23 180 lb (81.6 kg)  06/21/23 181 lb (82.1 kg)  06/05/23 175 lb (79.4 kg)   Lab Results  Component Value Date   CREATININE 0.79 06/21/2023   CREATININE 0.78 06/05/2023   CREATININE 1.01 (H) 05/10/2023   PHYSICAL EXAM:  General:  Well appearing although looks tired. No resp difficulty HEENT: upper eyelids appear puffy Neck: supple. JVP flat. No lymphadenopathy or thryomegaly appreciated. Cor: PMI normal. Regular rate & rhythm. No rubs, gallops or murmurs. Lungs: clear Abdomen: soft, nontender, nondistended. No hepatosplenomegaly. No bruits or masses.  Extremities: no cyanosis, clubbing, fingers deformed due to RA, trace pitting bilaterally around ankles Neuro: alert & oriented x3,  cranial nerves grossly intact. Moves all 4 extremities w/o difficulty. Affect pleasant.   ECG: not done   ASSESSMENT & PLAN:  1: NICM with reduced ejection fraction- - likely due to HTN/ PE or untreated OSA - NYHA class III - euvolemic today - weighing daily; reminded to call for an overnight weight gain of > 2 pounds or a weekly weight gain of > 5 pounds - weight stable from last visit here 1 month ago - unable to obtain ReDs reading due to machine not coming on - Echo 05/03/21: EF of 45-50% with moderate LAE.  - Echo 01/29/22: EF of 45-50% along with mild LVH, moderately elevated PA pressure and mild MR. - Echo 11/11/22: EF 20-25% with mild LAE, mild MR. Eugenie Birks Myoview 11/10/2022 revealed anterolateral and inferolateral scar without ischemia in the absence of chest pain. Cardiac catheterization was deferred.  - not adding salt and does read food labels for sodium content - continue carvedilol 6.25mg  BID  - continue farxiga 10mg  daily - continue furosemide 20mg  daily PRN (hasn't had to take this in "awhile") - continue entresto 49/51mg  BID - would like to resume spironolactone but will defer as she doesn't feel well today - BMET/CBC today - reviewed no more than 2L of fluid daily - once GDMT is as optimized as possible, will update echo; may need cMRI in future  - she has previously deferred catheterization - saw cardiology (Paraschos) 07/24 - BNP 11/26/22 was 2966.8  2: HTN- - BP 126/77 - sees PCP ([facility])  - BMP 06/21/23 reviewed and showed sodium 141, potassium 3.5, creatinine 0.79 & GFR >60  3: Obstructive sleep apnea- - does not wear CPAP - wearing 2L oxygen at bedtime and PRN during the day  4: RA- - saw rheumatologist Allena Katz) 08/24 - continue daily prednisone 5mg  daily  5: PE- - saw hematology Cathie Hoops) 06/24 - continue apixaban 2.5mg  BID - 2L oxygen at bedtime and PRN during the day - BMET/CBC today   Return in 1 month, sooner if needed. If  continues to not feel well, follow-up with PCP or if anything worsens, present to the ED.

## 2023-07-20 LAB — BASIC METABOLIC PANEL
BUN/Creatinine Ratio: 20 (ref 12–28)
BUN: 15 mg/dL (ref 8–27)
CO2: 22 mmol/L (ref 20–29)
Calcium: 8.7 mg/dL (ref 8.7–10.3)
Chloride: 109 mmol/L — ABNORMAL HIGH (ref 96–106)
Creatinine, Ser: 0.76 mg/dL (ref 0.57–1.00)
Glucose: 127 mg/dL — ABNORMAL HIGH (ref 70–99)
Potassium: 3.7 mmol/L (ref 3.5–5.2)
Sodium: 144 mmol/L (ref 134–144)
eGFR: 80 mL/min/{1.73_m2} (ref >59)

## 2023-07-20 LAB — CBC
Hematocrit: 36.3 % (ref 34.0–46.6)
Hemoglobin: 11.7 g/dL (ref 11.1–15.9)
MCH: 33.1 pg — ABNORMAL HIGH (ref 26.6–33.0)
MCHC: 32.2 g/dL (ref 31.5–35.7)
MCV: 103 fL — ABNORMAL HIGH (ref 79–97)
Platelets: 201 10*3/uL (ref 150–450)
RBC: 3.54 x10E6/uL — ABNORMAL LOW (ref 3.77–5.28)
RDW: 13.1 % (ref 11.7–15.4)
WBC: 4.8 10*3/uL (ref 3.4–10.8)

## 2023-08-09 ENCOUNTER — Inpatient Hospital Stay: Payer: 59

## 2023-08-09 ENCOUNTER — Inpatient Hospital Stay: Payer: 59 | Admitting: Oncology

## 2023-08-13 ENCOUNTER — Other Ambulatory Visit: Payer: Self-pay

## 2023-08-15 ENCOUNTER — Inpatient Hospital Stay: Payer: 59 | Attending: Oncology

## 2023-08-15 ENCOUNTER — Telehealth: Payer: Self-pay | Admitting: Family

## 2023-08-15 ENCOUNTER — Inpatient Hospital Stay (HOSPITAL_BASED_OUTPATIENT_CLINIC_OR_DEPARTMENT_OTHER): Payer: 59 | Admitting: Oncology

## 2023-08-15 ENCOUNTER — Encounter: Payer: Self-pay | Admitting: Oncology

## 2023-08-15 VITALS — BP 150/87 | HR 77 | Temp 96.2°F | Resp 18 | Wt 176.9 lb

## 2023-08-15 DIAGNOSIS — J449 Chronic obstructive pulmonary disease, unspecified: Secondary | ICD-10-CM | POA: Diagnosis not present

## 2023-08-15 DIAGNOSIS — E876 Hypokalemia: Secondary | ICD-10-CM | POA: Diagnosis not present

## 2023-08-15 DIAGNOSIS — R0602 Shortness of breath: Secondary | ICD-10-CM | POA: Insufficient documentation

## 2023-08-15 DIAGNOSIS — Z87891 Personal history of nicotine dependence: Secondary | ICD-10-CM | POA: Diagnosis not present

## 2023-08-15 DIAGNOSIS — D7589 Other specified diseases of blood and blood-forming organs: Secondary | ICD-10-CM | POA: Insufficient documentation

## 2023-08-15 DIAGNOSIS — Z7901 Long term (current) use of anticoagulants: Secondary | ICD-10-CM | POA: Diagnosis not present

## 2023-08-15 DIAGNOSIS — Z8701 Personal history of pneumonia (recurrent): Secondary | ICD-10-CM | POA: Insufficient documentation

## 2023-08-15 DIAGNOSIS — Z801 Family history of malignant neoplasm of trachea, bronchus and lung: Secondary | ICD-10-CM | POA: Diagnosis not present

## 2023-08-15 DIAGNOSIS — Z86711 Personal history of pulmonary embolism: Secondary | ICD-10-CM | POA: Insufficient documentation

## 2023-08-15 LAB — CBC WITH DIFFERENTIAL (CANCER CENTER ONLY)
Abs Immature Granulocytes: 0.01 10*3/uL (ref 0.00–0.07)
Basophils Absolute: 0 10*3/uL (ref 0.0–0.1)
Basophils Relative: 1 %
Eosinophils Absolute: 0 10*3/uL (ref 0.0–0.5)
Eosinophils Relative: 0 %
HCT: 40.3 % (ref 36.0–46.0)
Hemoglobin: 12.8 g/dL (ref 12.0–15.0)
Immature Granulocytes: 0 %
Lymphocytes Relative: 22 %
Lymphs Abs: 0.9 10*3/uL (ref 0.7–4.0)
MCH: 32.8 pg (ref 26.0–34.0)
MCHC: 31.8 g/dL (ref 30.0–36.0)
MCV: 103.3 fL — ABNORMAL HIGH (ref 80.0–100.0)
Monocytes Absolute: 0.4 10*3/uL (ref 0.1–1.0)
Monocytes Relative: 9 %
Neutro Abs: 3 10*3/uL (ref 1.7–7.7)
Neutrophils Relative %: 68 %
Platelet Count: 207 10*3/uL (ref 150–400)
RBC: 3.9 MIL/uL (ref 3.87–5.11)
RDW: 15.3 % (ref 11.5–15.5)
WBC Count: 4.3 10*3/uL (ref 4.0–10.5)
nRBC: 0 % (ref 0.0–0.2)

## 2023-08-15 LAB — CMP (CANCER CENTER ONLY)
ALT: 12 U/L (ref 0–44)
AST: 12 U/L — ABNORMAL LOW (ref 15–41)
Albumin: 3.2 g/dL — ABNORMAL LOW (ref 3.5–5.0)
Alkaline Phosphatase: 57 U/L (ref 38–126)
Anion gap: 6 (ref 5–15)
BUN: 19 mg/dL (ref 8–23)
CO2: 28 mmol/L (ref 22–32)
Calcium: 8.4 mg/dL — ABNORMAL LOW (ref 8.9–10.3)
Chloride: 109 mmol/L (ref 98–111)
Creatinine: 0.78 mg/dL (ref 0.44–1.00)
GFR, Estimated: >60 mL/min (ref >60)
Glucose, Bld: 120 mg/dL — ABNORMAL HIGH (ref 70–99)
Potassium: 2.9 mmol/L — ABNORMAL LOW (ref 3.5–5.1)
Sodium: 143 mmol/L (ref 135–145)
Total Bilirubin: 1.3 mg/dL — ABNORMAL HIGH (ref <1.2)
Total Protein: 6.5 g/dL (ref 6.5–8.1)

## 2023-08-15 LAB — VITAMIN B12: Vitamin B-12: 3038 pg/mL — ABNORMAL HIGH (ref 180–914)

## 2023-08-15 LAB — FOLATE: Folate: 16.8 ng/mL (ref >5.9)

## 2023-08-15 MED ORDER — APIXABAN 2.5 MG PO TABS: 2.5000 mg | ORAL_TABLET | Freq: Two times a day (BID) | ORAL | 5 refills | Status: DC

## 2023-08-15 MED ORDER — POTASSIUM CHLORIDE CRYS ER 20 MEQ PO TBCR: 20.0000 meq | EXTENDED_RELEASE_TABLET | Freq: Two times a day (BID) | ORAL | 0 refills | Status: AC

## 2023-08-15 NOTE — Assessment & Plan Note (Addendum)
Unprovoked pulmonary embolism She has finished 6 months of therapeutic anticoagulation. hypercoagulable work up negative.  Continue Eliquis 2.5mg  BID.  Prescription was sent to pharmacy. Recommend age appropriate cancer screening.

## 2023-08-15 NOTE — Progress Notes (Addendum)
ADVANCED HEART FAILURE FOLLOW UP CLINIC NOTE   PCP: Tonia Ghent, MD  Primary Cardiologist: Marcina Millard, MD (last seen 07/24)  HPI:  Tina Fields is a 77 y/o female with a history of asthma, insignifcant CAD, HTN, aortic atherosclerosis, asthma, GERD, obstructive sleep apnea, RA, PE (05/23), hyperlipidemia, previous tobacco use and chronic heart failure.   Admitted 11/10/22 due to chest pain and SOB due to a/c heart failure. IV diuresed. Myoview stress test done 3/8 Findings: Abnormal myocardial perfusion scan, Severely depressed left ventricular function less than 25%. Midodrine started. Admitted 11/26/22 due to SOB due to a/c heart failure. Was in the ED 06/05/23 with generalized weakness, fatigue, sinus congestion, cough, and urinary frequency and foul-smelling urine for the last 2 to 3 days along withbilateral submandibular tenderness. Found to have UTI and antibiotics started.     Echo 05/03/21: EF of 45-50% with moderate LAE.  Echo 01/29/22: EF of 45-50% along with mild LVH, moderately elevated PA pressure and mild MR. Echo 11/11/22: EF 20-25% with mild LAE, mild MR.    Lexiscan Myoview 11/10/2022 revealed anterolateral and inferolateral scar without ischemia in the absence of chest pain. Cardiac catheterization was deferred.   She presents today for a HF follow-up visit with a chief complaint of moderate shortness of breath with little exertion. Has associated fatigue, pedal edema and chronic difficulty sleeping along with this. Denies chest pain, cough, palpitations, abdominal distention, dizziness or weight gain. Overall, she says that she feels "much better" from the last time she was here. Saw Dr Cathie Hoops yesterday and was found to be hypokalemic. Potassium BID for 1 week was given.   She has oxygen that she wears at home at 2L but does not have it to wear when she leaves the house. She also says that some of her nasal cannula tubing is kinked. (2 nasal cannula tubings given to her  today). She says that she used to see Dr. Meredeth Ide "many" years ago.   Only takes her furosemide PRN based on weight gain and she says that her weight has been stable.   ROS: All systems negative except as listed in HPI, PMH and Problem List.  SH:  Social History   Socioeconomic History   Marital status: Widowed    Spouse name: Not on file   Number of children: Not on file   Years of education: Not on file   Highest education level: Not on file  Occupational History   Not on file  Tobacco Use   Smoking status: Former    Current packs/day: 0.00    Types: Cigarettes    Quit date: 01/09/1995    Years since quitting: 28.6   Smokeless tobacco: Never  Vaping Use   Vaping status: Never Used  Substance and Sexual Activity   Alcohol use: No   Drug use: No   Sexual activity: Yes    Birth control/protection: None  Other Topics Concern   Not on file  Social History Narrative   Adult son   Social Determinants of Health   Financial Resource Strain: Medium Risk (10/29/2020)   Received from [facility], [facility]   Overall Financial Resource Strain (CARDIA)    Difficulty of Paying Living Expenses: Somewhat hard  Food Insecurity: No Food Insecurity (11/10/2022)   Hunger Vital Sign    Worried About Running Out of Food in the Last Year: Never true    Ran Out of Food in the Last Year: Never true  Transportation Needs:  No Transportation Needs (11/10/2022)   PRAPARE - Administrator, Civil Service (Medical): No    Lack of Transportation (Non-Medical): No  Physical Activity: Unknown (08/21/2017)   Exercise Vital Sign    Days of Exercise per Week: Patient declined    Minutes of Exercise per Session: Patient declined  Stress: Unknown (08/21/2017)   [employer] of Occupational Health - Occupational Stress Questionnaire    Feeling of Stress : Patient declined  Social Connections: Unknown (08/21/2017)   Social Connection and Isolation Panel [NHANES]    Frequency of  Communication with Friends and Family: Patient declined    Frequency of Social Gatherings with Friends and Family: Patient declined    Attends Religious Services: Patient declined    Database administrator or Organizations: Patient declined    Attends Banker Meetings: Patient declined    Marital Status: Patient declined  Intimate Partner Violence: Not At Risk (11/10/2022)   Humiliation, Afraid, Rape, and Kick questionnaire    Fear of Current or Ex-Partner: No    Emotionally Abused: No    Physically Abused: No    Sexually Abused: No    FH:  Family History  Problem Relation Age of Onset   CAD Mother    Diabetes Mother    Hypertension Mother    CAD Father    Diabetes Father    Hypertension Father    Stroke Father     Past Medical History:  Diagnosis Date   Aortic atherosclerosis (HCC)    Asthma    Chest pain 11/10/2022   CHF (congestive heart failure) (HCC)    Coronary artery disease    Dyspnea    Edema of both legs    GERD (gastroesophageal reflux disease)    HFrEF (heart failure with reduced ejection fraction) (HCC)    History of 2019 novel coronavirus disease (COVID-19) 10/28/2020   Hypertension    Myocardial infarction (HCC)    OSA (obstructive sleep apnea)    noncompliant with nocturnal PAP therapy   RA (rheumatoid arthritis) (HCC)     Current Outpatient Medications  Medication Sig Dispense Refill   acetaminophen (TYLENOL) 650 MG CR tablet Take 1,300 mg by mouth as needed for pain.     albuterol (VENTOLIN HFA) 108 (90 Base) MCG/ACT inhaler Inhale 1-2 puffs into the lungs every 6 (six) hours as needed for wheezing or shortness of breath.     alendronate (FOSAMAX) 70 MG tablet Take 70 mg by mouth once a week. Thursday (Patient not taking: Reported on 06/21/2023)     apixaban (ELIQUIS) 2.5 MG TABS tablet Take 1 tablet (2.5 mg total) by mouth 2 (two) times daily. 60 tablet 5   atorvastatin (LIPITOR) 40 MG tablet Take 1 tablet (40 mg total) by mouth every  morning. 90 tablet 0   carvedilol (COREG) 6.25 MG tablet Take 1 tablet (6.25 mg total) by mouth 2 (two) times daily. 180 tablet 1   [employer]S VITAMIN B12 1000 MCG TBCR Take 1,000 mcg by mouth daily.     dapagliflozin propanediol (FARXIGA) 10 MG TABS tablet Take 1 tablet (10 mg total) by mouth daily before breakfast. 30 tablet 5   fluticasone (FLONASE) 50 MCG/ACT nasal spray Place 2 sprays into the nose daily as needed for allergies.     folic acid (FOLVITE) 1 MG tablet Take 1 mg by mouth daily.     furosemide (LASIX) 20 MG tablet Take 1 tablet (20 mg total) by mouth daily. (Patient not taking: Reported on  06/21/2023) 90 tablet 0   methotrexate (RHEUMATREX) 2.5 MG tablet Take 15 mg by mouth every Monday.     montelukast (SINGULAIR) 10 MG tablet Take 10 mg by mouth at bedtime.     nitroGLYCERIN (NITROSTAT) 0.4 MG SL tablet Place 1 tablet (0.4 mg total) under the tongue every 5 (five) minutes as needed for chest pain. (Patient not taking: Reported on 08/15/2023) 30 tablet 0   potassium chloride SA (KLOR-CON M) 20 MEQ tablet Take 1 tablet (20 mEq total) by mouth 2 (two) times daily. 14 tablet 0   predniSONE (DELTASONE) 5 MG tablet Take 5 mg by mouth daily with breakfast.     sacubitril-valsartan (ENTRESTO) 49-51 MG Take 1 tablet by mouth 2 (two) times daily. 180 tablet 3   No current facility-administered medications for this visit.   Vitals:   08/16/23 1120  BP: 134/60  Pulse: 64  SpO2: 97%  Weight: 179 lb (81.2 kg)   Wt Readings from Last 3 Encounters:  08/16/23 179 lb (81.2 kg)  08/15/23 176 lb 14.4 oz (80.2 kg)  07/19/23 180 lb (81.6 kg)   Lab Results  Component Value Date   CREATININE 0.78 08/15/2023   CREATININE 0.76 07/19/2023   CREATININE 0.79 06/21/2023    PHYSICAL EXAM:  General:  Well appearing. No resp difficulty HEENT: upper eyelids appear puffy Neck: supple. JVP flat. No lymphadenopathy or thryomegaly appreciated. Cor: PMI normal. Regular rate & rhythm. No rubs, gallops or  murmurs. Lungs: few rales in bilateral lower lobes Abdomen: soft, nontender, nondistended. No hepatosplenomegaly. No bruits or masses.  Extremities: no cyanosis, clubbing, fingers deformed due to RA, 1+ pitting bilaterally around ankles Neuro: alert & oriented x3, cranial nerves grossly intact. Moves all 4 extremities w/o difficulty. Affect pleasant.   ECG: not done  ReDs: 35%   ASSESSMENT & PLAN:  1: NICM with reduced ejection fraction- - likely due to HTN/ PE or untreated OSA - NYHA class III - euvolemic today - weighing daily; reminded to call for an overnight weight gain of > 2 pounds or a weekly weight gain of > 5 pounds - weight stable from last visit here 1 month ago - ReDs 35% - Echo 05/03/21: EF of 45-50% with moderate LAE.  - Echo 01/29/22: EF of 45-50% along with mild LVH, moderately elevated PA pressure and mild MR. - Echo 11/11/22: EF 20-25% with mild LAE, mild MR. Eugenie Birks Myoview 11/10/2022 revealed anterolateral and inferolateral scar without ischemia in the absence of chest pain. Cardiac catheterization was deferred.  - not adding salt and does read food labels for sodium content - continue carvedilol 6.25mg  BID  - continue farxiga 10mg  daily - resume furosemide 20mg  on Monday & Friday - continue entresto 49/51mg  BID - continue potassium BID - BMET today and then again next week - would like to resume spironolactone  - once GDMT is as optimized as possible, will update echo; may need cMRI in future  - she has previously deferred catheterization - saw cardiology (Paraschos) 07/24 - BNP 11/26/22 was 2966.8  2: HTN- - BP 134/60 - sees PCP ([facility])  - BMP 08/15/23 reviewed and showed sodium 143, potassium 2.9, creatinine 0.78 & GFR >60 - BMET today  3: Obstructive sleep apnea- - does not wear CPAP - wearing 2L oxygen at home but doesn't have it on her because she says that she needs a portable oxygen tank - refer to Dr. Meredeth Ide as she says  that she saw him  many years ago  4: RA- - saw rheumatologist Allena Katz) 08/24 - continue daily prednisone 5mg  daily  5: PE- - saw hematology Cathie Hoops) 12/24 - continue apixaban 2.5mg  BID - 2L oxygen at home  Return in 2 weeks, sooner if needed.

## 2023-08-15 NOTE — Progress Notes (Signed)
Hematology/Oncology Progress note [phone] [phone]      Patient Care Team: Oswaldo Conroy, MD as PCP - General (Family Medicine) Rickard Patience, MD as Consulting Physician (Oncology)  ASSESSMENT & PLAN:   History of pulmonary embolism Unprovoked pulmonary embolism She has finished 6 months of therapeutic anticoagulation. hypercoagulable work up negative.  Continue Eliquis 2.5mg  BID.  Prescription was sent to pharmacy. Recommend age appropriate cancer screening.   Macrocytosis without anemia Hemoglobin is within normal limits Slight macrocytosis, persistent.likely from methotrexate.  check B12 and folate- results are pending Continue B12 and folate supplementation.   Hypokalemia K 2.9. recommend potassium chloride BID, I sent one week supply. Hypokalemia may contribute to her weakness  I recommend patient to contact her cardiologist and primary care provider to repeat levels and decide whether she needs to continue long term potassium.   SOB (shortness of breath) Not hypoxia in the clinic. She felt better after using 2L oxygen nasal cannula in the clinic.  Crackles at bilateral lung base. She is not taking lasix. Marland Kitchen  Recommend her to contact cardiologist's office.  I discussed case with her heart failure clinic provider [facility], pt will be seen tomorrow.    Orders Placed This Encounter  Procedures   CMP (Cancer Center only)    Standing Status:   Future    Standing Expiration Date:   08/14/2024   CBC with Differential (Cancer Center Only)    Standing Status:   Future    Standing Expiration Date:   08/14/2024   Vitamin B12    Standing Status:   Future    Standing Expiration Date:   08/14/2024   Follow up in 6 months. All questions were answered. The patient knows to call the clinic with any problems, questions or concerns. No barriers to learning was detected.  Rickard Patience, MD 08/15/2023   CHIEF COMPLAINTS/PURPOSE OF CONSULTATION:   Pulmonary Embolism  HISTORY OF PRESENTING ILLNESS:  Tina Fields 77 y.o. female is referred to establish care for pulmonary embolism.   01/28/22 - 02/01/22 patient presented for evaluation of SOB. She was found to be hypoxic.  Work up included CTa which was positive for acute pulmonary embolism,  CT angio chest 01/28/22: Positive for acute PE with CT evidence of right heart strain (RV/LV ratio: 1.06) consistent with submassive PE. Extensive bilateral ground glass opacities with underlying changes of COPD. In the presence of interval mediastinal and bilateral hilar adenopathy, this is most likely infectious in origin with associated reactive adenopathy. Cardiomegaly. Previously demonstrated liver and bilateral renal cysts. Hiatal hernia. Patient denies any triggering immobilization factors. She was started on heparin gtt and transitioned to Elqiuis at discharge.  Venous Doppler did not show DVT in lower extremities.  She was also treated with community pneumonia.  Family history of lung cancer - paternal aunt.  Appetite is fair. She is accompanied by cousin.    INTERVAL HISTORY Tina Fields is a 77 y.o. female who has above history reviewed by me today presents for follow up visit for management of history of pulmonary embolism She feels well today. No new complaint.  She uses nasal cannula oxygen as needed at home. She reports feeling very tried and shortness of breath. Not hypoxic in our clinic. She states that her fluid pills have been recently adjusted.  She is not taking Lasix currently.  She felt better after using nasal cannula oxygen 2L in our clinic.  No chest pain nausea vomiting, diarrhea, fever.  MEDICAL HISTORY:  Past Medical History:  Diagnosis Date   Aortic atherosclerosis (HCC)    Asthma    Chest pain 11/10/2022   CHF (congestive heart failure) (HCC)    Coronary artery disease    Dyspnea    Edema of both legs    GERD (gastroesophageal reflux disease)     HFrEF (heart failure with reduced ejection fraction) (HCC)    History of 2019 novel coronavirus disease (COVID-19) 10/28/2020   Hypertension    Myocardial infarction (HCC)    OSA (obstructive sleep apnea)    noncompliant with nocturnal PAP therapy   RA (rheumatoid arthritis) (HCC)     SURGICAL HISTORY: Past Surgical History:  Procedure Laterality Date   BREAST SURGERY     CARDIAC CATHETERIZATION Left 06/21/2005   Moderate ostial LCx and LAD disease; LVEF 45%; Location: [facility]; Surgeon: Adrian Blackwater, MD   CARDIAC CATHETERIZATION Left 05/20/2008   No occlusive CAD; LVEF 65%; Location: [facility]; Surgeon: Rudean Hitt, MD   COLONOSCOPY     ESOPHAGOGASTRODUODENOSCOPY     TUBAL LIGATION      SOCIAL HISTORY: Social History   Socioeconomic History   Marital status: Widowed    Spouse name: Not on file   Number of children: Not on file   Years of education: Not on file   Highest education level: Not on file  Occupational History   Not on file  Tobacco Use   Smoking status: Former    Current packs/day: 0.00    Types: Cigarettes    Quit date: 01/09/1995    Years since quitting: 28.6   Smokeless tobacco: Never  Vaping Use   Vaping status: Never Used  Substance and Sexual Activity   Alcohol use: No   Drug use: No   Sexual activity: Yes    Birth control/protection: None  Other Topics Concern   Not on file  Social History Narrative   Adult son   Social Determinants of Health   Financial Resource Strain: Medium Risk (10/29/2020)   [facility], [facility]   Overall Financial Resource Strain (CARDIA)    Difficulty of Paying Living Expenses: Somewhat hard  Food Insecurity: No Food Insecurity (11/10/2022)   Hunger Vital Sign    Worried About Running Out of Food in the Last Year: Never true    Ran Out of Food in the Last Year: Never true  Transportation Needs: No Transportation Needs (11/10/2022)   PRAPARE - Administrator, Civil Service (Medical):  No    Lack of Transportation (Non-Medical): No  Physical Activity: Unknown (08/21/2017)   Exercise Vital Sign    Days of Exercise per Week: Patient declined    Minutes of Exercise per Session: Patient declined  Stress: Unknown (08/21/2017)   [employer] of Occupational Health - Occupational Stress Questionnaire    Feeling of Stress : Patient declined  Social Connections: Unknown (08/21/2017)   Social Connection and Isolation Panel [NHANES]    Frequency of Communication with Friends and Family: Patient declined    Frequency of Social Gatherings with Friends and Family: Patient declined    Attends Religious Services: Patient declined    Database administrator or Organizations: Patient declined    Attends Banker Meetings: Patient declined    Marital Status: Patient declined  Intimate Partner Violence: Not At Risk (11/10/2022)   Humiliation, Afraid, Rape, and Kick questionnaire    Fear of Current or Ex-Partner: No    Emotionally Abused: No  Physically Abused: No    Sexually Abused: No    FAMILY HISTORY: Family History  Problem Relation Age of Onset   CAD Mother    Diabetes Mother    Hypertension Mother    CAD Father    Diabetes Father    Hypertension Father    Stroke Father     ALLERGIES:  is allergic to ace inhibitors, hydrochlorothiazide, latex, azithromycin, and jardiance [empagliflozin].  MEDICATIONS:  Current Outpatient Medications  Medication Sig Dispense Refill   acetaminophen (TYLENOL) 650 MG CR tablet Take 1,300 mg by mouth as needed for pain.     albuterol (VENTOLIN HFA) 108 (90 Base) MCG/ACT inhaler Inhale 1-2 puffs into the lungs every 6 (six) hours as needed for wheezing or shortness of breath.     atorvastatin (LIPITOR) 40 MG tablet Take 1 tablet (40 mg total) by mouth every morning. 90 tablet 0   carvedilol (COREG) 6.25 MG tablet Take 1 tablet (6.25 mg total) by mouth 2 (two) times daily. 180 tablet 1   [employer] VITAMIN B12 1000 MCG TBCR Take  1,000 mcg by mouth daily.     dapagliflozin propanediol (FARXIGA) 10 MG TABS tablet Take 1 tablet (10 mg total) by mouth daily before breakfast. 30 tablet 5   fluticasone (FLONASE) 50 MCG/ACT nasal spray Place 2 sprays into the nose daily as needed for allergies.     folic acid (FOLVITE) 1 MG tablet Take 1 mg by mouth daily.     methotrexate (RHEUMATREX) 2.5 MG tablet Take 15 mg by mouth every Monday.     montelukast (SINGULAIR) 10 MG tablet Take 10 mg by mouth at bedtime.     potassium chloride SA (KLOR-CON M) 20 MEQ tablet Take 1 tablet (20 mEq total) by mouth 2 (two) times daily. 14 tablet 0   predniSONE (DELTASONE) 5 MG tablet Take 5 mg by mouth daily with breakfast.     sacubitril-valsartan (ENTRESTO) 49-51 MG Take 1 tablet by mouth 2 (two) times daily. 180 tablet 3   alendronate (FOSAMAX) 70 MG tablet Take 70 mg by mouth once a week. Thursday (Patient not taking: Reported on 06/21/2023)     apixaban (ELIQUIS) 2.5 MG TABS tablet Take 1 tablet (2.5 mg total) by mouth 2 (two) times daily. 60 tablet 5   furosemide (LASIX) 20 MG tablet Take 1 tablet (20 mg total) by mouth daily. (Patient not taking: Reported on 06/21/2023) 90 tablet 0   nitroGLYCERIN (NITROSTAT) 0.4 MG SL tablet Place 1 tablet (0.4 mg total) under the tongue every 5 (five) minutes as needed for chest pain. (Patient not taking: Reported on 08/15/2023) 30 tablet 0   No current facility-administered medications for this visit.    Review of Systems  Constitutional:  Positive for fatigue. Negative for appetite change, chills and fever.  HENT:   Negative for hearing loss and voice change.   Eyes:  Negative for eye problems.  Respiratory:  Positive for shortness of breath. Negative for chest tightness and cough.   Cardiovascular:  Negative for chest pain.  Gastrointestinal:  Negative for abdominal distention, abdominal pain and blood in stool.  Endocrine: Negative for hot flashes.  Genitourinary:  Negative for difficulty urinating  and frequency.   Musculoskeletal:  Negative for arthralgias.  Skin:  Negative for itching and rash.  Neurological:  Negative for extremity weakness.  Hematological:  Negative for adenopathy.  Psychiatric/Behavioral:  Negative for confusion.      PHYSICAL EXAMINATION: Vitals:   08/15/23 0855  BP: (!) 150/87  Pulse: 77  Resp: 18  Temp: (!) 96.2 F (35.7 C)  SpO2: 96%   Filed Weights   08/15/23 0855  Weight: 176 lb 14.4 oz (80.2 kg)    Physical Exam Constitutional:      General: She is not in acute distress.    Appearance: She is not diaphoretic.     Comments: Patient sits in the wheelchair.   HENT:     Head: Normocephalic and atraumatic.     Nose: Nose normal.     Mouth/Throat:     Pharynx: No oropharyngeal exudate.  Eyes:     General: No scleral icterus.    Pupils: Pupils are equal, round, and reactive to light.  Cardiovascular:     Rate and Rhythm: Normal rate and regular rhythm.     Heart sounds: No murmur heard. Pulmonary:     Effort: Pulmonary effort is normal. No respiratory distress.     Comments: Some Crackles on bilateral base of lungs.  Abdominal:     Palpations: Abdomen is soft.  Musculoskeletal:        General: Normal range of motion.     Cervical back: Normal range of motion and neck supple.  Skin:    General: Skin is warm and dry.     Findings: No erythema.  Neurological:     Mental Status: She is alert and oriented to person, place, and time. Mental status is at baseline.     Cranial Nerves: No cranial nerve deficit.     Motor: No abnormal muscle tone.  Psychiatric:        Mood and Affect: Mood and affect normal.      LABORATORY DATA:  I have reviewed the data as listed Lab Results  Component Value Date   WBC 4.3 08/15/2023   HGB 12.8 08/15/2023   HCT 40.3 08/15/2023   MCV 103.3 (H) 08/15/2023   PLT 207 08/15/2023   Recent Labs    11/26/22 0943 11/27/22 0330 02/07/23 1428 05/10/23 1127 06/05/23 2103 06/21/23 1220 07/19/23 1143  08/15/23 0842  NA 140   < > 141   < > 139 141 144 143  K 3.0*   < > 3.0*   < > 3.8 3.5 3.7 2.9*  CL 109   < > 106   < > 110 106 109* 109  CO2 24   < > 26   < > 25 25 22 28   GLUCOSE 121*   < > 123*   < > 96 127* 127* 120*  BUN 22   < > 24*   < > 14 18 15 19   CREATININE 0.99   < > 0.90   < > 0.78 0.79 0.76 0.78  CALCIUM 8.8*   < > 8.6*   < > 8.1* 8.5* 8.7 8.4*  GFRNONAA 58*   < > >60  --  >60 >60  --  >60  PROT 7.0  --  6.9  --   --   --   --  6.5  ALBUMIN 3.2*  --  3.2*  --   --   --   --  3.2*  AST 17  --  16  --   --   --   --  12*  ALT 10  --  16  --   --   --   --  12  ALKPHOS 65  --  64  --   --   --   --  57  BILITOT 0.8  --  1.2  --   --   --   --  1.3*   < > = values in this interval not displayed.    RADIOGRAPHIC STUDIES: I have personally reviewed the radiological images as listed and agreed with the findings in the report. No results found.

## 2023-08-15 NOTE — Assessment & Plan Note (Addendum)
Hemoglobin is within normal limits Slight macrocytosis, persistent.likely from methotrexate.  check B12 and folate- results are pending Continue B12 and folate supplementation.

## 2023-08-15 NOTE — Assessment & Plan Note (Addendum)
K 2.9. recommend potassium chloride BID, I sent one week supply. Hypokalemia may contribute to her weakness  I recommend patient to contact her cardiologist and primary care provider to repeat levels and decide whether she needs to continue long term potassium.

## 2023-08-15 NOTE — Telephone Encounter (Signed)
Pt confirmed appt for 08/16/23

## 2023-08-15 NOTE — Assessment & Plan Note (Addendum)
Not hypoxia in the clinic. She felt better after using 2L oxygen nasal cannula in the clinic.  Crackles at bilateral lung base. She is not taking lasix. Marland Kitchen  Recommend her to contact cardiologist's office.

## 2023-08-16 ENCOUNTER — Telehealth: Payer: Self-pay

## 2023-08-16 ENCOUNTER — Other Ambulatory Visit
Admission: RE | Admit: 2023-08-16 | Discharge: 2023-08-16 | Disposition: A | Discharge status: Home / Self Care | Payer: 59 | Source: Ambulatory Visit | Attending: Nurse Practitioner | Admitting: Nurse Practitioner

## 2023-08-16 ENCOUNTER — Other Ambulatory Visit
Admission: RE | Admit: 2023-08-16 | Discharge: 2023-08-16 | Disposition: A | Discharge status: Home / Self Care | Payer: 59 | Source: Ambulatory Visit | Attending: Family | Admitting: Family

## 2023-08-16 ENCOUNTER — Ambulatory Visit: Payer: 59 | Admitting: Family

## 2023-08-16 ENCOUNTER — Encounter: Payer: Self-pay | Admitting: Family

## 2023-08-16 VITALS — BP 134/60 | HR 64 | Wt 179.0 lb

## 2023-08-16 DIAGNOSIS — I110 Hypertensive heart disease with heart failure: Secondary | ICD-10-CM | POA: Diagnosis not present

## 2023-08-16 DIAGNOSIS — R0602 Shortness of breath: Secondary | ICD-10-CM | POA: Diagnosis not present

## 2023-08-16 DIAGNOSIS — I5022 Chronic systolic (congestive) heart failure: Secondary | ICD-10-CM

## 2023-08-16 DIAGNOSIS — M069 Rheumatoid arthritis, unspecified: Secondary | ICD-10-CM

## 2023-08-16 DIAGNOSIS — I10 Essential (primary) hypertension: Secondary | ICD-10-CM

## 2023-08-16 DIAGNOSIS — I2782 Chronic pulmonary embolism: Secondary | ICD-10-CM

## 2023-08-16 DIAGNOSIS — G4733 Obstructive sleep apnea (adult) (pediatric): Secondary | ICD-10-CM

## 2023-08-16 LAB — BASIC METABOLIC PANEL
Anion gap: 9 (ref 5–15)
BUN: 17 mg/dL (ref 8–23)
CO2: 25 mmol/L (ref 22–32)
Calcium: 8.4 mg/dL — ABNORMAL LOW (ref 8.9–10.3)
Chloride: 108 mmol/L (ref 98–111)
Creatinine, Ser: 0.76 mg/dL (ref 0.44–1.00)
GFR, Estimated: >60 mL/min (ref >60)
Glucose, Bld: 100 mg/dL — ABNORMAL HIGH (ref 70–99)
Potassium: 3.7 mmol/L (ref 3.5–5.1)
Sodium: 142 mmol/L (ref 135–145)

## 2023-08-16 LAB — LIPID PANEL
Cholesterol: 107 mg/dL (ref 0–200)
HDL: 45 mg/dL (ref >40)
LDL Cholesterol: 52 mg/dL (ref 0–99)
Total CHOL/HDL Ratio: 2.4 {ratio}
Triglycerides: 49 mg/dL (ref <150)
VLDL: 10 mg/dL (ref 0–40)

## 2023-08-16 NOTE — Telephone Encounter (Signed)
-----   Message from Rickard Patience sent at 08/15/2023  9:59 PM EST ----- Please let patient know that her B12 level is elevated. She may decrease  B12 supplementation to once per week

## 2023-08-16 NOTE — Telephone Encounter (Signed)
Called patient and informed her that her B12 is elevated and that Dr. Cathie Hoops recommends she decrease her B12 to once per week. Patient gave verbal understanding.

## 2023-08-16 NOTE — Patient Instructions (Addendum)
Start taking your Lasix on Mondays and Fridays  Go over to the [facility]. Go pass the gift shop and have your blood work completed today and then repeat them in 1 week.  We will only call you if the results are abnormal or if the provider would like to make medication changes.

## 2023-08-18 ENCOUNTER — Inpatient Hospital Stay
Admission: EM | Admit: 2023-08-18 | Discharge: 2023-08-22 | DRG: 291 | Disposition: A | Discharge status: Home Health | Payer: 59 | Attending: Internal Medicine | Admitting: Internal Medicine

## 2023-08-18 ENCOUNTER — Emergency Department: Payer: 59

## 2023-08-18 ENCOUNTER — Other Ambulatory Visit: Payer: Self-pay

## 2023-08-18 DIAGNOSIS — K219 Gastro-esophageal reflux disease without esophagitis: Secondary | ICD-10-CM | POA: Diagnosis present

## 2023-08-18 DIAGNOSIS — Z9981 Dependence on supplemental oxygen: Secondary | ICD-10-CM | POA: Diagnosis not present

## 2023-08-18 DIAGNOSIS — Z91040 Latex allergy status: Secondary | ICD-10-CM

## 2023-08-18 DIAGNOSIS — Z87891 Personal history of nicotine dependence: Secondary | ICD-10-CM

## 2023-08-18 DIAGNOSIS — Z8616 Personal history of COVID-19: Secondary | ICD-10-CM

## 2023-08-18 DIAGNOSIS — R0602 Shortness of breath: Secondary | ICD-10-CM | POA: Diagnosis present

## 2023-08-18 DIAGNOSIS — E669 Obesity, unspecified: Secondary | ICD-10-CM | POA: Diagnosis present

## 2023-08-18 DIAGNOSIS — I252 Old myocardial infarction: Secondary | ICD-10-CM | POA: Diagnosis not present

## 2023-08-18 DIAGNOSIS — Z66 Do not resuscitate: Secondary | ICD-10-CM | POA: Diagnosis present

## 2023-08-18 DIAGNOSIS — E876 Hypokalemia: Secondary | ICD-10-CM | POA: Diagnosis present

## 2023-08-18 DIAGNOSIS — Z79899 Other long term (current) drug therapy: Secondary | ICD-10-CM | POA: Diagnosis not present

## 2023-08-18 DIAGNOSIS — J9621 Acute and chronic respiratory failure with hypoxia: Secondary | ICD-10-CM

## 2023-08-18 DIAGNOSIS — Z91199 Patient's noncompliance with other medical treatment and regimen due to unspecified reason: Secondary | ICD-10-CM

## 2023-08-18 DIAGNOSIS — G4733 Obstructive sleep apnea (adult) (pediatric): Secondary | ICD-10-CM | POA: Diagnosis present

## 2023-08-18 DIAGNOSIS — Z7983 Long term (current) use of bisphosphonates: Secondary | ICD-10-CM

## 2023-08-18 DIAGNOSIS — Z7952 Long term (current) use of systemic steroids: Secondary | ICD-10-CM

## 2023-08-18 DIAGNOSIS — Z7901 Long term (current) use of anticoagulants: Secondary | ICD-10-CM | POA: Diagnosis not present

## 2023-08-18 DIAGNOSIS — Z86711 Personal history of pulmonary embolism: Secondary | ICD-10-CM

## 2023-08-18 DIAGNOSIS — D7589 Other specified diseases of blood and blood-forming organs: Secondary | ICD-10-CM | POA: Diagnosis present

## 2023-08-18 DIAGNOSIS — J45909 Unspecified asthma, uncomplicated: Secondary | ICD-10-CM | POA: Diagnosis present

## 2023-08-18 DIAGNOSIS — E785 Hyperlipidemia, unspecified: Secondary | ICD-10-CM | POA: Diagnosis present

## 2023-08-18 DIAGNOSIS — I428 Other cardiomyopathies: Secondary | ICD-10-CM | POA: Diagnosis present

## 2023-08-18 DIAGNOSIS — I509 Heart failure, unspecified: Principal | ICD-10-CM

## 2023-08-18 DIAGNOSIS — Z888 Allergy status to other drugs, medicaments and biological substances status: Secondary | ICD-10-CM

## 2023-08-18 DIAGNOSIS — I110 Hypertensive heart disease with heart failure: Secondary | ICD-10-CM | POA: Diagnosis present

## 2023-08-18 DIAGNOSIS — I2510 Atherosclerotic heart disease of native coronary artery without angina pectoris: Secondary | ICD-10-CM

## 2023-08-18 DIAGNOSIS — I5043 Acute on chronic combined systolic (congestive) and diastolic (congestive) heart failure: Secondary | ICD-10-CM | POA: Diagnosis present

## 2023-08-18 DIAGNOSIS — Z823 Family history of stroke: Secondary | ICD-10-CM | POA: Diagnosis not present

## 2023-08-18 DIAGNOSIS — Z881 Allergy status to other antibiotic agents status: Secondary | ICD-10-CM

## 2023-08-18 DIAGNOSIS — Z833 Family history of diabetes mellitus: Secondary | ICD-10-CM

## 2023-08-18 DIAGNOSIS — Z8249 Family history of ischemic heart disease and other diseases of the circulatory system: Secondary | ICD-10-CM

## 2023-08-18 DIAGNOSIS — Z5986 Financial insecurity: Secondary | ICD-10-CM

## 2023-08-18 DIAGNOSIS — M069 Rheumatoid arthritis, unspecified: Secondary | ICD-10-CM | POA: Diagnosis present

## 2023-08-18 LAB — CBC
HCT: 39.6 % (ref 36.0–46.0)
Hemoglobin: 12.3 g/dL (ref 12.0–15.0)
MCH: 33.1 pg (ref 26.0–34.0)
MCHC: 31.1 g/dL (ref 30.0–36.0)
MCV: 106.5 fL — ABNORMAL HIGH (ref 80.0–100.0)
Platelets: 231 10*3/uL (ref 150–400)
RBC: 3.72 MIL/uL — ABNORMAL LOW (ref 3.87–5.11)
RDW: 15.6 % — ABNORMAL HIGH (ref 11.5–15.5)
WBC: 4.3 10*3/uL (ref 4.0–10.5)
nRBC: 0 % (ref 0.0–0.2)

## 2023-08-18 LAB — TROPONIN I (HIGH SENSITIVITY)
Troponin I (High Sensitivity): 10 ng/L (ref <18)
Troponin I (High Sensitivity): 10 ng/L (ref <18)

## 2023-08-18 LAB — BASIC METABOLIC PANEL
Anion gap: 5 (ref 5–15)
BUN: 18 mg/dL (ref 8–23)
CO2: 24 mmol/L (ref 22–32)
Calcium: 7.5 mg/dL — ABNORMAL LOW (ref 8.9–10.3)
Chloride: 113 mmol/L — ABNORMAL HIGH (ref 98–111)
Creatinine, Ser: 0.7 mg/dL (ref 0.44–1.00)
GFR, Estimated: >60 mL/min (ref >60)
Glucose, Bld: 91 mg/dL (ref 70–99)
Potassium: 4.6 mmol/L (ref 3.5–5.1)
Sodium: 142 mmol/L (ref 135–145)

## 2023-08-18 LAB — BRAIN NATRIURETIC PEPTIDE: B Natriuretic Peptide: 2610.1 pg/mL — ABNORMAL HIGH (ref 0.0–100.0)

## 2023-08-18 LAB — MAGNESIUM: Magnesium: 1.8 mg/dL (ref 1.7–2.4)

## 2023-08-18 MED ORDER — METHOTREXATE SODIUM 2.5 MG PO TABS
15.0000 mg | ORAL_TABLET | ORAL | Status: DC
Start: 2023-08-20 — End: 2023-08-22
  Administered 2023-08-20: 15 mg via ORAL
  Filled 2023-08-18: qty 6

## 2023-08-18 MED ORDER — CARVEDILOL 6.25 MG PO TABS
6.2500 mg | ORAL_TABLET | Freq: Two times a day (BID) | ORAL | Status: DC
  Administered 2023-08-18 – 2023-08-19 (×3): 6.25 mg via ORAL
  Filled 2023-08-18 (×4): qty 1

## 2023-08-18 MED ORDER — APIXABAN 2.5 MG PO TABS
2.5000 mg | ORAL_TABLET | Freq: Two times a day (BID) | ORAL | Status: DC
  Administered 2023-08-18 – 2023-08-22 (×8): 2.5 mg via ORAL
  Filled 2023-08-18 (×10): qty 1

## 2023-08-18 MED ORDER — FOLIC ACID 1 MG PO TABS
1.0000 mg | ORAL_TABLET | Freq: Every day | ORAL | Status: DC
  Administered 2023-08-18 – 2023-08-22 (×5): 1 mg via ORAL
  Filled 2023-08-18 (×5): qty 1

## 2023-08-18 MED ORDER — POTASSIUM CHLORIDE 20 MEQ PO PACK
40.0000 meq | PACK | Freq: Once | ORAL | Status: AC
  Administered 2023-08-18: 40 meq via ORAL
  Filled 2023-08-18: qty 2

## 2023-08-18 MED ORDER — ACETAMINOPHEN 325 MG PO TABS
650.0000 mg | ORAL_TABLET | Freq: Four times a day (QID) | ORAL | Status: DC | PRN
  Administered 2023-08-18 – 2023-08-19 (×2): 650 mg via ORAL
  Filled 2023-08-18 (×2): qty 2

## 2023-08-18 MED ORDER — MAGNESIUM HYDROXIDE 400 MG/5ML PO SUSP
30.0000 mL | Freq: Every day | ORAL | Status: DC | PRN
Start: 2023-08-18 — End: 2023-08-22

## 2023-08-18 MED ORDER — PREDNISONE 10 MG PO TABS
5.0000 mg | ORAL_TABLET | Freq: Every day | ORAL | Status: DC
  Administered 2023-08-19 – 2023-08-22 (×4): 5 mg via ORAL
  Filled 2023-08-18 (×4): qty 1

## 2023-08-18 MED ORDER — SACUBITRIL-VALSARTAN 49-51 MG PO TABS
1.0000 | ORAL_TABLET | Freq: Two times a day (BID) | ORAL | Status: DC
  Administered 2023-08-18 – 2023-08-21 (×6): 1 via ORAL
  Filled 2023-08-18 (×9): qty 1

## 2023-08-18 MED ORDER — FUROSEMIDE 10 MG/ML IJ SOLN
40.0000 mg | Freq: Two times a day (BID) | INTRAMUSCULAR | Status: DC
  Administered 2023-08-19 – 2023-08-20 (×3): 40 mg via INTRAVENOUS
  Filled 2023-08-18 (×3): qty 4

## 2023-08-18 MED ORDER — IPRATROPIUM-ALBUTEROL 0.5-2.5 (3) MG/3ML IN SOLN: 3.0000 mL | Freq: Four times a day (QID) | RESPIRATORY_TRACT | Status: DC | PRN

## 2023-08-18 MED ORDER — FUROSEMIDE 10 MG/ML IJ SOLN
40.0000 mg | Freq: Once | INTRAMUSCULAR | Status: AC
Start: 2023-08-18 — End: 2023-08-18
  Administered 2023-08-18: 40 mg via INTRAVENOUS
  Filled 2023-08-18: qty 4

## 2023-08-18 MED ORDER — TRAZODONE HCL 50 MG PO TABS: 25.0000 mg | ORAL_TABLET | Freq: Every evening | ORAL | Status: DC | PRN

## 2023-08-18 MED ORDER — DAPAGLIFLOZIN PROPANEDIOL 10 MG PO TABS
10.0000 mg | ORAL_TABLET | Freq: Every day | ORAL | Status: DC
  Administered 2023-08-20 – 2023-08-22 (×3): 10 mg via ORAL
  Filled 2023-08-18 (×4): qty 1

## 2023-08-18 MED ORDER — ONDANSETRON HCL 4 MG PO TABS: 4.0000 mg | ORAL_TABLET | Freq: Four times a day (QID) | ORAL | Status: DC | PRN

## 2023-08-18 MED ORDER — ONDANSETRON HCL 4 MG/2ML IJ SOLN: 4.0000 mg | Freq: Four times a day (QID) | INTRAMUSCULAR | Status: DC | PRN

## 2023-08-18 MED ORDER — ACETAMINOPHEN 650 MG RE SUPP: 650.0000 mg | Freq: Four times a day (QID) | RECTAL | Status: DC | PRN

## 2023-08-18 MED ORDER — VITAMIN B-12 1000 MCG PO TABS: 1000.0000 ug | ORAL_TABLET | ORAL | Status: DC

## 2023-08-18 MED ORDER — NITROGLYCERIN 0.4 MG SL SUBL: 0.4000 mg | SUBLINGUAL_TABLET | SUBLINGUAL | Status: DC | PRN

## 2023-08-18 MED ORDER — ALENDRONATE SODIUM 70 MG PO TABS: 70.0000 mg | ORAL_TABLET | ORAL | Status: DC

## 2023-08-18 MED ORDER — ATORVASTATIN CALCIUM 20 MG PO TABS
40.0000 mg | ORAL_TABLET | Freq: Every morning | ORAL | Status: DC
  Administered 2023-08-19 – 2023-08-22 (×4): 40 mg via ORAL
  Filled 2023-08-18 (×2): qty 2
  Filled 2023-08-18: qty 4
  Filled 2023-08-18: qty 2

## 2023-08-18 MED ORDER — POTASSIUM CHLORIDE CRYS ER 20 MEQ PO TBCR
20.0000 meq | EXTENDED_RELEASE_TABLET | Freq: Two times a day (BID) | ORAL | Status: DC
Start: 2023-08-18 — End: 2023-08-21
  Administered 2023-08-18 – 2023-08-21 (×6): 20 meq via ORAL
  Filled 2023-08-18 (×6): qty 1

## 2023-08-18 NOTE — Assessment & Plan Note (Signed)
-   She will be placed on as needed DuoNebs.

## 2023-08-18 NOTE — ED Triage Notes (Signed)
Pt from home via EMS for [facility] x2 days, pt on 2 liter's [place] chronically, EMS had to increase to 3 liter's. Pt is AOX4, NAD noted. Pt reports intermittent productive cough.

## 2023-08-18 NOTE — Assessment & Plan Note (Signed)
-   We will utilize CPAP nightly as tolerated.

## 2023-08-18 NOTE — Assessment & Plan Note (Signed)
-   This is clearly secondary to #1 1. - O2 protocol will be followed. - Management otherwise as above.

## 2023-08-18 NOTE — ED Triage Notes (Signed)
First nurse note:Arrived by EMS from home with c/o sob. 89% with 2L and placed on 3L with O2 sats of 98%.  History CHF and asthma   EMS vitals: 148/80 b/p 98% RA 96HR 18G RAC

## 2023-08-18 NOTE — H&P (Addendum)
[place]   PATIENT NAME: Tina Fields    MR#:  [id number]  DATE OF BIRTH:  April 28, 1946  DATE OF ADMISSION:  08/18/2023  PRIMARY CARE PHYSICIAN: Oswaldo Conroy, MD   Patient is coming from: Home  REQUESTING/REFERRING PHYSICIAN: Trinna Post, MD  CHIEF COMPLAINT:   Chief Complaint  Patient presents with   Shortness of Breath    HISTORY OF PRESENT ILLNESS:  Tina Fields is a 77 y.o. female with medical history significant for combined systolic and diastolic CHF, chronic respiratory failure on home O2 at 2 L by nasal cannula, coronary artery disease, PE on Eliquis, asthma, OSA, essential hypertension and GERD, who presented to the emergency room with acute onset of worsening dyspnea for the past few days which has been significantly worse today.  She has been having worsening lower extremity edema  yesterday but has improved today.  She also admits to orthopnea and paroxysmal nocturnal dyspnea as well as dyspnea on exertion.  She has been having mild dry cough and occasional wheezing.  No fever or chills.  No chest pain or palpitations.  She has been taking her Lasix regularly but has not been urinating as usual.   ED Course: Upon presentation to the emergency room, BP was 140/82 with pulse oximetry of 97% on 3 L of O2 by nasal cannula with otherwise normal vital signs.  Labs revealed unremarkable BMP.  BNP was 2610.1 and high sensitive troponin was 10 and later the same.  CBC showed macrocytosis. EKG as reviewed by me : EKG showed normal sinus rhythm with rate of 71 with sinus arrhythmia anterior inversion laterally and inferiorly. Imaging: Two-view chest x-ray showed cardiomegaly with pulmonary vascular congestion and diffuse bilateral interstitial prominence suggestive of pulmonary edema as well as small right and trace left pleural effusions.  The patient was given 40 mg of IV Lasix.  She will be admitted to cardiac telemetry bed for further evaluation and  management. PAST MEDICAL HISTORY:   Past Medical History:  Diagnosis Date   Aortic atherosclerosis (HCC)    Asthma    Chest pain 11/10/2022   CHF (congestive heart failure) (HCC)    Coronary artery disease    Dyspnea    Edema of both legs    GERD (gastroesophageal reflux disease)    HFrEF (heart failure with reduced ejection fraction) (HCC)    History of 2019 novel coronavirus disease (COVID-19) 10/28/2020   Hypertension    Myocardial infarction (HCC)    OSA (obstructive sleep apnea)    noncompliant with nocturnal PAP therapy   RA (rheumatoid arthritis) (HCC)     PAST SURGICAL HISTORY:   Past Surgical History:  Procedure Laterality Date   BREAST SURGERY     CARDIAC CATHETERIZATION Left 06/21/2005   Moderate ostial LCx and LAD disease; LVEF 45%; Location: [facility]; Surgeon: Adrian Blackwater, MD   CARDIAC CATHETERIZATION Left 05/20/2008   No occlusive CAD; LVEF 65%; Location: [facility]; Surgeon: Rudean Hitt, MD   COLONOSCOPY     ESOPHAGOGASTRODUODENOSCOPY     TUBAL LIGATION      SOCIAL HISTORY:   Social History   Tobacco Use   Smoking status: Former    Current packs/day: 0.00    Types: Cigarettes    Quit date: 01/09/1995    Years since quitting: 28.6   Smokeless tobacco: Never  Substance Use Topics   Alcohol use: No    FAMILY HISTORY:   Family History  Problem Relation Age of Onset  CAD Mother    Diabetes Mother    Hypertension Mother    CAD Father    Diabetes Father    Hypertension Father    Stroke Father     DRUG ALLERGIES:   Allergies  Allergen Reactions   Ace Inhibitors Cough    Other reaction(s): Cough    Hydrochlorothiazide Other (See Comments)    Cramps   Latex Hives   Azithromycin Rash   Jardiance [Empagliflozin] Itching    REVIEW OF SYSTEMS:   ROS As per history of present illness. All pertinent systems were reviewed above. Constitutional, HEENT, cardiovascular, respiratory, GI, GU, musculoskeletal, neuro, psychiatric, endocrine,  integumentary and hematologic systems were reviewed and are otherwise negative/unremarkable except for positive findings mentioned above in the HPI.   MEDICATIONS AT HOME:   Prior to Admission medications   Medication Sig Start Date End Date Taking? Authorizing Provider  acetaminophen (TYLENOL) 650 MG CR tablet Take 1,300 mg by mouth as needed for pain.    [provider]  albuterol (VENTOLIN HFA) 108 (90 Base) MCG/ACT inhaler Inhale 1-2 puffs into the lungs every 6 (six) hours as needed for wheezing or shortness of breath.    [provider]  alendronate (FOSAMAX) 70 MG tablet Take 70 mg by mouth once a week. Thursday    [provider]  apixaban (ELIQUIS) 2.5 MG TABS tablet Take 1 tablet (2.5 mg total) by mouth 2 (two) times daily. 08/15/23   Rickard Patience, MD  atorvastatin (LIPITOR) 40 MG tablet Take 1 tablet (40 mg total) by mouth every morning. 12/05/21   Darlin Drop, DO  carvedilol (COREG) 6.25 MG tablet Take 1 tablet (6.25 mg total) by mouth 2 (two) times daily. 06/21/23   Delma Freeze, FNP  [facility] VITAMIN B12 1000 MCG TBCR Take 1,000 mcg by mouth once a week.    [provider]  dapagliflozin propanediol (FARXIGA) 10 MG TABS tablet Take 1 tablet (10 mg total) by mouth daily before breakfast. 04/18/23   Delma Freeze, FNP  fluticasone (FLONASE) 50 MCG/ACT nasal spray Place 2 sprays into the nose daily as needed for allergies.    [provider]  folic acid (FOLVITE) 1 MG tablet Take 1 mg by mouth daily.    [provider]  furosemide (LASIX) 20 MG tablet Take 1 tablet (20 mg total) by mouth daily. 12/05/21   Darlin Drop, DO  methotrexate (RHEUMATREX) 2.5 MG tablet Take 15 mg by mouth every Monday.    [provider]  montelukast (SINGULAIR) 10 MG tablet Take 10 mg by mouth at bedtime.    [provider]  nitroGLYCERIN (NITROSTAT) 0.4 MG SL tablet Place 1 tablet (0.4 mg total) under the tongue every 5 (five) minutes as  needed for chest pain. 11/12/22   Esaw Grandchild A, DO  potassium chloride SA (KLOR-CON M) 20 MEQ tablet Take 1 tablet (20 mEq total) by mouth 2 (two) times daily. 08/15/23   Rickard Patience, MD  predniSONE (DELTASONE) 5 MG tablet Take 5 mg by mouth daily with breakfast.    [provider]  sacubitril-valsartan (ENTRESTO) 49-51 MG Take 1 tablet by mouth 2 (two) times daily. 05/16/23   Delma Freeze, FNP      VITAL SIGNS:  Blood pressure (!) 151/94, pulse 71, temperature (!) 97 F (36.1 C), temperature source Oral, resp. rate (!) 26, SpO2 98%.  PHYSICAL EXAMINATION:  Physical Exam  GENERAL:  77 y.o.-year-old patient lying in the bed with no acute  distress.  EYES: Pupils equal, round, reactive to light and accommodation. No scleral icterus. Extraocular muscles intact.  HEENT: Head atraumatic, normocephalic. Oropharynx and nasopharynx clear.  NECK:  Supple, no jugular venous distention. No thyroid enlargement, no tenderness.  LUNGS: Diminished bibasal breath sounds with bibasal rales.  No use of accessory muscles of respiration.  CARDIOVASCULAR: Regular rate and rhythm, S1, S2 normal. No murmurs, rubs, or gallops.  ABDOMEN: Soft, nondistended, nontender. Bowel sounds present. No organomegaly or mass.  EXTREMITIES: 1-2+ bilateral lower extremity pitting edema with no clubbing or cyanosis.Marland Kitchen  NEUROLOGIC: Cranial nerves II through XII are intact. Muscle strength 5/5 in all extremities. Sensation intact. Gait not checked.  PSYCHIATRIC: The patient is alert and oriented x 3.  Normal affect and good eye contact. SKIN: No obvious rash, lesion, or ulcer.   LABORATORY PANEL:   CBC Recent Labs  Lab 08/18/23 1542  WBC 4.3  HGB 12.3  HCT 39.6  PLT 231   ------------------------------------------------------------------------------------------------------------------  Chemistries  Recent Labs  Lab 08/15/23 0842 08/16/23 1337 08/18/23 1803  NA 143   < > 142  K 2.9*   < > 4.6  CL 109    < > 113*  CO2 28   < > 24  GLUCOSE 120*   < > 91  BUN 19   < > 18  CREATININE 0.78   < > 0.70  CALCIUM 8.4*   < > 7.5*  AST 12*  --   --   ALT 12  --   --   ALKPHOS 57  --   --   BILITOT 1.3*  --   --    < > = values in this interval not displayed.   ------------------------------------------------------------------------------------------------------------------  Cardiac Enzymes No results for input(s): "TROPONINI" in the last 168 hours. ------------------------------------------------------------------------------------------------------------------  RADIOLOGY:  DG Chest 2 View Result Date: 08/18/2023 CLINICAL DATA:  COPD EXAM: CHEST - 2 VIEW COMPARISON:  06/05/2023 FINDINGS: Electronic device overlies the anterior left chest wall. Cardiomegaly. Aortic atherosclerosis. Pulmonary vascular congestion. Diffuse bilateral interstitial prominence. Small right and trace left pleural effusions. No pneumothorax. IMPRESSION: 1. Cardiomegaly with pulmonary vascular congestion and diffuse bilateral interstitial prominence, suggestive of pulmonary edema. 2. Small right and trace left pleural effusions. Electronically Signed   By: Duanne Guess D.O.   On: 08/18/2023 17:24      IMPRESSION AND PLAN:  Assessment and Plan: * Acute on chronic combined CHF  -The patient will be admitted to a cardiac telemetry bed. - We will continue diuresis with IV Lasix. - We will continue [place] and Coreg. - We Will follow serial troponins. - We will follow I's and O's and daily weights. - Cardiology consult be obtained. - I notified Dr. Juliann Pares about the patient.   Acute on chronic respiratory failure with hypoxia (HCC) - This is clearly secondary to #1 1. - O2 protocol will be followed. - Management otherwise as above.  Coronary artery disease - We will continue Imdur and as needed sublingual nitroglycerin as well as Coreg and statin therapy.  Hypokalemia - Potassium will be placed and  magnesium level will be checked.  Dyslipidemia - We will continue statin therapy.  Rheumatoid arthritis, unspecified (HCC) - The patient is on weekly methotrexate. - We will continue daily Plaquenil.  History of pulmonary embolism - We will continue Eliquis.  Asthma, chronic - She will be placed on as needed DuoNebs.  OSA (obstructive sleep apnea) - We will utilize CPAP nightly as tolerated.    DVT  prophylaxis: Eliquis Advanced Care Planning:  Code Status: The patient is DNR and DNI.  This was discussed with her. Family Communication:  The plan of care was discussed in details with the patient (and family). I answered all questions. The patient agreed to proceed with the above mentioned plan. Further management will depend upon hospital course. Disposition Plan: Back to previous home environment Consults called: Cardiology. All the records are reviewed and case discussed with ED provider.  Status is: Inpatient    At the time of the admission, it appears that the appropriate admission status for this patient is inpatient.  This is judged to be reasonable and necessary in order to provide the required intensity of service to ensure the patient's safety given the presenting symptoms, physical exam findings and initial radiographic and laboratory data in the context of comorbid conditions.  The patient requires inpatient status due to high intensity of service, high risk of further deterioration and high frequency of surveillance required.  I certify that at the time of admission, it is my clinical judgment that the patient will require inpatient hospital care extending more than 2 midnights.                            Dispo: The patient is from: Home              Anticipated d/c is to: Home              Patient currently is not medically stable to d/c.              Difficult to place patient: No  Hannah Beat M.D on 08/18/2023 at 11:23 PM  [facility]   From 7 PM-7 AM,  contact night-coverage [email or web address]  CC: Primary care physician; Oswaldo Conroy, MD

## 2023-08-18 NOTE — ED Notes (Signed)
This NT assisted pt to the restroom. Pt is now comfortably resting in bed.

## 2023-08-18 NOTE — Assessment & Plan Note (Signed)
-   We will continue statin therapy. 

## 2023-08-18 NOTE — Assessment & Plan Note (Signed)
-  Potassium will be placed and magnesium level will be checked. ?

## 2023-08-18 NOTE — Assessment & Plan Note (Addendum)
-   The patient is on weekly methotrexate. - We will continue daily Plaquenil.

## 2023-08-18 NOTE — Assessment & Plan Note (Signed)
-   We will continue Eliquis. 

## 2023-08-18 NOTE — Assessment & Plan Note (Signed)
-   We will continue Imdur and as needed sublingual nitroglycerin as well as Coreg and statin therapy.

## 2023-08-18 NOTE — ED Provider Notes (Signed)
[facility] Provider Note    Event Date/Time   First MD Initiated Contact with Patient 08/18/23 1752     (approximate)   History   Shortness of Breath   HPI  Tina Fields is a 77 year old female with history of CHF on 2 L home O2, asthma with most recent EF of 20-25%, HTN, PE on Eliquis, CAD presenting to the emergency department for evaluation of shortness of breath.  Patient reports that for the past few days she has had progressive shortness of breath.  Felt like her legs were swollen yesterday, but does think this is improved today.  Reports compliance with her medications including Lasix, but does not feel that she has been peeing as much.  Denies wheezing or concerns for asthma exacerbation.  Did review patient's outpatient cardiology visit from 12/12.  At that time, was noted to have lower extremity edema, but seem to be improving on Lasix, with plans for continued outpatient follow-up.      Physical Exam   Triage Vital Signs: ED Triage Vitals  Encounter Vitals Group     BP 08/18/23 1538 (!) 140/82     Systolic BP Percentile --      Diastolic BP Percentile --      Pulse Rate 08/18/23 1538 81     Resp 08/18/23 1538 17     Temp 08/18/23 1538 98.2 F (36.8 C)     Temp Source 08/18/23 1538 Oral     SpO2 08/18/23 1538 97 %     Weight --      Height --      Head Circumference --      Peak Flow --      Pain Score 08/18/23 1539 0     Pain Loc --      Pain Education --      Exclude from Growth Chart --     Most recent vital signs: Vitals:   08/18/23 1830 08/18/23 2013  BP: (!) 151/94   Pulse: 71   Resp: (!) 26   Temp:  (!) 97 F (36.1 C)  SpO2: 98%      General: Awake, interactive  CV:  Regular rate, good peripheral perfusion Resp:  Lung sounds diminished bilaterally with bibasilar crackles, tachypneic with somewhat labored respirations, satting in the upper 90s on 3 L Abd:  Nondistended.  Neuro:  Symmetric facial  movement, fluid speech   ED Results / Procedures / Treatments   Labs (all labs ordered are listed, but only abnormal results are displayed) Labs Reviewed  CBC - Abnormal; Notable for the following components:      Result Value   RBC 3.72 (*)    MCV 106.5 (*)    RDW 15.6 (*)    All other components within normal limits  BASIC METABOLIC PANEL - Abnormal; Notable for the following components:   Chloride 113 (*)    Calcium 7.5 (*)    All other components within normal limits  BRAIN NATRIURETIC PEPTIDE - Abnormal; Notable for the following components:   B Natriuretic Peptide 2,610.1 (*)    All other components within normal limits  MAGNESIUM  BASIC METABOLIC PANEL  CBC  TROPONIN I (HIGH SENSITIVITY)  TROPONIN I (HIGH SENSITIVITY)     EKG EKG independently reviewed interpreted by myself (ER attending) demonstrates:  EKG demonstrates normal sinus rhythm at a rate of 71, PR 144, QRS 88, QTc 410, nonspecific ST changes noted  RADIOLOGY Imaging independently reviewed and interpreted by  myself demonstrates:  Chest x-Alton Bouknight concerning for pulmonary edema  PROCEDURES:  Critical Care performed: Yes, see critical care procedure note(s)  CRITICAL CARE Performed by: Trinna Post   Total critical care time: 31 minutes  Critical care time was exclusive of separately billable procedures and treating other patients.  Critical care was necessary to treat or prevent imminent or life-threatening deterioration.  Critical care was time spent personally by me on the following activities: development of treatment plan with patient and/or surrogate as well as nursing, discussions with consultants, evaluation of patient's response to treatment, examination of patient, obtaining history from patient or surrogate, ordering and performing treatments and interventions, ordering and review of laboratory studies, ordering and review of radiographic studies, pulse oximetry and re-evaluation of patient's  condition.   Procedures   MEDICATIONS ORDERED IN ED: Medications  methotrexate (RHEUMATREX) tablet 15 mg (has no administration in time range)  atorvastatin (LIPITOR) tablet 40 mg (has no administration in time range)  carvedilol (COREG) tablet 6.25 mg (6.25 mg Oral Given 08/18/23 2255)  nitroGLYCERIN (NITROSTAT) SL tablet 0.4 mg (has no administration in time range)  sacubitril-valsartan (ENTRESTO) 49-51 mg per tablet (1 tablet Oral Given 08/18/23 2256)  dapagliflozin propanediol (FARXIGA) tablet 10 mg (has no administration in time range)  predniSONE (DELTASONE) tablet 5 mg (has no administration in time range)  apixaban (ELIQUIS) tablet 2.5 mg (2.5 mg Oral Given 08/18/23 2255)  cyanocobalamin (VITAMIN B12) tablet 1,000 mcg (has no administration in time range)  folic acid (FOLVITE) tablet 1 mg (1 mg Oral Given 08/18/23 2255)  potassium chloride SA (KLOR-CON M) CR tablet 20 mEq (20 mEq Oral Given 08/18/23 2255)  furosemide (LASIX) injection 40 mg (has no administration in time range)  acetaminophen (TYLENOL) tablet 650 mg (650 mg Oral Given 08/18/23 2255)    Or  acetaminophen (TYLENOL) suppository 650 mg ( Rectal See Alternative 08/18/23 2255)  traZODone (DESYREL) tablet 25 mg (has no administration in time range)  magnesium hydroxide (MILK OF MAGNESIA) suspension 30 mL (has no administration in time range)  ondansetron (ZOFRAN) tablet 4 mg (has no administration in time range)    Or  ondansetron (ZOFRAN) injection 4 mg (has no administration in time range)  ipratropium-albuterol (DUONEB) 0.5-2.5 (3) MG/3ML nebulizer solution 3 mL (has no administration in time range)  potassium chloride (KLOR-CON) packet 40 mEq (has no administration in time range)  furosemide (LASIX) injection 40 mg (40 mg Intravenous Given 08/18/23 1955)     IMPRESSION / MDM / ASSESSMENT AND PLAN / ED COURSE  I reviewed the triage vital signs and the nursing notes.  Differential diagnosis includes, but is  not limited to, CHF exacerbation, lower suspicion asthma exacerbation, pneumonia, pneumothorax, lower suspicion ACS, PE  Patient's presentation is most consistent with acute presentation with potential threat to life or bodily function.  77 year old female presenting to the emergency department for evaluation of shortness of breath.  Mild hypoxia on home 2 L with EMS, increased to 3 L satting in the 90s, but does have some labored respirations.  Labs notable for significantly elevated BNP at 2600, as previously similarly elevated in the setting of CHF exacerbation.  Given labored respirations and hypoxia on home oxygen, do think patient is appropriate for inpatient management of her CHF exacerbation.  Ordered for dose of IV Lasix.  Will reach out to hospitalist team.  Case reviewed with Dr. Arville Care with hospitalist team who will evaluate the patient for anticipated admission.      FINAL CLINICAL IMPRESSION(S) /  ED DIAGNOSES   Final diagnoses:  Acute on chronic congestive heart failure, unspecified heart failure type (HCC)     Rx / DC Orders   ED Discharge Orders     None        Note:  This document was prepared using Dragon voice recognition software and may include unintentional dictation errors.   Trinna Post, MD 08/18/23 2352

## 2023-08-18 NOTE — Assessment & Plan Note (Addendum)
-  The patient will be admitted to a cardiac telemetry bed. - We will continue diuresis with IV Lasix. - We will continue [place] and Coreg. - We Will follow serial troponins. - We will follow I's and O's and daily weights. - Cardiology consult be obtained. - I notified Dr. Juliann Pares about the patient.

## 2023-08-19 DIAGNOSIS — I5043 Acute on chronic combined systolic (congestive) and diastolic (congestive) heart failure: Secondary | ICD-10-CM | POA: Diagnosis not present

## 2023-08-19 LAB — CBC
HCT: 40.7 % (ref 36.0–46.0)
Hemoglobin: 12.5 g/dL (ref 12.0–15.0)
MCH: 32.6 pg (ref 26.0–34.0)
MCHC: 30.7 g/dL (ref 30.0–36.0)
MCV: 106.3 fL — ABNORMAL HIGH (ref 80.0–100.0)
Platelets: 197 10*3/uL (ref 150–400)
RBC: 3.83 MIL/uL — ABNORMAL LOW (ref 3.87–5.11)
RDW: 15.4 % (ref 11.5–15.5)
WBC: 4.1 10*3/uL (ref 4.0–10.5)
nRBC: 0 % (ref 0.0–0.2)

## 2023-08-19 LAB — BASIC METABOLIC PANEL
Anion gap: 9 (ref 5–15)
BUN: 18 mg/dL (ref 8–23)
CO2: 25 mmol/L (ref 22–32)
Calcium: 8.3 mg/dL — ABNORMAL LOW (ref 8.9–10.3)
Chloride: 107 mmol/L (ref 98–111)
Creatinine, Ser: 0.87 mg/dL (ref 0.44–1.00)
GFR, Estimated: >60 mL/min (ref >60)
Glucose, Bld: 98 mg/dL (ref 70–99)
Potassium: 4.8 mmol/L (ref 3.5–5.1)
Sodium: 141 mmol/L (ref 135–145)

## 2023-08-19 NOTE — ED Notes (Signed)
Pt given dinner tray, pt sat up in bed, containers opened.

## 2023-08-19 NOTE — ED Notes (Signed)
Pt asking for lights turned off so she can sleep.

## 2023-08-19 NOTE — ED Notes (Addendum)
Pt brief changed after pt stating she feels wet again.

## 2023-08-19 NOTE — ED Notes (Signed)
Dr. Renford Dills at bedside

## 2023-08-19 NOTE — Progress Notes (Signed)
PROGRESS NOTE  Tina Fields  GEX:[id number] DOB: 06-23-1946 DOA: 08/18/2023 PCP: Oswaldo Conroy, MD   Brief Narrative: Patient is a 77 year old female with history of combined systolic/CHF, chronic hypoxic respiratory failure on 2 L of oxygen at home, coronary artery disease, PE on Eliquis, asthma, OSA, hypertension, GERD who presented with shortness of breath, lower extremity edema, orthopnea/PND, dry cough. On presentation, she was hemodynamically stable, requiring L of oxygen for maintenance of saturation.  Lab work showed elevated BNP, normal troponin.  Chest x-ray showed cardiomegaly with pulmonary vascular congestion, diffuse bilateral interstitial prominence suggestive of pulm edema.  As per report, she stopped taking Lasix 2 weeks ago a per her provider. Started on IV Lasix  Assessment & Plan:  Principal Problem:   Acute on chronic combined CHF Active Problems:   Acute on chronic respiratory failure with hypoxia (HCC)   Hypokalemia   Coronary artery disease   Dyslipidemia   Rheumatoid arthritis, unspecified (HCC)   History of pulmonary embolism   OSA (obstructive sleep apnea)   Asthma, chronic    Acute on chronic combined systolic/diastolic CHF: Presented with dyspnea, cough, bilateral lower extremity edema.  Appeared volume overloaded on presentation.  Chest x-ray showed features of volume overload.  Elevated BNP.  Takes Lasix oral at home but recently stopped 2 weeks ago as per her cardiology provider. Started on Lasix 40 mg IV twice daily here.  Continue Clifton Custard, Coreg.  Monitor input/output, daily weight.  Cardiology consulted. Her last echo on 11/12/2022 showed EF of 20 to 25%, grade 1 diastolic dysfunction, global hypokinesis  Acute on chronic hypoxic respiratory failure: On 2 L of oxygen at home.  Currently on 3L here  Gradually wean  Coronary artery disease: No anginal symptoms.  Continue Imdur, Coreg, statin  Hypokalemia: Will be monitored and  supplemented as needed  History of rheumatoid arthritis: On weekly methotrexate and Plaquenil,prednisone at home  History of PE: Continue Eliquis  Hyperlipidemia: On statin  Chronic asthma: Continue bronchodilators as needed.  OSA: On CPAP at night  Patient lives alone.  She says she ambulates normally well.  Might need PT evaluation when she is closer to discharge.         DVT prophylaxis:apixaban (ELIQUIS) tablet 2.5 mg Start: 08/18/23 2200 apixaban (ELIQUIS) tablet 2.5 mg     Code Status: Limited: Do not attempt resuscitation (DNR) -DNR-LIMITED -Do Not Intubate/DNI   Family Communication: Called cousin Dianna for update, call not received  Patient status:Inpatient  Patient is from :Home  Anticipated discharge WN:[id number]  Estimated DC date:after optimization of volume status  Consultants: Cardiology  Procedures: None  Antimicrobials:  Anti-infectives (From admission, onward)    None       Subjective: Patient seen and examined at bedside today.  During my evaluation, she was comfortable.  On 3 L of oxygen but denies any worsening shortness of breath or cough.  Has bilateral lower extremity edema.  Speaking in full sentences.  She says she feels better today  Objective: Vitals:   08/18/23 2013 08/19/23 0015 08/19/23 0400 08/19/23 0430  BP:  (!) 134/90 (!) 144/69   Pulse:  76 (!) 56   Resp:  (!) 25 (!) 37   Temp: (!) 97 F (36.1 C) (!) 97.3 F (36.3 C)  97.6 F (36.4 C)  TempSrc: Oral Oral  Oral  SpO2:  97% 99%    No intake or output data in the 24 hours ending 08/19/23 0719 There were no vitals filed for this visit.  Examination:  General exam: Overall comfortable, not in distress, pleasant elderly female HEENT: PERRL Respiratory system:bibasilar crackles  cardiovascular system: S1 & S2 heard, RRR.  Gastrointestinal system: Abdomen is nondistended, soft and nontender. Central nervous system: Alert and oriented Extremities: Bilateral lower  extremity pitting edema, no clubbing ,no cyanosis Skin: No rashes, no ulcers,no icterus     Data Reviewed: I have personally reviewed following labs and imaging studies  CBC: Recent Labs  Lab 08/15/23 0842 08/18/23 1542 08/19/23 0555  WBC 4.3 4.3 4.1  NEUTROABS 3.0  --   --   HGB 12.8 12.3 12.5  HCT 40.3 39.6 40.7  MCV 103.3* 106.5* 106.3*  PLT 207 231 197   Basic Metabolic Panel: Recent Labs  Lab 08/15/23 0842 08/16/23 1337 08/18/23 1803 08/19/23 0555  NA 143 142 142 141  K 2.9* 3.7 4.6 4.8  CL 109 108 113* 107  CO2 28 25 24 25   GLUCOSE 120* 100* 91 98  BUN 19 17 18 18   CREATININE 0.78 0.76 0.70 0.87  CALCIUM 8.4* 8.4* 7.5* 8.3*  MG  --   --  1.8  --      No results found for this or any previous visit (from the past 240 hours).   Radiology Studies: DG Chest 2 View Result Date: 08/18/2023 CLINICAL DATA:  COPD EXAM: CHEST - 2 VIEW COMPARISON:  06/05/2023 FINDINGS: Electronic device overlies the anterior left chest wall. Cardiomegaly. Aortic atherosclerosis. Pulmonary vascular congestion. Diffuse bilateral interstitial prominence. Small right and trace left pleural effusions. No pneumothorax. IMPRESSION: 1. Cardiomegaly with pulmonary vascular congestion and diffuse bilateral interstitial prominence, suggestive of pulmonary edema. 2. Small right and trace left pleural effusions. Electronically Signed   By: Duanne Guess D.O.   On: 08/18/2023 17:24    Scheduled Meds:  apixaban  2.5 mg Oral BID   atorvastatin  40 mg Oral q morning   carvedilol  6.25 mg Oral BID   [START ON 08/24/2023] cyanocobalamin  1,000 mcg Oral Weekly   dapagliflozin propanediol  10 mg Oral QAC breakfast   folic acid  1 mg Oral Daily   furosemide  40 mg Intravenous Q12H   [START ON 08/20/2023] methotrexate  15 mg Oral Q Mon   potassium chloride SA  20 mEq Oral BID   predniSONE  5 mg Oral Q breakfast   sacubitril-valsartan  1 tablet Oral BID   Continuous Infusions:   LOS: 1 day    Burnadette Pop, MD [facility] P12/15/2024, 7:19 AM

## 2023-08-19 NOTE — ED Notes (Signed)
Pt stating she is wet, pt sheet changed, new brief placed, purewick repositioned. Warm blankets given.

## 2023-08-19 NOTE — Consult Note (Signed)
CARDIOLOGY CONSULT NOTE               Patient ID: Tina Fields MRN: [id number] DOB/AGE: [birth year] 79 y.o.  Admit date: 08/18/2023 Referring Physician Dr. Valente David hospitalist Primary Physician Dr. Tonia Ghent Primary Cardiologist Dr. Darrold Junker Reason for Consultation congestive heart failure shortness of breath  HPI: 77 year old female combined systolic diastolic dysfunction respiratory failure on home O2 coronary disease previous PE on Eliquis she has had asthma.  No hypertension GERD hyperlipidemia came to emergency room worsening dyspnea lower extremity swelling she has had PND orthopnea dry cough weakness fatigue but no fever chills or sweats..  In the emergency room elevated BNP chest x-ray concerning for congestion EKG was nonspecific after evaluating patient today she feels much better than yesterday and has been improving on diuretic therapy  Review of systems complete and found to be negative unless listed above     Past Medical History:  Diagnosis Date   Aortic atherosclerosis (HCC)    Asthma    Chest pain 11/10/2022   CHF (congestive heart failure) (HCC)    Coronary artery disease    Dyspnea    Edema of both legs    GERD (gastroesophageal reflux disease)    HFrEF (heart failure with reduced ejection fraction) (HCC)    History of 2019 novel coronavirus disease (COVID-19) 10/28/2020   Hypertension    Myocardial infarction (HCC)    OSA (obstructive sleep apnea)    noncompliant with nocturnal PAP therapy   RA (rheumatoid arthritis) (HCC)     Past Surgical History:  Procedure Laterality Date   BREAST SURGERY     CARDIAC CATHETERIZATION Left 06/21/2005   Moderate ostial LCx and LAD disease; LVEF 45%; Location: [facility]; Surgeon: Adrian Blackwater, MD   CARDIAC CATHETERIZATION Left 05/20/2008   No occlusive CAD; LVEF 65%; Location: [facility]; Surgeon: Rudean Hitt, MD   COLONOSCOPY     ESOPHAGOGASTRODUODENOSCOPY     TUBAL LIGATION      (Not in a hospital  admission)  Social History   Socioeconomic History   Marital status: Widowed    Spouse name: Not on file   Number of children: Not on file   Years of education: Not on file   Highest education level: Not on file  Occupational History   Not on file  Tobacco Use   Smoking status: Former    Current packs/day: 0.00    Types: Cigarettes    Quit date: 01/09/1995    Years since quitting: 28.6   Smokeless tobacco: Never  Vaping Use   Vaping status: Never Used  Substance and Sexual Activity   Alcohol use: No   Drug use: No   Sexual activity: Yes    Birth control/protection: None  Other Topics Concern   Not on file  Social History Narrative   Adult son   Social Drivers of Health   Financial Resource Strain: Medium Risk (10/29/2020)   Received [facility], [facility]   Overall Financial Resource Strain (CARDIA)    Difficulty of Paying Living Expenses: Somewhat hard  Food Insecurity: No Food Insecurity (11/10/2022)   Hunger Vital Sign    Worried About Running Out of Food in the Last Year: Never true    Ran Out of Food in the Last Year: Never true  Transportation Needs: No Transportation Needs (11/10/2022)   PRAPARE - Administrator, Civil Service (Medical): No    Lack of Transportation (Non-Medical): No  Physical Activity: Unknown (08/21/2017)  Exercise Vital Sign    Days of Exercise per Week: Patient declined    Minutes of Exercise per Session: Patient declined  Stress: Unknown (08/21/2017)   [employer] of Occupational Health - Occupational Stress Questionnaire    Feeling of Stress : Patient declined  Social Connections: Unknown (08/21/2017)   Social Connection and Isolation Panel [NHANES]    Frequency of Communication with Friends and Family: Patient declined    Frequency of Social Gatherings with Friends and Family: Patient declined    Attends Religious Services: Patient declined    Database administrator or Organizations: Patient declined     Attends Banker Meetings: Patient declined    Marital Status: Patient declined  Intimate Partner Violence: Not At Risk (11/10/2022)   Humiliation, Afraid, Rape, and Kick questionnaire    Fear of Current or Ex-Partner: No    Emotionally Abused: No    Physically Abused: No    Sexually Abused: No    Family History  Problem Relation Age of Onset   CAD Mother    Diabetes Mother    Hypertension Mother    CAD Father    Diabetes Father    Hypertension Father    Stroke Father       Review of systems complete and found to be negative unless listed above      PHYSICAL EXAM  General: Well developed, well nourished, in no acute distress HEENT:  Normocephalic and atramatic Neck:  No JVD.  Lungs: Clear bilaterally to auscultation and percussion. Heart: HRRR . Normal S1 and S2 without gallops or murmurs.  Abdomen: Bowel sounds are positive, abdomen soft and non-tender  Msk:  Back normal, normal gait. Normal strength and tone for age. Extremities: No clubbing, cyanosis or edema.   Neuro: Alert and oriented X 3. Psych:  Good affect, responds appropriately  Labs:   Lab Results  Component Value Date   WBC 4.1 08/19/2023   HGB 12.5 08/19/2023   HCT 40.7 08/19/2023   MCV 106.3 (H) 08/19/2023   PLT 197 08/19/2023    Recent Labs  Lab 08/15/23 0842 08/16/23 1337 08/19/23 0555  NA 143   < > 141  K 2.9*   < > 4.8  CL 109   < > 107  CO2 28   < > 25  BUN 19   < > 18  CREATININE 0.78   < > 0.87  CALCIUM 8.4*   < > 8.3*  PROT 6.5  --   --   BILITOT 1.3*  --   --   ALKPHOS 57  --   --   ALT 12  --   --   AST 12*  --   --   GLUCOSE 120*   < > 98   < > = values in this interval not displayed.   Lab Results  Component Value Date   CKTOTAL 150 03/15/2013   CKMB 1.3 03/15/2013   TROPONINI <0.03 09/12/2018    Lab Results  Component Value Date   CHOL 107 08/16/2023   CHOL 154 03/03/2021   CHOL 174 08/21/2017   Lab Results  Component Value Date   HDL 45  08/16/2023   HDL 27 (L) 03/03/2021   HDL 28 (L) 08/21/2017   Lab Results  Component Value Date   LDLCALC 52 08/16/2023   LDLCALC 111 (H) 03/03/2021   LDLCALC 108 (H) 08/21/2017   Lab Results  Component Value Date   TRIG 49 08/16/2023   TRIG 82  03/03/2021   TRIG 190 (H) 08/21/2017   Lab Results  Component Value Date   CHOLHDL 2.4 08/16/2023   CHOLHDL 5.7 03/03/2021   CHOLHDL 6.2 08/21/2017   No results found for: "LDLDIRECT"    Radiology: DG Chest 2 View Result Date: 08/18/2023 CLINICAL DATA:  COPD EXAM: CHEST - 2 VIEW COMPARISON:  06/05/2023 FINDINGS: Electronic device overlies the anterior left chest wall. Cardiomegaly. Aortic atherosclerosis. Pulmonary vascular congestion. Diffuse bilateral interstitial prominence. Small right and trace left pleural effusions. No pneumothorax. IMPRESSION: 1. Cardiomegaly with pulmonary vascular congestion and diffuse bilateral interstitial prominence, suggestive of pulmonary edema. 2. Small right and trace left pleural effusions. Electronically Signed   By: Duanne Guess D.O.   On: 08/18/2023 17:24    EKG: Normal sinus rhythm nonspecific ST-T changes rate of 70  ASSESSMENT AND PLAN:  Acute on chronic congestive heart failure Acute on chronic respiratory failure Coronary artery disease Hyperlipidemia History of pulmonary embolus Hypokalemia Mild obesity Obstructive sleep apnea Shortness of breath Leg edema Asthma Cardiomyopathy Rheumatoid arthritis . Plan Agree with admit rule out myocardial infarction follow-up EKGs troponins and diuresis Echocardiogram for reassessment of ventricular function Previous echocardiogram was concerning for possible infiltrative process like amyloid consider cardiac MRI Continue IV diuresis supplemental oxygen Agree with inhalers consider pulmonary input Correct electrolytes Maintain heart failure medications Entresto Coreg Farxiga consider spironolactone as well as Lasix Recommend advanced heart  failure consult for inpatient management Continue statin therapy for hyperlipidemia Anticoagulation for previous history of pulmonary embolus DVT on Eliquis Obstructive sleep apnea recommend sleep study CPAP weight loss Continue coronary disease therapy with Imdur beta-blocker statin Patient states she is doing somewhat better with diuresis we will continue to follow  Signed: Alwyn Pea MD 08/19/2023, 11:52 AM

## 2023-08-19 NOTE — ED Notes (Signed)
Pt given sprite, HOB lowered. PT NAD, will continue to monitor.

## 2023-08-19 NOTE — ED Notes (Signed)
Pt stating she and bed are wet. Pt sheets and brief changed, warm blankets given. Suction container emptied as was full.

## 2023-08-19 NOTE — ED Notes (Signed)
Pt bedding and brief was changed. Purewick replaced. Pts posterior side intact and free of any pressure wounds.

## 2023-08-20 DIAGNOSIS — I5043 Acute on chronic combined systolic (congestive) and diastolic (congestive) heart failure: Secondary | ICD-10-CM | POA: Diagnosis not present

## 2023-08-20 LAB — BASIC METABOLIC PANEL
Anion gap: 8 (ref 5–15)
BUN: 21 mg/dL (ref 8–23)
CO2: 32 mmol/L (ref 22–32)
Calcium: 8.6 mg/dL — ABNORMAL LOW (ref 8.9–10.3)
Chloride: 100 mmol/L (ref 98–111)
Creatinine, Ser: 0.88 mg/dL (ref 0.44–1.00)
GFR, Estimated: >60 mL/min (ref >60)
Glucose, Bld: 111 mg/dL — ABNORMAL HIGH (ref 70–99)
Potassium: 4.2 mmol/L (ref 3.5–5.1)
Sodium: 140 mmol/L (ref 135–145)

## 2023-08-20 MED ORDER — FUROSEMIDE 10 MG/ML IJ SOLN
60.0000 mg | Freq: Two times a day (BID) | INTRAMUSCULAR | Status: DC
  Administered 2023-08-21: 60 mg via INTRAVENOUS
  Filled 2023-08-20 (×2): qty 6

## 2023-08-20 NOTE — ED Notes (Signed)
Communicated BP trend to consistently 90s/50s to Dr. Alvino Chapel. Pt continues to be A&Ox4, denies dizziness. No intervention needed at this time, instructed to hold PM lasix if continues to be soft.

## 2023-08-20 NOTE — Progress Notes (Signed)
Heart Failure Navigator Progress Note  Assessed for Heart & Vascular TOC clinic readiness.  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  Pt already had a Established Follow-Up appointment scheduled for 08/27/23 with Inetta Fermo.  Navigator will sign off at this time.  Roxy Horseman, RN, BSN [facility] Heart Failure Navigator Secure Chat Only

## 2023-08-20 NOTE — ED Notes (Signed)
Hospitalist team at bedside.

## 2023-08-20 NOTE — Progress Notes (Signed)
PROGRESS NOTE    Tina Fields  GNF:[id number] DOB: [birth year] DOA: 08/18/2023 PCP: Oswaldo Conroy, MD     Brief Narrative:  Tina Fields is a 77 y.o. female with medical history significant for combined systolic and diastolic CHF, chronic respiratory failure on home O2 at 2 L by nasal cannula, coronary artery disease, PE on Eliquis, asthma, OSA, essential hypertension and GERD, who presented to the emergency room with acute onset of worsening dyspnea for the past few days which has been significantly worse today.  She has been having worsening lower extremity edema  yesterday but has improved today.  She also admits to orthopnea and paroxysmal nocturnal dyspnea as well as dyspnea on exertion.  She has been having mild dry cough and occasional wheezing.  No fever or chills.  No chest pain or palpitations.  She has been taking her Lasix regularly but has not been urinating as usual.   New events last 24 hours / Subjective: Patient seen in the emergency department.  No new complaints.  Urine output recorded 2700 mL  Assessment & Plan:   Principal Problem:   Acute on chronic combined CHF Active Problems:   Acute on chronic respiratory failure with hypoxia (HCC)   Hypokalemia   Coronary artery disease   Dyslipidemia   Rheumatoid arthritis, unspecified (HCC)   History of pulmonary embolism   OSA (obstructive sleep apnea)   Asthma, chronic   Acute on chronic combined systolic and diastolic CHF -BNP 2610.1 -Cardiology following -IV Lasix -Entresto, Farxiga -Strict I's and O's, daily weight, fluid restriction diet  Acute on chronic hypoxemic respiratory failure -Uses 2 L nasal cannula at baseline  CAD, dyslipidemia -Lipitor  Rheumatoid arthritis -Methotrexate weekly, prednisone  History of PE -Eliquis  OSA -CPAP nightly  DVT prophylaxis:  apixaban (ELIQUIS) tablet 2.5 mg Start: 08/18/23 2200 apixaban (ELIQUIS) tablet 2.5 mg  Code Status:  DNR Family Communication: No family at bedside Disposition Plan: Home Status is: Inpatient Remains inpatient appropriate because: IV Lasix    Antimicrobials:  Anti-infectives (From admission, onward)    None        Objective: Vitals:   08/20/23 1100 08/20/23 1200 08/20/23 1218 08/20/23 1222  BP: (!) 95/44 97/60    Pulse:  84    Resp: 13 (!) 25  (!) 23  Temp:   97.7 F (36.5 C)   TempSrc:   Oral   SpO2: 96% 96%  96%    Intake/Output Summary (Last 24 hours) at 08/20/2023 1250 Last data filed at 08/19/2023 2219 Gross per 24 hour  Intake 600 ml  Output 1000 ml  Net -400 ml   There were no vitals filed for this visit.  Examination:  General exam: Appears calm and comfortable  Respiratory system: Clear to auscultation. Respiratory effort normal. No respiratory distress. No conversational dyspnea.  On 2 L nasal cannula O2 Cardiovascular system: S1 & S2 heard, RRR. No murmurs. No pedal edema. Gastrointestinal system: Abdomen is nondistended, soft and nontender. Normal bowel sounds heard. Central nervous system: Alert and oriented. No focal neurological deficits. Speech clear.  Extremities: Symmetric in appearance  Skin: No rashes, lesions or ulcers on exposed skin  Psychiatry: Judgement and insight appear normal. Mood & affect appropriate.   Data Reviewed: I have personally reviewed following labs and imaging studies  CBC: Recent Labs  Lab 08/15/23 0842 08/18/23 1542 08/19/23 0555  WBC 4.3 4.3 4.1  NEUTROABS 3.0  --   --   HGB 12.8 12.3 12.5  HCT  40.3 39.6 40.7  MCV 103.3* 106.5* 106.3*  PLT 207 231 197   Basic Metabolic Panel: Recent Labs  Lab 08/15/23 0842 08/16/23 1337 08/18/23 1803 08/19/23 0555 08/20/23 0526  NA 143 142 142 141 140  K 2.9* 3.7 4.6 4.8 4.2  CL 109 108 113* 107 100  CO2 28 25 24 25  32  GLUCOSE 120* 100* 91 98 111*  BUN 19 17 18 18 21   CREATININE 0.78 0.76 0.70 0.87 0.88  CALCIUM 8.4* 8.4* 7.5* 8.3* 8.6*  MG  --   --  1.8  --    --    GFR: Estimated Creatinine Clearance: 55.7 mL/min (by C-G formula based on SCr of 0.88 mg/dL). Liver Function Tests: Recent Labs  Lab 08/15/23 0842  AST 12*  ALT 12  ALKPHOS 57  BILITOT 1.3*  PROT 6.5  ALBUMIN 3.2*   No results for input(s): "LIPASE", "AMYLASE" in the last 168 hours. No results for input(s): "AMMONIA" in the last 168 hours. Coagulation Profile: No results for input(s): "INR", "PROTIME" in the last 168 hours. Cardiac Enzymes: No results for input(s): "CKTOTAL", "CKMB", "CKMBINDEX", "TROPONINI" in the last 168 hours. BNP (last 3 results) No results for input(s): "PROBNP" in the last 8760 hours. HbA1C: No results for input(s): "HGBA1C" in the last 72 hours. CBG: No results for input(s): "GLUCAP" in the last 168 hours. Lipid Profile: No results for input(s): "CHOL", "HDL", "LDLCALC", "TRIG", "CHOLHDL", "LDLDIRECT" in the last 72 hours. Thyroid Function Tests: No results for input(s): "TSH", "T4TOTAL", "FREET4", "T3FREE", "THYROIDAB" in the last 72 hours. Anemia Panel: No results for input(s): "VITAMINB12", "FOLATE", "FERRITIN", "TIBC", "IRON", "RETICCTPCT" in the last 72 hours. Sepsis Labs: No results for input(s): "PROCALCITON", "LATICACIDVEN" in the last 168 hours.  No results found for this or any previous visit (from the past 240 hours).    Radiology Studies: DG Chest 2 View Result Date: 08/18/2023 CLINICAL DATA:  COPD EXAM: CHEST - 2 VIEW COMPARISON:  06/05/2023 FINDINGS: Electronic device overlies the anterior left chest wall. Cardiomegaly. Aortic atherosclerosis. Pulmonary vascular congestion. Diffuse bilateral interstitial prominence. Small right and trace left pleural effusions. No pneumothorax. IMPRESSION: 1. Cardiomegaly with pulmonary vascular congestion and diffuse bilateral interstitial prominence, suggestive of pulmonary edema. 2. Small right and trace left pleural effusions. Electronically Signed   By: Duanne Guess D.O.   On: 08/18/2023  17:24      Scheduled Meds:  apixaban  2.5 mg Oral BID   atorvastatin  40 mg Oral q morning   [START ON 08/24/2023] cyanocobalamin  1,000 mcg Oral Weekly   dapagliflozin propanediol  10 mg Oral QAC breakfast   folic acid  1 mg Oral Daily   furosemide  60 mg Intravenous Q12H   methotrexate  15 mg Oral Q Mon   potassium chloride SA  20 mEq Oral BID   predniSONE  5 mg Oral Q breakfast   sacubitril-valsartan  1 tablet Oral BID   Continuous Infusions:   LOS: 2 days   Time spent: 35 minutes   Noralee Stain, DO [facility] 08/20/2023, 12:50 PM   Available via Epic secure chat 7am-7pm After these hours, please refer to coverage provider listed on [email or web address]

## 2023-08-20 NOTE — Progress Notes (Addendum)
Heart Failure Stewardship Pharmacy Note  PCP: Oswaldo Conroy, MD PCP-Cardiologist: None  HPI: Tina Fields is a 77 y.o. female with combined systolic and diastolic CHF, chronic respiratory failure on home O2 at 2 L by nasal cannula, coronary artery disease, PE on Eliquis, asthma, OSA, essential hypertension and GERD who presented with worsening shortness of breath and orthopnea over several days. CXR noted pulmonary vascular congestion and bilateral interstitial prominence suggestive of pulmonary edema small effusion. HS-troponin normal on admission. BNP elevated to 2610.1 on admission.  Pertinent cardiac history: Echo 02/2021 with LVEF of 60-65% with moderate LVH and grade I diastolic dysfunction. Echo 01/2022 with LVEF down to 45-50%, grade I diastolic dysfunction, and mildly reduced RV function. Stress test in 11/2022 showed LVEF of 22% with evidence of infarction in the apical anterolateral and inferolateral locations. Echo in 11/2022 with LVEF 20-25%, possible infiltrative process.   Pertinent Lab Values: Creatinine  Date Value Ref Range Status  08/15/2023 0.78 0.44 - 1.00 mg/dL Final  59/56/3875 6.43 0.60 - 1.30 mg/dL Final   Creatinine, Ser  Date Value Ref Range Status  08/20/2023 0.88 0.44 - 1.00 mg/dL Final   BUN  Date Value Ref Range Status  08/20/2023 21 8 - 23 mg/dL Final  32/95/1884 15 8 - 27 mg/dL Final  16/60/6301 18 7 - 18 mg/dL Final   Potassium  Date Value Ref Range Status  08/20/2023 4.2 3.5 - 5.1 mmol/L Final  03/15/2013 3.5 3.5 - 5.1 mmol/L Final   Sodium  Date Value Ref Range Status  08/20/2023 140 135 - 145 mmol/L Final  07/19/2023 144 134 - 144 mmol/L Final  03/15/2013 141 136 - 145 mmol/L Final   B Natriuretic Peptide  Date Value Ref Range Status  08/18/2023 2,610.1 (H) 0.0 - 100.0 pg/mL Final    Comment:    Performed at [facility], [address] [place], [place] [address]   Magnesium  Date Value Ref Range Status   08/18/2023 1.8 1.7 - 2.4 mg/dL Final    Comment:    Performed at [facility], [address] [place], [place] [address]   Hgb A1c MFr Bld  Date Value Ref Range Status  08/21/2017 6.6 (H) 4.8 - 5.6 % Final    Comment:    (NOTE)         Prediabetes: 5.7 - 6.4         Diabetes: >6.4         Glycemic control for adults with diabetes: <7.0     Vital Signs: Admission weight: none Temp:  [98 F (36.7 C)-98.4 F (36.9 C)] 98.1 F (36.7 C) (12/16 0600) Pulse Rate:  [54-109] 76 (12/16 0900) Cardiac Rhythm: Normal sinus rhythm (12/16 1024) Resp:  [0-46] 17 (12/16 0900) BP: (106-131)/(55-88) 107/59 (12/16 0900) SpO2:  [91 %-100 %] 98 % (12/16 0900)  Intake/Output Summary (Last 24 hours) at 08/20/2023 1047 Last data filed at 08/19/2023 2219 Gross per 24 hour  Intake 600 ml  Output 1000 ml  Net -400 ml    Current Heart Failure Medications:  Loop diuretic: furosemide 40 mg IV q12h Beta-Blocker: carvedilol 6.25 mg bid ACEI/ARB/ARNI: Entresto 49-51 mg bid MRA: none SGLT2i: Farxiga 10 mg daily Other:    Prior to admission Heart Failure Medications:  Loop diuretic: furosemide 20 mg daily Beta-Blocker: carvedilol 6.25 mg bid ACEI/ARB/ARNI: Entresto 49-51 mg bid MRA: none SGLT2i: Farxiga 10 mg daily Other: isosorbide ER 30 mg daily  Assessment: 1. Acute on chronic combined systolic  and diastolic heart failure (LVEF 20-25%) with grade I diastolic dysfunction and mildly reduced RV failure , due to mixed ICM and NICM. NYHA class III-IV symptoms.  -Symptoms: Reports feeling fatigued, cold, short of breath, and has poor appetite.  -Volume: Appears hypervolemic on exam. Net negative on furosemide 40 mg IV BID. Given marked volume overload, would recommend 60 mg IV BID -Hemodynamics: BP was elevated yesterday, but has recently dropped this morning to 90s/40s. Pulse is 70s.  -BB: Currently on carvedilol 6.25 mg BID. Given degree of volume overload and symptoms can consider  reducing vs holding until euvolemic. -ACEI/ARB/ARNI: Home Entresto 49-51 mg BID restarted. Given BP is trending down, may need dose reduction, Will monitor trend. -MRA: Can consider adding spironolactone when BP trends back up. -SGLT2i: Continue Farxiga 10 mg daily - Cardiology considering CMRI to evaluate for infiltrative disease. Would also be useful to help rule out hydroxychloroquine cardiomyopathy.  Plan: 1) Medication changes recommended at this time: -Consider increasing furosemide to 60 mg IV BID -Consider reducing carvedilol to 3.125 mg BID  2) Patient assistance: -Pending  3) Education: - Patient has been educated on current HF medications and potential additions to HF medication regimen - Patient verbalizes understanding that over the next few months, these medication doses may change and more medications may be added to optimize HF regimen - Patient has been educated on basic disease state pathophysiology and goals of therapy  Medication Assistance / Insurance Benefits Check: Does the patient have prescription insurance?    Type of insurance plan:  Does the patient qualify for medication assistance through manufacturers or grants? Pending   Outpatient Pharmacy[facility]     Please do not hesitate to reach out with questions or concerns,  Enos Fling, PharmD, CPP, BCPS Heart Failure Pharmacist  Phone - [phone] 08/20/2023 11:25 AM

## 2023-08-20 NOTE — Progress Notes (Signed)
[facility] CLINIC CARDIOLOGY PROGRESS NOTE   Patient ID: Rania Cottom MRN: [id number] DOB/AGE: [birth year] 77 y.o.  Admit date: 08/18/2023 Referring Physician Dr. Valente David Primary Physician Hessie Diener, Earl Lagos, MD  Primary Cardiologist Dr. Darrold Junker Reason for Consultation AoCHF  HPI: Dariya Cantrell is a 77 y.o. female with a past medical history of chronic diastolic congestive heart failure, insignificant coronary artery disease by prior cardiac catheterization 06/11/2005 and 05/20/2008, essential hypertension and obstructive sleep apnea who presented to the ED on 08/18/2023 for SOB and LE edema. Cardiology was consulted for further evaluation.  Interval History:  -Patient seen and examined this morning, continues to report shortness of breath.  States this is mildly improved if at all. -Reports good UOP with IV diuresis.  Remains with lower extremity edema. -BP and heart rate have remained stable.  Review of systems complete and found to be negative unless listed above    Vitals:   08/20/23 0600 08/20/23 0616 08/20/23 0700 08/20/23 0800  BP:  126/67 124/61 117/68  Pulse:  77  78  Resp:  18 (!) 27 (!) 31  Temp: 98.1 F (36.7 C)     TempSrc: Oral     SpO2:  96% 94% 95%     Intake/Output Summary (Last 24 hours) at 08/20/2023 1013 Last data filed at 08/19/2023 2219 Gross per 24 hour  Intake 600 ml  Output 1000 ml  Net -400 ml     PHYSICAL EXAM General: Chronically ill appearing female, well nourished, in no acute distress. HEENT: Normocephalic and atraumatic. Neck: No JVD.  Lungs: Normal respiratory effort on 2L [place]. Clear bilaterally to auscultation. No wheezes, crackles, rhonchi.  Heart: HRRR. Normal S1 and S2 without gallops or murmurs. Radial & DP pulses 2+ bilaterally. Abdomen: Non-distended appearing.  Msk: Normal strength and tone for age. Extremities: No clubbing, cyanosis.  Mild edema noted bilaterally Neuro: Alert and oriented X 3. Psych: Mood  appropriate, affect congruent.    LABS: Basic Metabolic Panel: Recent Labs    08/18/23 1803 08/19/23 0555 08/20/23 0526  NA 142 141 140  K 4.6 4.8 4.2  CL 113* 107 100  CO2 24 25 32  GLUCOSE 91 98 111*  BUN 18 18 21   CREATININE 0.70 0.87 0.88  CALCIUM 7.5* 8.3* 8.6*  MG 1.8  --   --    Liver Function Tests: No results for input(s): "AST", "ALT", "ALKPHOS", "BILITOT", "PROT", "ALBUMIN" in the last 72 hours. No results for input(s): "LIPASE", "AMYLASE" in the last 72 hours. CBC: Recent Labs    08/18/23 1542 08/19/23 0555  WBC 4.3 4.1  HGB 12.3 12.5  HCT 39.6 40.7  MCV 106.5* 106.3*  PLT 231 197   Cardiac Enzymes: Recent Labs    08/18/23 1542 08/18/23 1803  TROPONINIHS 10 10   BNP: Recent Labs    08/18/23 1803  BNP 2,610.1*   D-Dimer: No results for input(s): "DDIMER" in the last 72 hours. Hemoglobin A1C: No results for input(s): "HGBA1C" in the last 72 hours. Fasting Lipid Panel: No results for input(s): "CHOL", "HDL", "LDLCALC", "TRIG", "CHOLHDL", "LDLDIRECT" in the last 72 hours. Thyroid Function Tests: No results for input(s): "TSH", "T4TOTAL", "T3FREE", "THYROIDAB" in the last 72 hours.  Invalid input(s): "FREET3" Anemia Panel: No results for input(s): "VITAMINB12", "FOLATE", "FERRITIN", "TIBC", "IRON", "RETICCTPCT" in the last 72 hours.  DG Chest 2 View Result Date: 08/18/2023 CLINICAL DATA:  COPD EXAM: CHEST - 2 VIEW COMPARISON:  06/05/2023 FINDINGS: Electronic device overlies the anterior left chest wall.  Cardiomegaly. Aortic atherosclerosis. Pulmonary vascular congestion. Diffuse bilateral interstitial prominence. Small right and trace left pleural effusions. No pneumothorax. IMPRESSION: 1. Cardiomegaly with pulmonary vascular congestion and diffuse bilateral interstitial prominence, suggestive of pulmonary edema. 2. Small right and trace left pleural effusions. Electronically Signed   By: Duanne Guess D.O.   On: 08/18/2023 17:24   ECHO  11/2022: 1. Consider infiltrative process ie.Marland Kitchen amyloid /sarcoid.   2. Ischemic cardiomyopathy can't be excluded.   3. Severely depressed LVF worse since previous study 5/23.   4. Left ventricular ejection fraction, by estimation, is 20 to 25%. The left ventricle has severely decreased function. The left ventricle demonstrates global hypokinesis. The left ventricular internal cavity size was mildly to moderately dilated. Left ventricular diastolic parameters are consistent with Grade I diastolic dysfunction (impaired relaxation).   5. Right ventricular systolic function is mildly reduced. The right ventricular size is mildly enlarged. Mildly increased right ventricular wall thickness.   6. Left atrial size was mildly dilated.   7. Right atrial size was mildly dilated.   8. The mitral valve is degenerative. Mild mitral valve regurgitation.   9. The aortic valve is calcified. Aortic valve regurgitation is trivial. Aortic valve sclerosis/calcification is present, without any evidence of aortic stenosis.   TELEMETRY reviewed by me 08/20/23: NSR PVCs rate 70s  EKG reviewed by me 08/20/23: Normal sinus rhythm nonspecific ST-T changes rate of 70   DATA reviewed by me 08/20/23: last 24h vitals tele labs imaging I/O, hospitalist progress note  Principal Problem:   Acute on chronic combined CHF Active Problems:   Rheumatoid arthritis, unspecified (HCC)   OSA (obstructive sleep apnea)   History of pulmonary embolism   Hypokalemia   Dyslipidemia   Coronary artery disease   Acute on chronic respiratory failure with hypoxia (HCC)   Asthma, chronic    ASSESSMENT AND PLAN: Merriam Reveal is a 77 y.o. female with a past medical history of chronic diastolic congestive heart failure, insignificant coronary artery disease by prior cardiac catheterization 06/11/2005 and 05/20/2008, essential hypertension and obstructive sleep apnea who presented to the ED on 08/18/2023 for SOB and LE edema. Cardiology  was consulted for further evaluation.  # Acute on chronic HFrEF # Acute on chronic hypoxic respiratory failure Patient presented to the ED with worsening shortness of breath.  BNP 2600.  Chest x-ray with pulmonary vascular congestion as well as small right and trace left pleural effusions.  Started on IV diuresis in the ED. -Lasix to 60 mg twice daily. -Will hold beta-blocker at this time given acute heart failure.  Continue GDMT with Entresto 49-51 mg twice daily, Farxiga 10 mg daily.  Consider further additions to GDMT pending BP and renal function.  # Coronary artery disease # Hyperlipidemia History of insignificant coronary disease by prior cath.  -Continue atorvastatin 40 mg daily.  # Obstructive sleep apnea With history of OSA, reports compliance with CPAP -Continue CPAP nightly.   This patient's case was discussed and created with Dr. Corky Sing and he is in agreement.  Signed:  Gale Journey, PA-C  08/20/2023, 10:13 AM [facility] Cardiology

## 2023-08-21 ENCOUNTER — Telehealth: Payer: Self-pay | Admitting: Family

## 2023-08-21 DIAGNOSIS — I5043 Acute on chronic combined systolic (congestive) and diastolic (congestive) heart failure: Secondary | ICD-10-CM | POA: Diagnosis not present

## 2023-08-21 LAB — BASIC METABOLIC PANEL
Anion gap: 10 (ref 5–15)
BUN: 31 mg/dL — ABNORMAL HIGH (ref 8–23)
CO2: 28 mmol/L (ref 22–32)
Calcium: 8.3 mg/dL — ABNORMAL LOW (ref 8.9–10.3)
Chloride: 100 mmol/L (ref 98–111)
Creatinine, Ser: 1.03 mg/dL — ABNORMAL HIGH (ref 0.44–1.00)
GFR, Estimated: 55 mL/min — ABNORMAL LOW (ref >60)
Glucose, Bld: 110 mg/dL — ABNORMAL HIGH (ref 70–99)
Potassium: 4.2 mmol/L (ref 3.5–5.1)
Sodium: 138 mmol/L (ref 135–145)

## 2023-08-21 MED ORDER — CARVEDILOL 6.25 MG PO TABS
6.2500 mg | ORAL_TABLET | Freq: Two times a day (BID) | ORAL | Status: DC
  Administered 2023-08-21 – 2023-08-22 (×3): 6.25 mg via ORAL
  Filled 2023-08-21 (×3): qty 1

## 2023-08-21 MED ORDER — POTASSIUM CHLORIDE CRYS ER 20 MEQ PO TBCR
20.0000 meq | EXTENDED_RELEASE_TABLET | Freq: Every day | ORAL | Status: DC
Start: 2023-08-22 — End: 2023-08-22
  Administered 2023-08-22: 20 meq via ORAL
  Filled 2023-08-21: qty 1

## 2023-08-21 MED ORDER — FUROSEMIDE 40 MG PO TABS
40.0000 mg | ORAL_TABLET | Freq: Every day | ORAL | Status: DC
  Administered 2023-08-22: 40 mg via ORAL
  Filled 2023-08-21: qty 1

## 2023-08-21 NOTE — Plan of Care (Signed)

## 2023-08-21 NOTE — TOC Initial Note (Signed)
Transition of Care [facility]) - Initial/Assessment Note    Patient Details  Name: Tina Fields MRN: [id number] Date of Birth: [birth year]  Transition of Care [facility]) CM/SW Contact:    Margarito Liner, LCSW Phone Number: 08/21/2023, 11:16 AM  Clinical Narrative:  Readmission prevention screen complete. CSW met with patient. No supports at bedside. CSW introduced role and explained that discharge planning would be discussed. PCP is Tonia Ghent, MD. Her cousin drives her to appointments. [facility]. No issues obtaining medications. No home health prior to admission. SHe uses a RW as needed. She has home oxygen and thought it was through [place] but they only provided her CPAP. CSW called [employer] who confirmed they provide her home oxygen. Per attending physician, patient is requesting portable oxygen. Attending entered order for outpatient POC evaluation. [employer] liaison is aware. CSW tried calling patient to notify but her line was busy. Will try again later. No further concerns. CSW encouraged patient to contact CSW as needed. CSW will continue to follow patient for support and facilitate return home once stable. Her cousin will transport her home at discharge.                Expected Discharge Plan: Home/Self Care Barriers to Discharge: Continued Medical Work up   Patient Goals and CMS Choice            Expected Discharge Plan and Services     Post Acute Care Choice: NA Living arrangements for the past 2 months: Single Family Home                                      Prior Living Arrangements/Services Living arrangements for the past 2 months: Single Family Home Lives with:: Self Patient language and need for interpreter reviewed:: Yes Do you feel safe going back to the place where you live?: Yes      Need for Family Participation in Patient Care: Yes (Comment)   Current home services: DME Criminal Activity/Legal Involvement Pertinent to Current  Situation/Hospitalization: No - Comment as needed  Activities of Daily Living   ADL Screening (condition at time of admission) Independently performs ADLs?: Yes (appropriate for developmental age) Is the patient deaf or have difficulty hearing?: No Does the patient have difficulty seeing, even when wearing glasses/contacts?: No Does the patient have difficulty concentrating, remembering, or making decisions?: No  Permission Sought/Granted                  Emotional Assessment Appearance:: Appears stated age Attitude/Demeanor/Rapport: Engaged, Gracious Affect (typically observed): Accepting, Calm Orientation: : Oriented to Self, Oriented to Place, Oriented to  Time, Oriented to Situation Alcohol / Substance Use: Not Applicable Psych Involvement: No (comment)  Admission diagnosis:  Acute on chronic combined systolic and diastolic CHF (congestive heart failure) (HCC) [I50.43] Acute on chronic congestive heart failure, unspecified heart failure type (HCC) [I50.9] Patient Active Problem List   Diagnosis Date Noted   Coronary artery disease 08/18/2023   Acute on chronic respiratory failure with hypoxia (HCC) 08/18/2023   Asthma, chronic 08/18/2023   SOB (shortness of breath) 08/15/2023   Acute on chronic combined systolic and diastolic congestive heart failure (HCC) 11/26/2022   Dyslipidemia 11/10/2022   Essential hypertension 11/10/2022   Macrocytosis without anemia 02/21/2022   Physical deconditioning 02/01/2022   Pyuria 01/29/2022   AKI (acute kidney injury) (HCC) - mild 01/29/2022  History of pulmonary embolism 01/28/2022   Acute respiratory failure with hypoxia (HCC) 01/28/2022   Hypokalemia 01/28/2022   CAP (community acquired pneumonia) 01/28/2022   Acute diastolic CHF (congestive heart failure) (HCC) 12/02/2021   CAD 03/15/2021   Renal mass 03/15/2021   Elevated troponin 03/03/2021   NSTEMI (non-ST elevated myocardial infarction) (HCC) 03/02/2021   Acute on  chronic combined CHF 01/02/2021   OSA (obstructive sleep apnea) 10/28/2020   Vertigo 01/14/2020   Encounter for long-term (current) use of high-risk medication 02/18/2019   Mixed hyperlipidemia 12/03/2018   Prediabetes 09/03/2018   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   Hypotension 08/21/2017   GERD (gastroesophageal reflux disease) 08/21/2017   Nodule of right lung 05/21/2014   Rheumatoid arthritis, unspecified (HCC) 06/09/2011   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   PCP:  Oswaldo Conroy, MD [facility]acy[facility] [address] [place] - [address] [place] [address] Phone: [phone] Fax: [phone]     Social Drivers of Health (SDOH) Social History: SDOH Screenings   Food Insecurity: No Food Insecurity (08/20/2023)  Housing: Low Risk  (08/20/2023)  Transportation Needs: No Transportation Needs (08/20/2023)  Utilities: Not At Risk (08/20/2023)  Financial Resource Strain: Medium Risk (10/29/2020)   Received from [facility], [facility]  Physical Activity: Unknown (08/21/2017)  Social Connections: Unknown (08/21/2017)  Stress: Unknown (08/21/2017)  Tobacco Use: Medium Risk (08/18/2023)   SDOH Interventions:     Readmission Risk Interventions    08/21/2023   11:14 AM 11/28/2022    1:11 PM 12/04/2021    2:04 PM  Readmission Risk Prevention Plan  Transportation Screening Complete Complete Complete  PCP or Specialist Appt within 5-7 Days   Complete  PCP or Specialist Appt within 3-5 Days Complete Complete   Home Care Screening   Complete  Medication Review (RN CM)   Referral to Pharmacy  Social Work Consult for Recovery Care Planning/Counseling Complete Complete   Palliative Care Screening Not Applicable Not Applicable   Medication Review Oceanographer) Complete Complete

## 2023-08-21 NOTE — Progress Notes (Signed)
PROGRESS NOTE    Tina Fields  RJJ:[id number] DOB: 1946-05-09 DOA: 08/18/2023 PCP: Oswaldo Conroy, MD     Brief Narrative:  Tina Fields is a 77 y.o. female with medical history significant for combined systolic and diastolic CHF, chronic respiratory failure on home O2 at 2 L by nasal cannula, coronary artery disease, PE on Eliquis, asthma, OSA, essential hypertension and GERD, who presented to the emergency room with acute onset of worsening dyspnea for the past few days which has been significantly worse today.  She has been having worsening lower extremity edema  yesterday but has improved today.  She also admits to orthopnea and paroxysmal nocturnal dyspnea as well as dyspnea on exertion.  She has been having mild dry cough and occasional wheezing.  No fever or chills.  No chest pain or palpitations.  She has been taking her Lasix regularly but has not been urinating as usual.   New events last 24 hours / Subjective: Feeling well overall.  Breathing is about the same.  She is - output since admission.  Remains on 2 L nasal cannula O2, which is her baseline.  She is asking if we can arrange for her to have portable oxygen on discharge  Assessment & Plan:   Principal Problem:   Acute on chronic combined CHF Active Problems:   Acute on chronic respiratory failure with hypoxia (HCC)   Hypokalemia   Coronary artery disease   Dyslipidemia   Rheumatoid arthritis, unspecified (HCC)   History of pulmonary embolism   OSA (obstructive sleep apnea)   Asthma, chronic   Acute on chronic combined systolic and diastolic CHF -BNP 2610.1 -Cardiology following -IV Lasix --> PO starting tomorrow -Sherryll Burger, Farxiga, Coreg -Strict I's and O's, daily weight, fluid restriction diet  Acute on chronic hypoxemic respiratory failure -Uses 2 L nasal cannula at baseline  CAD, dyslipidemia -Lipitor  Rheumatoid arthritis -Methotrexate weekly, prednisone  History of  PE -Eliquis  OSA -CPAP nightly  DVT prophylaxis:  apixaban (ELIQUIS) tablet 2.5 mg Start: 08/18/23 2200 apixaban (ELIQUIS) tablet 2.5 mg  Code Status: DNR Family Communication: No family at bedside Disposition Plan: Home Status is: Inpatient Remains inpatient appropriate because: Transition to p.o. Lasix tomorrow.  Hopeful discharge home 12/18    Antimicrobials:  Anti-infectives (From admission, onward)    None        Objective: Vitals:   08/21/23 0003 08/21/23 0349 08/21/23 0407 08/21/23 0825  BP: 103/61  105/67 100/61  Pulse: 92  87 76  Resp: 18  18 16   Temp:   (!) 97.4 F (36.3 C) (!) 97.3 F (36.3 C)  TempSrc:   Oral   SpO2: 97%  95% 95%  Weight:  79 kg      Intake/Output Summary (Last 24 hours) at 08/21/2023 1205 Last data filed at 08/21/2023 1027 Gross per 24 hour  Intake 360 ml  Output 600 ml  Net -240 ml   Filed Weights   08/20/23 2017 08/21/23 0349  Weight: 79.4 kg 79 kg    Examination:  General exam: Appears calm and comfortable  Respiratory system: Clear to auscultation. Respiratory effort normal. No respiratory distress. No conversational dyspnea.  On 2 L nasal cannula O2 Cardiovascular system: S1 & S2 heard, RRR. No murmurs. No pedal edema. Gastrointestinal system: Abdomen is nondistended, soft and nontender. Normal bowel sounds heard. Central nervous system: Alert and oriented. No focal neurological deficits. Speech clear.  Extremities: Symmetric in appearance  Skin: No rashes, lesions or ulcers on exposed  skin  Psychiatry: Judgement and insight appear normal. Mood & affect appropriate.   Data Reviewed: I have personally reviewed following labs and imaging studies  CBC: Recent Labs  Lab 08/15/23 0842 08/18/23 1542 08/19/23 0555  WBC 4.3 4.3 4.1  NEUTROABS 3.0  --   --   HGB 12.8 12.3 12.5  HCT 40.3 39.6 40.7  MCV 103.3* 106.5* 106.3*  PLT 207 231 197   Basic Metabolic Panel: Recent Labs  Lab 08/16/23 1337 08/18/23 1803  08/19/23 0555 08/20/23 0526 08/21/23 0420  NA 142 142 141 140 138  K 3.7 4.6 4.8 4.2 4.2  CL 108 113* 107 100 100  CO2 25 24 25  32 28  GLUCOSE 100* 91 98 111* 110*  BUN 17 18 18 21  31*  CREATININE 0.76 0.70 0.87 0.88 1.03*  CALCIUM 8.4* 7.5* 8.3* 8.6* 8.3*  MG  --  1.8  --   --   --    GFR: Estimated Creatinine Clearance: 47 mL/min (A) (by C-G formula based on SCr of 1.03 mg/dL (H)). Liver Function Tests: Recent Labs  Lab 08/15/23 0842  AST 12*  ALT 12  ALKPHOS 57  BILITOT 1.3*  PROT 6.5  ALBUMIN 3.2*   No results for input(s): "LIPASE", "AMYLASE" in the last 168 hours. No results for input(s): "AMMONIA" in the last 168 hours. Coagulation Profile: No results for input(s): "INR", "PROTIME" in the last 168 hours. Cardiac Enzymes: No results for input(s): "CKTOTAL", "CKMB", "CKMBINDEX", "TROPONINI" in the last 168 hours. BNP (last 3 results) No results for input(s): "PROBNP" in the last 8760 hours. HbA1C: No results for input(s): "HGBA1C" in the last 72 hours. CBG: No results for input(s): "GLUCAP" in the last 168 hours. Lipid Profile: No results for input(s): "CHOL", "HDL", "LDLCALC", "TRIG", "CHOLHDL", "LDLDIRECT" in the last 72 hours. Thyroid Function Tests: No results for input(s): "TSH", "T4TOTAL", "FREET4", "T3FREE", "THYROIDAB" in the last 72 hours. Anemia Panel: No results for input(s): "VITAMINB12", "FOLATE", "FERRITIN", "TIBC", "IRON", "RETICCTPCT" in the last 72 hours. Sepsis Labs: No results for input(s): "PROCALCITON", "LATICACIDVEN" in the last 168 hours.  No results found for this or any previous visit (from the past 240 hours).    Radiology Studies: No results found.     Scheduled Meds:  apixaban  2.5 mg Oral BID   atorvastatin  40 mg Oral q morning   [START ON 08/24/2023] cyanocobalamin  1,000 mcg Oral Weekly   dapagliflozin propanediol  10 mg Oral QAC breakfast   folic acid  1 mg Oral Daily   [START ON 08/22/2023] furosemide  40 mg Oral  Daily   methotrexate  15 mg Oral Q Mon   potassium chloride SA  20 mEq Oral BID   predniSONE  5 mg Oral Q breakfast   sacubitril-valsartan  1 tablet Oral BID   Continuous Infusions:   LOS: 3 days   Time spent: 25 minutes   Noralee Stain, DO [facility] 08/21/2023, 12:05 PM   Available via Epic secure chat 7am-7pm After these hours, please refer to coverage provider listed on [email or web address]

## 2023-08-21 NOTE — Plan of Care (Signed)
  Problem: Elimination: Goal: Will not experience complications related to bowel motility Outcome: Progressing   Problem: Education: Goal: Ability to demonstrate management of disease process will improve Outcome: Progressing   Problem: Education: Goal: Ability to verbalize understanding of medication therapies will improve Outcome: Progressing   Problem: Cardiac: Goal: Ability to achieve and maintain adequate cardiopulmonary perfusion will improve Outcome: Progressing

## 2023-08-21 NOTE — Telephone Encounter (Signed)
Tina Fields lvm about saying pt had a heart monitor put on last week and pt is hospitalized and were wondering what should they do with monitor.   Called and noftied that [facility] applied pt's monitor and not [facility] HRT Failure. Notified that the note is for the pt to wear monitor 3-7 days but pt advised to call [facility] for confirmation.

## 2023-08-21 NOTE — Progress Notes (Signed)
Heart Failure Stewardship Pharmacy Note  PCP: Oswaldo Conroy, MD PCP-Cardiologist: None  HPI: Tina Fields is a 77 y.o. female with combined systolic and diastolic CHF, chronic respiratory failure on home O2 at 2 L by nasal cannula, coronary artery disease, PE on Eliquis, asthma, OSA, essential hypertension and GERD who presented with worsening shortness of breath and orthopnea over several days. CXR noted pulmonary vascular congestion and bilateral interstitial prominence suggestive of pulmonary edema small effusion. HS-troponin normal on admission. BNP elevated to 2610.1 on admission.  Pertinent cardiac history: Echo 02/2021 with LVEF of 60-65% with moderate LVH and grade I diastolic dysfunction. Echo 01/2022 with LVEF down to 45-50%, grade I diastolic dysfunction, and mildly reduced RV function. Stress test in 11/2022 showed LVEF of 22% with evidence of infarction in the apical anterolateral and inferolateral locations. Echo in 11/2022 with LVEF 20-25%, possible infiltrative process.   Pertinent Lab Values: Creatinine  Date Value Ref Range Status  08/15/2023 0.78 0.44 - 1.00 mg/dL Final  60/45/4098 1.19 0.60 - 1.30 mg/dL Final   Creatinine, Ser  Date Value Ref Range Status  08/21/2023 1.03 (H) 0.44 - 1.00 mg/dL Final   BUN  Date Value Ref Range Status  08/21/2023 31 (H) 8 - 23 mg/dL Final  14/78/2956 15 8 - 27 mg/dL Final  21/30/8657 18 7 - 18 mg/dL Final   Potassium  Date Value Ref Range Status  08/21/2023 4.2 3.5 - 5.1 mmol/L Final  03/15/2013 3.5 3.5 - 5.1 mmol/L Final   Sodium  Date Value Ref Range Status  08/21/2023 138 135 - 145 mmol/L Final  07/19/2023 144 134 - 144 mmol/L Final  03/15/2013 141 136 - 145 mmol/L Final   B Natriuretic Peptide  Date Value Ref Range Status  08/18/2023 2,610.1 (H) 0.0 - 100.0 pg/mL Final    Comment:    Performed at [facility], [address] [place], [place] [address]   Magnesium  Date Value Ref Range  Status  08/18/2023 1.8 1.7 - 2.4 mg/dL Final    Comment:    Performed at [facility], [address] [place], [place] [address]   Hgb A1c MFr Bld  Date Value Ref Range Status  08/21/2017 6.6 (H) 4.8 - 5.6 % Final    Comment:    (NOTE)         Prediabetes: 5.7 - 6.4         Diabetes: >6.4         Glycemic control for adults with diabetes: <7.0     Vital Signs: Admission weight: none Temp:  [97.4 F (36.3 C)-98 F (36.7 C)] 97.4 F (36.3 C) (12/17 0407) Pulse Rate:  [71-92] 87 (12/17 0407) Cardiac Rhythm: Normal sinus rhythm (12/16 1900) Resp:  [13-31] 18 (12/17 0407) BP: (91-120)/(44-99) 105/67 (12/17 0407) SpO2:  [90 %-98 %] 95 % (12/17 0407) Weight:  [79 kg (174 lb 2.6 oz)-79.4 kg (175 lb 0.7 oz)] 79 kg (174 lb 2.6 oz) (12/17 0349)  Intake/Output Summary (Last 24 hours) at 08/21/2023 0749 Last data filed at 08/20/2023 2000 Gross per 24 hour  Intake 240 ml  Output 600 ml  Net -360 ml    Current Heart Failure Medications:  Loop diuretic: furosemide 40 mg IV q12h Beta-Blocker: carvedilol 6.25 mg bid ACEI/ARB/ARNI: Entresto 49-51 mg bid MRA: none SGLT2i: Farxiga 10 mg daily Other:    Prior to admission Heart Failure Medications:  Loop diuretic: furosemide 20 mg daily Beta-Blocker: carvedilol 6.25 mg bid ACEI/ARB/ARNI: Entresto 49-51  mg bid MRA: none SGLT2i: Farxiga 10 mg daily Other: isosorbide ER 30 mg daily  Assessment: 1. Acute on chronic combined systolic and diastolic heart failure (LVEF 20-25%) with grade I diastolic dysfunction and mildly reduced RV failure , due to mixed ICM and NICM. NYHA class III-IV symptoms.  -Symptoms: Reports feeling a little better from yesterday. Appetite is still poor, but shortness of breath is improved. Did become somewhat short of breath moving from the best to the chair. Still has some orthopnea.  -Volume: Volume is somewhat improved. Wt is down 1 lb. BUN and creatinine increased slightly.  -Hemodynamics: BP is  improved from yesterday. Pulse is 70-80s.  -BB: Home carvedilol held given degree of volume overload and symptoms on admission. Can consider resuming before discharge. -ACEI/ARB/ARNI: Home Entresto 49-51 mg BID restarted. BP stable today.  -MRA: Can consider adding spironolactone when BP trends back up. -SGLT2i: Continue Farxiga 10 mg daily - Cardiology considering CMRI to evaluate for infiltrative disease. Would also be useful to help rule out hydroxychloroquine cardiomyopathy.  Plan: 1) Medication changes recommended at this time: -None  2) Patient assistance: -Pending  3) Education: - Patient has been educated on current HF medications and potential additions to HF medication regimen - Patient verbalizes understanding that over the next few months, these medication doses may change and more medications may be added to optimize HF regimen - Patient has been educated on basic disease state pathophysiology and goals of therapy  Medication Assistance / Insurance Benefits Check: Does the patient have prescription insurance?    Type of insurance plan:  Does the patient qualify for medication assistance through manufacturers or grants? Pending   Outpatient Pharmacy: [facility]     Please do not hesitate to reach out with questions or concerns,  Enos Fling, PharmD, CPP, BCPS Heart Failure Pharmacist  Phone - [phone] 08/21/2023 7:49 AM

## 2023-08-21 NOTE — Discharge Instructions (Signed)

## 2023-08-21 NOTE — Progress Notes (Signed)
[facility] CLINIC CARDIOLOGY PROGRESS NOTE   Patient ID: Tina Fields MRN: [id number] DOB/AGE: Jan 19, 1946 77 y.o.  Admit date: 08/18/2023 Referring Physician Dr. Valente David Primary Physician Hessie Diener, Earl Lagos, MD  Primary Cardiologist Dr. Darrold Junker Reason for Consultation AoCHF  HPI: Tina Fields is a 77 y.o. female with a past medical history of chronic diastolic congestive heart failure, insignificant coronary artery disease by prior cardiac catheterization 06/11/2005 and 05/20/2008, essential hypertension and obstructive sleep apnea who presented to the ED on 08/18/2023 for SOB and LE edema. Cardiology was consulted for further evaluation.  Interval History:  -Patient seen and examined this morning, states she feels about the same as yesterday.  -Feels breathing is ok, remains on baseline O2.  -Reports good UOP. Cr relatively stable.   Review of systems complete and found to be negative unless listed above    Vitals:   08/21/23 0003 08/21/23 0349 08/21/23 0407 08/21/23 0825  BP: 103/61  105/67 100/61  Pulse: 92  87 76  Resp: 18  18 16   Temp:   (!) 97.4 F (36.3 C) (!) 97.3 F (36.3 C)  TempSrc:   Oral   SpO2: 97%  95% 95%  Weight:  79 kg       Intake/Output Summary (Last 24 hours) at 08/21/2023 1205 Last data filed at 08/21/2023 1027 Gross per 24 hour  Intake 360 ml  Output 600 ml  Net -240 ml     PHYSICAL EXAM General: Chronically ill appearing female, well nourished, in no acute distress. HEENT: Normocephalic and atraumatic. Neck: No JVD.  Lungs: Normal respiratory effort on 2L [place]. Clear bilaterally to auscultation. No wheezes, crackles, rhonchi.  Heart: HRRR. Normal S1 and S2 without gallops or murmurs. Radial & DP pulses 2+ bilaterally. Abdomen: Non-distended appearing.  Msk: Normal strength and tone for age. Extremities: No clubbing, cyanosis.  Trace edema noted bilaterally Neuro: Alert and oriented X 3. Psych: Mood appropriate, affect  congruent.    LABS: Basic Metabolic Panel: Recent Labs    08/18/23 1803 08/19/23 0555 08/20/23 0526 08/21/23 0420  NA 142   < > 140 138  K 4.6   < > 4.2 4.2  CL 113*   < > 100 100  CO2 24   < > 32 28  GLUCOSE 91   < > 111* 110*  BUN 18   < > 21 31*  CREATININE 0.70   < > 0.88 1.03*  CALCIUM 7.5*   < > 8.6* 8.3*  MG 1.8  --   --   --    < > = values in this interval not displayed.   Liver Function Tests: No results for input(s): "AST", "ALT", "ALKPHOS", "BILITOT", "PROT", "ALBUMIN" in the last 72 hours. No results for input(s): "LIPASE", "AMYLASE" in the last 72 hours. CBC: Recent Labs    08/18/23 1542 08/19/23 0555  WBC 4.3 4.1  HGB 12.3 12.5  HCT 39.6 40.7  MCV 106.5* 106.3*  PLT 231 197   Cardiac Enzymes: Recent Labs    08/18/23 1542 08/18/23 1803  TROPONINIHS 10 10   BNP: Recent Labs    08/18/23 1803  BNP 2,610.1*   D-Dimer: No results for input(s): "DDIMER" in the last 72 hours. Hemoglobin A1C: No results for input(s): "HGBA1C" in the last 72 hours. Fasting Lipid Panel: No results for input(s): "CHOL", "HDL", "LDLCALC", "TRIG", "CHOLHDL", "LDLDIRECT" in the last 72 hours. Thyroid Function Tests: No results for input(s): "TSH", "T4TOTAL", "T3FREE", "THYROIDAB" in the last 72 hours.  Invalid input(s): "FREET3" Anemia Panel: No results for input(s): "VITAMINB12", "FOLATE", "FERRITIN", "TIBC", "IRON", "RETICCTPCT" in the last 72 hours.  No results found.  ECHO 11/2022: 1. Consider infiltrative process ie.Marland Kitchen amyloid /sarcoid.   2. Ischemic cardiomyopathy can't be excluded.   3. Severely depressed LVF worse since previous study 5/23.   4. Left ventricular ejection fraction, by estimation, is 20 to 25%. The left ventricle has severely decreased function. The left ventricle demonstrates global hypokinesis. The left ventricular internal cavity size was mildly to moderately dilated. Left ventricular diastolic parameters are consistent with Grade I diastolic  dysfunction (impaired relaxation).   5. Right ventricular systolic function is mildly reduced. The right ventricular size is mildly enlarged. Mildly increased right ventricular wall thickness.   6. Left atrial size was mildly dilated.   7. Right atrial size was mildly dilated.   8. The mitral valve is degenerative. Mild mitral valve regurgitation.   9. The aortic valve is calcified. Aortic valve regurgitation is trivial. Aortic valve sclerosis/calcification is present, without any evidence of aortic stenosis.   TELEMETRY reviewed by me 08/21/23: NSR PVCs rate 70s  EKG reviewed by me 08/21/23: Normal sinus rhythm nonspecific ST-T changes rate of 70   DATA reviewed by me 08/21/23: last 24h vitals tele labs imaging I/O, hospitalist progress note  Principal Problem:   Acute on chronic combined CHF Active Problems:   Rheumatoid arthritis, unspecified (HCC)   OSA (obstructive sleep apnea)   History of pulmonary embolism   Hypokalemia   Dyslipidemia   Coronary artery disease   Acute on chronic respiratory failure with hypoxia (HCC)   Asthma, chronic    ASSESSMENT AND PLAN: Tina Fields is a 77 y.o. female with a past medical history of chronic diastolic congestive heart failure, insignificant coronary artery disease by prior cardiac catheterization 06/11/2005 and 05/20/2008, essential hypertension and obstructive sleep apnea who presented to the ED on 08/18/2023 for SOB and LE edema. Cardiology was consulted for further evaluation.  # Acute on chronic HFrEF # Acute on chronic hypoxic respiratory failure Patient presented to the ED with worsening shortness of breath.  BNP 2600.  Chest x-ray with pulmonary vascular congestion as well as small right and trace left pleural effusions.  Started on IV diuresis in the ED. -Transition to po lasix 40 mg daily to start tomorrow.  -Resume home carvedilol 6.25 mg twice daily. Continue GDMT with Entresto 49-51 mg twice daily, Farxiga 10 mg daily.   Consider further additions to GDMT pending BP and renal function.  # Coronary artery disease # Hyperlipidemia History of insignificant coronary disease by prior cath.  -Continue atorvastatin 40 mg daily.  # Obstructive sleep apnea With history of OSA, reports compliance with CPAP -Continue CPAP nightly.   This patient's case was discussed and created with Dr. Corky Sing and he is in agreement.  Signed:  Gale Journey, PA-C  08/21/2023, 12:05 PM [facility] Cardiology

## 2023-08-22 DIAGNOSIS — I5043 Acute on chronic combined systolic (congestive) and diastolic (congestive) heart failure: Secondary | ICD-10-CM | POA: Diagnosis not present

## 2023-08-22 LAB — BASIC METABOLIC PANEL
Anion gap: 11 (ref 5–15)
BUN: 39 mg/dL — ABNORMAL HIGH (ref 8–23)
CO2: 28 mmol/L (ref 22–32)
Calcium: 8.7 mg/dL — ABNORMAL LOW (ref 8.9–10.3)
Chloride: 98 mmol/L (ref 98–111)
Creatinine, Ser: 1.11 mg/dL — ABNORMAL HIGH (ref 0.44–1.00)
GFR, Estimated: 51 mL/min — ABNORMAL LOW (ref >60)
Glucose, Bld: 119 mg/dL — ABNORMAL HIGH (ref 70–99)
Potassium: 4.5 mmol/L (ref 3.5–5.1)
Sodium: 137 mmol/L (ref 135–145)

## 2023-08-22 NOTE — Progress Notes (Signed)
Heart Failure Stewardship Pharmacy Note  PCP: Oswaldo Conroy, MD PCP-Cardiologist: None  HPI: Tina Fields is a 77 y.o. female with combined systolic and diastolic CHF, chronic respiratory failure on home O2 at 2 L by nasal cannula, coronary artery disease, PE on Eliquis, asthma, OSA, essential hypertension and GERD who presented with worsening shortness of breath and orthopnea over several days. CXR noted pulmonary vascular congestion and bilateral interstitial prominence suggestive of pulmonary edema small effusion. HS-troponin normal on admission. BNP elevated to 2610.1 on admission.  Pertinent cardiac history: Echo 02/2021 with LVEF of 60-65% with moderate LVH and grade I diastolic dysfunction. Echo 01/2022 with LVEF down to 45-50%, grade I diastolic dysfunction, and mildly reduced RV function. Stress test in 11/2022 showed LVEF of 22% with evidence of infarction in the apical anterolateral and inferolateral locations. Echo in 11/2022 with LVEF 20-25%, possible infiltrative process.   Pertinent Lab Values: Creatinine  Date Value Ref Range Status  08/15/2023 0.78 0.44 - 1.00 mg/dL Final  29/56/2130 8.65 0.60 - 1.30 mg/dL Final   Creatinine, Ser  Date Value Ref Range Status  08/22/2023 1.11 (H) 0.44 - 1.00 mg/dL Final   BUN  Date Value Ref Range Status  08/22/2023 39 (H) 8 - 23 mg/dL Final  78/46/9629 15 8 - 27 mg/dL Final  52/84/1324 18 7 - 18 mg/dL Final   Potassium  Date Value Ref Range Status  08/22/2023 4.5 3.5 - 5.1 mmol/L Final  03/15/2013 3.5 3.5 - 5.1 mmol/L Final   Sodium  Date Value Ref Range Status  08/22/2023 137 135 - 145 mmol/L Final  07/19/2023 144 134 - 144 mmol/L Final  03/15/2013 141 136 - 145 mmol/L Final   B Natriuretic Peptide  Date Value Ref Range Status  08/18/2023 2,610.1 (H) 0.0 - 100.0 pg/mL Final    Comment:    Performed at [facility], [address] [place], [place] [address]   Magnesium  Date Value Ref Range  Status  08/18/2023 1.8 1.7 - 2.4 mg/dL Final    Comment:    Performed at [facility], [address] [place], [place] [address]   Hgb A1c MFr Bld  Date Value Ref Range Status  08/21/2017 6.6 (H) 4.8 - 5.6 % Final    Comment:    (NOTE)         Prediabetes: 5.7 - 6.4         Diabetes: >6.4         Glycemic control for adults with diabetes: <7.0     Vital Signs: Admission weight: none Temp:  [97.3 F (36.3 C)-98.2 F (36.8 C)] 97.7 F (36.5 C) (12/18 0304) Pulse Rate:  [76-89] 77 (12/18 0304) Cardiac Rhythm: Normal sinus rhythm (12/17 1900) Resp:  [16-18] 18 (12/18 0304) BP: (90-110)/(50-67) 110/50 (12/18 0304) SpO2:  [92 %-97 %] 97 % (12/18 0304) Weight:  [72.2 kg (159 lb 2.8 oz)] 72.2 kg (159 lb 2.8 oz) (12/18 0347)  Intake/Output Summary (Last 24 hours) at 08/22/2023 0749 Last data filed at 08/21/2023 1932 Gross per 24 hour  Intake 360 ml  Output --  Net 360 ml    Current Heart Failure Medications:  Loop diuretic: furosemide 40 mg IV q12h Beta-Blocker: carvedilol 6.25 mg bid ACEI/ARB/ARNI: Entresto 49-51 mg bid MRA: none SGLT2i: Farxiga 10 mg daily Other:    Prior to admission Heart Failure Medications:  Loop diuretic: furosemide 20 mg daily Beta-Blocker: carvedilol 6.25 mg bid ACEI/ARB/ARNI: Entresto 49-51 mg bid MRA: none SGLT2i: [place]  10 mg daily Other: isosorbide ER 30 mg daily  Assessment: 1. Acute on chronic combined systolic and diastolic heart failure (LVEF 20-25%) with grade I diastolic dysfunction and mildly reduced RV failure , due to mixed ICM and NICM. NYHA class III-IV symptoms.  -Symptoms: Reports feeling a little better from yesterday. Appetite is still poor, but shortness of breath is improved. Did become somewhat short of breath moving from the best to the chair. Still has some orthopnea.  -Volume: Volume is improved. Doubt weight accuracy. BUN and creatinine increased slightly.  -Hemodynamics: BP is improved from yesterday.  Pulse is 70-80s.  -BB: Home carvedilol resumed. -ACEI/ARB/ARNI: Home Entresto 49-51 mg BID restarted. BP stable today.  -MRA: Can consider adding spironolactone when BP trends back up. -SGLT2i: Continue Farxiga 10 mg daily - Cardiology considering CMRI to evaluate for infiltrative disease. Would also be useful to help rule out hydroxychloroquine cardiomyopathy.  Plan: 1) Medication changes recommended at this time: -None  2) Patient assistance: -Pending  3) Education: - Patient has been educated on current HF medications and potential additions to HF medication regimen - Patient verbalizes understanding that over the next few months, these medication doses may change and more medications may be added to optimize HF regimen - Patient has been educated on basic disease state pathophysiology and goals of therapy  Medication Assistance / Insurance Benefits Check: Does the patient have prescription insurance?    Type of insurance plan:  Does the patient qualify for medication assistance through manufacturers or grants? Pending   Outpatient Pharmacy: [facility]     Please do not hesitate to reach out with questions or concerns,  Enos Fling, PharmD, CPP, BCPS Heart Failure Pharmacist  Phone - [phone] 08/22/2023 7:49 AM

## 2023-08-22 NOTE — Care Management Important Message (Signed)
Important Message  Patient Details  Name: Tina Fields MRN: [id number] Date of Birth: 1946/04/17   Important Message Given:        Tina Fields 08/22/2023, 3:03 PM

## 2023-08-22 NOTE — TOC Transition Note (Addendum)
Transition of Care [facility]) - Discharge Note   Patient Details  Name: Tina Fields MRN: [id number] Date of Birth: [birth year]  Transition of Care [facility]) CM/SW Contact:  Margarito Liner, LCSW Phone Number: 08/22/2023, 12:50 PM   Clinical Narrative:  Patient has orders to discharge home today. [employer] is aware and will call her to schedule POC eval. No further concerns. CSW signing off.   2:14 pm: Per PT student, recommending home health PT. Patient is agreeable. She is texting her friend to see which agency she works for.  4:35 pm: Patient has been accepted by [facility] for PT. Patient is aware. No further concerns. CSW signing off.  Final next level of care: Home/Self Care Barriers to Discharge: Barriers Resolved   Patient Goals and CMS Choice            Discharge Placement                Patient to be transferred to facility by: Cousin   Patient and family notified of of transfer: 08/22/23  Discharge Plan and Services Additional resources added to the After Visit Summary for       Post Acute Care Choice: NA                               Social Drivers of Health (SDOH) Interventions SDOH Screenings   Food Insecurity: No Food Insecurity (08/20/2023)  Housing: Low Risk  (08/20/2023)  Transportation Needs: No Transportation Needs (08/20/2023)  Utilities: Not At Risk (08/20/2023)  Financial Resource Strain: Medium Risk (10/29/2020)   Received from [facility], [facility]  Physical Activity: Unknown (08/21/2017)  Social Connections: Unknown (08/21/2017)  Stress: Unknown (08/21/2017)  Tobacco Use: Medium Risk (08/18/2023)     Readmission Risk Interventions    08/21/2023   11:14 AM 11/28/2022    1:11 PM 12/04/2021    2:04 PM  Readmission Risk Prevention Plan  Transportation Screening Complete Complete Complete  PCP or Specialist Appt within 5-7 Days   Complete  PCP or Specialist Appt within 3-5 Days Complete Complete   Home Care  Screening   Complete  Medication Review (RN CM)   Referral to Pharmacy  Social Work Consult for Recovery Care Planning/Counseling Complete Complete   Palliative Care Screening Not Applicable Not Applicable   Medication Review Oceanographer) Complete Complete

## 2023-08-22 NOTE — Progress Notes (Signed)
[facility] CLINIC CARDIOLOGY PROGRESS NOTE   Patient ID: Tina Fields MRN: [id number] DOB/AGE: 79-20-1947 79 y.o.  Admit date: 08/18/2023 Referring Physician Dr. Valente David Primary Physician Hessie Diener, Earl Lagos, MD  Primary Cardiologist Dr. Darrold Junker Reason for Consultation AoCHF  HPI: Tina Fields is a 77 y.o. female with a past medical history of chronic diastolic congestive heart failure, insignificant coronary artery disease by prior cardiac catheterization 06/11/2005 and 05/20/2008, essential hypertension and obstructive sleep apnea who presented to the ED on 08/18/2023 for SOB and LE edema. Cardiology was consulted for further evaluation.  Interval History:  -Patient seen and examined this morning, reports she is feeling well.  Appears more energetic than prior days. -States breathing is improved, remains on baseline O2.  -Continues to have good UOP and renal function is stable.  Review of systems complete and found to be negative unless listed above    Vitals:   08/22/23 0304 08/22/23 0347 08/22/23 0812 08/22/23 1206  BP: (!) 110/50  101/70 (!) 81/57  Pulse: 77  84 81  Resp: 18     Temp: 97.7 F (36.5 C)  97.8 F (36.6 C) 97.9 F (36.6 C)  TempSrc:      SpO2: 97%  99% 93%  Weight:  72.2 kg    Height:         Intake/Output Summary (Last 24 hours) at 08/22/2023 1244 Last data filed at 08/21/2023 1932 Gross per 24 hour  Intake 240 ml  Output --  Net 240 ml     PHYSICAL EXAM General: Chronically ill appearing female, well nourished, in no acute distress resting comfortably in hospital bed. HEENT: Normocephalic and atraumatic. Neck: No JVD.  Lungs: Normal respiratory effort on 2L [place]. Clear bilaterally to auscultation. No wheezes, crackles, rhonchi.  Heart: HRRR. Normal S1 and S2 without gallops or murmurs. Radial & DP pulses 2+ bilaterally. Abdomen: Non-distended appearing.  Msk: Normal strength and tone for age. Extremities: No clubbing, cyanosis.   No edema. Neuro: Alert and oriented X 3. Psych: Mood appropriate, affect congruent.    LABS: Basic Metabolic Panel: Recent Labs    08/21/23 0420 08/22/23 0422  NA 138 137  K 4.2 4.5  CL 100 98  CO2 28 28  GLUCOSE 110* 119*  BUN 31* 39*  CREATININE 1.03* 1.11*  CALCIUM 8.3* 8.7*   Liver Function Tests: No results for input(s): "AST", "ALT", "ALKPHOS", "BILITOT", "PROT", "ALBUMIN" in the last 72 hours. No results for input(s): "LIPASE", "AMYLASE" in the last 72 hours. CBC: No results for input(s): "WBC", "NEUTROABS", "HGB", "HCT", "MCV", "PLT" in the last 72 hours.  Cardiac Enzymes: No results for input(s): "CKTOTAL", "CKMB", "CKMBINDEX", "TROPONINIHS" in the last 72 hours.  BNP: No results for input(s): "BNP" in the last 72 hours.  D-Dimer: No results for input(s): "DDIMER" in the last 72 hours. Hemoglobin A1C: No results for input(s): "HGBA1C" in the last 72 hours. Fasting Lipid Panel: No results for input(s): "CHOL", "HDL", "LDLCALC", "TRIG", "CHOLHDL", "LDLDIRECT" in the last 72 hours. Thyroid Function Tests: No results for input(s): "TSH", "T4TOTAL", "T3FREE", "THYROIDAB" in the last 72 hours.  Invalid input(s): "FREET3" Anemia Panel: No results for input(s): "VITAMINB12", "FOLATE", "FERRITIN", "TIBC", "IRON", "RETICCTPCT" in the last 72 hours.  No results found.  ECHO 11/2022: 1. Consider infiltrative process ie.Marland Kitchen amyloid /sarcoid.   2. Ischemic cardiomyopathy can't be excluded.   3. Severely depressed LVF worse since previous study 5/23.   4. Left ventricular ejection fraction, by estimation, is 20 to 25%. The  left ventricle has severely decreased function. The left ventricle demonstrates global hypokinesis. The left ventricular internal cavity size was mildly to moderately dilated. Left ventricular diastolic parameters are consistent with Grade I diastolic dysfunction (impaired relaxation).   5. Right ventricular systolic function is mildly reduced. The right  ventricular size is mildly enlarged. Mildly increased right ventricular wall thickness.   6. Left atrial size was mildly dilated.   7. Right atrial size was mildly dilated.   8. The mitral valve is degenerative. Mild mitral valve regurgitation.   9. The aortic valve is calcified. Aortic valve regurgitation is trivial. Aortic valve sclerosis/calcification is present, without any evidence of aortic stenosis.   TELEMETRY reviewed by me 08/22/23: NSR rate 60s  EKG reviewed by me 08/22/23: Normal sinus rhythm nonspecific ST-T changes rate of 70   DATA reviewed by me 08/22/23: last 24h vitals tele labs imaging I/O, hospitalist progress note  Principal Problem:   Acute on chronic combined CHF Active Problems:   Rheumatoid arthritis, unspecified (HCC)   OSA (obstructive sleep apnea)   History of pulmonary embolism   Hypokalemia   Dyslipidemia   Coronary artery disease   Acute on chronic respiratory failure with hypoxia (HCC)   Asthma, chronic    ASSESSMENT AND PLAN: [name] is a 77 y.o. female with a past medical history of chronic diastolic congestive heart failure, insignificant coronary artery disease by prior cardiac catheterization 06/11/2005 and 05/20/2008, essential hypertension and obstructive sleep apnea who presented to the ED on 08/18/2023 for SOB and LE edema. Cardiology was consulted for further evaluation.  # Acute on chronic HFrEF # Acute on chronic hypoxic respiratory failure Patient presented to the ED with worsening shortness of breath.  BNP 2600.  Chest x-ray with pulmonary vascular congestion as well as small right and trace left pleural effusions.  Started on IV diuresis in the ED. -Continue Lasix 40 mg daily. -Continue GDMT with carvedilol 6.25 mg twice daily, Entresto 49-51 mg twice daily, Farxiga 10 mg daily.    # Coronary artery disease # Hyperlipidemia History of insignificant coronary disease by prior cath.  -Continue atorvastatin 40 mg daily.  #  Obstructive sleep apnea With history of OSA, reports compliance with CPAP -Continue CPAP nightly.  Ok for discharge today from a cardiac perspective. Will arrange for follow up in clinic with Dr. Darrold Junker in 1-2 weeks.    This patient's case was discussed and created with Dr. Corky Sing and he is in agreement.  Signed:  Gale Journey, PA-C  08/22/2023, 12:44 PM [facility] Cardiology

## 2023-08-22 NOTE — Plan of Care (Signed)
  Problem: Education: Goal: Knowledge of General Education information will improve Description: Including pain rating scale, medication(s)/side effects and non-pharmacologic comfort measures Outcome: Progressing   Problem: Clinical Measurements: Goal: Respiratory complications will improve Outcome: Progressing   Problem: Elimination: Goal: Will not experience complications related to bowel motility Outcome: Progressing   Problem: Education: Goal: Ability to verbalize understanding of medication therapies will improve Outcome: Progressing   Problem: Cardiac: Goal: Ability to achieve and maintain adequate cardiopulmonary perfusion will improve Outcome: Progressing

## 2023-08-22 NOTE — Discharge Summary (Signed)
Physician Discharge Summary  Tina Fields NUU:[id number] DOB: Aug 09, 1946 DOA: 08/18/2023  PCP: Tina Conroy, MD  Admit date: 08/18/2023 Discharge date: 08/22/2023  Admitted From: Home Disposition: Home  Recommendations for Outpatient Follow-up:  Follow up with PCP and cardiology  Discharge Condition: Stable CODE STATUS: DNR Diet recommendation: Heart healthy diet  Brief/Interim Summary: Tina Fields is a 77 y.o. female with medical history significant for combined systolic and diastolic CHF, chronic respiratory failure on home O2 at 2 L by nasal cannula, coronary artery disease, PE on Eliquis, asthma, OSA, essential hypertension and GERD, who presented to the emergency room with acute onset of worsening dyspnea for the past few days which has been significantly worse today.  She has been having worsening lower extremity edema  yesterday but has improved today.  She also admits to orthopnea and paroxysmal nocturnal dyspnea as well as dyspnea on exertion.  She has been having mild dry cough and occasional wheezing.  No fever or chills.  No chest pain or palpitations.  She has been taking her Lasix regularly but has not been urinating as usual.  Patient was admitted to the hospital and cardiology consulted for CHF exacerbation.  She was diuresed well with IV Lasix.  Discharge Diagnoses:   Principal Problem:   Acute on chronic combined CHF Active Problems:   Acute on chronic respiratory failure with hypoxia (HCC)   Hypokalemia   Coronary artery disease   Dyslipidemia   Rheumatoid arthritis, unspecified (HCC)   History of pulmonary embolism   OSA (obstructive sleep apnea)   Asthma, chronic   Acute on chronic combined systolic and diastolic CHF -BNP 2610.1 -Cardiology following -IV Lasix --> PO  -Entresto, Farxiga, Coreg -Strict I's and O's, daily weight, fluid restriction diet   Acute on chronic hypoxemic respiratory failure -Uses 2 L nasal cannula at  baseline   CAD, dyslipidemia -Lipitor   Rheumatoid arthritis -Methotrexate weekly, prednisone   History of PE -Eliquis   OSA -CPAP nightly  Discharge Instructions  Discharge Instructions     (HEART FAILURE PATIENTS) Call MD:  Anytime you have any of the following symptoms: 1) 3 pound weight gain in 24 hours or 5 pounds in 1 week 2) shortness of breath, with or without a dry hacking cough 3) swelling in the hands, feet or stomach 4) if you have to sleep on extra pillows at night in order to breathe.   Complete by: As directed    Call MD for:  difficulty breathing, headache or visual disturbances   Complete by: As directed    Call MD for:  extreme fatigue   Complete by: As directed    Call MD for:  persistant dizziness or light-headedness   Complete by: As directed    Call MD for:  persistant nausea and vomiting   Complete by: As directed    Call MD for:  severe uncontrolled pain   Complete by: As directed    Call MD for:  temperature >100.4   Complete by: As directed    Diet - low sodium heart healthy   Complete by: As directed    Discharge instructions   Complete by: As directed    You were cared for by a hospitalist during your hospital stay. If you have any questions about your discharge medications or the care you received while you were in the hospital after you are discharged, you can call the unit and ask to speak with the hospitalist on call if the hospitalist that  took care of you is not available. Once you are discharged, your primary care physician will handle any further medical issues. Please note that NO REFILLS for any discharge medications will be authorized once you are discharged, as it is imperative that you return to your primary care physician (or establish a relationship with a primary care physician if you do not have one) for your aftercare needs so that they can reassess your need for medications and monitor your lab values.   Increase activity slowly    Complete by: As directed       Allergies as of 08/22/2023       Reactions   Ace Inhibitors Cough   Other reaction(s): Cough   Hydrochlorothiazide Other (See Comments)   Cramps   Latex Hives   Azithromycin Rash   Jardiance [empagliflozin] Itching        Medication List     TAKE these medications    acetaminophen 650 MG CR tablet Commonly known as: TYLENOL Take 1,300 mg by mouth as needed for pain.   albuterol 108 (90 Base) MCG/ACT inhaler Commonly known as: VENTOLIN HFA Inhale 1-2 puffs into the lungs every 6 (six) hours as needed for wheezing or shortness of breath.   apixaban 2.5 MG Tabs tablet Commonly known as: Eliquis Take 1 tablet (2.5 mg total) by mouth 2 (two) times daily.   atorvastatin 40 MG tablet Commonly known as: LIPITOR Take 1 tablet (40 mg total) by mouth every morning.   carvedilol 6.25 MG tablet Commonly known as: COREG Take 1 tablet (6.25 mg total) by mouth 2 (two) times daily.   [employer] Vitamin B12 1000 MCG Tbcr Generic drug: Cyanocobalamin Take 1,000 mcg by mouth once a week.   dapagliflozin propanediol 10 MG Tabs tablet Commonly known as: Farxiga Take 1 tablet (10 mg total) by mouth daily before breakfast.   Entresto 49-51 MG Generic drug: sacubitril-valsartan Take 1 tablet by mouth 2 (two) times daily.   fluticasone 50 MCG/ACT nasal spray Commonly known as: FLONASE Place 2 sprays into the nose daily as needed for allergies.   folic acid 1 MG tablet Commonly known as: FOLVITE Take 1 mg by mouth daily.   furosemide 20 MG tablet Commonly known as: LASIX Take 1 tablet (20 mg total) by mouth daily.   hydroxychloroquine 200 MG tablet Commonly known as: PLAQUENIL Take 200 mg by mouth daily.   isosorbide mononitrate 30 MG 24 hr tablet Commonly known as: IMDUR Take 30 mg by mouth daily.   methotrexate 2.5 MG tablet Commonly known as: RHEUMATREX Take 15 mg by mouth every Monday.   montelukast 10 MG tablet Commonly known as:  SINGULAIR Take 10 mg by mouth at bedtime.   nitroGLYCERIN 0.4 MG SL tablet Commonly known as: NITROSTAT Place 1 tablet (0.4 mg total) under the tongue every 5 (five) minutes as needed for chest pain.   potassium chloride SA 20 MEQ tablet Commonly known as: KLOR-CON M Take 1 tablet (20 mEq total) by mouth 2 (two) times daily.   predniSONE 5 MG tablet Commonly known as: DELTASONE Take 5 mg by mouth daily with breakfast.               Durable Medical Equipment  (From admission, onward)           Start     Ordered   08/21/23 1115  For home use only DME Other see comment  Once       Comments: evaluate for POC 1-5 L pulse  dose and if qualifies, please dispense.  Question:  Length of Need  Answer:  Lifetime   08/21/23 1114            Follow-up Information     [facility] Follow up in 7 day(s).   Specialty: Cardiology Why: Hospital Follow-Up 08/27/23 @ 11:30  with Inetta Fermo Please bring all medications with you to appointment [facility], [address] Second Floor Free Valet Parking @ the door Contact information: [address] [place] [address] [phone]        Germaine Pomfret, Eliane Decree, NP. Go in 1 week(s).   Specialty: Nurse Practitioner Contact information: [address] [place] [address] [phone]         Tina Conroy, MD Follow up.   Specialty: Family Medicine Contact information: [address] [place] [address] [phone]                Allergies  Allergen Reactions   Ace Inhibitors Cough    Other reaction(s): Cough    Hydrochlorothiazide Other (See Comments)    Cramps   Latex Hives   Azithromycin Rash   Jardiance [Empagliflozin] Itching    Consultations: Cardiology    Procedures/Studies: DG Chest 2 View Result Date: 08/18/2023 CLINICAL DATA:  COPD EXAM: CHEST - 2 VIEW COMPARISON:  06/05/2023 FINDINGS:  Electronic device overlies the anterior left chest wall. Cardiomegaly. Aortic atherosclerosis. Pulmonary vascular congestion. Diffuse bilateral interstitial prominence. Small right and trace left pleural effusions. No pneumothorax. IMPRESSION: 1. Cardiomegaly with pulmonary vascular congestion and diffuse bilateral interstitial prominence, suggestive of pulmonary edema. 2. Small right and trace left pleural effusions. Electronically Signed   By: Duanne Guess D.O.   On: 08/18/2023 17:24      Discharge Exam: Vitals:   08/22/23 0812 08/22/23 1206  BP: 101/70 (!) 81/57  Pulse: 84 81  Resp:    Temp: 97.8 F (36.6 C) 97.9 F (36.6 C)  SpO2: 99% 93%    General: Pt is alert, awake, not in acute distress Cardiovascular: RRR, S1/S2 +, no edema Respiratory: CTA bilaterally, no wheezing, no rhonchi, no respiratory distress, no conversational dyspnea  Abdominal: Soft, NT, ND, bowel sounds + Extremities: no edema, no cyanosis Psych: Normal mood and affect, stable judgement and insight     The results of significant diagnostics from this hospitalization (including imaging, microbiology, ancillary and laboratory) are listed below for reference.     Microbiology: No results found for this or any previous visit (from the past 240 hours).   Labs: BNP (last 3 results) Recent Labs    11/09/22 2352 11/26/22 0943 08/18/23 1803  BNP 926.9* 2,966.8* 2,610.1*   Basic Metabolic Panel: Recent Labs  Lab 08/18/23 1803 08/19/23 0555 08/20/23 0526 08/21/23 0420 08/22/23 0422  NA 142 141 140 138 137  K 4.6 4.8 4.2 4.2 4.5  CL 113* 107 100 100 98  CO2 24 25 32 28 28  GLUCOSE 91 98 111* 110* 119*  BUN 18 18 21  31* 39*  CREATININE 0.70 0.87 0.88 1.03* 1.11*  CALCIUM 7.5* 8.3* 8.6* 8.3* 8.7*  MG 1.8  --   --   --   --    Liver Function Tests: No results for input(s): "AST", "ALT", "ALKPHOS", "BILITOT", "PROT", "ALBUMIN" in the last 168 hours. No results for input(s): "LIPASE", "AMYLASE" in  the last 168 hours. No results for input(s): "AMMONIA" in the last 168 hours. CBC: Recent Labs  Lab 08/18/23 1542 08/19/23 0555  WBC 4.3 4.1  HGB 12.3 12.5  HCT 39.6 40.7  MCV 106.5* 106.3*  PLT 231 197   Cardiac Enzymes: No results for input(s): "CKTOTAL", "CKMB", "CKMBINDEX", "TROPONINI" in the last 168 hours. BNP: Invalid input(s): "POCBNP" CBG: No results for input(s): "GLUCAP" in the last 168 hours. D-Dimer No results for input(s): "DDIMER" in the last 72 hours. Hgb A1c No results for input(s): "HGBA1C" in the last 72 hours. Lipid Profile No results for input(s): "CHOL", "HDL", "LDLCALC", "TRIG", "CHOLHDL", "LDLDIRECT" in the last 72 hours. Thyroid function studies No results for input(s): "TSH", "T4TOTAL", "T3FREE", "THYROIDAB" in the last 72 hours.  Invalid input(s): "FREET3" Anemia work up No results for input(s): "VITAMINB12", "FOLATE", "FERRITIN", "TIBC", "IRON", "RETICCTPCT" in the last 72 hours. Urinalysis    Component Value Date/Time   COLORURINE YELLOW (A) 06/06/2023 0050   APPEARANCEUR CLEAR (A) 06/06/2023 0050   APPEARANCEUR Clear 01/16/2012 0248   LABSPEC 1.015 06/06/2023 0050   LABSPEC 1.009 01/16/2012 0248   PHURINE 6.0 06/06/2023 0050   GLUCOSEU >=500 (A) 06/06/2023 0050   GLUCOSEU Negative 01/16/2012 0248   HGBUR MODERATE (A) 06/06/2023 0050   BILIRUBINUR NEGATIVE 06/06/2023 0050   BILIRUBINUR Negative 01/16/2012 0248   KETONESUR NEGATIVE 06/06/2023 0050   PROTEINUR NEGATIVE 06/06/2023 0050   NITRITE NEGATIVE 06/06/2023 0050   LEUKOCYTESUR MODERATE (A) 06/06/2023 0050   LEUKOCYTESUR Negative 01/16/2012 0248   Sepsis Labs Recent Labs  Lab 08/18/23 1542 08/19/23 0555  WBC 4.3 4.1   Microbiology No results found for this or any previous visit (from the past 240 hours).   Patient was seen and examined on the day of discharge and was found to be in stable condition. Time coordinating discharge: 25 minutes including assessment and  coordination of care, as well as examination of the patient.   SIGNED:  Noralee Stain, DO [facility] 08/22/2023, 12:34 PM

## 2023-08-22 NOTE — TOC Progression Note (Signed)
Transition of Care [facility]) - Progression Note    Patient Details  Name: Tina Fields MRN: [id number] Date of Birth: 07-14-1946  Transition of [facility]) CM/SW Contact  Margarito Liner, LCSW Phone Number: 08/22/2023, 11:14 AM  Clinical Narrative:  CSW notified patient of plan for outpatient POC eval.   Expected Discharge Plan: Home/Self Care Barriers to Discharge: Continued Medical Work up  Expected Discharge Plan and Services     Post Acute Care Choice: NA Living arrangements for the past 2 months: Single Family Home                                       Social Determinants of Health (SDOH) Interventions SDOH Screenings   Food Insecurity: No Food Insecurity (08/20/2023)  Housing: Low Risk  (08/20/2023)  Transportation Needs: No Transportation Needs (08/20/2023)  Utilities: Not At Risk (08/20/2023)  Financial Resource Strain: Medium Risk (10/29/2020)   Received from [facility]  Physical Activity: Unknown (08/21/2017)  Social Connections: Unknown (08/21/2017)  Stress: Unknown (08/21/2017)  Tobacco Use: Medium Risk (08/18/2023)    Readmission Risk Interventions    08/21/2023   11:14 AM 11/28/2022    1:11 PM 12/04/2021    2:04 PM  Readmission Risk Prevention Plan  Transportation Screening Complete Complete Complete  PCP or Specialist Appt within 5-7 Days   Complete  PCP or Specialist Appt within 3-5 Days Complete Complete   Home Care Screening   Complete  Medication Review (RN CM)   Referral to Pharmacy  Social Work Consult for Recovery Care Planning/Counseling Complete Complete   Palliative Care Screening Not Applicable Not Applicable   Medication Review Oceanographer) Complete Complete

## 2023-08-22 NOTE — Evaluation (Signed)
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 She has been experiencing worsening of dyspnea, LE edema, mild dry cough and occasional wheezing. PMHx includes: Hx of PE, CAD, dyslipidemia, asthma, and acute on chronic respiratory failure with hypoxia.   Clinical Impression  Pt received in bed on 2L of O2 and agreed to PT session. Pt performed bed mobility ModI, STS with the use of RW (2wheels) CGA, and amb ~61ft CGA prior to ending session in recliner due to fatigue with family at bedside. VC necessary throughout session for RW management and instructed pursed lip breathing. Pt vitals throughout session read 94-98% SpO2 on 2L of O2 [place]. Pt tolerated Tx well and will continue to benefit from skilled PT sessions to improve functional mobility and activity tolerance to maximize safety/IND following D/C.         If plan is discharge home, recommend the following: A little help with walking and/or transfers;Assist for transportation;Help with stairs or ramp for entrance   Can travel by private vehicle        Equipment Recommendations None recommended by PT  Recommendations for Other Services       Functional Status Assessment Patient has had a recent decline in their functional status and demonstrates the ability to make significant improvements in function in a reasonable and predictable amount of time.     Precautions / Restrictions Precautions Precautions: Fall Restrictions Weight Bearing Restrictions Per Provider Order: No      Mobility  Bed Mobility Overal bed mobility: Modified Independent             General bed mobility comments: Pt performed bed mobility ModI and did not report any s/sx relative to dizziness while seated EOB.    Transfers Overall transfer level: Needs assistance Equipment  used: Rolling walker (2 wheels) Transfers: Sit to/from Stand Sit to Stand: Contact guard assist           General transfer comment: Pt performed STS with the use of RW (2wheels) CGA and reported minimal dizziness while standing    Ambulation/Gait Ambulation/Gait assistance: Contact guard assist Gait Distance (Feet): 60 Feet Assistive device: Rolling walker (2 wheels) Gait Pattern/deviations: Step-through pattern Gait velocity: decreased     General Gait Details: Pt amb in hallway with the use of RW (2wheels) CGA on 2L of O2 which is pt's baseline. Pt was able to walk 5ft prior to fatigue set in which required pt to decline further mobility  Stairs            Wheelchair Mobility     Tilt Bed    Modified Rankin (Stroke Patients Only)       Balance Overall balance assessment: Needs assistance Sitting-balance support: Feet supported Sitting balance-Leahy Scale: Good     Standing balance support: Bilateral upper extremity supported, During functional activity Standing balance-Leahy Scale: Fair                               Pertinent Vitals/Pain Pain Assessment Pain Assessment: No/denies pain    Home Living Family/patient expects to be discharged to:: Private residence Living Arrangements: Alone Available Help at Discharge: Family;Available PRN/intermittently Type of Home: House Home Access: Stairs to enter Entrance Stairs-Rails: None Entrance Stairs-Number of Steps: 6   Home Layout: One level Home Equipment: Grab  bars - tub/shower Additional Comments: Pt is currently in the process of moving. Following D/C she will be staying with her cousin and her cousin's wife until she then moves into her own apartment. Home living information above is relative to cousin's home. Pt reports the new apartment that she will be moving into is on the 1st floor/no stairs to enter.    Prior Function Prior Level of Function : Independent/Modified Independent              Mobility Comments: Pt reports IND prior to admission. Pt reports the use of RW occassionally for long distance amb ADLs Comments: Pt reports IND prior to admission     Extremity/Trunk Assessment   Upper Extremity Assessment Upper Extremity Assessment: Overall WFL for tasks assessed    Lower Extremity Assessment Lower Extremity Assessment: Generalized weakness       Communication   Communication Communication: No apparent difficulties Cueing Techniques: Verbal cues  Cognition Arousal: Alert Behavior During Therapy: WFL for tasks assessed/performed Overall Cognitive Status: Within Functional Limits for tasks assessed                                 General Comments: AOx4. Pt pleasant and willing to participate in PT session.        General Comments      Exercises     Assessment/Plan    PT Assessment Patient needs continued PT services  PT Problem List Decreased strength;Decreased activity tolerance;Decreased balance;Decreased mobility       PT Treatment Interventions DME instruction;Gait training;Therapeutic activities;Therapeutic exercise;Functional mobility training;Balance training    PT Goals (Current goals can be found in the Care Plan section)  Acute Rehab PT Goals Patient Stated Goal: To go home PT Goal Formulation: With patient Time For Goal Achievement: 09/05/23 Potential to Achieve Goals: Good    Frequency Min 1X/week     Co-evaluation               AM-PAC PT "6 Clicks" Mobility  Outcome Measure Help needed turning from your back to your side while in a flat bed without using bedrails?: None Help needed moving from lying on your back to sitting on the side of a flat bed without using bedrails?: None Help needed moving to and from a bed to a chair (including a wheelchair)?: A Little Help needed standing up from a chair using your arms (e.g., wheelchair or bedside chair)?: A Little Help needed to walk in hospital  room?: A Little Help needed climbing 3-5 steps with a railing? : A Lot 6 Click Score: 19    End of Session Equipment Utilized During Treatment: Gait belt Activity Tolerance: Patient limited by fatigue Patient left: in chair;with call bell/phone within reach;with chair alarm set;with family/visitor present Nurse Communication: Mobility status PT Visit Diagnosis: Other abnormalities of gait and mobility (R26.89);Muscle weakness (generalized) (M62.81);Difficulty in walking, not elsewhere classified (R26.2)    Time: 1340-1400 PT Time Calculation (min) (ACUTE ONLY): 20 min   Charges:   PT Evaluation $PT Eval Low Complexity: 1 Low PT Treatments $Gait Training: 8-22 mins PT General Charges $$ ACUTE PT VISIT: 1 Visit         Katiejo Gilroy Sauvignon Howard SPT, LAT, ATC  Gissel Keilman Sauvignon-Howard 08/22/2023, 4:26 PM

## 2023-08-27 ENCOUNTER — Ambulatory Visit: Payer: 59 | Attending: Family | Admitting: Family

## 2023-08-27 ENCOUNTER — Encounter: Payer: Self-pay | Admitting: Family

## 2023-08-27 VITALS — BP 94/49 | HR 72 | Wt 171.0 lb

## 2023-08-27 DIAGNOSIS — J45909 Unspecified asthma, uncomplicated: Secondary | ICD-10-CM | POA: Insufficient documentation

## 2023-08-27 DIAGNOSIS — I2510 Atherosclerotic heart disease of native coronary artery without angina pectoris: Secondary | ICD-10-CM | POA: Insufficient documentation

## 2023-08-27 DIAGNOSIS — I5022 Chronic systolic (congestive) heart failure: Secondary | ICD-10-CM | POA: Diagnosis present

## 2023-08-27 DIAGNOSIS — Z9981 Dependence on supplemental oxygen: Secondary | ICD-10-CM | POA: Insufficient documentation

## 2023-08-27 DIAGNOSIS — I428 Other cardiomyopathies: Secondary | ICD-10-CM | POA: Diagnosis not present

## 2023-08-27 DIAGNOSIS — Z7952 Long term (current) use of systemic steroids: Secondary | ICD-10-CM | POA: Insufficient documentation

## 2023-08-27 DIAGNOSIS — Z86711 Personal history of pulmonary embolism: Secondary | ICD-10-CM | POA: Insufficient documentation

## 2023-08-27 DIAGNOSIS — Z8616 Personal history of COVID-19: Secondary | ICD-10-CM | POA: Diagnosis not present

## 2023-08-27 DIAGNOSIS — I10 Essential (primary) hypertension: Secondary | ICD-10-CM | POA: Diagnosis not present

## 2023-08-27 DIAGNOSIS — M069 Rheumatoid arthritis, unspecified: Secondary | ICD-10-CM | POA: Insufficient documentation

## 2023-08-27 DIAGNOSIS — E785 Hyperlipidemia, unspecified: Secondary | ICD-10-CM | POA: Insufficient documentation

## 2023-08-27 DIAGNOSIS — I110 Hypertensive heart disease with heart failure: Secondary | ICD-10-CM | POA: Diagnosis not present

## 2023-08-27 DIAGNOSIS — Z79899 Other long term (current) drug therapy: Secondary | ICD-10-CM | POA: Insufficient documentation

## 2023-08-27 DIAGNOSIS — Z7901 Long term (current) use of anticoagulants: Secondary | ICD-10-CM | POA: Insufficient documentation

## 2023-08-27 DIAGNOSIS — G4733 Obstructive sleep apnea (adult) (pediatric): Secondary | ICD-10-CM | POA: Insufficient documentation

## 2023-08-27 DIAGNOSIS — Z5986 Financial insecurity: Secondary | ICD-10-CM | POA: Diagnosis not present

## 2023-08-27 DIAGNOSIS — I2782 Chronic pulmonary embolism: Secondary | ICD-10-CM

## 2023-08-27 DIAGNOSIS — Z8249 Family history of ischemic heart disease and other diseases of the circulatory system: Secondary | ICD-10-CM | POA: Diagnosis not present

## 2023-08-27 NOTE — Patient Instructions (Signed)
 Go over to the [facility]. Go pass the gift shop and have your blood work completed.  We will only call you if the results are abnormal or if the provider would like to make medication changes.

## 2023-08-27 NOTE — Progress Notes (Signed)
ADVANCED HEART FAILURE FOLLOW UP CLINIC NOTE   PCP: Tonia Ghent, MD  Primary Cardiologist: Marcina Millard, MD/ Sandrea Hughs, NP (last seen 12/24)  Chief Complaint: Fatigue  HPI:  Tina Fields is a 77 y/o female with a history of asthma, insignifcant CAD, HTN, aortic atherosclerosis, asthma, GERD, obstructive sleep apnea, RA, PE (05/23), hyperlipidemia, previous tobacco use and chronic heart failure.   Admitted 11/10/22 due to chest pain and SOB due to a/c heart failure. IV diuresed. Myoview stress test done 3/8 Findings: Abnormal myocardial perfusion scan, Severely depressed left ventricular function less than 25%. Midodrine started. Admitted 11/26/22 due to SOB due to a/c heart failure. Was in the ED 06/05/23 with generalized weakness, fatigue, sinus congestion, cough, and urinary frequency and foul-smelling urine for the last 2 to 3 days along withbilateral submandibular tenderness. Found to have UTI and antibiotics started.     Admitted 08/18/23 due to worsening shortness of breath for several day. Improving edema. + orthopnea and PND. IV diuresed. BNP elevated to 2610.1. HS-troponin normal. CXR noted pulmonary vascular congestion and bilateral interstitial prominence suggestive of pulmonary edema small effusion. Cardiology consulted.   Echo 05/03/21: EF of 45-50% with moderate LAE.  Echo 01/29/22: EF of 45-50% along with mild LVH, moderately elevated PA pressure and mild MR. Echo 11/11/22: EF 20-25% with mild LAE, mild MR, possible infiltrative process.    Lexiscan Myoview 11/10/2022 revealed anterolateral and inferolateral scar without ischemia in the absence of chest pain. Cardiac catheterization was deferred.   She presents today for a HF follow-up visit with a chief complaint of moderate fatigue with minimal exertion. Chronic in nature. Has associated shortness of breath and pedal edema along with this. She says that both her SOB and edema have improved since her recent  admission. She denies chest pain, palpitations, abdominal distention, dizziness, difficulty sleeping or weight gain. Reports sleeping well on 1 pillow.   Has oxygen at home at 2L for PRN use. Still needs to get to Dr. Reita Cliche office to discuss getting a smaller portable tank.   ROS: All systems negative except as listed in HPI, PMH and Problem List.  SH:  Social History   Socioeconomic History   Marital status: Widowed    Spouse name: Not on file   Number of children: Not on file   Years of education: Not on file   Highest education level: Not on file  Occupational History   Not on file  Tobacco Use   Smoking status: Former    Current packs/day: 0.00    Types: Cigarettes    Quit date: 01/09/1995    Years since quitting: 28.6   Smokeless tobacco: Never  Vaping Use   Vaping status: Never Used  Substance and Sexual Activity   Alcohol use: No   Drug use: No   Sexual activity: Yes    Birth control/protection: None  Other Topics Concern   Not on file  Social History Narrative   Adult son   Social Drivers of Health   Financial Resource Strain: Medium Risk (10/29/2020)   Received from [facility]   Overall Financial Resource Strain (CARDIA)    Difficulty of Paying Living Expenses: Somewhat hard  Food Insecurity: No Food Insecurity (08/20/2023)   Hunger Vital Sign    Worried About Running Out of Food in the Last Year: Never true    Ran Out of Food in the Last Year: Never true  Transportation Needs: No Transportation Needs (08/20/2023)  PRAPARE - Administrator, Civil Service (Medical): No    Lack of Transportation (Non-Medical): No  Physical Activity: Unknown (08/21/2017)   Exercise Vital Sign    Days of Exercise per Week: Patient declined    Minutes of Exercise per Session: Patient declined  Stress: Unknown (08/21/2017)   [employer] of Occupational Health - Occupational Stress Questionnaire    Feeling of Stress : Patient declined   Social Connections: Unknown (08/21/2017)   Social Connection and Isolation Panel [NHANES]    Frequency of Communication with Friends and Family: Patient declined    Frequency of Social Gatherings with Friends and Family: Patient declined    Attends Religious Services: Patient declined    Database administrator or Organizations: Patient declined    Attends Banker Meetings: Patient declined    Marital Status: Patient declined  Intimate Partner Violence: Not At Risk (08/20/2023)   Humiliation, Afraid, Rape, and Kick questionnaire    Fear of Current or Ex-Partner: No    Emotionally Abused: No    Physically Abused: No    Sexually Abused: No    FH:  Family History  Problem Relation Age of Onset   CAD Mother    Diabetes Mother    Hypertension Mother    CAD Father    Diabetes Father    Hypertension Father    Stroke Father     Past Medical History:  Diagnosis Date   Aortic atherosclerosis (HCC)    Asthma    Chest pain 11/10/2022   CHF (congestive heart failure) (HCC)    Coronary artery disease    Dyspnea    Edema of both legs    GERD (gastroesophageal reflux disease)    HFrEF (heart failure with reduced ejection fraction) (HCC)    History of 2019 novel coronavirus disease (COVID-19) 10/28/2020   Hypertension    Myocardial infarction (HCC)    OSA (obstructive sleep apnea)    noncompliant with nocturnal PAP therapy   RA (rheumatoid arthritis) (HCC)     Current Outpatient Medications  Medication Sig Dispense Refill   acetaminophen (TYLENOL) 650 MG CR tablet Take 1,300 mg by mouth as needed for pain.     albuterol (VENTOLIN HFA) 108 (90 Base) MCG/ACT inhaler Inhale 1-2 puffs into the lungs every 6 (six) hours as needed for wheezing or shortness of breath.     apixaban (ELIQUIS) 2.5 MG TABS tablet Take 1 tablet (2.5 mg total) by mouth 2 (two) times daily. 60 tablet 5   atorvastatin (LIPITOR) 40 MG tablet Take 1 tablet (40 mg total) by mouth every morning. 90  tablet 0   carvedilol (COREG) 6.25 MG tablet Take 1 tablet (6.25 mg total) by mouth 2 (two) times daily. 180 tablet 1   [employer] VITAMIN B12 1000 MCG TBCR Take 1,000 mcg by mouth once a week.     dapagliflozin propanediol (FARXIGA) 10 MG TABS tablet Take 1 tablet (10 mg total) by mouth daily before breakfast. 30 tablet 5   fluticasone (FLONASE) 50 MCG/ACT nasal spray Place 2 sprays into the nose daily as needed for allergies.     folic acid (FOLVITE) 1 MG tablet Take 1 mg by mouth daily.     furosemide (LASIX) 20 MG tablet Take 1 tablet (20 mg total) by mouth daily. 90 tablet 0   hydroxychloroquine (PLAQUENIL) 200 MG tablet Take 200 mg by mouth daily.     isosorbide mononitrate (IMDUR) 30 MG 24 hr tablet Take 30 mg by mouth  daily.     methotrexate (RHEUMATREX) 2.5 MG tablet Take 15 mg by mouth every Monday.     montelukast (SINGULAIR) 10 MG tablet Take 10 mg by mouth at bedtime.     nitroGLYCERIN (NITROSTAT) 0.4 MG SL tablet Place 1 tablet (0.4 mg total) under the tongue every 5 (five) minutes as needed for chest pain. 30 tablet 0   potassium chloride SA (KLOR-CON M) 20 MEQ tablet Take 1 tablet (20 mEq total) by mouth 2 (two) times daily. 14 tablet 0   predniSONE (DELTASONE) 5 MG tablet Take 5 mg by mouth daily with breakfast.     sacubitril-valsartan (ENTRESTO) 49-51 MG Take 1 tablet by mouth 2 (two) times daily. 180 tablet 3   No current facility-administered medications for this visit.   Vitals:   08/27/23 1130  BP: (!) 94/49  Pulse: 72  SpO2: 96%  Weight: 171 lb (77.6 kg)   Wt Readings from Last 3 Encounters:  08/27/23 171 lb (77.6 kg)  08/22/23 159 lb 2.8 oz (72.2 kg)  08/16/23 179 lb (81.2 kg)   Lab Results  Component Value Date   CREATININE 1.11 (H) 08/22/2023   CREATININE 1.03 (H) 08/21/2023   CREATININE 0.88 08/20/2023   PHYSICAL EXAM:  General:  Well appearing. No resp difficulty HEENT: normal Neck: supple. JVP flat. No lymphadenopathy or thryomegaly appreciated. Cor:  PMI normal. Regular rate & rhythm. No rubs, gallops or murmurs. Lungs: clear Abdomen: soft, nontender, nondistended. No hepatosplenomegaly. No bruits or masses.  Extremities: no cyanosis, clubbing, fingers deformed due to RA, trace pitting bilaterally around ankles Neuro: alert & oriented x3, cranial nerves grossly intact. Moves all 4 extremities w/o difficulty. Affect pleasant.   ECG: not done   ASSESSMENT & PLAN:  1: NICM with reduced ejection fraction- - likely due to HTN/ PE or untreated OSA - NYHA class III - euvolemic today - weighing daily; reminded to call for an overnight weight gain of > 2 pounds or a weekly weight gain of > 5 pounds - weight stable from last visit here 1 month ago - ReDs 35% - Echo 05/03/21: EF of 45-50% with moderate LAE.  - Echo 01/29/22: EF of 45-50% along with mild LVH, moderately elevated PA pressure and mild MR. - Echo 11/11/22: EF 20-25% with mild LAE, mild MR, possible infiltrative process.  - discussed doing cMRI to rule out amyloid or sarcoid and she is agreeable to this; will discuss with her general cardiologist and assist with ordering this if they would like  - Lexiscan Myoview 11/10/2022 revealed anterolateral and inferolateral scar without ischemia in the absence of chest pain. Cardiac catheterization was deferred.  - not adding salt and does read food labels for sodium content - continue carvedilol 6.25mg  BID  - continue farxiga 10mg  daily - continue furosemide 20mg  daily - continue potassium BID - continue entresto 49/51mg  BID - due to low BP, unable to begin MRA at this time - she has previously deferred catheterization - saw cardiology (Cheek) 12/24 - BNP 08/18/23 was 2610.1  2: HTN- - BP 94/49 - sees PCP ([facility])  - BMP 08/22/23 reviewed and showed sodium 137, potassium 4.5, creatinine 1.11 & GFR  - BMET today  3: Obstructive sleep apnea- - does not wear CPAP - wearing 2L oxygen at home but doesn't have it on  her because she says that she needs a portable oxygen tank - advised her to call Dr. Reita Cliche office to get an appt scheduled  4:  RA- - saw rheumatologist Allena Katz) 08/24 - continue daily prednisone 5mg  daily  5: PE- - saw hematology Cathie Hoops) 12/24 - continue apixaban 2.5mg  BID - 2L oxygen at home   Return in 2 months, sooner if needed.

## 2023-08-28 ENCOUNTER — Encounter: Payer: Self-pay | Admitting: Student

## 2023-08-28 ENCOUNTER — Other Ambulatory Visit: Payer: Self-pay | Admitting: Student

## 2023-08-28 DIAGNOSIS — I5020 Unspecified systolic (congestive) heart failure: Secondary | ICD-10-CM

## 2023-08-28 LAB — BASIC METABOLIC PANEL
BUN/Creatinine Ratio: 24 (ref 12–28)
BUN: 28 mg/dL — ABNORMAL HIGH (ref 8–27)
CO2: 27 mmol/L (ref 20–29)
Calcium: 9.1 mg/dL (ref 8.7–10.3)
Chloride: 104 mmol/L (ref 96–106)
Creatinine, Ser: 1.15 mg/dL — ABNORMAL HIGH (ref 0.57–1.00)
Glucose: 124 mg/dL — ABNORMAL HIGH (ref 70–99)
Potassium: 4.5 mmol/L (ref 3.5–5.2)
Sodium: 144 mmol/L (ref 134–144)
eGFR: 48 mL/min/{1.73_m2} — ABNORMAL LOW (ref >59)

## 2023-10-14 ENCOUNTER — Other Ambulatory Visit: Payer: Self-pay | Admitting: Family

## 2023-10-24 ENCOUNTER — Other Ambulatory Visit: Payer: Self-pay | Admitting: Student

## 2023-10-24 ENCOUNTER — Ambulatory Visit
Admission: RE | Admit: 2023-10-24 | Discharge: 2023-10-24 | Disposition: A | Discharge status: Home / Self Care | Payer: 59 | Source: Ambulatory Visit | Attending: Student | Admitting: Student

## 2023-10-24 DIAGNOSIS — I5020 Unspecified systolic (congestive) heart failure: Secondary | ICD-10-CM

## 2023-10-24 MED ORDER — GADOBUTROL 1 MMOL/ML IV SOLN
10.0000 mL | Freq: Once | INTRAVENOUS | Status: AC | PRN
  Administered 2023-10-24: 10 mL via INTRAVENOUS

## 2023-10-26 ENCOUNTER — Telehealth: Payer: Self-pay | Admitting: Family

## 2023-10-26 NOTE — Telephone Encounter (Signed)
 Pt confirmed appt for 10/29/23

## 2023-10-26 NOTE — Progress Notes (Unsigned)
 Advanced Heart Failure Clinic Note    PCP: Tonia Ghent, MD  Primary Cardiologist: Marcina Millard, MD/ Sandrea Hughs, NP (last seen 01/25)  Chief Complaint:   HPI:  Ms Beeghly is a 78 y/o female with a history of asthma, insignifcant CAD, HTN, aortic atherosclerosis, asthma, GERD, obstructive sleep apnea, RA, PE (05/23), hyperlipidemia, previous tobacco use and chronic heart failure.   Admitted 11/10/22 due to chest pain and SOB due to a/c heart failure. IV diuresed. Myoview stress test done 3/8 Findings: Abnormal myocardial perfusion scan, Severely depressed left ventricular function less than 25%. Midodrine started. Admitted 11/26/22 due to SOB due to a/c heart failure. Was in the ED 06/05/23 with generalized weakness, fatigue, sinus congestion, cough, and urinary frequency and foul-smelling urine for the last 2 to 3 days along withbilateral submandibular tenderness. Found to have UTI and antibiotics started.     Admitted 08/18/23 due to worsening shortness of breath for several day. Improving edema. + orthopnea and PND. IV diuresed. BNP elevated to 2610.1. HS-troponin normal. CXR noted pulmonary vascular congestion and bilateral interstitial prominence suggestive of pulmonary edema small effusion. Cardiology consulted.   Echo 05/03/21: EF of 45-50% with moderate LAE.  Echo 01/29/22: EF of 45-50% along with mild LVH, moderately elevated PA pressure and mild MR. Echo 11/11/22: EF 20-25% with mild LAE, mild MR, possible infiltrative process.    Lexiscan Myoview 11/10/2022 revealed anterolateral and inferolateral scar without ischemia in the absence of chest pain. Cardiac catheterization was deferred.   cMRI 10/24/23:  IMPRESSION: 1. Normal LV size, severely reduced LV systolic function.  LVEF 23%. 2. There is severe global hypokinesis with akinesis of the LV lateral wall. 3. There is subendocardial (50-75%) late gadolinium enhancement in the basal-mid LV lateral wall. 4. Low normal RV  systolic function. 5. Findings suggest prior lateral wall infarct.   She presents today for a HF follow-up visit with a chief complaint of minimal shortness of breath with exertion. Has occasional dizziness and minimal edema along with this. Denies fatigue, chest pain, cough, palpitations, abdominal distention or difficulty sleeping.   Due to low blood pressure at home, she has only been taking her carvedilol once daily instead of BID. She's done this for the last 2 days and says that her blood pressure has improved.   Has oxygen at home at 2L for PRN use during the day. She does wear it at bedtime but hasn't had to wear it during the day at all.    ROS: All systems negative except as listed in HPI, PMH and Problem List.  SH:  Social History   Socioeconomic History   Marital status: Widowed    Spouse name: Not on file   Number of children: Not on file   Years of education: Not on file   Highest education level: Not on file  Occupational History   Not on file  Tobacco Use   Smoking status: Former    Current packs/day: 0.00    Types: Cigarettes    Quit date: 01/09/1995    Years since quitting: 28.8   Smokeless tobacco: Never  Vaping Use   Vaping status: Never Used  Substance and Sexual Activity   Alcohol use: No   Drug use: No   Sexual activity: Yes    Birth control/protection: None  Other Topics Concern   Not on file  Social History Narrative   Adult son   Social Drivers of Health   Financial Resource Strain: Low Risk  (10/05/2023)  Received from [facility]   Overall Financial Resource Strain (CARDIA)    Difficulty of Paying Living Expenses: Not hard at all  Food Insecurity: No Food Insecurity (10/05/2023)   Received from [facility]   Hunger Vital Sign    Worried About Running Out of Food in the Last Year: Never true    Ran Out of Food in the Last Year: Never true  Transportation Needs: No Transportation Needs (10/05/2023)    Received from [facility] - Transportation    In the past 12 months, has lack of transportation kept you from medical appointments or from getting medications?: No    Lack of Transportation (Non-Medical): No  Physical Activity: Unknown (08/21/2017)   Exercise Vital Sign    Days of Exercise per Week: Patient declined    Minutes of Exercise per Session: Patient declined  Stress: Unknown (08/21/2017)   [employer] of Occupational Health - Occupational Stress Questionnaire    Feeling of Stress : Patient declined  Social Connections: Unknown (08/21/2017)   Social Connection and Isolation Panel [NHANES]    Frequency of Communication with Friends and Family: Patient declined    Frequency of Social Gatherings with Friends and Family: Patient declined    Attends Religious Services: Patient declined    Database administrator or Organizations: Patient declined    Attends Banker Meetings: Patient declined    Marital Status: Patient declined  Intimate Partner Violence: Not At Risk (08/20/2023)   Humiliation, Afraid, Rape, and Kick questionnaire    Fear of Current or Ex-Partner: No    Emotionally Abused: No    Physically Abused: No    Sexually Abused: No    FH:  Family History  Problem Relation Age of Onset   CAD Mother    Diabetes Mother    Hypertension Mother    CAD Father    Diabetes Father    Hypertension Father    Stroke Father     Past Medical History:  Diagnosis Date   Aortic atherosclerosis (HCC)    Asthma    Chest pain 11/10/2022   CHF (congestive heart failure) (HCC)    Coronary artery disease    Dyspnea    Edema of both legs    GERD (gastroesophageal reflux disease)    HFrEF (heart failure with reduced ejection fraction) (HCC)    History of 2019 novel coronavirus disease (COVID-19) 10/28/2020   Hypertension    Myocardial infarction (HCC)    OSA (obstructive sleep apnea)    noncompliant with nocturnal PAP therapy   RA  (rheumatoid arthritis) (HCC)     Current Outpatient Medications  Medication Sig Dispense Refill   acetaminophen (TYLENOL) 650 MG CR tablet Take 1,300 mg by mouth as needed for pain.     albuterol (VENTOLIN HFA) 108 (90 Base) MCG/ACT inhaler Inhale 1-2 puffs into the lungs every 6 (six) hours as needed for wheezing or shortness of breath.     apixaban (ELIQUIS) 2.5 MG TABS tablet Take 1 tablet (2.5 mg total) by mouth 2 (two) times daily. 60 tablet 5   atorvastatin (LIPITOR) 40 MG tablet Take 1 tablet (40 mg total) by mouth every morning. 90 tablet 0   carvedilol (COREG) 6.25 MG tablet Take 1 tablet (6.25 mg total) by mouth 2 (two) times daily. 180 tablet 1   [employer] VITAMIN B12 1000 MCG TBCR Take 1,000 mcg by mouth once a week.     dapagliflozin propanediol (FARXIGA)  10 MG TABS tablet TAKE 1 TABLET BY MOUTH DAILY BEFORE BREAKFAST. 30 tablet 5   fluticasone (FLONASE) 50 MCG/ACT nasal spray Place 2 sprays into the nose daily as needed for allergies.     folic acid (FOLVITE) 1 MG tablet Take 1 mg by mouth daily.     furosemide (LASIX) 20 MG tablet Take 1 tablet (20 mg total) by mouth daily. 90 tablet 0   hydroxychloroquine (PLAQUENIL) 200 MG tablet Take 200 mg by mouth daily.     isosorbide mononitrate (IMDUR) 30 MG 24 hr tablet Take 30 mg by mouth daily.     methotrexate (RHEUMATREX) 2.5 MG tablet Take 15 mg by mouth every Monday.     montelukast (SINGULAIR) 10 MG tablet Take 10 mg by mouth at bedtime.     nitroGLYCERIN (NITROSTAT) 0.4 MG SL tablet Place 1 tablet (0.4 mg total) under the tongue every 5 (five) minutes as needed for chest pain. 30 tablet 0   potassium chloride SA (KLOR-CON M) 20 MEQ tablet Take 1 tablet (20 mEq total) by mouth 2 (two) times daily. 14 tablet 0   predniSONE (DELTASONE) 5 MG tablet Take 5 mg by mouth daily with breakfast.     sacubitril-valsartan (ENTRESTO) 49-51 MG Take 1 tablet by mouth 2 (two) times daily. 180 tablet 3   No current facility-administered medications  for this visit.   Vitals:   10/29/23 1128  BP: 112/66  Pulse: 84  SpO2: 97%   Wt Readings from Last 3 Encounters:  08/27/23 171 lb (77.6 kg)  08/22/23 159 lb 2.8 oz (72.2 kg)  08/16/23 179 lb (81.2 kg)   Lab Results  Component Value Date   CREATININE 1.15 (H) 08/27/2023   CREATININE 1.11 (H) 08/22/2023   CREATININE 1.03 (H) 08/21/2023    PHYSICAL EXAM:  General: Well appearing. No resp difficulty HEENT: normal Neck: supple, no JVD Cor: Regular rhythm, rate. No rubs, gallops or murmurs Lungs: clear Abdomen: soft, nontender, nondistended. Extremities: no cyanosis, clubbing, rash, trace pitting edema bilateral lower legs, deformed fingers due to RA Neuro: alert & oriented X 3. Moves all 4 extremities w/o difficulty. Affect pleasant   ECG: not done   ASSESSMENT & PLAN:  1: NICM with reduced ejection fraction- - likely due to HTN/ PE or untreated OSA - NYHA class II - euvolemic today - weighing daily; reminded to call for an overnight weight gain of > 2 pounds or a weekly weight gain of > 5 pounds - Echo 05/03/21: EF of 45-50% with moderate LAE.  - Echo 01/29/22: EF of 45-50% along with mild LVH, moderately elevated PA pressure and mild MR. - Echo 11/11/22: EF 20-25% with mild LAE, mild MR, possible infiltrative process.  Eugenie Birks Myoview 11/10/2022 revealed anterolateral and inferolateral scar without ischemia in the absence of chest pain. Cardiac catheterization was deferred.  - cMRI 10/24/23:  IMPRESSION: 1. LV size, severely reduced LV systolic function.  LVEF 23%. 2. There is severe global hypokinesis with akinesis of the LV lateral wall. 3. There is subendocardial (50-75%) late gadolinium enhancement in the basal-mid LV lateral wall. 4. Low normal RV systolic function. 5. Findings suggest prior lateral wall infarct.  - may benefit from EP referral, will let Dr Darrold Junker office manage this - not adding salt and does read food labels for sodium content - change  carvedilol to 3.125mg  BID as she's only been taking 6.25mg  in the mornig  - continue farxiga 10mg  daily - continue furosemide 20mg  daily - continue potassium  QD - continue entresto 49/51mg  BID - due to low BP, unable to begin MRA at this time - she has previously deferred catheterization - saw cardiology Ann Maki) 01/25; returns next week - BNP 08/18/23 was 2610.1  2: HTN- - BP 112/66 - adjusting carvedilol per above - sees PCP ([facility])  - CMP 10/03/23 reviewed and showed sodium 143, potassium 3.9, creatinine 1 & GFR 57  3: Obstructive sleep apnea- - does not wear CPAP - wearing 2L oxygen at bedtime and PRN during the day - saw pulmonology Meredeth Ide) 01/25  4: RA- - saw rheumatologist Allena Katz) 08/24 - continue daily prednisone 5mg  daily  5: PE- - saw hematology Cathie Hoops) 12/24 - continue apixaban 2.5mg  BID - 2L oxygen at home at bedtime and PRN during the day   Due to close cardiology f/u, will not make a return appointment at this time. Advised patient to follow closely with cardiology but that she could call or return if needed. She was comfortable with this plan.   Delma Freeze, FNP 10/26/23

## 2023-10-29 ENCOUNTER — Ambulatory Visit: Payer: 59 | Attending: Family | Admitting: Family

## 2023-10-29 ENCOUNTER — Encounter: Payer: Self-pay | Admitting: Family

## 2023-10-29 VITALS — BP 112/66 | HR 84

## 2023-10-29 DIAGNOSIS — Z86711 Personal history of pulmonary embolism: Secondary | ICD-10-CM | POA: Diagnosis not present

## 2023-10-29 DIAGNOSIS — Z79899 Other long term (current) drug therapy: Secondary | ICD-10-CM | POA: Insufficient documentation

## 2023-10-29 DIAGNOSIS — I10 Essential (primary) hypertension: Secondary | ICD-10-CM

## 2023-10-29 DIAGNOSIS — Z87891 Personal history of nicotine dependence: Secondary | ICD-10-CM | POA: Diagnosis not present

## 2023-10-29 DIAGNOSIS — Z7901 Long term (current) use of anticoagulants: Secondary | ICD-10-CM | POA: Diagnosis not present

## 2023-10-29 DIAGNOSIS — J45909 Unspecified asthma, uncomplicated: Secondary | ICD-10-CM | POA: Diagnosis not present

## 2023-10-29 DIAGNOSIS — E785 Hyperlipidemia, unspecified: Secondary | ICD-10-CM | POA: Insufficient documentation

## 2023-10-29 DIAGNOSIS — M069 Rheumatoid arthritis, unspecified: Secondary | ICD-10-CM | POA: Diagnosis not present

## 2023-10-29 DIAGNOSIS — K219 Gastro-esophageal reflux disease without esophagitis: Secondary | ICD-10-CM | POA: Diagnosis not present

## 2023-10-29 DIAGNOSIS — R0602 Shortness of breath: Secondary | ICD-10-CM | POA: Diagnosis present

## 2023-10-29 DIAGNOSIS — I700 Atherosclerosis of aorta: Secondary | ICD-10-CM | POA: Insufficient documentation

## 2023-10-29 DIAGNOSIS — I2782 Chronic pulmonary embolism: Secondary | ICD-10-CM

## 2023-10-29 DIAGNOSIS — I5022 Chronic systolic (congestive) heart failure: Secondary | ICD-10-CM | POA: Diagnosis not present

## 2023-10-29 DIAGNOSIS — I428 Other cardiomyopathies: Secondary | ICD-10-CM | POA: Diagnosis not present

## 2023-10-29 DIAGNOSIS — I110 Hypertensive heart disease with heart failure: Secondary | ICD-10-CM | POA: Insufficient documentation

## 2023-10-29 DIAGNOSIS — Z7952 Long term (current) use of systemic steroids: Secondary | ICD-10-CM | POA: Insufficient documentation

## 2023-10-29 DIAGNOSIS — G4733 Obstructive sleep apnea (adult) (pediatric): Secondary | ICD-10-CM | POA: Diagnosis not present

## 2023-10-29 NOTE — Patient Instructions (Addendum)
 Decrease your carvedilol to 1/2 tablet in the morning and 1/2 tablet in the evening. Continue to watch your blood pressure.    Call us in the future if you need [place] for anything

## 2023-11-11 ENCOUNTER — Other Ambulatory Visit: Payer: Self-pay | Admitting: Family

## 2024-02-13 ENCOUNTER — Inpatient Hospital Stay: Payer: 59

## 2024-02-13 ENCOUNTER — Inpatient Hospital Stay: Payer: 59 | Admitting: Oncology

## 2024-03-06 ENCOUNTER — Inpatient Hospital Stay (HOSPITAL_BASED_OUTPATIENT_CLINIC_OR_DEPARTMENT_OTHER): Admitting: Oncology

## 2024-03-06 ENCOUNTER — Encounter: Payer: Self-pay | Admitting: Oncology

## 2024-03-06 ENCOUNTER — Inpatient Hospital Stay: Attending: Oncology

## 2024-03-06 VITALS — BP 125/74 | HR 61 | Temp 97.4°F | Resp 18 | Wt 179.5 lb

## 2024-03-06 DIAGNOSIS — Z86711 Personal history of pulmonary embolism: Secondary | ICD-10-CM

## 2024-03-06 DIAGNOSIS — Z87891 Personal history of nicotine dependence: Secondary | ICD-10-CM | POA: Diagnosis not present

## 2024-03-06 DIAGNOSIS — D7589 Other specified diseases of blood and blood-forming organs: Secondary | ICD-10-CM | POA: Diagnosis not present

## 2024-03-06 DIAGNOSIS — M069 Rheumatoid arthritis, unspecified: Secondary | ICD-10-CM | POA: Insufficient documentation

## 2024-03-06 DIAGNOSIS — Z7901 Long term (current) use of anticoagulants: Secondary | ICD-10-CM | POA: Insufficient documentation

## 2024-03-06 DIAGNOSIS — Z86718 Personal history of other venous thrombosis and embolism: Secondary | ICD-10-CM | POA: Diagnosis present

## 2024-03-06 LAB — CBC WITH DIFFERENTIAL (CANCER CENTER ONLY)
Abs Immature Granulocytes: 0.01 10*3/uL (ref 0.00–0.07)
Basophils Absolute: 0 10*3/uL (ref 0.0–0.1)
Basophils Relative: 0 %
Eosinophils Absolute: 0 10*3/uL (ref 0.0–0.5)
Eosinophils Relative: 0 %
HCT: 41 % (ref 36.0–46.0)
Hemoglobin: 13.2 g/dL (ref 12.0–15.0)
Immature Granulocytes: 0 %
Lymphocytes Relative: 12 %
Lymphs Abs: 0.6 10*3/uL — ABNORMAL LOW (ref 0.7–4.0)
MCH: 34.6 pg — ABNORMAL HIGH (ref 26.0–34.0)
MCHC: 32.2 g/dL (ref 30.0–36.0)
MCV: 107.3 fL — ABNORMAL HIGH (ref 80.0–100.0)
Monocytes Absolute: 0.4 10*3/uL (ref 0.1–1.0)
Monocytes Relative: 8 %
Neutro Abs: 3.8 10*3/uL (ref 1.7–7.7)
Neutrophils Relative %: 80 %
Platelet Count: 168 10*3/uL (ref 150–400)
RBC: 3.82 MIL/uL — ABNORMAL LOW (ref 3.87–5.11)
RDW: 14.3 % (ref 11.5–15.5)
WBC Count: 4.8 10*3/uL (ref 4.0–10.5)
nRBC: 0 % (ref 0.0–0.2)

## 2024-03-06 LAB — CMP (CANCER CENTER ONLY)
ALT: 13 U/L (ref 0–44)
AST: 17 U/L (ref 15–41)
Albumin: 3.1 g/dL — ABNORMAL LOW (ref 3.5–5.0)
Alkaline Phosphatase: 53 U/L (ref 38–126)
Anion gap: 4 — ABNORMAL LOW (ref 5–15)
BUN: 27 mg/dL — ABNORMAL HIGH (ref 8–23)
CO2: 27 mmol/L (ref 22–32)
Calcium: 8.5 mg/dL — ABNORMAL LOW (ref 8.9–10.3)
Chloride: 107 mmol/L (ref 98–111)
Creatinine: 1.11 mg/dL — ABNORMAL HIGH (ref 0.44–1.00)
GFR, Estimated: 50 mL/min — ABNORMAL LOW (ref >60)
Glucose, Bld: 112 mg/dL — ABNORMAL HIGH (ref 70–99)
Potassium: 4.1 mmol/L (ref 3.5–5.1)
Sodium: 138 mmol/L (ref 135–145)
Total Bilirubin: 1 mg/dL (ref 0.0–1.2)
Total Protein: 6.5 g/dL (ref 6.5–8.1)

## 2024-03-06 LAB — VITAMIN B12: Vitamin B-12: 2225 pg/mL — ABNORMAL HIGH (ref 180–914)

## 2024-03-06 MED ORDER — APIXABAN 2.5 MG PO TABS: 2.5000 mg | ORAL_TABLET | Freq: Two times a day (BID) | ORAL | 1 refills | Status: DC

## 2024-03-06 NOTE — Progress Notes (Signed)
 Hematology/Oncology Progress note [phone] [phone]      Patient Care Team: Kandis Stefano Iles, MD as PCP - General (Family Medicine) Babara Call, MD as Consulting Physician (Oncology)  ASSESSMENT & PLAN:   History of pulmonary embolism Unprovoked pulmonary embolism She has finished 6 months of therapeutic anticoagulation. hypercoagulable work up negative.  Continue Eliquis  2.5mg  BID.  Prescription was sent to pharmacy. Recommend age appropriate cancer screening.   Macrocytosis without anemia Hemoglobin is within normal limits Slight macrocytosis, persistent.likely from methotrexate .  check B12- results are pending Continue B12 and folate supplementation.   Hypocalcemia Recommend calcium  supplementation.  I discussed case with her heart failure clinic provider [facility], pt will be seen tomorrow.    Orders Placed This Encounter  Procedures   CMP (Cancer Center only)    Standing Status:   Future    Expected Date:   09/06/2024    Expiration Date:   12/05/2024   CBC with Differential (Cancer Center Only)    Standing Status:   Future    Expected Date:   09/06/2024    Expiration Date:   12/05/2024   Vitamin B12    Standing Status:   Future    Expected Date:   09/06/2024    Expiration Date:   12/05/2024   Folate    Standing Status:   Future    Expected Date:   09/06/2024    Expiration Date:   12/05/2024   Follow up in 6 months. All questions were answered. The patient knows to call the clinic with any problems, questions or concerns. No barriers to learning was detected.  Call Babara, MD 03/06/2024   CHIEF COMPLAINTS/PURPOSE OF CONSULTATION:  Pulmonary Embolism  HISTORY OF PRESENTING ILLNESS:  Tina Fields 78 y.o. female is referred to establish care for pulmonary embolism.   01/28/22 - 02/01/22 patient presented for evaluation of SOB. She was found to be hypoxic.  Work up included CTa which was positive for acute pulmonary embolism,  CT angio  chest 01/28/22: Positive for acute PE with CT evidence of right heart strain (RV/LV ratio: 1.06) consistent with submassive PE. Extensive bilateral ground glass opacities with underlying changes of COPD. In the presence of interval mediastinal and bilateral hilar adenopathy, this is most likely infectious in origin with associated reactive adenopathy. Cardiomegaly. Previously demonstrated liver and bilateral renal cysts. Hiatal hernia. Patient denies any triggering immobilization factors. She was started on heparin  gtt and transitioned to Elqiuis at discharge.  Venous Doppler did not show DVT in lower extremities.  She was also treated with community pneumonia.  Family history of lung cancer - paternal aunt.  Appetite is fair. She is accompanied by cousin.    INTERVAL HISTORY Tina Fields is a 78 y.o. female who has above history reviewed by me today presents for follow up visit for management of history of pulmonary embolism She feels well today. No new complaint.  She uses nasal cannula oxygen as needed at home. She reports feeling very tried and shortness of breath. Not hypoxic in our clinic. She states that her fluid pills have been recently adjusted.  She is not taking Lasix  currently.  She felt better after using nasal cannula oxygen 2L in our clinic.  No chest pain nausea vomiting, diarrhea, fever.   MEDICAL HISTORY:  Past Medical History:  Diagnosis Date   Aortic atherosclerosis (HCC)    Asthma    Chest pain 11/10/2022   CHF (congestive heart failure) (HCC)    Coronary artery  disease    Dyspnea    Edema of both legs    GERD (gastroesophageal reflux disease)    HFrEF (heart failure with reduced ejection fraction) (HCC)    History of 2019 novel coronavirus disease (COVID-19) 10/28/2020   Hypertension    Myocardial infarction (HCC)    OSA (obstructive sleep apnea)    noncompliant with nocturnal PAP therapy   RA (rheumatoid arthritis) (HCC)     SURGICAL HISTORY: Past  Surgical History:  Procedure Laterality Date   BREAST SURGERY     CARDIAC CATHETERIZATION Left 06/21/2005   Moderate ostial LCx and LAD disease; LVEF 45%; Location: [facility]; Surgeon: Denyse Bathe, MD   CARDIAC CATHETERIZATION Left 05/20/2008   No occlusive CAD; LVEF 65%; Location: [facility]; Surgeon: Margie Lovelace, MD   COLONOSCOPY     ESOPHAGOGASTRODUODENOSCOPY     TUBAL LIGATION      SOCIAL HISTORY: Social History   Socioeconomic History   Marital status: Widowed    Spouse name: Not on file   Number of children: Not on file   Years of education: Not on file   Highest education level: Not on file  Occupational History   Not on file  Tobacco Use   Smoking status: Former    Current packs/day: 0.00    Types: Cigarettes    Quit date: 01/09/1995    Years since quitting: 29.1   Smokeless tobacco: Never  Vaping Use   Vaping status: Never Used  Substance and Sexual Activity   Alcohol use: No   Drug use: No   Sexual activity: Yes    Birth control/protection: None  Other Topics Concern   Not on file  Social History Narrative   Adult son   Social Drivers of Health   Financial Resource Strain: Medium Risk (02/11/2024)   Received from [facility]   Overall Financial Resource Strain (CARDIA)    Difficulty of Paying Living Expenses: Somewhat hard  Food Insecurity: Food Insecurity Present (02/11/2024)   Received from [facility]   Hunger Vital Sign    Within the past 12 months, you worried that your food would run out before you got the money to buy more.: Sometimes true    Within the past 12 months, the food you bought just didn't last and you didn't have money to get more.: Sometimes true  Transportation Needs: No Transportation Needs (02/11/2024)   Received from [facility] - Transportation    In the past 12 months, has lack of transportation kept you from medical appointments or from getting medications?: No    Lack of  Transportation (Non-Medical): No  Physical Activity: Unknown (08/21/2017)   Exercise Vital Sign    Days of Exercise per Week: Patient declined    Minutes of Exercise per Session: Patient declined  Stress: Unknown (08/21/2017)   [employer] of Occupational Health - Occupational Stress Questionnaire    Feeling of Stress : Patient declined  Social Connections: Unknown (08/21/2017)   Social Connection and Isolation Panel    Frequency of Communication with Friends and Family: Patient declined    Frequency of Social Gatherings with Friends and Family: Patient declined    Attends Religious Services: Patient declined    Active Member of Clubs or Organizations: Patient declined    Attends Banker Meetings: Patient declined    Marital Status: Patient declined  Intimate Partner Violence: Not At Risk (08/20/2023)   Humiliation, Afraid, Rape, and Kick questionnaire  Fear of Current or Ex-Partner: No    Emotionally Abused: No    Physically Abused: No    Sexually Abused: No    FAMILY HISTORY: Family History  Problem Relation Age of Onset   CAD Mother    Diabetes Mother    Hypertension Mother    CAD Father    Diabetes Father    Hypertension Father    Stroke Father     ALLERGIES:  is allergic to ace inhibitors, hydrochlorothiazide, latex, azithromycin, and jardiance  [empagliflozin ].  MEDICATIONS:  Current Outpatient Medications  Medication Sig Dispense Refill   acetaminophen  (TYLENOL ) 650 MG CR tablet Take 1,300 mg by mouth as needed for pain.     albuterol  (VENTOLIN  HFA) 108 (90 Base) MCG/ACT inhaler Inhale 1-2 puffs into the lungs every 6 (six) hours as needed for wheezing or shortness of breath.     atorvastatin  (LIPITOR ) 40 MG tablet Take 1 tablet (40 mg total) by mouth every morning. 90 tablet 0   carvedilol  (COREG ) 6.25 MG tablet Take 0.5 tablets (3.125 mg total) by mouth 2 (two) times daily with a meal. 90 tablet 3   CVS VITAMIN B12 1000 MCG TBCR Take 1,000 mcg  by mouth once a week.     dapagliflozin  propanediol (FARXIGA ) 10 MG TABS tablet TAKE 1 TABLET BY MOUTH DAILY BEFORE BREAKFAST. 30 tablet 5   fluticasone  (FLONASE ) 50 MCG/ACT nasal spray Place 2 sprays into the nose daily as needed for allergies.     folic acid  (FOLVITE ) 1 MG tablet Take 1 mg by mouth daily.     furosemide  (LASIX ) 20 MG tablet Take 1 tablet (20 mg total) by mouth daily. 90 tablet 0   hydroxychloroquine  (PLAQUENIL ) 200 MG tablet Take 200 mg by mouth daily.     isosorbide  mononitrate (IMDUR ) 30 MG 24 hr tablet Take 30 mg by mouth daily.     methotrexate  (RHEUMATREX) 2.5 MG tablet Take 15 mg by mouth every Monday.     montelukast  (SINGULAIR ) 10 MG tablet Take 10 mg by mouth at bedtime.     nitroGLYCERIN  (NITROSTAT ) 0.4 MG SL tablet Place 1 tablet (0.4 mg total) under the tongue every 5 (five) minutes as needed for chest pain. 30 tablet 0   potassium chloride  SA (KLOR-CON  M) 20 MEQ tablet Take 1 tablet (20 mEq total) by mouth 2 (two) times daily. 14 tablet 0   predniSONE  (DELTASONE ) 5 MG tablet Take 5 mg by mouth daily with breakfast.     sacubitril -valsartan  (ENTRESTO ) 49-51 MG Take 1 tablet by mouth 2 (two) times daily. 180 tablet 3   apixaban  (ELIQUIS ) 2.5 MG TABS tablet Take 1 tablet (2.5 mg total) by mouth 2 (two) times daily. 180 tablet 1   No current facility-administered medications for this visit.    Review of Systems  Constitutional:  Positive for fatigue. Negative for appetite change, chills and fever.  HENT:   Negative for hearing loss and voice change.   Eyes:  Negative for eye problems.  Respiratory:  Negative for chest tightness, cough and shortness of breath.   Cardiovascular:  Negative for chest pain.  Gastrointestinal:  Negative for abdominal distention, abdominal pain and blood in stool.  Endocrine: Negative for hot flashes.  Genitourinary:  Negative for difficulty urinating and frequency.   Musculoskeletal:  Negative for arthralgias.  Skin:  Negative for  itching and rash.  Neurological:  Negative for extremity weakness.  Hematological:  Negative for adenopathy.  Psychiatric/Behavioral:  Negative for confusion.  PHYSICAL EXAMINATION: Vitals:   03/06/24 1338  BP: 125/74  Pulse: 61  Resp: 18  Temp: (!) 97.4 F (36.3 C)   Filed Weights   03/06/24 1338  Weight: 179 lb 8 oz (81.4 kg)    Physical Exam Constitutional:      General: She is not in acute distress.    Appearance: She is not diaphoretic.     Comments: Patient sits in the wheelchair.   HENT:     Head: Normocephalic and atraumatic.  Eyes:     General: No scleral icterus. Cardiovascular:     Rate and Rhythm: Normal rate and regular rhythm.     Heart sounds: No murmur heard. Pulmonary:     Effort: Pulmonary effort is normal. No respiratory distress.  Abdominal:     Palpations: Abdomen is soft.  Musculoskeletal:        General: Normal range of motion.     Cervical back: Normal range of motion and neck supple.  Skin:    General: Skin is warm and dry.     Findings: No erythema.  Neurological:     Mental Status: She is alert and oriented to person, place, and time. Mental status is at baseline.     Motor: No abnormal muscle tone.  Psychiatric:        Mood and Affect: Mood and affect normal.      LABORATORY DATA:  I have reviewed the data as listed Lab Results  Component Value Date   WBC 4.8 03/06/2024   HGB 13.2 03/06/2024   HCT 41.0 03/06/2024   MCV 107.3 (H) 03/06/2024   PLT 168 03/06/2024   Recent Labs    08/15/23 0842 08/16/23 1337 08/21/23 0420 08/22/23 0422 08/27/23 1207 03/06/24 1320  NA 143   < > 138 137 144 138  K 2.9*   < > 4.2 4.5 4.5 4.1  CL 109   < > 100 98 104 107  CO2 28   < > 28 28 27 27   GLUCOSE 120*   < > 110* 119* 124* 112*  BUN 19   < > 31* 39* 28* 27*  CREATININE 0.78   < > 1.03* 1.11* 1.15* 1.11*  CALCIUM  8.4*   < > 8.3* 8.7* 9.1 8.5*  GFRNONAA >60   < > 55* 51*  --  50*  PROT 6.5  --   --   --   --  6.5  ALBUMIN  3.2*  --   --   --   --  3.1*  AST 12*  --   --   --   --  17  ALT 12  --   --   --   --  13  ALKPHOS 57  --   --   --   --  53  BILITOT 1.3*  --   --   --   --  1.0   < > = values in this interval not displayed.    RADIOGRAPHIC STUDIES: I have personally reviewed the radiological images as listed and agreed with the findings in the report. No results found.

## 2024-03-06 NOTE — Assessment & Plan Note (Signed)
Recommend calcium supplementation 

## 2024-03-06 NOTE — Assessment & Plan Note (Signed)
Unprovoked pulmonary embolism She has finished 6 months of therapeutic anticoagulation. hypercoagulable work up negative.  Continue Eliquis 2.5mg  BID.  Prescription was sent to pharmacy. Recommend age appropriate cancer screening.

## 2024-03-06 NOTE — Assessment & Plan Note (Addendum)
 Hemoglobin is within normal limits Slight macrocytosis, persistent.likely from methotrexate .  check B12- results are pending Continue B12 and folate supplementation.

## 2024-04-11 ENCOUNTER — Other Ambulatory Visit: Payer: Self-pay

## 2024-04-11 MED ORDER — APIXABAN 2.5 MG PO TABS: 2.5000 mg | ORAL_TABLET | Freq: Two times a day (BID) | ORAL | 3 refills | Status: AC

## 2024-04-28 ENCOUNTER — Other Ambulatory Visit: Payer: Self-pay | Admitting: Family

## 2024-05-14 ENCOUNTER — Other Ambulatory Visit: Payer: Self-pay | Admitting: Family

## 2024-09-08 ENCOUNTER — Inpatient Hospital Stay: Attending: Oncology

## 2024-09-08 ENCOUNTER — Encounter: Payer: Self-pay | Admitting: Oncology

## 2024-09-08 ENCOUNTER — Inpatient Hospital Stay: Admitting: Oncology

## 2024-09-08 VITALS — BP 133/57 | HR 66 | Temp 96.8°F | Resp 18 | Wt 183.1 lb

## 2024-09-08 DIAGNOSIS — J449 Chronic obstructive pulmonary disease, unspecified: Secondary | ICD-10-CM | POA: Insufficient documentation

## 2024-09-08 DIAGNOSIS — D7589 Other specified diseases of blood and blood-forming organs: Secondary | ICD-10-CM | POA: Insufficient documentation

## 2024-09-08 DIAGNOSIS — Z87891 Personal history of nicotine dependence: Secondary | ICD-10-CM | POA: Insufficient documentation

## 2024-09-08 DIAGNOSIS — Z86711 Personal history of pulmonary embolism: Secondary | ICD-10-CM | POA: Diagnosis not present

## 2024-09-08 DIAGNOSIS — Z7901 Long term (current) use of anticoagulants: Secondary | ICD-10-CM | POA: Insufficient documentation

## 2024-09-08 DIAGNOSIS — E538 Deficiency of other specified B group vitamins: Secondary | ICD-10-CM | POA: Insufficient documentation

## 2024-09-08 LAB — CMP (CANCER CENTER ONLY)
ALT: 13 U/L (ref 0–44)
AST: 19 U/L (ref 15–41)
Albumin: 3.5 g/dL (ref 3.5–5.0)
Alkaline Phosphatase: 61 U/L (ref 38–126)
Anion gap: 8 (ref 5–15)
BUN: 18 mg/dL (ref 8–23)
CO2: 28 mmol/L (ref 22–32)
Calcium: 8.7 mg/dL — ABNORMAL LOW (ref 8.9–10.3)
Chloride: 104 mmol/L (ref 98–111)
Creatinine: 0.98 mg/dL (ref 0.44–1.00)
GFR, Estimated: 58 mL/min — ABNORMAL LOW
Glucose, Bld: 124 mg/dL — ABNORMAL HIGH (ref 70–99)
Potassium: 3.4 mmol/L — ABNORMAL LOW (ref 3.5–5.1)
Sodium: 139 mmol/L (ref 135–145)
Total Bilirubin: 0.7 mg/dL (ref 0.0–1.2)
Total Protein: 6.4 g/dL — ABNORMAL LOW (ref 6.5–8.1)

## 2024-09-08 LAB — CBC WITH DIFFERENTIAL (CANCER CENTER ONLY)
Abs Immature Granulocytes: 0.01 K/uL (ref 0.00–0.07)
Basophils Absolute: 0 K/uL (ref 0.0–0.1)
Basophils Relative: 0 %
Eosinophils Absolute: 0 K/uL (ref 0.0–0.5)
Eosinophils Relative: 0 %
HCT: 38.9 % (ref 36.0–46.0)
Hemoglobin: 12.8 g/dL (ref 12.0–15.0)
Immature Granulocytes: 0 %
Lymphocytes Relative: 32 %
Lymphs Abs: 1.2 K/uL (ref 0.7–4.0)
MCH: 34.6 pg — ABNORMAL HIGH (ref 26.0–34.0)
MCHC: 32.9 g/dL (ref 30.0–36.0)
MCV: 105.1 fL — ABNORMAL HIGH (ref 80.0–100.0)
Monocytes Absolute: 0.3 K/uL (ref 0.1–1.0)
Monocytes Relative: 7 %
Neutro Abs: 2.2 K/uL (ref 1.7–7.7)
Neutrophils Relative %: 61 %
Platelet Count: 175 K/uL (ref 150–400)
RBC: 3.7 MIL/uL — ABNORMAL LOW (ref 3.87–5.11)
RDW: 14.6 % (ref 11.5–15.5)
WBC Count: 3.7 K/uL — ABNORMAL LOW (ref 4.0–10.5)
nRBC: 0 % (ref 0.0–0.2)

## 2024-09-08 LAB — FOLATE: Folate: >20 ng/mL

## 2024-09-08 LAB — VITAMIN B12: Vitamin B-12: 2845 pg/mL — ABNORMAL HIGH (ref 180–914)

## 2024-09-08 NOTE — Assessment & Plan Note (Addendum)
 Unprovoked pulmonary embolism She has finished 6 months of therapeutic anticoagulation. hypercoagulable work up negative.  Continue Eliquis  2.5mg  BID.  Recommend age appropriate cancer screening.

## 2024-09-08 NOTE — Assessment & Plan Note (Addendum)
 Hemoglobin is within normal limits Slight macrocytosis, persistent.likely from methotrexate .  B12 level is elevated. Hold off B12 supplementation.

## 2024-09-08 NOTE — Progress Notes (Addendum)
 Hematology/Oncology Progress note [phone] [phone]      Patient Care Team: Kandis Stefano Iles, MD as PCP - General (Family Medicine) Babara Call, MD as Consulting Physician (Oncology)  ASSESSMENT & PLAN:   History of pulmonary embolism Unprovoked pulmonary embolism She has finished 6 months of therapeutic anticoagulation. hypercoagulable work up negative.  Continue Eliquis  2.5mg  BID.  Recommend age appropriate cancer screening.   Macrocytosis without anemia Hemoglobin is within normal limits Slight macrocytosis, persistent.likely from methotrexate .  B12 level is elevated. Hold off B12 supplementation.     Orders Placed This Encounter  Procedures   CMP (Cancer Center only)    Standing Status:   Future    Expected Date:   03/08/2025    Expiration Date:   06/06/2025   CBC with Differential (Cancer Center Only)    Standing Status:   Future    Expected Date:   03/08/2025    Expiration Date:   06/06/2025   Follow up in 6 months. All questions were answered. The patient knows to call the clinic with any problems, questions or concerns. No barriers to learning was detected.  Call Babara, MD 09/08/2024   CHIEF COMPLAINTS/PURPOSE OF CONSULTATION:  Pulmonary Embolism  HISTORY OF PRESENTING ILLNESS:  Tina Fields 79 y.o. female is referred to establish care for pulmonary embolism.   01/28/22 - 02/01/22 patient presented for evaluation of SOB. She was found to be hypoxic.  Work up included CTa which was positive for acute pulmonary embolism,  CT angio chest 01/28/22: Positive for acute PE with CT evidence of right heart strain (RV/LV ratio: 1.06) consistent with submassive PE. Extensive bilateral ground glass opacities with underlying changes of COPD. In the presence of interval mediastinal and bilateral hilar adenopathy, this is most likely infectious in origin with associated reactive adenopathy. Cardiomegaly. Previously demonstrated liver and bilateral  renal cysts. Hiatal hernia. Patient denies any triggering immobilization factors. She was started on heparin  gtt and transitioned to Elqiuis at discharge.  Venous Doppler did not show DVT in lower extremities.  She was also treated with community pneumonia.  Family history of lung cancer - paternal aunt.  Appetite is fair. She is accompanied by cousin.    INTERVAL HISTORY Tina Fields is a 79 y.o. female who has above history reviewed by me today presents for follow up visit for management of history of pulmonary embolism She feels well today. No new complaint.  No chest pain nausea vomiting, diarrhea, fever.   MEDICAL HISTORY:  Past Medical History:  Diagnosis Date   Aortic atherosclerosis    Asthma    Chest pain 11/10/2022   CHF (congestive heart failure) (HCC)    Coronary artery disease    Dyspnea    Edema of both legs    GERD (gastroesophageal reflux disease)    HFrEF (heart failure with reduced ejection fraction) (HCC)    History of 2019 novel coronavirus disease (COVID-19) 10/28/2020   Hypertension    Myocardial infarction (HCC)    OSA (obstructive sleep apnea)    noncompliant with nocturnal PAP therapy   RA (rheumatoid arthritis) (HCC)     SURGICAL HISTORY: Past Surgical History:  Procedure Laterality Date   BREAST SURGERY     CARDIAC CATHETERIZATION Left 06/21/2005   Moderate ostial LCx and LAD disease; LVEF 45%; Location: [facility]; Surgeon: Denyse Bathe, MD   CARDIAC CATHETERIZATION Left 05/20/2008   No occlusive CAD; LVEF 65%; Location: [facility]; Surgeon: Margie Lovelace, MD   COLONOSCOPY     ESOPHAGOGASTRODUODENOSCOPY  TUBAL LIGATION      SOCIAL HISTORY: Social History   Socioeconomic History   Marital status: Widowed    Spouse name: Not on file   Number of children: Not on file   Years of education: Not on file   Highest education level: Not on file  Occupational History   Not on file  Tobacco Use   Smoking status: Former    Current packs/day:  0.00    Types: Cigarettes    Quit date: 01/09/1995    Years since quitting: 29.6   Smokeless tobacco: Never  Vaping Use   Vaping status: Never Used  Substance and Sexual Activity   Alcohol use: No   Drug use: No   Sexual activity: Yes    Birth control/protection: None  Other Topics Concern   Not on file  Social History Narrative   Adult son   Social Drivers of Health   Tobacco Use: Medium Risk (09/08/2024)   Patient History    Smoking Tobacco Use: Former    Smokeless Tobacco Use: Never    Passive Exposure: Not on file  Financial Resource Strain: Medium Risk (02/11/2024)   Received from [facility]   Overall Financial Resource Strain (CARDIA)    Difficulty of Paying Living Expenses: Somewhat hard  Food Insecurity: Food Insecurity Present (02/11/2024)   Received from [facility]   Epic    Within the past 12 months, you worried that your food would run out before you got the money to buy more.: Sometimes true    Within the past 12 months, the food you bought just didn't last and you didn't have money to get more.: Sometimes true  Transportation Needs: No Transportation Needs (02/11/2024)   Received from [facility] - Transportation    In the past 12 months, has lack of transportation kept you from medical appointments or from getting medications?: No    Lack of Transportation (Non-Medical): No  Physical Activity: Not on file  Stress: Not on file  Social Connections: Not on file  Intimate Partner Violence: Not At Risk (08/20/2023)   Humiliation, Afraid, Rape, and Kick questionnaire    Fear of Current or Ex-Partner: No    Emotionally Abused: No    Physically Abused: No    Sexually Abused: No  Depression (PHQ2-9): Low Risk (03/06/2024)   Depression (PHQ2-9)    PHQ-2 Score: 0  Alcohol Screen: Not on file  Housing: Low Risk  (02/11/2024)   Received from [facility]   Epic    In the last 12 months, was  there a time when you were not able to pay the mortgage or rent on time?: No    In the past 12 months, how many times have you moved where you were living?: 1    At any time in the past 12 months, were you homeless or living in a shelter (including now)?: No  Utilities: Not At Risk (02/11/2024)   Received from [facility]   Epic    In the past 12 months has the electric, gas, oil, or water company threatened to shut off services in your home?: No  Health Literacy: Not on file    FAMILY HISTORY: Family History  Problem Relation Age of Onset   CAD Mother    Diabetes Mother    Hypertension Mother    CAD Father    Diabetes Father    Hypertension Father  Stroke Father     ALLERGIES:  is allergic to ace inhibitors, hydrochlorothiazide, latex, azithromycin, and jardiance  [empagliflozin ].  MEDICATIONS:  Current Outpatient Medications  Medication Sig Dispense Refill   acetaminophen  (TYLENOL ) 650 MG CR tablet Take 1,300 mg by mouth as needed for pain.     albuterol  (VENTOLIN  HFA) 108 (90 Base) MCG/ACT inhaler Inhale 1-2 puffs into the lungs every 6 (six) hours as needed for wheezing or shortness of breath.     apixaban  (ELIQUIS ) 2.5 MG TABS tablet Take 1 tablet (2.5 mg total) by mouth 2 (two) times daily. 180 tablet 3   atorvastatin  (LIPITOR ) 40 MG tablet Take 1 tablet (40 mg total) by mouth every morning. 90 tablet 0   carvedilol  (COREG ) 6.25 MG tablet Take 0.5 tablets (3.125 mg total) by mouth 2 (two) times daily with a meal. 90 tablet 3   [employer] VITAMIN B12 1000 MCG TBCR Take 1,000 mcg by mouth once a week.     dapagliflozin  propanediol (FARXIGA ) 10 MG TABS tablet TAKE 1 TABLET BY MOUTH DAILY BEFORE BREAKFAST. 30 tablet 11   fluticasone  (FLONASE ) 50 MCG/ACT nasal spray Place 2 sprays into the nose daily as needed for allergies.     folic acid  (FOLVITE ) 1 MG tablet Take 1 mg by mouth daily.     furosemide  (LASIX ) 20 MG tablet Take 1 tablet (20 mg total) by mouth daily. 90  tablet 0   hydroxychloroquine  (PLAQUENIL ) 200 MG tablet Take 200 mg by mouth daily.     isosorbide  mononitrate (IMDUR ) 30 MG 24 hr tablet Take 30 mg by mouth daily.     methotrexate  (RHEUMATREX) 2.5 MG tablet Take 15 mg by mouth every Monday.     montelukast  (SINGULAIR ) 10 MG tablet Take 10 mg by mouth at bedtime.     nitroGLYCERIN  (NITROSTAT ) 0.4 MG SL tablet Place 1 tablet (0.4 mg total) under the tongue every 5 (five) minutes as needed for chest pain. 30 tablet 0   potassium chloride  SA (KLOR-CON  M) 20 MEQ tablet Take 1 tablet (20 mEq total) by mouth 2 (two) times daily. 14 tablet 0   predniSONE  (DELTASONE ) 5 MG tablet Take 5 mg by mouth daily with breakfast.     sacubitril -valsartan  (ENTRESTO ) 49-51 MG Take 1 tablet by mouth 2 (two) times daily. 180 tablet 3   No current facility-administered medications for this visit.    Review of Systems  Constitutional:  Positive for fatigue. Negative for appetite change, chills and fever.  HENT:   Negative for hearing loss and voice change.   Eyes:  Negative for eye problems.  Respiratory:  Negative for chest tightness, cough and shortness of breath.   Cardiovascular:  Negative for chest pain.  Gastrointestinal:  Negative for abdominal distention, abdominal pain and blood in stool.  Endocrine: Negative for hot flashes.  Genitourinary:  Negative for difficulty urinating and frequency.   Musculoskeletal:  Negative for arthralgias.  Skin:  Negative for itching and rash.  Neurological:  Negative for extremity weakness.  Hematological:  Negative for adenopathy.  Psychiatric/Behavioral:  Negative for confusion.      PHYSICAL EXAMINATION: Vitals:   09/08/24 1057  BP: (!) 133/57  Pulse: 66  Resp: 18  Temp: (!) 96.8 F (36 C)  SpO2: 96%   Filed Weights   09/08/24 1057  Weight: 183 lb 1.6 oz (83.1 kg)    Physical Exam Constitutional:      General: She is not in acute distress.    Appearance: She is not diaphoretic.  HENT:     Head:  Normocephalic and atraumatic.  Eyes:     General: No scleral icterus. Cardiovascular:     Rate and Rhythm: Normal rate.  Pulmonary:     Effort: Pulmonary effort is normal. No respiratory distress.     Breath sounds: Normal breath sounds.  Abdominal:     General: There is no distension.     Palpations: Abdomen is soft.  Musculoskeletal:        General: Normal range of motion.     Cervical back: Normal range of motion and neck supple.  Skin:    Findings: No erythema or rash.  Neurological:     Mental Status: She is alert and oriented to person, place, and time. Mental status is at baseline.     Motor: No abnormal muscle tone.  Psychiatric:        Mood and Affect: Mood and affect normal.      LABORATORY DATA:  I have reviewed the data as listed Lab Results  Component Value Date   WBC 3.7 (L) 09/08/2024   HGB 12.8 09/08/2024   HCT 38.9 09/08/2024   MCV 105.1 (H) 09/08/2024   PLT 175 09/08/2024   Recent Labs    03/06/24 1320 09/08/24 1050  NA 138 139  K 4.1 3.4*  CL 107 104  CO2 27 28  GLUCOSE 112* 124*  BUN 27* 18  CREATININE 1.11* 0.98  CALCIUM  8.5* 8.7*  GFRNONAA 50* 58*  PROT 6.5 6.4*  ALBUMIN 3.1* 3.5  AST 17 19  ALT 13 13  ALKPHOS 53 61  BILITOT 1.0 0.7    RADIOGRAPHIC STUDIES: I have personally reviewed the radiological images as listed and agreed with the findings in the report. No results found.

## 2024-09-09 ENCOUNTER — Ambulatory Visit: Payer: Self-pay | Admitting: Oncology

## 2024-09-09 NOTE — Addendum Note (Signed)
 Addended by: BABARA CALL on: 09/09/2024 10:34 PM   Modules accepted: Orders

## 2024-09-12 ENCOUNTER — Other Ambulatory Visit: Payer: Self-pay

## 2024-09-12 ENCOUNTER — Encounter (HOSPITAL_COMMUNITY): Payer: Self-pay | Admitting: Orthopedic Surgery

## 2024-09-12 NOTE — Progress Notes (Signed)
 SDW call  Patient was given pre-op instructions over the phone. Patient verbalized understanding of instructions provided.  Denies any SOB, fever or cough.  Patient states she has decided she wants surgery on her left hand instead of her right. [employer] sent to Dr. Camella.    PCP - Dr. Stefano Lease Cardiologist - Dr. Marsa Dooms Pulmonary:    PPM/ICD - denies Device Orders - na Rep Notified - na   Chest x-ray -  EKG -  12/28/2023 - [facility], [facility], requested Stress Test - ECHO - 11/11/2022 Cardiac Cath - 05/20/2008  Sleep Study/sleep apnea/CPAP+ sleep apnea, does not wear a CPAP  Non-diabetic  Blood Thinner Instructions: Eliquis , hold 3 days, last dose 09/12/2024 Aspirin  Instructions: denies   ERAS Protcol - Clears until 1300   Anesthesia review: Yes.  HF, OSA, CHF, CAD, HTN, MI  Your procedure is scheduled on Tuesday September 16, 2024  Report to [facility] Main Entrance A at  1330 pm., then check in with the Admitting office.  Call this number if you have problems the morning of surgery:  [phone]   If you have any questions prior to your surgery date call [phone]: Open Monday-Friday 8am-4pm If you experience any cold or flu symptoms such as cough, fever, chills, shortness of breath, etc. between now and your scheduled surgery, please notify us  at the above number     Remember:  Do not eat after midnight the night before your surgery  You may drink clear liquids until  1300 the day of your surgery.   Clear liquids allowed are: Water, Non-Citrus Juices (without pulp), Carbonated Beverages, Clear Tea, Black Coffee ONLY (NO MILK, CREAM OR POWDERED CREAMER of any kind), and Gatorade   Take these medicines the morning of surgery with A SIP OF WATER:  Atorvastatin , carvedilol , farxiga , flonase , isosorbide , prednison  As needed: Tylenol , albuterol   As of today, STOP taking any Aspirin  (unless otherwise instructed by your surgeon) Aleve, Naproxen, Ibuprofen ,  Motrin , Advil , Goody's, BC's, all herbal medications, fish oil, and all vitamins.

## 2024-09-15 NOTE — Anesthesia Preprocedure Evaluation (Signed)
 "                                  Anesthesia Evaluation  Patient identified by MRN, date of birth, ID band Patient awake    Reviewed: Allergy & Precautions, NPO status , Patient's Chart, lab work & pertinent test results  History of Anesthesia Complications Negative for: history of anesthetic complications  Airway Mallampati: III  TM Distance: >3 FB Neck ROM: Full    Dental  (+) Edentulous Upper, Dental Advisory Given   Pulmonary shortness of breath, asthma , sleep apnea and Oxygen sleep apnea , COPD, former smoker   breath sounds clear to auscultation       Cardiovascular hypertension, Pt. on medications and Pt. on home beta blockers + CAD, + Past MI and +CHF   Rhythm:Regular  1. Consider infiltrative process ie.SABRA amyloid /sarcoid.   2. Ischemic cardiomyopathy can't be excluded.   3. Severely depressed LVF worse since previous study 5/23.   4. Left ventricular ejection fraction, by estimation, is 20 to 25%. The  left ventricle has severely decreased function. The left ventricle  demonstrates global hypokinesis. The left ventricular internal cavity size  was mildly to moderately dilated. Left  ventricular diastolic parameters are consistent with Grade I diastolic  dysfunction (impaired relaxation).   5. Right ventricular systolic function is mildly reduced. The right  ventricular size is mildly enlarged. Mildly increased right ventricular  wall thickness.   6. Left atrial size was mildly dilated.   7. Right atrial size was mildly dilated.   8. The mitral valve is degenerative. Mild mitral valve regurgitation.   9. The aortic valve is calcified. Aortic valve regurgitation is trivial.  Aortic valve sclerosis/calcification is present, without any evidence of  aortic stenosis.     Neuro/Psych    GI/Hepatic Neg liver ROS,GERD  Controlled,,  Endo/Other  negative endocrine ROS    Renal/GU negative Renal ROSLab Results      Component                Value                Date                      NA                       139                 09/08/2024                K                        3.4 (L)             09/08/2024                CO2                      28                  09/08/2024                GLUCOSE                  124 (H)  09/08/2024                BUN                      18                  09/08/2024                CREATININE               0.98                09/08/2024                CALCIUM                   8.7 (L)             09/08/2024                EGFR                     48 (L)              08/27/2023                GFRNONAA                 58 (L)              09/08/2024                Musculoskeletal  (+) Arthritis ,    Abdominal   Peds  Hematology negative hematology ROS (+) Lab Results      Component                Value               Date                      WBC                      3.7 (L)             09/08/2024                HGB                      12.8                09/08/2024                HCT                      38.9                09/08/2024                MCV                      105.1 (H)           09/08/2024                PLT                      175                 09/08/2024  Anesthesia Other Findings 79 year old female follows with cardiology at [facility] clinic for history of combined heart failure, 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  Echo 11/2022 revealing LVEF 20-25%, RV systolic function mildly reduced, mild mitral regurgitation. Lexiscan  Myoview  11/2022 revealed anterolateral and inferolateral scar without ischemia in the absence of chest pain. Cardiac catheterization was deferred. Patient was seen at [facility] ED 07/13/2022 with right-sided pleuritic chest pain. CT scan showed resolution of right lung PE. Cardiac MRI 10/24/2023 revealed subendocardial late gadolinium enhancement basal mid LV lateral wall with severe global  hypokinesis suggestive of prior lateral wall infarct.  Last seen in follow-up by Dr. Ammon on 07/15/2024.  Per note, Patient returns today for follow-up, reports doing well. She denies chest pain. She has mild, chronic exertional shortness of breath. Oxygen saturation is 97% on room air. She denies palpitations or heart racing. She reports chronic peripheral edema. The patient is active but does not exercise regularly. The patient has essential hypertension, blood pressure well controlled, on carvedilol , Entresto , furosemide , and spironolactone , which are well-tolerated without apparent side effects.  No changes to management, 42-month follow-up recommended.  Cardiac clearance from Dr. Ammon dated 06/11/2024 states patient may hold Eliquis  3 days prior to procedure.   Follows with pulmonology at [facility] clinic for history of former smoker, COPD, OSA not on CPAP.  She is maintained on Symbicort, albuterol , supplemental O2 2 L with activity and at night.   Follows with rheumatology [facility] for history of rheumatoid arthritis, maintained on methotrexate , hydroxychloroquine , prednisone .   CMP and CBC 09/08/2024 reviewed, unremarkable.   EKG 12/28/2023 (copy scanned in media): Normal sinus rhythm.  Rate 63.  PAC.  Minimal voltage criteria for LVH, may be normal variant.  T wave abnormality, consider lateral ischemia.   Cardiac MRI 10/24/2023: IMPRESSION: 1. Normal LV size, severely reduced LV systolic function.  LVEF 23%.   2. There is severe global hypokinesis with akinesis of the LV lateral wall.   3. There is subendocardial (50-75%) late gadolinium enhancement in the basal-mid LV lateral wall.   4. Low normal RV systolic function.   5. Findings suggest prior lateral wall infarct.   Nuclear stress 11/10/2022:    Findings are consistent with infarction. The study is high risk.   No ST deviation was noted.   LV perfusion is abnormal. There is no evidence of ischemia. There is  evidence of infarction. Defect 1: There is a large defect with severe reduction in uptake present in the apical anterolateral and inferolateral location(s) that is fixed. There is abnormal wall motion in the defect area. Consistent with infarction and artifact.   Left ventricular function is abnormal. Nuclear stress EF: 22 %. The left ventricular ejection fraction is severely decreased (<30%). End diastolic cavity size is mildly enlarged. End systolic cavity size is mildly enlarged.   TTE 11/11/2022:  1. Consider infiltrative process ie.SABRA amyloid /sarcoid.   2. Ischemic cardiomyopathy can't be excluded.   3. Severely depressed LVF worse since previous study 5/23.   4. Left ventricular ejection fraction, by estimation, is 20 to 25%. The  left ventricle has severely decreased function. The left ventricle  demonstrates global hypokinesis. The left ventricular internal cavity size  was mildly to moderately dilated. Left  ventricular diastolic parameters are consistent with Grade I diastolic  dysfunction (impaired relaxation).   5. Right ventricular systolic function is mildly reduced. The right  ventricular size is mildly enlarged. Mildly increased right ventricular  wall thickness.  6. Left atrial size was mildly dilated.   7. Right atrial size was mildly dilated.   8. The mitral valve is degenerative. Mild mitral valve regurgitation.   9. The aortic valve is calcified. Aortic valve regurgitation is trivial.  Aortic valve sclerosis/calcification is present, without any evidence of  aortic stenosis.    Reproductive/Obstetrics                              Anesthesia Physical Anesthesia Plan  ASA: 4  Anesthesia Plan: MAC and Regional   Post-op Pain Management: Regional block*   Induction: Intravenous  PONV Risk Score and Plan: 2 and Treatment may vary due to age or medical condition  Airway Management Planned: Nasal Cannula, Natural Airway and Simple Face  Mask  Additional Equipment:   Intra-op Plan:   Post-operative Plan:   Informed Consent: I have reviewed the patients History and Physical, chart, labs and discussed the procedure including the risks, benefits and alternatives for the proposed anesthesia with the patient or authorized representative who has indicated his/her understanding and acceptance.     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  Echo 11/2022 revealing LVEF 20-25%, RV systolic function mildly reduced, mild mitral regurgitation. Lexiscan  Myoview  11/2022 revealed anterolateral and inferolateral scar without ischemia in the absence of chest pain. Cardiac catheterization was deferred. Patient was seen at [facility] ED 07/13/2022 with right-sided pleuritic chest pain. CT scan showed resolution of right lung PE. Cardiac MRI 10/24/2023 revealed subendocardial late gadolinium enhancement basal mid LV lateral wall with severe global hypokinesis suggestive of prior lateral wall infarct.  Last seen in follow-up by Dr. Ammon on 07/15/2024.  Per note, Patient returns today for follow-up, reports doing well. She denies chest pain. She has mild, chronic exertional shortness of breath. Oxygen saturation is 97% on room air. She denies palpitations or heart racing. She reports chronic peripheral edema. The patient is active but does not exercise regularly. The patient has essential hypertension, blood pressure well controlled, on carvedilol , Entresto , furosemide , and spironolactone , which are well-tolerated without apparent side effects.  No changes to management, 33-month follow-up recommended.  Cardiac clearance from Dr. Ammon dated 06/11/2024 states patient may hold Eliquis  3 days prior to  procedure.  Follows with pulmonology at [facility] clinic for history of former smoker, COPD, OSA not on CPAP.  She is maintained on Symbicort, albuterol , supplemental O2 2 L with activity and at night.  Follows with rheumatology [facility] for history of rheumatoid arthritis, maintained on methotrexate , hydroxychloroquine , prednisone .  CMP and CBC 09/08/2024 reviewed, unremarkable.  EKG 12/28/2023 (copy scanned in media): Normal sinus rhythm.  Rate 63.  PAC.  Minimal voltage criteria for LVH, may be normal variant.  T wave abnormality, consider lateral ischemia.  Cardiac MRI 10/24/2023: IMPRESSION: 1. Normal LV size, severely reduced LV systolic function.  LVEF 23%.   2. There is severe global hypokinesis with akinesis of the LV lateral wall.   3. There is subendocardial (50-75%) late gadolinium enhancement in the basal-mid LV lateral wall.   4. Low normal RV systolic function.   5. Findings suggest prior lateral wall infarct.  Nuclear stress 11/10/2022:    Findings are consistent with infarction. The study is high risk.  No ST deviation was noted.   LV perfusion is abnormal. There is no evidence of ischemia. There is evidence of infarction. Defect 1: There is a large defect with severe reduction in uptake present in the apical anterolateral and inferolateral location(s) that is fixed. There is abnormal wall motion in the defect area. Consistent with infarction and artifact.   Left ventricular function is abnormal. Nuclear stress EF: 22 %. The left ventricular ejection fraction is severely decreased (<30%). End diastolic cavity size is mildly enlarged. End systolic cavity size is mildly enlarged.  TTE 11/11/2022:  1. Consider infiltrative process ie.SABRA amyloid /sarcoid.   2. Ischemic cardiomyopathy can't be excluded.   3. Severely depressed LVF worse since previous study 5/23.   4. Left ventricular ejection fraction, by estimation, is 20 to 25%. The  left ventricle has severely  decreased function. The left ventricle  demonstrates global hypokinesis. The left ventricular internal cavity size  was mildly to moderately dilated. Left  ventricular diastolic parameters are consistent with Grade I diastolic  dysfunction (impaired relaxation).   5. Right ventricular systolic function is mildly reduced. The right  ventricular size is mildly enlarged. Mildly increased right ventricular  wall thickness.   6. Left atrial size was mildly dilated.   7. Right atrial size was mildly dilated.   8. The mitral valve is degenerative. Mild mitral valve regurgitation.   9. The aortic valve is calcified. Aortic valve regurgitation is trivial.  Aortic valve sclerosis/calcification is present, without any evidence of  aortic stenosis.   )         Anesthesia Quick Evaluation  "

## 2024-09-15 NOTE — Progress Notes (Signed)
 Heather from the OR desk aware of the pt not being able to arrive until 87554, due to arranged ride.

## 2024-09-15 NOTE — Progress Notes (Signed)
 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  Echo 11/2022 revealing LVEF 20-25%, RV systolic function mildly reduced, mild mitral regurgitation. Lexiscan  Myoview  11/2022 revealed anterolateral and inferolateral scar without ischemia in the absence of chest pain. Cardiac catheterization was deferred. Patient was seen at [facility] ED 07/13/2022 with right-sided pleuritic chest pain. CT scan showed resolution of right lung PE. Cardiac MRI 10/24/2023 revealed subendocardial late gadolinium enhancement basal mid LV lateral wall with severe global hypokinesis suggestive of prior lateral wall infarct.  Last seen in follow-up by Dr. Ammon on 07/15/2024.  Per note, Patient returns today for follow-up, reports doing well. She denies chest pain. She has mild, chronic exertional shortness of breath. Oxygen saturation is 97% on room air. She denies palpitations or heart racing. She reports chronic peripheral edema. The patient is active but does not exercise regularly. The patient has essential hypertension, blood pressure well controlled, on carvedilol , Entresto , furosemide , and spironolactone , which are well-tolerated without apparent side effects.  No changes to management, 61-month follow-up recommended.  Cardiac clearance from Dr. Ammon dated 06/11/2024 states patient may hold Eliquis  3 days prior to procedure.  Follows with pulmonology at [facility] clinic for history of former smoker, COPD, OSA not on CPAP.  She is maintained on Symbicort, albuterol , supplemental O2 2 L with activity and at night.  Follows with rheumatology [facility] for history of rheumatoid arthritis, maintained on methotrexate , hydroxychloroquine , prednisone .  CMP and CBC 09/08/2024 reviewed, unremarkable.  EKG 12/28/2023 (copy scanned in media): Normal  sinus rhythm.  Rate 63.  PAC.  Minimal voltage criteria for LVH, may be normal variant.  T wave abnormality, consider lateral ischemia.  Cardiac MRI 10/24/2023: IMPRESSION: 1. Normal LV size, severely reduced LV systolic function.  LVEF 23%.   2. There is severe global hypokinesis with akinesis of the LV lateral wall.   3. There is subendocardial (50-75%) late gadolinium enhancement in the basal-mid LV lateral wall.   4. Low normal RV systolic function.   5. Findings suggest prior lateral wall infarct.  Nuclear stress 11/10/2022:    Findings are consistent with infarction. The study is high risk.   No ST deviation was noted.   LV perfusion is abnormal. There is no evidence of ischemia. There is evidence of infarction. Defect 1: There is a large defect with severe reduction in uptake present in the apical anterolateral and inferolateral location(s) that is fixed. There is abnormal wall motion in the defect area. Consistent with infarction and artifact.   Left ventricular function is abnormal. Nuclear stress EF: 22 %. The left ventricular ejection fraction is severely decreased (<30%). End diastolic cavity size is mildly enlarged. End systolic cavity size is mildly enlarged.  TTE 11/11/2022:  1. Consider infiltrative process ie.SABRA amyloid /sarcoid.   2. Ischemic cardiomyopathy can't be excluded.   3. Severely depressed LVF worse since previous study 5/23.   4. Left ventricular ejection fraction, by estimation, is 20 to 25%. The  left ventricle has severely decreased function. The left ventricle  demonstrates global hypokinesis. The left ventricular internal cavity size  was mildly to moderately dilated. Left  ventricular diastolic parameters are consistent with Grade I diastolic  dysfunction (impaired relaxation).   5. Right ventricular systolic function is mildly reduced. The right  ventricular size is mildly enlarged. Mildly increased right ventricular  wall thickness.   6. Left  atrial  size was mildly dilated.   7. Right atrial size was mildly dilated.   8. The mitral valve is degenerative. Mild mitral valve regurgitation.   9. The aortic valve is calcified. Aortic valve regurgitation is trivial.  Aortic valve sclerosis/calcification is present, without any evidence of  aortic stenosis.      Lynwood Geofm RIGGERS [facility] Short Stay Center/Anesthesiology Phone [phone] 09/15/2024 10:35 AM

## 2024-09-15 NOTE — Progress Notes (Signed)
 Pt made aware of surgery time change for 09/16/24, 1451-1646, arrival 1220 (Pt states she has a ride, and will try to arrive by 1245). Pt told to stop eating soild food by midnight, and to stop drinking clear liquids by 1200, and to follow all other previous instructions given.

## 2024-09-16 ENCOUNTER — Ambulatory Visit (HOSPITAL_COMMUNITY)

## 2024-09-16 ENCOUNTER — Encounter (HOSPITAL_COMMUNITY): Admission: RE | Disposition: A | Payer: Self-pay | Source: Home / Self Care | Attending: Orthopedic Surgery

## 2024-09-16 ENCOUNTER — Ambulatory Visit (HOSPITAL_COMMUNITY): Admitting: Physician Assistant

## 2024-09-16 ENCOUNTER — Ambulatory Visit (HOSPITAL_COMMUNITY)
Admission: RE | Admit: 2024-09-16 | Discharge: 2024-09-16 | Disposition: A | Discharge status: Home / Self Care | Attending: Orthopedic Surgery | Admitting: Orthopedic Surgery

## 2024-09-16 ENCOUNTER — Encounter (HOSPITAL_COMMUNITY): Admitting: Physician Assistant

## 2024-09-16 ENCOUNTER — Other Ambulatory Visit: Payer: Self-pay

## 2024-09-16 ENCOUNTER — Encounter (HOSPITAL_COMMUNITY): Payer: Self-pay | Admitting: Orthopedic Surgery

## 2024-09-16 DIAGNOSIS — K219 Gastro-esophageal reflux disease without esophagitis: Secondary | ICD-10-CM | POA: Diagnosis not present

## 2024-09-16 DIAGNOSIS — I5022 Chronic systolic (congestive) heart failure: Secondary | ICD-10-CM | POA: Insufficient documentation

## 2024-09-16 DIAGNOSIS — Z7901 Long term (current) use of anticoagulants: Secondary | ICD-10-CM | POA: Diagnosis not present

## 2024-09-16 DIAGNOSIS — X58XXXA Exposure to other specified factors, initial encounter: Secondary | ICD-10-CM | POA: Insufficient documentation

## 2024-09-16 DIAGNOSIS — Z79899 Other long term (current) drug therapy: Secondary | ICD-10-CM | POA: Insufficient documentation

## 2024-09-16 DIAGNOSIS — J45909 Unspecified asthma, uncomplicated: Secondary | ICD-10-CM | POA: Insufficient documentation

## 2024-09-16 DIAGNOSIS — G4733 Obstructive sleep apnea (adult) (pediatric): Secondary | ICD-10-CM | POA: Diagnosis not present

## 2024-09-16 DIAGNOSIS — M069 Rheumatoid arthritis, unspecified: Secondary | ICD-10-CM

## 2024-09-16 DIAGNOSIS — I2510 Atherosclerotic heart disease of native coronary artery without angina pectoris: Secondary | ICD-10-CM | POA: Diagnosis not present

## 2024-09-16 DIAGNOSIS — Z87891 Personal history of nicotine dependence: Secondary | ICD-10-CM | POA: Diagnosis not present

## 2024-09-16 DIAGNOSIS — S63269A Dislocation of metacarpophalangeal joint of unspecified finger, initial encounter: Secondary | ICD-10-CM | POA: Insufficient documentation

## 2024-09-16 DIAGNOSIS — I110 Hypertensive heart disease with heart failure: Secondary | ICD-10-CM | POA: Insufficient documentation

## 2024-09-16 DIAGNOSIS — I080 Rheumatic disorders of both mitral and aortic valves: Secondary | ICD-10-CM | POA: Insufficient documentation

## 2024-09-16 DIAGNOSIS — I252 Old myocardial infarction: Secondary | ICD-10-CM | POA: Diagnosis not present

## 2024-09-16 HISTORY — PX: FINGER ARTHROPLASTY: SHX5017

## 2024-09-16 HISTORY — DX: Prediabetes: R73.03

## 2024-09-16 LAB — SURGICAL PCR SCREEN
MRSA, PCR: NEGATIVE
Staphylococcus aureus: NEGATIVE

## 2024-09-16 MED ORDER — FENTANYL CITRATE (PF) 100 MCG/2ML IJ SOLN: 50.0000 ug | Freq: Once | INTRAMUSCULAR | Status: AC

## 2024-09-16 MED ORDER — LIDOCAINE-EPINEPHRINE (PF) 1.5 %-1:200000 IJ SOLN
INTRAMUSCULAR | Status: DC | PRN
  Administered 2024-09-16: 5 mL via PERINEURAL

## 2024-09-16 MED ORDER — BUPIVACAINE HCL (PF) 0.25 % IJ SOLN: INTRAMUSCULAR | Status: DC | PRN

## 2024-09-16 MED ORDER — DEXAMETHASONE SOD PHOSPHATE PF 10 MG/ML IJ SOLN
INTRAMUSCULAR | Status: DC | PRN
  Administered 2024-09-16: 10 mg via INTRAVENOUS

## 2024-09-16 MED ORDER — BUPIVACAINE-EPINEPHRINE (PF) 0.5% -1:200000 IJ SOLN
INTRAMUSCULAR | Status: DC | PRN
  Administered 2024-09-16: 25 mL via PERINEURAL

## 2024-09-16 MED ORDER — 0.9 % SODIUM CHLORIDE (POUR BTL) OPTIME
TOPICAL | Status: DC | PRN
  Administered 2024-09-16: 1000 mL

## 2024-09-16 MED ORDER — MIDAZOLAM HCL 2 MG/2ML IJ SOLN
INTRAMUSCULAR | Status: AC
  Filled 2024-09-16: qty 2

## 2024-09-16 MED ORDER — PROPOFOL 10 MG/ML IV BOLUS
INTRAVENOUS | Status: AC
  Filled 2024-09-16: qty 20

## 2024-09-16 MED ORDER — DEXMEDETOMIDINE HCL IN NACL 200 MCG/50ML IV SOLN
INTRAVENOUS | Status: DC | PRN
  Administered 2024-09-16: .7 ug/kg/h via INTRAVENOUS

## 2024-09-16 MED ORDER — LACTATED RINGERS IV SOLN: INTRAVENOUS | Status: DC

## 2024-09-16 MED ORDER — FENTANYL CITRATE (PF) 100 MCG/2ML IJ SOLN
INTRAMUSCULAR | Status: AC
  Administered 2024-09-16: 50 ug via INTRAVENOUS
  Filled 2024-09-16: qty 2

## 2024-09-16 MED ORDER — PROPOFOL 10 MG/ML IV BOLUS
INTRAVENOUS | Status: DC | PRN
  Administered 2024-09-16: 20 mg via INTRAVENOUS
  Administered 2024-09-16: 30 mg via INTRAVENOUS
  Administered 2024-09-16: 20 mg via INTRAVENOUS
  Administered 2024-09-16: 30 mg via INTRAVENOUS

## 2024-09-16 MED ORDER — BUPIVACAINE HCL (PF) 0.25 % IJ SOLN
INTRAMUSCULAR | Status: AC
  Filled 2024-09-16: qty 30

## 2024-09-16 MED ORDER — CHLORHEXIDINE GLUCONATE 0.12 % MT SOLN
15.0000 mL | Freq: Once | OROMUCOSAL | Status: AC
  Administered 2024-09-16: 15 mL via OROMUCOSAL
  Filled 2024-09-16: qty 15

## 2024-09-16 MED ORDER — ONDANSETRON HCL 4 MG/2ML IJ SOLN
INTRAMUSCULAR | Status: DC | PRN
  Administered 2024-09-16: 4 mg via INTRAVENOUS

## 2024-09-16 MED ORDER — CEFAZOLIN SODIUM-DEXTROSE 2-4 GM/100ML-% IV SOLN
2.0000 g | INTRAVENOUS | Status: AC
  Administered 2024-09-16: 2 g via INTRAVENOUS
  Filled 2024-09-16: qty 100

## 2024-09-16 MED ORDER — ORAL CARE MOUTH RINSE: 15.0000 mL | Freq: Once | OROMUCOSAL | Status: AC

## 2024-09-16 NOTE — Transfer of Care (Signed)
 Immediate Anesthesia Transfer of Care Note  Patient: Tina Fields  Procedure(s) Performed: RIGHT INDEX, MIDDLE, RING AND SMALL FINGER METACARPAL PHALANGEAL ARTHROPLASTY WITH EXTENSOR TENDON REALIGNMENT AND REPAIR RECONSTRUCTION (Left: Finger)  Patient Location: PACU  Anesthesia Type:MAC and Regional  Level of Consciousness: patient cooperative and responds to stimulation  Airway & Oxygen Therapy: Patient Spontanous Breathing. O2 sat 88-90% on RA. 8LFM applied per RN  Post-op Assessment: Report given to RN  Post vital signs: Reviewed and stable  Last Vitals:  Vitals Value Taken Time  BP 144/59 09/16/24 17:08  Temp 36.8 C 09/16/24 17:08  Pulse 63 09/16/24 17:11  Resp 32 09/16/24 17:11  SpO2 92 % 09/16/24 17:11  Vitals shown include unfiled device data.  Last Pain:  Vitals:   09/16/24 1435  TempSrc:   PainSc: 0-No pain         Complications: There were no known notable events for this encounter.

## 2024-09-16 NOTE — Discharge Instructions (Signed)
 ELEVATE as best you can   Call Dr Camella if you have any problems Cell phone [phone]  We recommend that you to take vitamin C 1000 mg a day to promote healing. We also recommend that if you require  pain medicine that you take a stool softener to prevent constipation as most pain medicines will have constipation side effects. We recommend either Peri-Colace or Senokot and recommend that you also consider adding MiraLAX  as well to prevent the constipation affects from pain medicine if you are required to use them. These medicines are over the counter and may be purchased at a local pharmacy. A cup of yogurt and a probiotic can also be helpful during the recovery process as the medicines can disrupt your intestinal environment.Keep bandage clean and dry.  Call for any problems.  No smoking.  Criteria for driving a car: you should be off your pain medicine for 7-8 hours, able to drive one handed(confident), thinking clearly and feeling able in your judgement to drive. Continue elevation as it will decrease swelling.  If instructed by MD move your fingers within the confines of the bandage/splint.  Use ice if instructed by your MD. Call immediately for any sudden loss of feeling in your hand/arm or change in functional abilities of the extremity.

## 2024-09-16 NOTE — Anesthesia Procedure Notes (Addendum)
 Anesthesia Regional Block: Supraclavicular block   Pre-Anesthetic Checklist: , timeout performed,  Correct Patient, Correct Site, Correct Laterality,  Correct Procedure, Correct Position, site marked,  Risks and benefits discussed,  Surgical consent,  Pre-op evaluation,  At surgeon's request and post-op pain management  Laterality: Left and Upper  Prep: chloraprep       Needles:  Injection technique: Single-shot      Needle Length: 5cm  Needle Gauge: 22     Additional Needles: Arrow StimuQuik ECHO Echogenic Stimulating PNB Needle  Procedures:,,,, ultrasound used (permanent image in chart),,    Narrative:  Start time: 09/16/2024 2:22 PM End time: 09/16/2024 2:30 PM Injection made incrementally with aspirations every 5 mL.  Performed by: Personally  Anesthesiologist: Leopoldo Bruckner, MD

## 2024-09-16 NOTE — Op Note (Addendum)
 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  #2 left index finger MCP arthroplasty size 20 silicone from [employer] #3 left index finger stress radiography #4 volar plate release left index finger MCP joint #5 left middle finger extensor tendon realignment with ulnar intrinsic release #6 left middle finger MCP arthroplasty size 30 silicone from [employer] #7 left middle finger stress radiography #8 volar plate release left middle finger MCP joint #9 left ring finger extensor tendon realignment with ulnar intrinsic release #10 left ring finger MCP arthroplasty size 20 #11 left ring finger stress radiography #12 volar plate release left ring finger MCP joint #13 left small finger extensor tendon realignment with ulnar intrinsic release #14 left small finger MCP arthroplasty size 20 [employer] silicone implant #15 left small finger stress radiography #16 left small finger volar plate release  Surgeon Tina Fields  Assistants none  Anesthesia Block with IV sedation  Estimated blood loss minimal  Tourniquet time less than 2 hours  Indications for the procedure: Patient is a 79 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  She presents for surgical reconstruction.  I have counseled regards to risk benefits.  She has now standing contracture with ulnar drift and subluxation dislocation of the MCP joints.  Operative procedure in detail.  Patient was taken to the operative theater.  She underwent IV sedation.  She was prepped with 2 Hibiclens  scrubs followed by Betadine scrub and paint by myself body parts were well-padded and she was carefully  protected and positioned at all times.  Preoperative antibiotics were given.  Following this the arm was elevated and timeout was observed and operation commenced with evaluation under anesthesia followed by incision dorsally in a longitudinal fashion about the index finger.  Dissection was carried down.  Extensor apparatus was identified.  Ulnar sagittal band was contracted and completely dislocated.  I released the ulnar sagittal band and swept the extensor tendon dorsally until I could expose the MCP joint.  At this time I then performed a ulna intrinsic release.  This allowed for release of the contracted joint.  Following this we then performed very careful and cautious dissection and capsular opening.  Identified the radial collateral ligament and placed 2 FiberWire sutures about the collateral ligament that exited and anterior at the metacarpal flare.  This was an effort to perform a radial collateral ligament repair and stabilize the construct.  I then identified the joint.  I performed opening of the joint followed by saw cut about the metacarpal head and minimal Sarka at about the proximal phalanx according to standard operating technique with the [employer] silicone implant.  I broached distally followed by proximally to a size 20.  At this time I then performed a volar plate release.  The volar plate was released with combination right angle hemostat and direct vision and knife blade.  Volar plate release, intrinsic release, and preparation of the joint ensued followed by temporary evaluation with the size 20 silicone hand trial.  Following this we then implanted with no touch technique the size 20 silicone MCP arthroplasty from [employer].  I then performed capsular closure and radial collateral ligament repair with FiberWire suture.  I then performed a extensor tendon realignment procedure with combination  of FiberWire and Vicryl suture.  Following this we irrigated copiously as we did multiple  times during the surgical dissection.  We then tested the construct about the extensor apparatus there was no subluxation and all looked very well with excellent tracking.  Following this we ultimately closed the wound with Prolene.  Next we turned attention towards middle finger similar longitudinal incision was made dissection was carried down extensor apparatus was identified.  The ulnar sagittal band was released.  The extensor apparatus was swept radially.  We then performed ulnar intrinsic release with right angle hemostat sharp knife blade and orthopedic instrument.  We then formed capsular opening.  I then prepared the joint with the [employer] device for cutting.  The saw cut was placed about the metacarpal head followed by minimal cutting about the proximal phalanx followed by broaching and sizing to a size 30.  At this juncture I then performed identification of the volar plate and then performed a volar plate release.  Patient tolerated this well.  Following this a size 30 trial was placed followed by placement of the final prosthesis which was a size 30 silicone implant.  Patient tolerated this well.  We performed irrigation followed by capsular closure and extensor tendon realignment with Vicryl and FiberWire.  The radial sagittal band was reefed nicely so as to make sure tracking was excellent.  The ulnar sagittal band was left released and the tendon tracking looked excellent.  Thus a intrinsic release, volar plate release, extensor realignment and MCP arthroplasty was accomplished.  Following this we then irrigated additionally and closed the skin edges with Prolene at the conclusion of the case with hemostasis secured.  Following this attention was turned towards the ring finger where a similar longitudinal incision was made.  Dissection was carried down and the ulna sagittal band was released followed by sweeping the extensor apparatus radially identifying the joint and opening the  capsule as well as a intrinsic release.  I should note the intrinsic release was accomplished about all fingers due to the chronic contracture and ulnar deviation.  A small portion of the intrinsic/lateral band area ulnarly was released taking care to avoid injury to the digital artery just underneath this.  Following this we repaired the joint with oscillating saw followed by broaching to a size 20.  At this time I then performed volar plate release with combination right angle hemostat.  This allowed joint space to my satisfaction.  I performed a trial with a size 20 followed by placement of the size 20 no touch technique silicone implant.  Irrigation was applied throughout the procedure.  Following this we tested the construct and performed capsular closure with Vicryl followed by extensor tendon realignment with radial sagittal band reefing and repair.  Thus a extensor realignment, intrinsic release, volar plate release and size 20 MCP arthroplasty was accomplished about the ring finger.  Following this the final digit was addressed.  Small finger longitudinal incision was made dissection was carried down ulnar sagittal band was released radial retraction of the extensor apparatus ensued.  We then performed intrinsic release ulnarly followed by capsular opening.  We then performed soft cuts about the proximal phalanx and metacarpal head followed by placement of the awl and broach.  We broached up to a size 10 and implanted the trial.  Prior to doing so we performed a volar plate release with right angle hemostat utilizing the knife and orthopedic instruments.  This allowed for a nice solid fit for the  prosthesis.  I was pleased with this and the findings.  Following the sizing we then implanted the final implant this was a size 10 [employer] silicone orthopedic implant which patient tolerated this well.  Following this we then irrigated copiously.  We closed the capsule followed by extensor tendon  realignment with combination FiberWire and a Vicryl suture.  I should note all of the extensor realignment's were tested with flexion and extension and looked excellent.  Once again, I should note that intrinsic releases were performed about the index middle ring and small fingers as was volar plate releases and MCP arthroplasties and extensor realignment's which were tested and looked to be excellent.  At the conclusion of the case we made sure all wounds were closed with Prolene.  Adaptic Xeroform gauze and a volar splint was applied.  I will plan to keep her in a splint device for up to 6 weeks cast versus splint and then at 6 weeks begin therapy for nighttime splinting and daytime splinting with interval range of motion.  I will likely splint her at night for up to 5 months postop and at 3 months discontinue daytime splinting.  This will be predicated on her progress.  I discussed all issues with her family.  She tolerated the procedure well she was awake alert and oriented in the recovery room.  She had no complicating features.  She had excellent refill.  Hemostasis was excellent.  She will begin her blood thinners Friday.  I have discussed all issues with the family.  Tina Mussel, MD

## 2024-09-16 NOTE — H&P (Signed)
 Tina Fields is an 79 y.o. female.   Chief Complaint: Patient presents for left hand reconstruction HPI: Patient presents for evaluation and treatment of the of their upper extremity predicament. The patient denies neck, back, chest or  abdominal pain. The patient notes that they have no lower extremity problems. The patients primary complaint is noted. We are planning surgical care pathway for the upper extremity.   Past Medical History:  Diagnosis Date   Aortic atherosclerosis    Asthma    Chest pain 11/10/2022   CHF (congestive heart failure) (HCC)    Coronary artery disease    Dyspnea    Edema of both legs    GERD (gastroesophageal reflux disease)    HFrEF (heart failure with reduced ejection fraction) (HCC)    History of 2019 novel coronavirus disease (COVID-19) 10/28/2020   Hypertension    Myocardial infarction (HCC)    OSA (obstructive sleep apnea)    noncompliant with nocturnal PAP therapy   Pre-diabetes    RA (rheumatoid arthritis) (HCC)     Past Surgical History:  Procedure Laterality Date   BREAST SURGERY     CARDIAC CATHETERIZATION Left 06/21/2005   Moderate ostial LCx and LAD disease; LVEF 45%; Location: [facility]; Surgeon: Denyse Bathe, MD   CARDIAC CATHETERIZATION Left 05/20/2008   No occlusive CAD; LVEF 65%; Location: [facility]; Surgeon: Margie Lovelace, MD   CHOLECYSTECTOMY     COLONOSCOPY     ESOPHAGOGASTRODUODENOSCOPY     TUBAL LIGATION      Family History  Problem Relation Age of Onset   CAD Mother    Diabetes Mother    Hypertension Mother    CAD Father    Diabetes Father    Hypertension Father    Stroke Father    Social History:  reports that she quit smoking about 29 years ago. Her smoking use included cigarettes. She has never used smokeless tobacco. She reports that she does not drink alcohol and does not use drugs.  Allergies: Allergies[1]  No medications prior to admission.    No results found for this or any previous visit (from the  past 48 hours). No results found.  Review of Systems  Height 5' 6 (1.676 m), weight 81.2 kg. Physical Exam Bilateral Rheum arthritis hand deformity with MCP sublux/dislocation and chronic pain and extensor tendon sublux The patient is alert and oriented in no acute distress. The patient complains of pain in the affected upper extremity.  The patient is noted to have a normal HEENT exam. Lung fields show equal chest expansion and no shortness of breath. Abdomen exam is nontender without distention. Lower extremity examination does not show any fracture dislocation or blood clot symptoms. Pelvis is stable and the neck and back are stable and nontender.  Assessment/Plan Plan left hand rheumatoid reconstruction with MCP Aplasty and extensor realignment as necessary  We are planning surgery for your upper extremity. The risk and benefits of surgery to include risk of bleeding, infection, anesthesia,  damage to normal structures and failure of the surgery to accomplish its intended goals of relieving symptoms and restoring function have been discussed in detail. With this in mind we plan to proceed. I have specifically discussed with the patient the pre-and postoperative regime and the dos and don'ts and risk and benefits in great detail. Risk and benefits of surgery also include risk of dystrophy(CRPS), chronic nerve pain, failure of the healing process to go onto completion and other inherent risks of surgery The relavent the pathophysiology of  the disease/injury process, as well as the alternatives for treatment and postoperative course of action has been discussed in great detail with the patient who desires to proceed.  We will do everything in our power to help you (the patient) restore function to the upper extremity. It is a pleasure to see this patient today.   Elsie CHRISTELLA Mussel III, MD 09/16/2024, 12:17 PM       [1]  Allergies Allergen Reactions   Ace Inhibitors Cough    Other  reaction(s): Cough    Hydrochlorothiazide Other (See Comments)    Cramps   Latex Hives   Azithromycin Rash   Jardiance  [Empagliflozin ] Itching

## 2024-09-17 NOTE — Anesthesia Postprocedure Evaluation (Signed)
"   Anesthesia Post Note  Patient: Tina Fields  Procedure(s) Performed: RIGHT INDEX, MIDDLE, RING AND SMALL FINGER METACARPAL PHALANGEAL ARTHROPLASTY WITH EXTENSOR TENDON REALIGNMENT AND REPAIR RECONSTRUCTION (Left: Finger)     Patient location during evaluation: PACU Anesthesia Type: Regional and MAC Level of consciousness: awake and alert Pain management: pain level controlled Vital Signs Assessment: post-procedure vital signs reviewed and stable Respiratory status: spontaneous breathing, nonlabored ventilation and respiratory function stable Cardiovascular status: stable and blood pressure returned to baseline Postop Assessment: no apparent nausea or vomiting Anesthetic complications: no   There were no known notable events for this encounter.  Last Vitals:  Vitals:   09/16/24 1730 09/16/24 1745  BP: (!) 143/58 (!) 136/59  Pulse: 64 62  Resp: (!) 28 (!) 23  Temp:  36.8 C  SpO2: 98% 98%    Last Pain:  Vitals:   09/16/24 1708  TempSrc:   PainSc: 0-No pain                 Razi Hickle      "

## 2024-09-22 ENCOUNTER — Encounter (HOSPITAL_COMMUNITY): Payer: Self-pay | Admitting: Orthopedic Surgery

## 2025-03-09 ENCOUNTER — Inpatient Hospital Stay

## 2025-03-09 ENCOUNTER — Inpatient Hospital Stay: Admitting: Oncology
